# Patient Record
Sex: Male | Born: 1965 | Race: Black or African American | Hispanic: No | Marital: Single | Smoking: Former smoker
Health system: Southern US, Community
[De-identification: ages and names within clinical notes are randomized; demographics above are authoritative.]

## PROBLEM LIST (undated history)

## (undated) DIAGNOSIS — R358 Other polyuria: Principal | ICD-10-CM

## (undated) DIAGNOSIS — E1165 Type 2 diabetes mellitus with hyperglycemia: Secondary | ICD-10-CM

## (undated) DIAGNOSIS — B20 Human immunodeficiency virus [HIV] disease: Secondary | ICD-10-CM

## (undated) DIAGNOSIS — I10 Essential (primary) hypertension: Secondary | ICD-10-CM

## (undated) DIAGNOSIS — R42 Dizziness and giddiness: Secondary | ICD-10-CM

## (undated) DIAGNOSIS — H6690 Otitis media, unspecified, unspecified ear: Secondary | ICD-10-CM

## (undated) DIAGNOSIS — E119 Type 2 diabetes mellitus without complications: Secondary | ICD-10-CM

## (undated) DIAGNOSIS — T7840XA Allergy, unspecified, initial encounter: Secondary | ICD-10-CM

## (undated) DIAGNOSIS — R6889 Other general symptoms and signs: Secondary | ICD-10-CM

## (undated) DIAGNOSIS — R634 Abnormal weight loss: Secondary | ICD-10-CM

## (undated) DIAGNOSIS — G969 Disorder of central nervous system, unspecified: Secondary | ICD-10-CM

## (undated) HISTORY — DX: Human immunodeficiency virus (HIV) disease: B20

## (undated) HISTORY — PX: OTHER SURGICAL HISTORY: SHX169

## (undated) HISTORY — DX: Dizziness and giddiness: R42

## (undated) HISTORY — DX: Otitis media, unspecified, unspecified ear: H66.90

## (undated) HISTORY — DX: Essential (primary) hypertension: I10

## (undated) HISTORY — DX: Other general symptoms and signs: R68.89

## (undated) HISTORY — DX: Type 2 diabetes mellitus with hyperglycemia: E11.65

## (undated) HISTORY — DX: Type 2 diabetes mellitus without complications: E11.9

## (undated) HISTORY — DX: Disorder of central nervous system, unspecified: G96.9

## (undated) HISTORY — DX: Other polyuria: R35.8

## (undated) HISTORY — DX: Allergy, unspecified, initial encounter: T78.40XA

## (undated) HISTORY — PX: CARPAL TUNNEL RELEASE: SHX101

## (undated) HISTORY — DX: Abnormal weight loss: R63.4

---

## 1998-11-29 ENCOUNTER — Emergency Department (HOSPITAL_COMMUNITY): Admission: EM | Admit: 1998-11-29 | Discharge: 1998-11-29 | Payer: Self-pay | Admitting: Emergency Medicine

## 1999-05-14 ENCOUNTER — Emergency Department (HOSPITAL_COMMUNITY): Admission: EM | Admit: 1999-05-14 | Discharge: 1999-05-14 | Payer: Self-pay | Admitting: *Deleted

## 2004-12-18 ENCOUNTER — Emergency Department (HOSPITAL_COMMUNITY): Admission: EM | Admit: 2004-12-18 | Discharge: 2004-12-18 | Payer: Self-pay | Admitting: Emergency Medicine

## 2005-11-24 ENCOUNTER — Emergency Department (HOSPITAL_COMMUNITY): Admission: EM | Admit: 2005-11-24 | Discharge: 2005-11-24 | Payer: Self-pay | Admitting: Emergency Medicine

## 2007-07-30 ENCOUNTER — Emergency Department (HOSPITAL_COMMUNITY): Admission: EM | Admit: 2007-07-30 | Discharge: 2007-07-30 | Payer: Self-pay | Admitting: Emergency Medicine

## 2007-10-02 DIAGNOSIS — Z21 Asymptomatic human immunodeficiency virus [HIV] infection status: Secondary | ICD-10-CM

## 2007-10-02 DIAGNOSIS — I10 Essential (primary) hypertension: Secondary | ICD-10-CM

## 2007-10-02 DIAGNOSIS — B20 Human immunodeficiency virus [HIV] disease: Secondary | ICD-10-CM

## 2007-10-02 HISTORY — DX: Human immunodeficiency virus (HIV) disease: B20

## 2007-10-02 HISTORY — DX: Essential (primary) hypertension: I10

## 2007-10-02 HISTORY — DX: Asymptomatic human immunodeficiency virus (hiv) infection status: Z21

## 2007-11-26 ENCOUNTER — Emergency Department (HOSPITAL_COMMUNITY): Admission: EM | Admit: 2007-11-26 | Discharge: 2007-11-26 | Payer: Self-pay | Admitting: Family Medicine

## 2008-03-06 ENCOUNTER — Emergency Department (HOSPITAL_COMMUNITY): Admission: EM | Admit: 2008-03-06 | Discharge: 2008-03-06 | Payer: Self-pay | Admitting: Emergency Medicine

## 2008-03-09 ENCOUNTER — Emergency Department (HOSPITAL_COMMUNITY): Admission: EM | Admit: 2008-03-09 | Discharge: 2008-03-10 | Payer: Self-pay | Admitting: Emergency Medicine

## 2008-03-12 DIAGNOSIS — N179 Acute kidney failure, unspecified: Secondary | ICD-10-CM | POA: Insufficient documentation

## 2008-03-13 ENCOUNTER — Inpatient Hospital Stay (HOSPITAL_COMMUNITY): Admission: EM | Admit: 2008-03-13 | Discharge: 2008-03-26 | Payer: Self-pay | Admitting: Emergency Medicine

## 2008-03-13 ENCOUNTER — Ambulatory Visit: Payer: Self-pay | Admitting: Infectious Diseases

## 2008-03-13 ENCOUNTER — Encounter: Payer: Self-pay | Admitting: Infectious Diseases

## 2008-03-13 DIAGNOSIS — E8770 Fluid overload, unspecified: Secondary | ICD-10-CM | POA: Insufficient documentation

## 2008-03-13 DIAGNOSIS — J189 Pneumonia, unspecified organism: Secondary | ICD-10-CM | POA: Insufficient documentation

## 2008-03-13 DIAGNOSIS — E871 Hypo-osmolality and hyponatremia: Secondary | ICD-10-CM | POA: Insufficient documentation

## 2008-03-13 LAB — CONVERTED CEMR LAB: HIV: REACTIVE

## 2008-03-14 LAB — CONVERTED CEMR LAB: CD4 Count: 20 microliters

## 2008-03-24 ENCOUNTER — Ambulatory Visit: Payer: Self-pay | Admitting: Oncology

## 2008-03-25 LAB — CONVERTED CEMR LAB: HIV 1 RNA Quant: 969000 copies/mL

## 2008-03-26 LAB — CONVERTED CEMR LAB
GC Probe Amp, Urine: NEGATIVE
HCV Ab: NEGATIVE

## 2008-04-04 ENCOUNTER — Emergency Department (HOSPITAL_COMMUNITY): Admission: EM | Admit: 2008-04-04 | Discharge: 2008-04-05 | Payer: Self-pay | Admitting: Emergency Medicine

## 2008-04-07 ENCOUNTER — Encounter (INDEPENDENT_AMBULATORY_CARE_PROVIDER_SITE_OTHER): Payer: Self-pay | Admitting: *Deleted

## 2008-04-08 ENCOUNTER — Ambulatory Visit: Payer: Self-pay | Admitting: Infectious Disease

## 2008-04-08 LAB — CONVERTED CEMR LAB: CD4 T Cell Abs: 20

## 2008-04-15 DIAGNOSIS — B20 Human immunodeficiency virus [HIV] disease: Secondary | ICD-10-CM | POA: Insufficient documentation

## 2008-04-19 ENCOUNTER — Encounter: Payer: Self-pay | Admitting: Infectious Diseases

## 2008-04-20 ENCOUNTER — Ambulatory Visit: Payer: Self-pay | Admitting: Infectious Diseases

## 2008-04-20 DIAGNOSIS — I10 Essential (primary) hypertension: Secondary | ICD-10-CM

## 2008-04-20 DIAGNOSIS — I152 Hypertension secondary to endocrine disorders: Secondary | ICD-10-CM | POA: Insufficient documentation

## 2008-04-20 DIAGNOSIS — D709 Neutropenia, unspecified: Secondary | ICD-10-CM | POA: Insufficient documentation

## 2008-04-20 DIAGNOSIS — E1159 Type 2 diabetes mellitus with other circulatory complications: Secondary | ICD-10-CM

## 2008-04-20 LAB — CONVERTED CEMR LAB
ALT: 13 units/L (ref 0–53)
AST: 19 units/L (ref 0–37)
Albumin: 3.9 g/dL (ref 3.5–5.2)
BUN: 7 mg/dL (ref 6–23)
CO2: 24 meq/L (ref 19–32)
Calcium: 9.1 mg/dL (ref 8.4–10.5)
Chloride: 103 meq/L (ref 96–112)
Creatinine, Ser: 1.04 mg/dL (ref 0.40–1.50)
Hemoglobin: 11.6 g/dL — ABNORMAL LOW (ref 13.0–17.0)
Hep A Total Ab: NEGATIVE
Lymphocytes Relative: 29 % (ref 12–46)
Lymphs Abs: 0.6 10*3/uL — ABNORMAL LOW (ref 0.7–4.0)
Monocytes Absolute: 0.4 10*3/uL (ref 0.1–1.0)
Monocytes Relative: 19 % — ABNORMAL HIGH (ref 3–12)
Neutro Abs: 0.6 10*3/uL — ABNORMAL LOW (ref 1.7–7.7)
Neutrophils Relative %: 34 % — ABNORMAL LOW (ref 43–77)
Potassium: 4.3 meq/L (ref 3.5–5.3)
RBC: 4.28 M/uL (ref 4.22–5.81)
WBC: 1.9 10*3/uL — ABNORMAL LOW (ref 4.0–10.5)

## 2008-04-23 ENCOUNTER — Telehealth (INDEPENDENT_AMBULATORY_CARE_PROVIDER_SITE_OTHER): Payer: Self-pay | Admitting: *Deleted

## 2008-04-27 ENCOUNTER — Ambulatory Visit: Payer: Self-pay | Admitting: Infectious Diseases

## 2008-04-27 LAB — CONVERTED CEMR LAB
ALT: 10 units/L (ref 0–53)
BUN: 5 mg/dL — ABNORMAL LOW (ref 6–23)
Bilirubin, Direct: 0.1 mg/dL (ref 0.0–0.3)
CD4 Count: 33 microliters
Chloride: 105 meq/L (ref 96–112)
Creatinine, Ser: 0.85 mg/dL (ref 0.40–1.50)
HIV-1 RNA Quant, Log: 5.8 — ABNORMAL HIGH (ref <1.70)
Hep B S Ab: NEGATIVE
Indirect Bilirubin: 0.5 mg/dL (ref 0.0–0.9)
Phosphorus: 4.1 mg/dL (ref 2.3–4.6)
Potassium: 4.1 meq/L (ref 3.5–5.3)
Total Bilirubin: 0.6 mg/dL (ref 0.3–1.2)

## 2008-05-05 ENCOUNTER — Observation Stay (HOSPITAL_COMMUNITY): Admission: EM | Admit: 2008-05-05 | Discharge: 2008-05-07 | Payer: Self-pay | Admitting: Emergency Medicine

## 2008-05-05 ENCOUNTER — Telehealth: Payer: Self-pay | Admitting: Infectious Diseases

## 2008-05-05 ENCOUNTER — Ambulatory Visit: Payer: Self-pay | Admitting: Internal Medicine

## 2008-05-06 ENCOUNTER — Ambulatory Visit: Payer: Self-pay | Admitting: Internal Medicine

## 2008-05-12 ENCOUNTER — Ambulatory Visit: Payer: Self-pay | Admitting: Infectious Diseases

## 2008-05-12 LAB — CONVERTED CEMR LAB
CD4 Count: 30 microliters
Calcium: 9.2 mg/dL (ref 8.4–10.5)
Cholesterol: 158 mg/dL (ref 0–200)
Glucose, Bld: 89 mg/dL (ref 70–99)
HDL: 29 mg/dL — ABNORMAL LOW (ref >39)
Potassium: 4.1 meq/L (ref 3.5–5.3)
Sodium: 141 meq/L (ref 135–145)
Total CHOL/HDL Ratio: 5.4
Triglycerides: 220 mg/dL — ABNORMAL HIGH (ref <150)
VLDL: 44 mg/dL — ABNORMAL HIGH (ref 0–40)

## 2008-05-13 ENCOUNTER — Encounter: Payer: Self-pay | Admitting: Infectious Diseases

## 2008-05-18 ENCOUNTER — Ambulatory Visit: Payer: Self-pay | Admitting: Infectious Diseases

## 2008-05-18 LAB — CONVERTED CEMR LAB
ALT: 11 units/L (ref 0–53)
AST: 13 units/L (ref 0–37)
Albumin: 4.1 g/dL (ref 3.5–5.2)
Alkaline Phosphatase: 61 units/L (ref 39–117)
BUN: 8 mg/dL (ref 6–23)
Bilirubin Urine: NEGATIVE
Bilirubin, Direct: 0.1 mg/dL (ref 0.0–0.3)
CD4 Count: 17 microliters
CO2: 26 meq/L (ref 19–32)
Calcium: 8.9 mg/dL (ref 8.4–10.5)
Chloride: 104 meq/L (ref 96–112)
Creatinine, Ser: 0.83 mg/dL (ref 0.40–1.50)
Glucose, Bld: 90 mg/dL (ref 70–99)
HIV 1 RNA Quant: 579162 copies/mL
Hemoglobin, Urine: NEGATIVE
Indirect Bilirubin: 0.3 mg/dL (ref 0.0–0.9)
Ketones, ur: NEGATIVE mg/dL
Leukocytes, UA: NEGATIVE
Nitrite: NEGATIVE
Phosphorus: 3.8 mg/dL (ref 2.3–4.6)
Potassium: 4.3 meq/L (ref 3.5–5.3)
Protein, ur: NEGATIVE mg/dL
Sodium: 141 meq/L (ref 135–145)
Specific Gravity, Urine: 1.021 (ref 1.005–1.03)
Total Bilirubin: 0.4 mg/dL (ref 0.3–1.2)
Total Protein: 7.6 g/dL (ref 6.0–8.3)
Urine Glucose: NEGATIVE mg/dL
Urobilinogen, UA: 1 (ref 0.0–1.0)
pH: 6 (ref 5.0–8.0)

## 2008-05-20 ENCOUNTER — Telehealth (INDEPENDENT_AMBULATORY_CARE_PROVIDER_SITE_OTHER): Payer: Self-pay | Admitting: *Deleted

## 2008-05-27 ENCOUNTER — Ambulatory Visit: Payer: Self-pay | Admitting: Infectious Diseases

## 2008-06-14 ENCOUNTER — Telehealth: Payer: Self-pay | Admitting: Infectious Diseases

## 2008-06-16 ENCOUNTER — Ambulatory Visit: Payer: Self-pay | Admitting: Infectious Diseases

## 2008-06-16 LAB — CONVERTED CEMR LAB
AST: 14 units/L (ref 0–37)
BUN: 10 mg/dL (ref 6–23)
Bilirubin, Direct: 0.1 mg/dL (ref 0.0–0.3)
Indirect Bilirubin: 0.3 mg/dL (ref 0.0–0.9)
Phosphorus: 3.7 mg/dL (ref 2.3–4.6)
Potassium: 4.4 meq/L (ref 3.5–5.3)
Total Bilirubin: 0.4 mg/dL (ref 0.3–1.2)

## 2008-06-17 ENCOUNTER — Telehealth (INDEPENDENT_AMBULATORY_CARE_PROVIDER_SITE_OTHER): Payer: Self-pay | Admitting: *Deleted

## 2008-07-14 ENCOUNTER — Telehealth (INDEPENDENT_AMBULATORY_CARE_PROVIDER_SITE_OTHER): Payer: Self-pay | Admitting: *Deleted

## 2008-07-14 ENCOUNTER — Ambulatory Visit: Payer: Self-pay | Admitting: Infectious Diseases

## 2008-07-14 LAB — CONVERTED CEMR LAB
Albumin: 4.3 g/dL (ref 3.5–5.2)
Alkaline Phosphatase: 71 units/L (ref 39–117)
Basophils Absolute: 0.1 10*3/uL (ref 0.0–0.1)
Basophils Relative: 1 % (ref 0–1)
Bilirubin, Direct: 0.1 mg/dL (ref 0.0–0.3)
Calcium: 9.4 mg/dL (ref 8.4–10.5)
Eosinophils Absolute: 1.1 10*3/uL — ABNORMAL HIGH (ref 0.0–0.7)
Eosinophils Relative: 17 % — ABNORMAL HIGH (ref 0–5)
HCT: 43.1 % (ref 39.0–52.0)
Indirect Bilirubin: 0.3 mg/dL (ref 0.0–0.9)
MCV: 91.7 fL (ref 78.0–100.0)
Platelets: 298 10*3/uL (ref 150–400)
RDW: 15.4 % (ref 11.5–15.5)
Sodium: 138 meq/L (ref 135–145)
Total Bilirubin: 0.4 mg/dL (ref 0.3–1.2)

## 2008-08-12 ENCOUNTER — Telehealth (INDEPENDENT_AMBULATORY_CARE_PROVIDER_SITE_OTHER): Payer: Self-pay | Admitting: *Deleted

## 2008-08-24 ENCOUNTER — Ambulatory Visit: Payer: Self-pay | Admitting: Infectious Diseases

## 2008-08-24 ENCOUNTER — Encounter: Payer: Self-pay | Admitting: Infectious Diseases

## 2008-08-24 LAB — CONVERTED CEMR LAB
AST: 14 units/L (ref 0–37)
Albumin: 4.6 g/dL (ref 3.5–5.2)
Alkaline Phosphatase: 63 units/L (ref 39–117)
CD4 Count: 167 microliters
CO2: 24 meq/L (ref 19–32)
Creatinine, Urine: 184.8 mg/dL
Glucose, Bld: 89 mg/dL (ref 70–99)
HIV 1 RNA Quant: 117 copies/mL
Hep B S Ab: NEGATIVE
Potassium: 4.7 meq/L (ref 3.5–5.3)
Sodium: 139 meq/L (ref 135–145)
Total Bilirubin: 0.5 mg/dL (ref 0.3–1.2)
Total Protein, Urine: 7

## 2008-08-25 ENCOUNTER — Ambulatory Visit: Payer: Self-pay | Admitting: Infectious Diseases

## 2008-08-25 LAB — CONVERTED CEMR LAB
Basophils Absolute: 0 10*3/uL (ref 0.0–0.1)
Cholesterol: 193 mg/dL (ref 0–200)
HDL: 28 mg/dL — ABNORMAL LOW (ref >39)
Hemoglobin: 15.3 g/dL (ref 13.0–17.0)
Lymphocytes Relative: 66 % — ABNORMAL HIGH (ref 12–46)
Monocytes Absolute: 0.3 10*3/uL (ref 0.1–1.0)
Neutro Abs: 0.5 10*3/uL — ABNORMAL LOW (ref 1.7–7.7)
Neutrophils Relative %: 15 % — ABNORMAL LOW (ref 43–77)
Platelets: 237 10*3/uL (ref 150–400)
RDW: 14.7 % (ref 11.5–15.5)
Total CHOL/HDL Ratio: 6.9
Triglycerides: 229 mg/dL — ABNORMAL HIGH (ref <150)

## 2008-09-01 ENCOUNTER — Ambulatory Visit: Payer: Self-pay | Admitting: Infectious Diseases

## 2008-09-01 LAB — CONVERTED CEMR LAB
Basophils Relative: 1 % (ref 0–1)
Eosinophils Absolute: 0.5 10*3/uL (ref 0.0–0.7)
Eosinophils Relative: 13 % — ABNORMAL HIGH (ref 0–5)
MCHC: 34.6 g/dL (ref 30.0–36.0)
MCV: 91.9 fL (ref 78.0–100.0)
Neutrophils Relative %: 25 % — ABNORMAL LOW (ref 43–77)
Platelets: 267 10*3/uL (ref 150–400)

## 2008-09-09 ENCOUNTER — Encounter: Payer: Self-pay | Admitting: Infectious Diseases

## 2008-09-10 ENCOUNTER — Encounter: Payer: Self-pay | Admitting: *Deleted

## 2008-09-10 ENCOUNTER — Telehealth: Payer: Self-pay | Admitting: Infectious Diseases

## 2008-09-14 ENCOUNTER — Telehealth (INDEPENDENT_AMBULATORY_CARE_PROVIDER_SITE_OTHER): Payer: Self-pay | Admitting: *Deleted

## 2008-09-15 ENCOUNTER — Ambulatory Visit: Payer: Self-pay | Admitting: Infectious Diseases

## 2008-09-15 LAB — CONVERTED CEMR LAB
Basophils Absolute: 0.1 10*3/uL (ref 0.0–0.1)
Eosinophils Absolute: 0.3 10*3/uL (ref 0.0–0.7)
Lymphocytes Relative: 56 % — ABNORMAL HIGH (ref 12–46)
Lymphs Abs: 1.9 10*3/uL (ref 0.7–4.0)
MCV: 94.5 fL (ref 78.0–100.0)
Neutrophils Relative %: 23 % — ABNORMAL LOW (ref 43–77)
Platelets: 295 10*3/uL (ref 150–400)
RDW: 15 % (ref 11.5–15.5)
WBC: 3.3 10*3/uL — ABNORMAL LOW (ref 4.0–10.5)

## 2008-09-28 ENCOUNTER — Ambulatory Visit: Payer: Self-pay | Admitting: Infectious Diseases

## 2008-09-28 LAB — CONVERTED CEMR LAB
Basophils Absolute: 0 10*3/uL (ref 0.0–0.1)
Basophils Relative: 1 % (ref 0–1)
Hemoglobin: 15.4 g/dL (ref 13.0–17.0)
Lymphocytes Relative: 59 % — ABNORMAL HIGH (ref 12–46)
Monocytes Absolute: 0.5 10*3/uL (ref 0.1–1.0)
Neutro Abs: 0.5 10*3/uL — ABNORMAL LOW (ref 1.7–7.7)
Neutrophils Relative %: 15 % — ABNORMAL LOW (ref 43–77)
RDW: 14.8 % (ref 11.5–15.5)

## 2008-10-04 ENCOUNTER — Ambulatory Visit: Payer: Self-pay | Admitting: Internal Medicine

## 2008-10-04 ENCOUNTER — Ambulatory Visit: Payer: Self-pay | Admitting: Infectious Diseases

## 2008-10-04 LAB — CONVERTED CEMR LAB
Basophils Absolute: 0.1 10*3/uL
Basophils Relative: 2 % — ABNORMAL HIGH
Eosinophils Absolute: 0.3 10*3/uL
Eosinophils Relative: 10 % — ABNORMAL HIGH
HCT: 47.1 %
Hemoglobin: 15.2 g/dL
Lymphocytes Relative: 53 % — ABNORMAL HIGH
Lymphs Abs: 1.6 10*3/uL
MCHC: 32.3 g/dL
MCV: 94.4 fL
Monocytes Absolute: 0.4 10*3/uL
Monocytes Relative: 14 % — ABNORMAL HIGH
Neutro Abs: 0.7 10*3/uL — ABNORMAL LOW
Neutrophils Relative %: 21 % — ABNORMAL LOW
Platelets: 281 10*3/uL
RBC: 4.99 M/uL
RDW: 14.9 %
WBC: 3.1 10*3/uL — ABNORMAL LOW

## 2008-10-11 ENCOUNTER — Ambulatory Visit: Payer: Self-pay | Admitting: Infectious Diseases

## 2008-10-11 LAB — CONVERTED CEMR LAB
Basophils Absolute: 0 10*3/uL (ref 0.0–0.1)
Eosinophils Absolute: 0 10*3/uL (ref 0.0–0.7)
Eosinophils Relative: 2 % (ref 0–5)
HCT: 46.9 % (ref 39.0–52.0)
Hemoglobin: 15.3 g/dL (ref 13.0–17.0)
Lymphocytes Relative: 62 % — ABNORMAL HIGH (ref 12–46)
MCV: 93.1 fL (ref 78.0–100.0)
Monocytes Absolute: 0.3 10*3/uL (ref 0.1–1.0)
Platelets: 190 10*3/uL (ref 150–400)
RDW: 14.9 % (ref 11.5–15.5)

## 2008-10-14 ENCOUNTER — Encounter: Payer: Self-pay | Admitting: Infectious Diseases

## 2008-10-15 ENCOUNTER — Telehealth (INDEPENDENT_AMBULATORY_CARE_PROVIDER_SITE_OTHER): Payer: Self-pay | Admitting: *Deleted

## 2008-10-15 ENCOUNTER — Ambulatory Visit: Payer: Self-pay | Admitting: Infectious Diseases

## 2008-10-15 LAB — CONVERTED CEMR LAB
ALT: 22 units/L (ref 0–53)
AST: 54 units/L — ABNORMAL HIGH (ref 0–37)
Alkaline Phosphatase: 65 units/L (ref 39–117)
Bilirubin, Direct: 0.1 mg/dL (ref 0.0–0.3)
CD4 Count: 93 microliters
CO2: 26 meq/L (ref 19–32)
Calcium: 9.3 mg/dL (ref 8.4–10.5)
Cholesterol: 146 mg/dL (ref 0–200)
Creatinine, Ser: 1.03 mg/dL (ref 0.40–1.50)
Glucose, Bld: 85 mg/dL (ref 70–99)
HDL: 26 mg/dL — ABNORMAL LOW (ref >39)
Sodium: 144 meq/L (ref 135–145)
Total Bilirubin: 0.7 mg/dL (ref 0.3–1.2)
Total CHOL/HDL Ratio: 5.6

## 2008-10-18 ENCOUNTER — Telehealth (INDEPENDENT_AMBULATORY_CARE_PROVIDER_SITE_OTHER): Payer: Self-pay | Admitting: *Deleted

## 2008-11-03 ENCOUNTER — Ambulatory Visit: Payer: Self-pay | Admitting: Infectious Diseases

## 2008-11-03 ENCOUNTER — Encounter (INDEPENDENT_AMBULATORY_CARE_PROVIDER_SITE_OTHER): Payer: Self-pay | Admitting: *Deleted

## 2008-11-03 ENCOUNTER — Encounter: Payer: Self-pay | Admitting: Infectious Diseases

## 2008-11-03 LAB — CONVERTED CEMR LAB
AST: 11 units/L (ref 0–37)
Alkaline Phosphatase: 70 units/L (ref 39–117)
BUN: 9 mg/dL (ref 6–23)
Bilirubin, Direct: 0.2 mg/dL (ref 0.0–0.3)
CD4 Count: 157 microliters
CO2: 23 meq/L (ref 19–32)
Calcium: 9.4 mg/dL (ref 8.4–10.5)
Chloride: 103 meq/L (ref 96–112)
Creatinine, Ser: 0.99 mg/dL (ref 0.40–1.50)
Indirect Bilirubin: 0.6 mg/dL (ref 0.0–0.9)
LDL Cholesterol: 100 mg/dL — ABNORMAL HIGH (ref 0–99)
Total Bilirubin: 0.8 mg/dL (ref 0.3–1.2)

## 2008-11-12 ENCOUNTER — Encounter: Payer: Self-pay | Admitting: *Deleted

## 2008-11-15 ENCOUNTER — Telehealth (INDEPENDENT_AMBULATORY_CARE_PROVIDER_SITE_OTHER): Payer: Self-pay | Admitting: *Deleted

## 2008-11-30 ENCOUNTER — Ambulatory Visit: Payer: Self-pay | Admitting: Infectious Diseases

## 2008-11-30 DIAGNOSIS — M25519 Pain in unspecified shoulder: Secondary | ICD-10-CM | POA: Insufficient documentation

## 2008-12-08 ENCOUNTER — Encounter: Admission: RE | Admit: 2008-12-08 | Discharge: 2009-01-06 | Payer: Self-pay | Admitting: Infectious Diseases

## 2008-12-09 ENCOUNTER — Telehealth (INDEPENDENT_AMBULATORY_CARE_PROVIDER_SITE_OTHER): Payer: Self-pay | Admitting: *Deleted

## 2008-12-14 ENCOUNTER — Encounter: Payer: Self-pay | Admitting: Infectious Diseases

## 2008-12-17 ENCOUNTER — Ambulatory Visit: Payer: Self-pay | Admitting: Infectious Diseases

## 2008-12-17 ENCOUNTER — Encounter: Payer: Self-pay | Admitting: Infectious Diseases

## 2008-12-17 LAB — CONVERTED CEMR LAB: HIV 1 RNA Quant: 118 copies/mL

## 2008-12-21 ENCOUNTER — Ambulatory Visit: Payer: Self-pay | Admitting: Internal Medicine

## 2009-01-17 ENCOUNTER — Encounter (INDEPENDENT_AMBULATORY_CARE_PROVIDER_SITE_OTHER): Payer: Self-pay | Admitting: *Deleted

## 2009-01-17 ENCOUNTER — Telehealth: Payer: Self-pay | Admitting: Infectious Diseases

## 2009-01-25 ENCOUNTER — Encounter: Payer: Self-pay | Admitting: Infectious Diseases

## 2009-01-26 ENCOUNTER — Ambulatory Visit: Payer: Self-pay | Admitting: Infectious Diseases

## 2009-01-26 LAB — CONVERTED CEMR LAB
ALT: 13 units/L (ref 0–53)
Bilirubin, Direct: 0.1 mg/dL (ref 0.0–0.3)
CO2: 26 meq/L (ref 19–32)
Chloride: 105 meq/L (ref 96–112)
Cholesterol: 158 mg/dL (ref 0–200)
Creatinine, Ser: 1.14 mg/dL (ref 0.40–1.50)
Phosphorus: 4.4 mg/dL (ref 2.3–4.6)
Total CHOL/HDL Ratio: 6.3
Total Protein: 7.4 g/dL (ref 6.0–8.3)
VLDL: 41 mg/dL — ABNORMAL HIGH (ref 0–40)

## 2009-02-07 ENCOUNTER — Encounter (INDEPENDENT_AMBULATORY_CARE_PROVIDER_SITE_OTHER): Payer: Self-pay | Admitting: *Deleted

## 2009-02-16 ENCOUNTER — Telehealth (INDEPENDENT_AMBULATORY_CARE_PROVIDER_SITE_OTHER): Payer: Self-pay | Admitting: *Deleted

## 2009-03-04 ENCOUNTER — Encounter: Payer: Self-pay | Admitting: *Deleted

## 2009-03-18 ENCOUNTER — Telehealth (INDEPENDENT_AMBULATORY_CARE_PROVIDER_SITE_OTHER): Payer: Self-pay | Admitting: *Deleted

## 2009-03-29 ENCOUNTER — Ambulatory Visit: Payer: Self-pay | Admitting: Infectious Diseases

## 2009-03-29 DIAGNOSIS — R635 Abnormal weight gain: Secondary | ICD-10-CM | POA: Insufficient documentation

## 2009-04-12 ENCOUNTER — Ambulatory Visit: Payer: Self-pay | Admitting: Internal Medicine

## 2009-04-18 ENCOUNTER — Encounter (INDEPENDENT_AMBULATORY_CARE_PROVIDER_SITE_OTHER): Payer: Self-pay | Admitting: *Deleted

## 2009-04-19 ENCOUNTER — Telehealth (INDEPENDENT_AMBULATORY_CARE_PROVIDER_SITE_OTHER): Payer: Self-pay | Admitting: *Deleted

## 2009-04-20 ENCOUNTER — Ambulatory Visit: Payer: Self-pay | Admitting: Infectious Diseases

## 2009-04-20 LAB — CONVERTED CEMR LAB
AST: 16 units/L (ref 0–37)
Alkaline Phosphatase: 76 units/L (ref 39–117)
BUN: 18 mg/dL (ref 6–23)
Bilirubin, Direct: 0.2 mg/dL (ref 0.0–0.3)
CD4 Count: 223 microliters
CO2: 27 meq/L (ref 19–32)
Calcium: 9.6 mg/dL (ref 8.4–10.5)
Creatinine, Ser: 1.22 mg/dL (ref 0.40–1.50)
Glucose, Bld: 88 mg/dL (ref 70–99)
Glucose, Urine, Semiquant: NEGATIVE
HIV 1 RNA Quant: 138 copies/mL
Nitrite: NEGATIVE
Protein, U semiquant: NEGATIVE
Total Bilirubin: 0.6 mg/dL (ref 0.3–1.2)
Total Protein, Urine: 8
Urobilinogen, UA: 0.2
WBC Urine, dipstick: NEGATIVE

## 2009-05-17 ENCOUNTER — Telehealth (INDEPENDENT_AMBULATORY_CARE_PROVIDER_SITE_OTHER): Payer: Self-pay | Admitting: *Deleted

## 2009-06-17 ENCOUNTER — Telehealth (INDEPENDENT_AMBULATORY_CARE_PROVIDER_SITE_OTHER): Payer: Self-pay | Admitting: *Deleted

## 2009-07-14 ENCOUNTER — Telehealth (INDEPENDENT_AMBULATORY_CARE_PROVIDER_SITE_OTHER): Payer: Self-pay | Admitting: *Deleted

## 2009-08-02 ENCOUNTER — Ambulatory Visit: Payer: Self-pay | Admitting: Infectious Diseases

## 2009-08-02 ENCOUNTER — Encounter: Payer: Self-pay | Admitting: Infectious Diseases

## 2009-08-02 LAB — CONVERTED CEMR LAB
ALT: 16 units/L (ref 0–53)
Albumin: 4.4 g/dL (ref 3.5–5.2)
Alkaline Phosphatase: 70 units/L (ref 39–117)
Chloride: 98 meq/L (ref 96–112)
Cholesterol: 200 mg/dL (ref 0–200)
Creatinine, Ser: 1.3 mg/dL (ref 0.40–1.50)
HDL: 24 mg/dL — ABNORMAL LOW (ref >39)
HIV 1 RNA Quant: 39 copies/mL
LDL Cholesterol: 136 mg/dL — ABNORMAL HIGH (ref 0–99)
Potassium: 3.6 meq/L (ref 3.5–5.3)
Total Protein: 8.1 g/dL (ref 6.0–8.3)
Triglycerides: 201 mg/dL — ABNORMAL HIGH (ref <150)
VLDL: 40 mg/dL (ref 0–40)

## 2009-08-09 ENCOUNTER — Telehealth (INDEPENDENT_AMBULATORY_CARE_PROVIDER_SITE_OTHER): Payer: Self-pay | Admitting: *Deleted

## 2009-09-06 ENCOUNTER — Telehealth (INDEPENDENT_AMBULATORY_CARE_PROVIDER_SITE_OTHER): Payer: Self-pay | Admitting: *Deleted

## 2009-09-30 IMAGING — CT CT HEAD W/O CM
1 series · 16 of 30 positions shown, 20 images · non-contrast
Comparison: None

CLINICAL DATA: Migraine

CT HEAD WITHOUT CONTRAST
TECHNIQUE: Contiguous axial images were obtained from the base of
the skull through the vertex without contrast.

[Series 2: brain · axial · 0.47mm/px · z∈[+146,+272]mm · 16 of 38 slices shown, 20 images]
[im 2/38  brain]
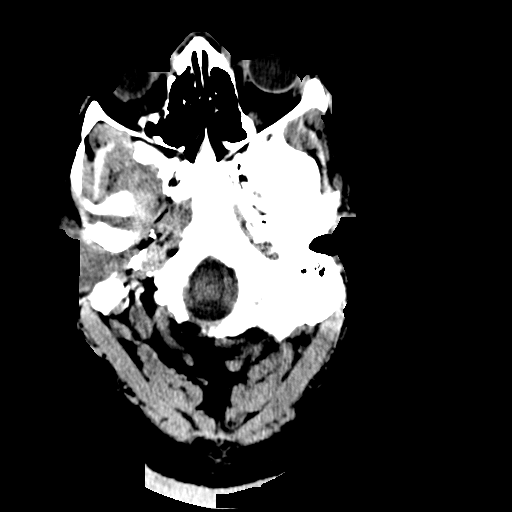
[im 2/38  bone]
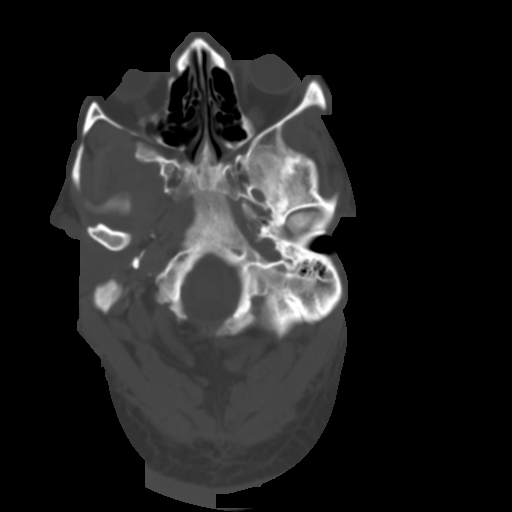
[im 4/38  brain]
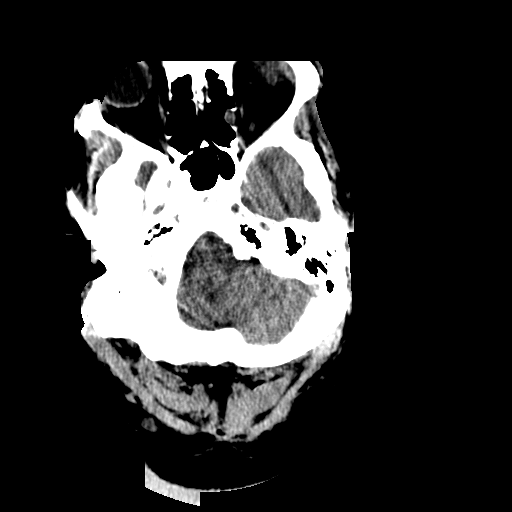
[im 7/38  brain]
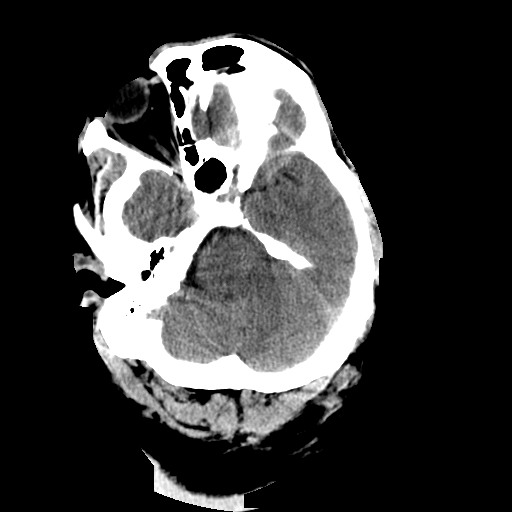
[im 9/38  brain]
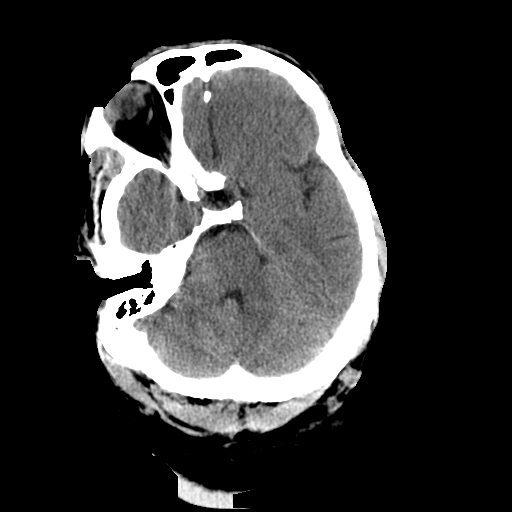
[im 11/38  brain]
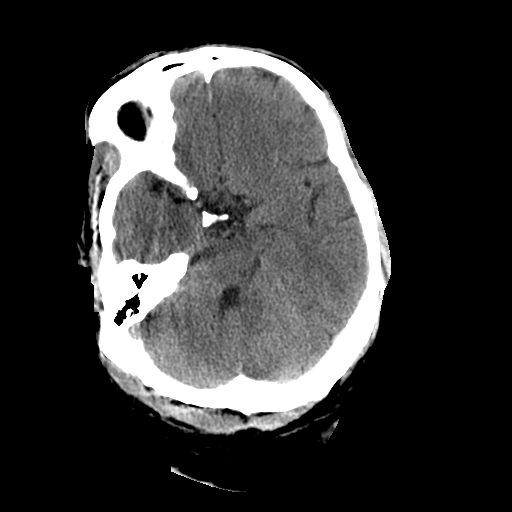
[im 11/38  bone]
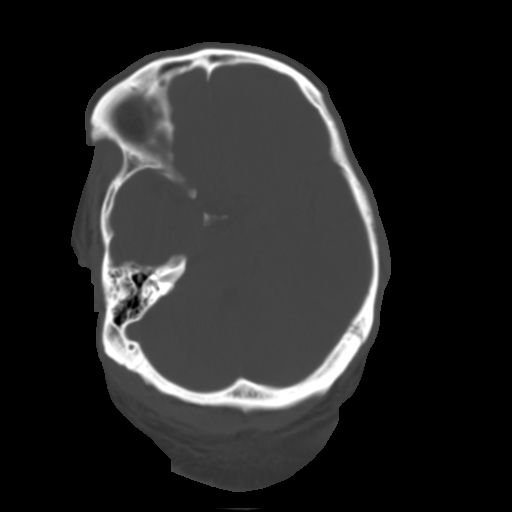
[im 13/38  brain]
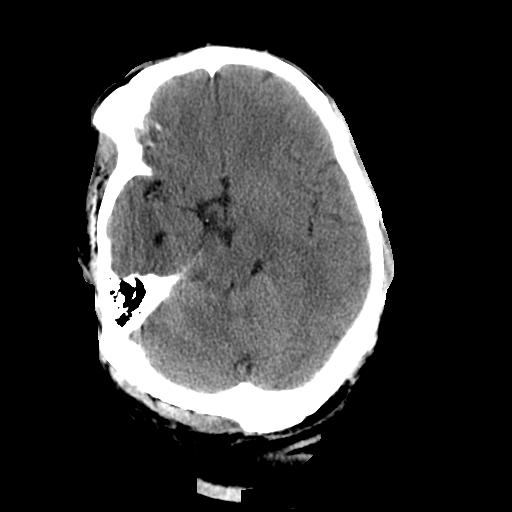
[im 16/38  brain]
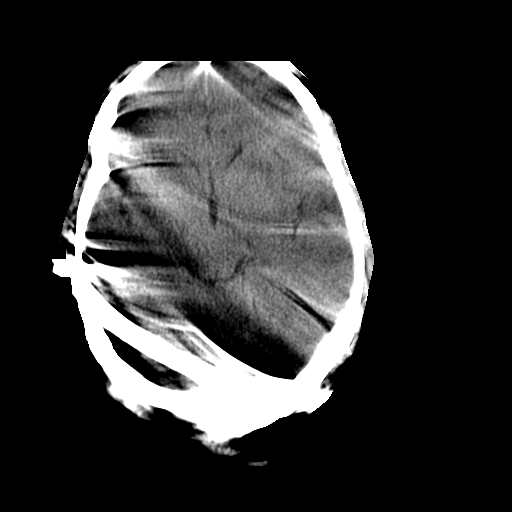
[im 18/38  brain]
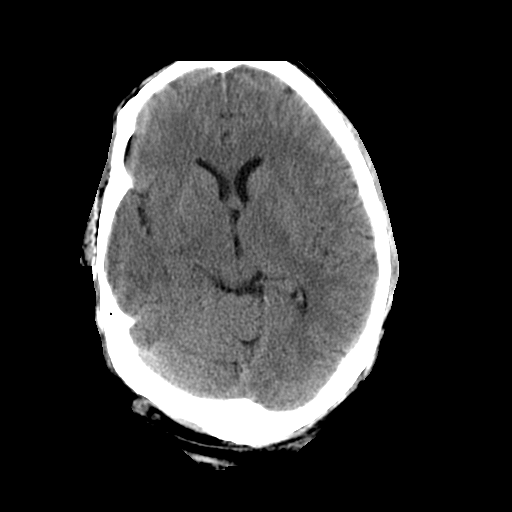
[im 20/38  brain]
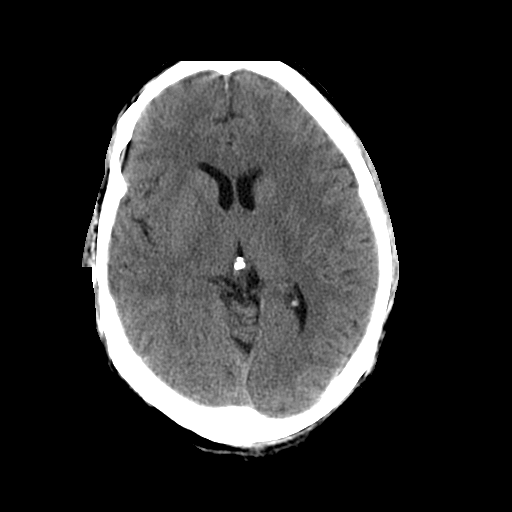
[im 20/38  bone]
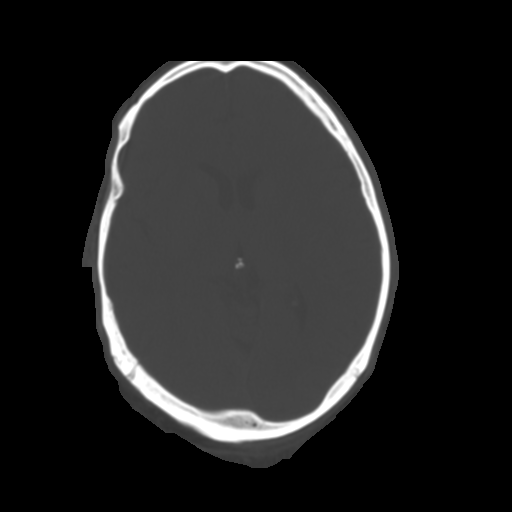
[im 22/38  brain]
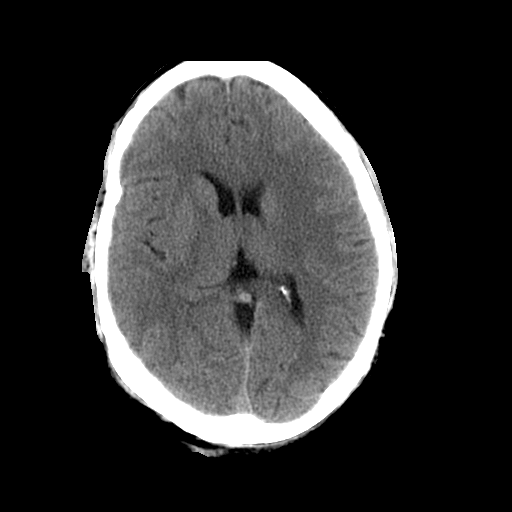
[im 25/38  brain]
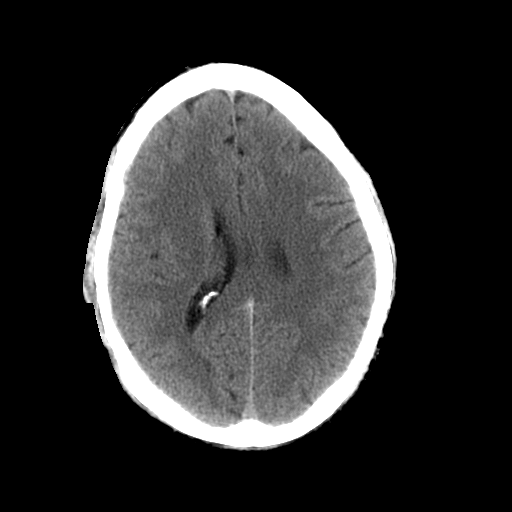
[im 27/38  brain]
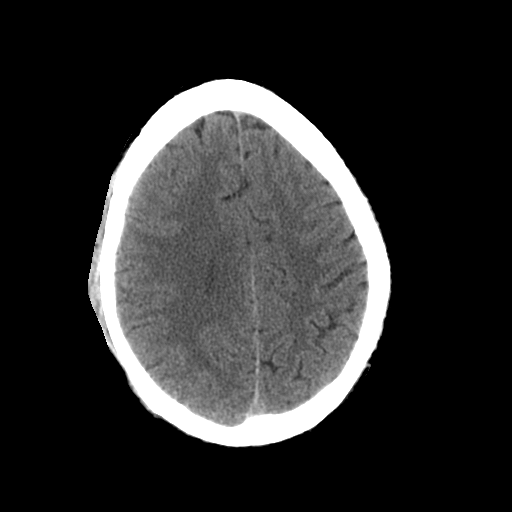
[im 29/38  brain]
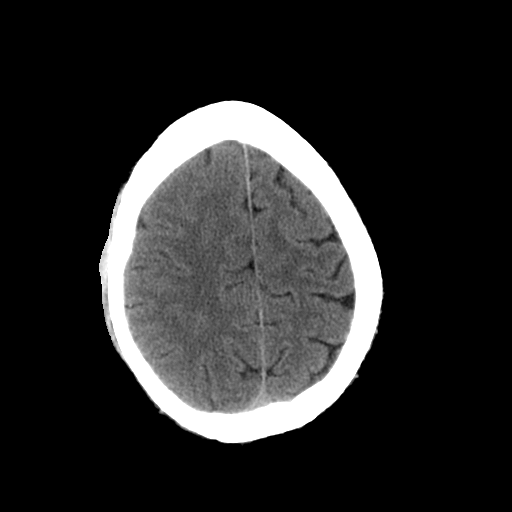
[im 29/38  bone]
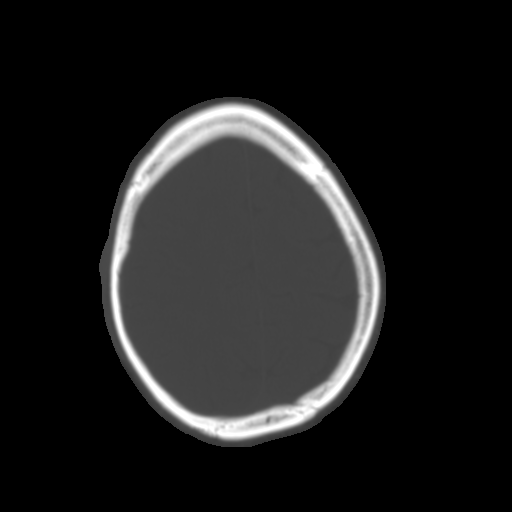
[im 31/38  brain]
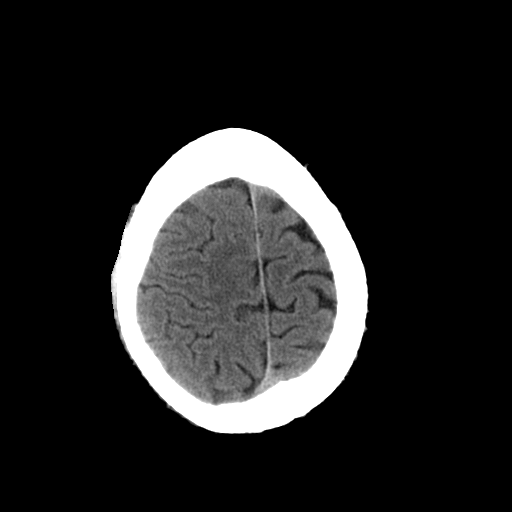
[im 34/38  brain]
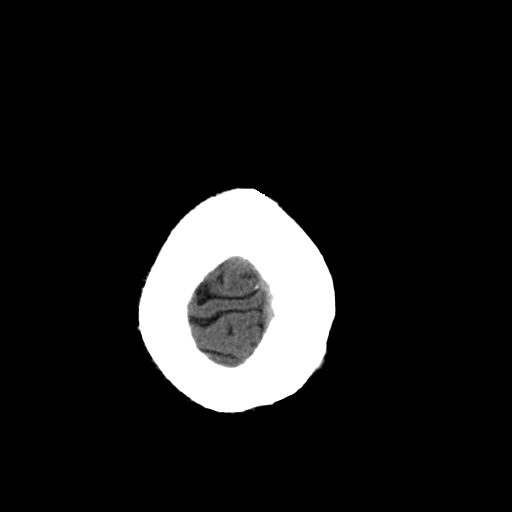
[im 36/38  brain]
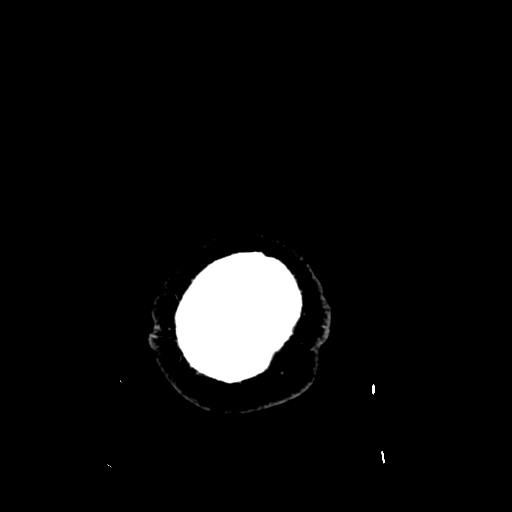

[16 of 30 positions shown; findings below may reference images not displayed]

FINDINGS: No acute intracranial abnormality.  Specifically, no
hemorrhage, hydrocephalus, mass lesion, acute infarct, extra-axial
fluid collection, or midline shift.  Visualized calvarium
unremarkable.
IMPRESSION: No acute intracranial abnormality.

## 2009-10-04 ENCOUNTER — Telehealth: Payer: Self-pay | Admitting: Infectious Diseases

## 2009-10-04 IMAGING — RF DG FLUORO GUIDE NDL PLC/BX
1 series · 1 of 1 positions shown · non-contrast
Comparison: None

CLINICAL DATA: Headache for 8 days, negative head CT 03/09/2008

FLUORO GUIDED LUMBAR PUNCTURE:

[Series 1: run · 1 of 1 slices shown]
[im 1/1]
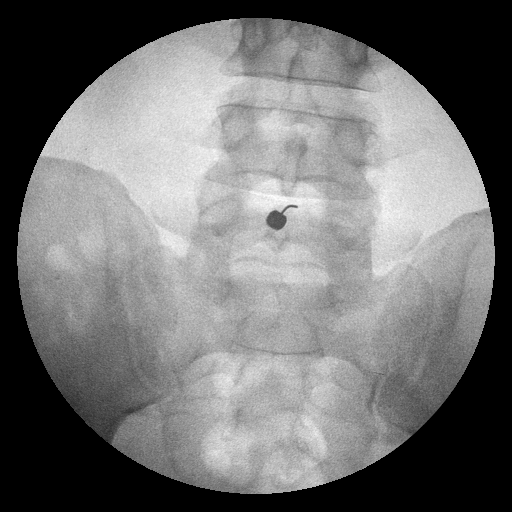

[1 of 1 positions shown; findings below may reference images not displayed]

Procedure:
Written informed consent for fluoroscopic-guided lumbar puncture
obtained.
Time out protocol followed.
Patient placed prone.
L4-L5 disc space localized under fluoroscopy.
Skin prepped and draped in usual sterile fashion.
Skin and soft tissues anesthetized with 2.5 ml of 1% lidocaine.
20 gauge spinal needle advanced into spinal canal under
fluoroscopic visualization.
Clear colorless CSF encountered with opening pressure 42 cm H2O..
10 ml of CSF obtained in four tubes for requested laboratory
analysis.
Procedure tolerated well by patient without immediate complication.
0.9 minutes fluoroscopy utilized.
IMPRESSION: Fluoroscopic-guided lumbar puncture as above.

## 2009-10-04 IMAGING — CT CT HEAD W/O CM
1 series · 16 of 30 positions shown, 20 images · non-contrast
Comparison: 03/09/2008

CLINICAL DATA: Viral meningitis, acute mental status change

CT HEAD WITHOUT CONTRAST
TECHNIQUE: Contiguous axial images were obtained from the base of
the skull through the vertex without contrast.

[Series 2: headseq 4.8 h45s · axial · 0.42mm/px · z∈[-131,-1]mm · 16 of 30 slices shown, 20 images]
[im 2/30  brain]
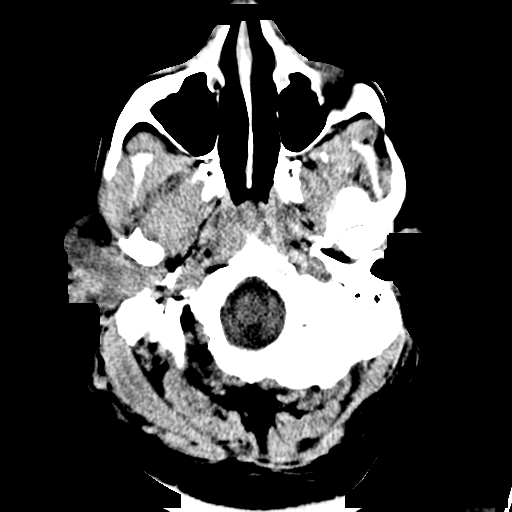
[im 2/30  bone]
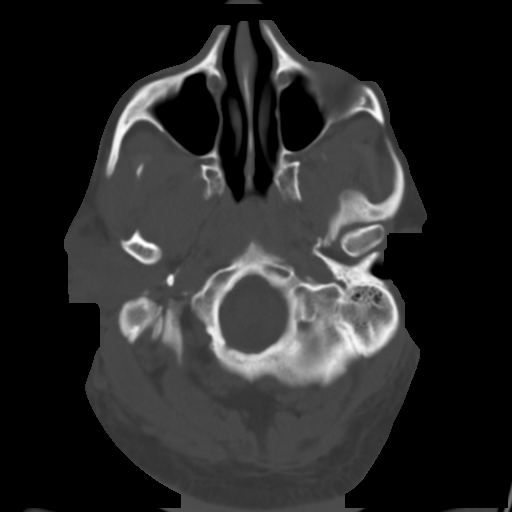
[im 4/30  brain]
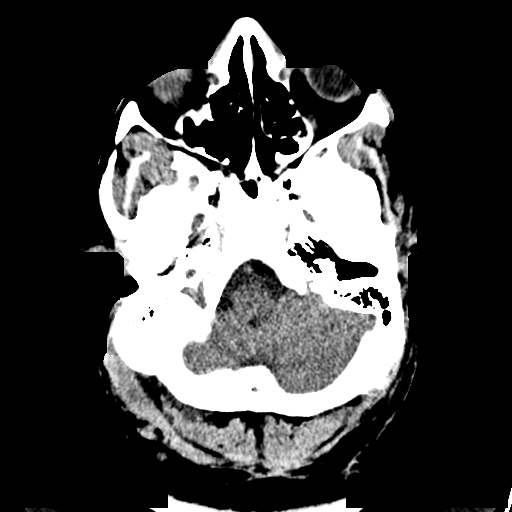
[im 6/30  brain]
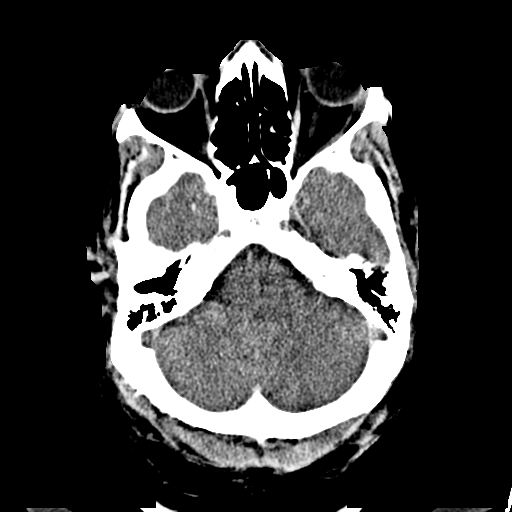
[im 8/30  brain]
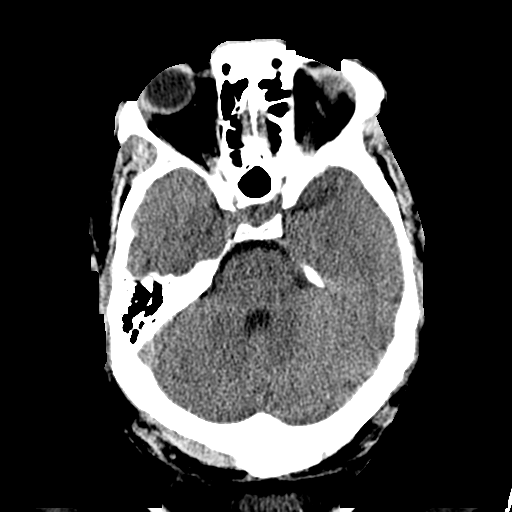
[im 9/30  brain]
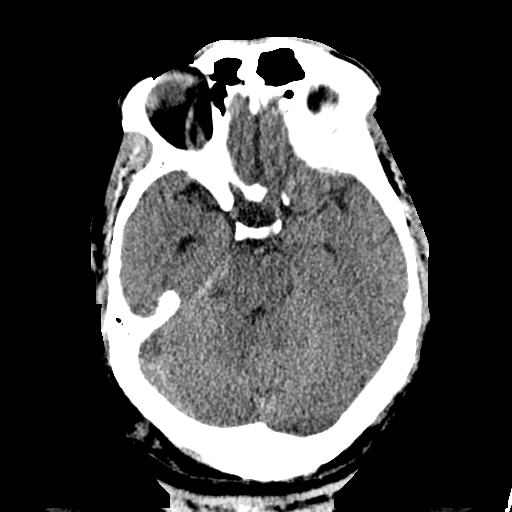
[im 9/30  bone]
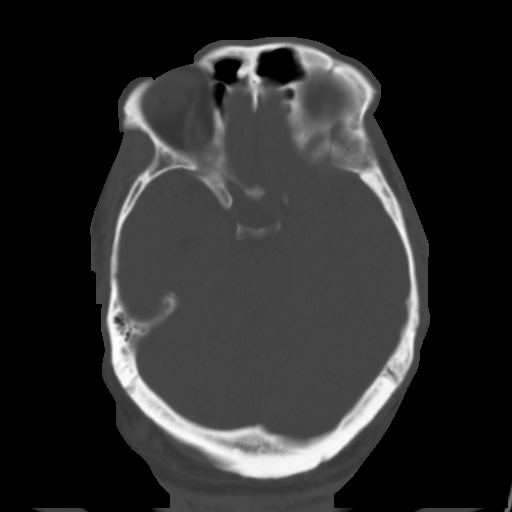
[im 11/30  brain]
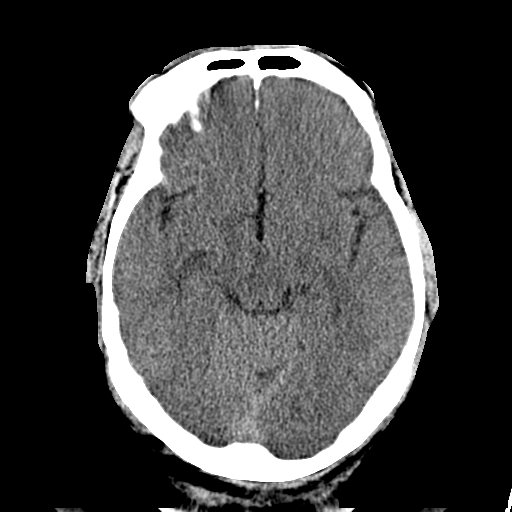
[im 13/30  brain]
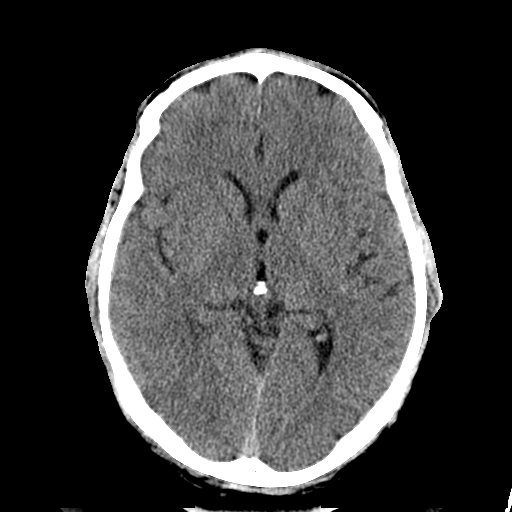
[im 15/30  brain]
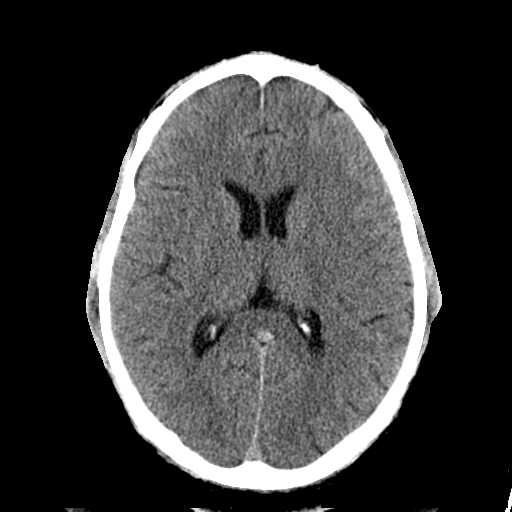
[im 16/30  brain]
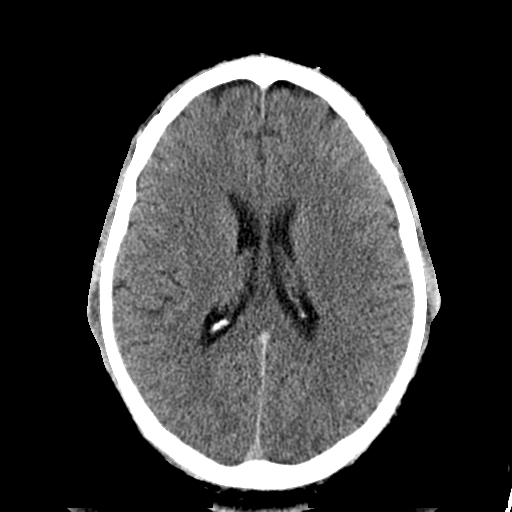
[im 16/30  bone]
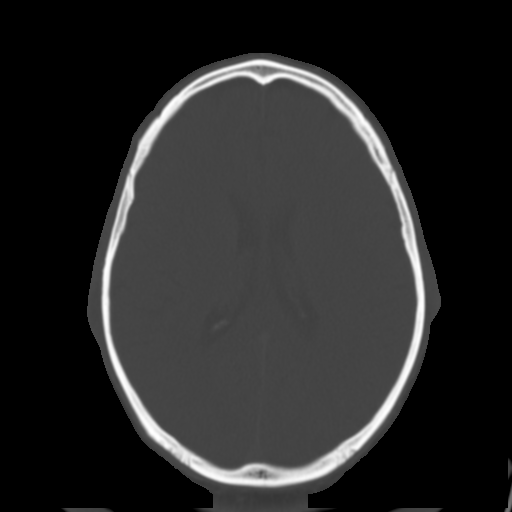
[im 18/30  brain]
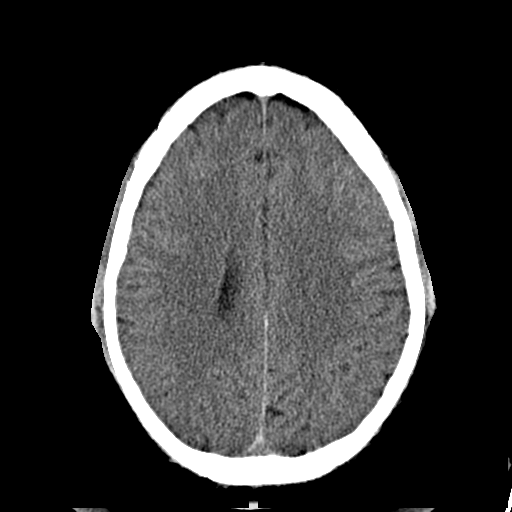
[im 20/30  brain]
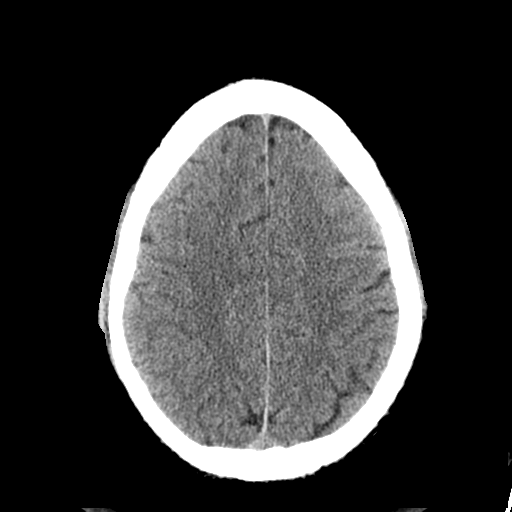
[im 22/30  brain]
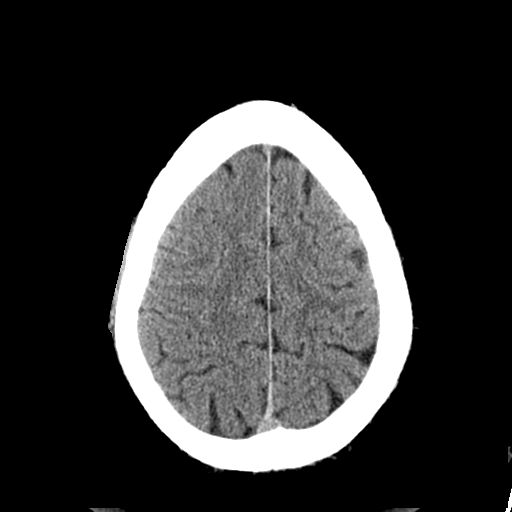
[im 23/30  brain]
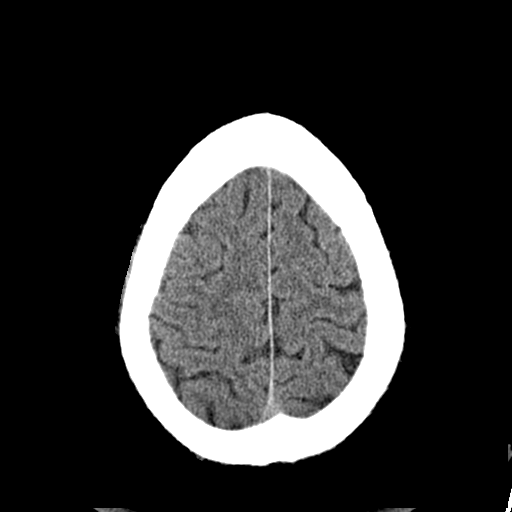
[im 23/30  bone]
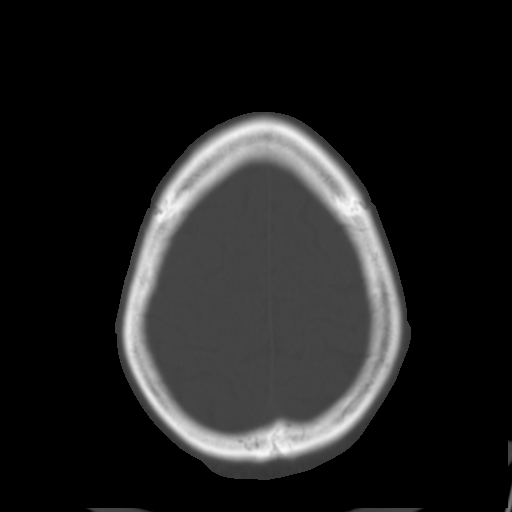
[im 25/30  brain]
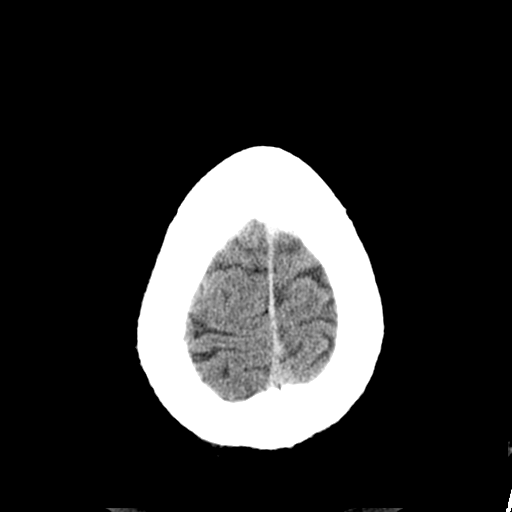
[im 27/30  brain]
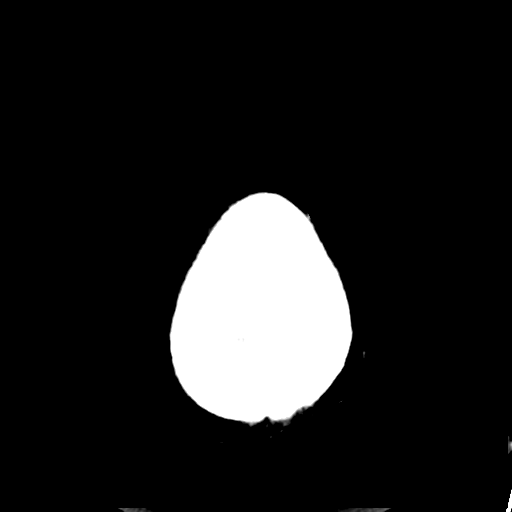
[im 29/30  brain]
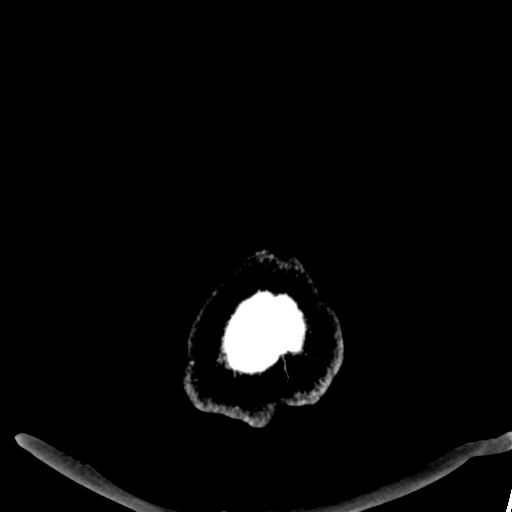

[16 of 30 positions shown; findings below may reference images not displayed]

FINDINGS: Normal ventricular morphology.
No midline shift or mass effect.
Normal appearance of brain parenchyma.
No intracranial hemorrhage, mass lesion, or acute infarct.
No extra-axial fluid collection.
Visualized paranasal sinuses and mastoid air cells clear.
Bones unremarkable.
IMPRESSION: No acute intracranial abnormalities.
No significant change since preceding study of 03/09/2008.
If symptoms persist, consider follow-up MRI brain with without
contrast to evaluate.

## 2009-10-10 ENCOUNTER — Telehealth (INDEPENDENT_AMBULATORY_CARE_PROVIDER_SITE_OTHER): Payer: Self-pay | Admitting: *Deleted

## 2009-11-10 ENCOUNTER — Telehealth (INDEPENDENT_AMBULATORY_CARE_PROVIDER_SITE_OTHER): Payer: Self-pay | Admitting: *Deleted

## 2009-11-10 ENCOUNTER — Encounter (INDEPENDENT_AMBULATORY_CARE_PROVIDER_SITE_OTHER): Payer: Self-pay | Admitting: *Deleted

## 2009-11-22 ENCOUNTER — Ambulatory Visit: Payer: Self-pay | Admitting: Internal Medicine

## 2009-11-22 DIAGNOSIS — J209 Acute bronchitis, unspecified: Secondary | ICD-10-CM | POA: Insufficient documentation

## 2009-11-23 ENCOUNTER — Telehealth (INDEPENDENT_AMBULATORY_CARE_PROVIDER_SITE_OTHER): Payer: Self-pay | Admitting: *Deleted

## 2009-11-25 ENCOUNTER — Ambulatory Visit: Payer: Self-pay | Admitting: Infectious Diseases

## 2009-11-25 LAB — CONVERTED CEMR LAB
ALT: 18 units/L (ref 0–53)
AST: 21 units/L (ref 0–37)
Albumin: 4.4 g/dL (ref 3.5–5.2)
BUN: 13 mg/dL (ref 6–23)
Bilirubin, Direct: 0.2 mg/dL (ref 0.0–0.3)
CD4 Count: 268 microliters
CO2: 31 meq/L (ref 19–32)
Chloride: 99 meq/L (ref 96–112)
Cholesterol: 172 mg/dL (ref 0–200)
Creatinine, Ser: 1.24 mg/dL (ref 0.40–1.50)
Glucose, Bld: 98 mg/dL (ref 70–99)
HIV 1 RNA Quant: 39 copies/mL
Phosphorus: 2.8 mg/dL (ref 2.3–4.6)
Potassium: 3.7 meq/L (ref 3.5–5.3)
Total Protein: 8.2 g/dL (ref 6.0–8.3)

## 2009-12-05 ENCOUNTER — Telehealth (INDEPENDENT_AMBULATORY_CARE_PROVIDER_SITE_OTHER): Payer: Self-pay | Admitting: *Deleted

## 2009-12-06 ENCOUNTER — Ambulatory Visit: Payer: Self-pay | Admitting: Infectious Diseases

## 2009-12-06 DIAGNOSIS — R109 Unspecified abdominal pain: Secondary | ICD-10-CM | POA: Insufficient documentation

## 2009-12-06 LAB — CONVERTED CEMR LAB
Leukocytes, UA: NEGATIVE
Protein, ur: NEGATIVE mg/dL
Specific Gravity, Urine: 1.025 (ref 1.005–1.0)
Urine Glucose: NEGATIVE mg/dL
pH: 6.5 (ref 5.0–8.0)

## 2010-01-16 ENCOUNTER — Telehealth: Payer: Self-pay | Admitting: Infectious Diseases

## 2010-02-08 ENCOUNTER — Telehealth (INDEPENDENT_AMBULATORY_CARE_PROVIDER_SITE_OTHER): Payer: Self-pay | Admitting: *Deleted

## 2010-03-17 ENCOUNTER — Ambulatory Visit: Payer: Self-pay | Admitting: Infectious Diseases

## 2010-03-17 ENCOUNTER — Encounter: Payer: Self-pay | Admitting: Infectious Disease

## 2010-03-17 LAB — CONVERTED CEMR LAB
AST: 12 units/L (ref 0–37)
Alkaline Phosphatase: 83 units/L (ref 39–117)
BUN: 14 mg/dL (ref 6–23)
CD4 Count: 241 microliters
CO2: 29 meq/L (ref 19–32)
Calcium: 9.9 mg/dL (ref 8.4–10.5)
Chloride: 97 meq/L (ref 96–112)
Creatinine, Ser: 1.27 mg/dL (ref 0.40–1.50)
HIV 1 RNA Quant: 39 copies/mL
Hemoglobin, Urine: NEGATIVE
Hep A Total Ab: POSITIVE — AB
Hep B Core Total Ab: POSITIVE — AB
Indirect Bilirubin: 0.4 mg/dL (ref 0.0–0.9)
Ketones, ur: NEGATIVE mg/dL
LDL Cholesterol: 127 mg/dL — ABNORMAL HIGH (ref 0–99)
Leukocytes, UA: NEGATIVE
Nitrite: NEGATIVE
Protein, ur: NEGATIVE mg/dL
Total Bilirubin: 0.6 mg/dL (ref 0.3–1.2)
Total Protein, Urine: 12
Triglycerides: 198 mg/dL — ABNORMAL HIGH (ref <150)
pH: 6 (ref 5.0–8.0)

## 2010-05-09 ENCOUNTER — Ambulatory Visit: Payer: Self-pay | Admitting: Infectious Disease

## 2010-05-09 DIAGNOSIS — R5381 Other malaise: Secondary | ICD-10-CM | POA: Insufficient documentation

## 2010-05-09 DIAGNOSIS — R5383 Other fatigue: Secondary | ICD-10-CM

## 2010-05-09 LAB — CONVERTED CEMR LAB: TSH: 0.551 microintl units/mL (ref 0.350–4.500)

## 2010-07-10 ENCOUNTER — Ambulatory Visit: Payer: Self-pay | Admitting: Infectious Disease

## 2010-07-10 ENCOUNTER — Encounter: Payer: Self-pay | Admitting: Infectious Disease

## 2010-07-10 LAB — CONVERTED CEMR LAB
AST: 16 units/L (ref 0–37)
Alkaline Phosphatase: 81 units/L (ref 39–117)
BUN: 10 mg/dL (ref 6–23)
Bilirubin, Direct: 0.1 mg/dL (ref 0.0–0.3)
CD4 Count: 241 microliters
CO2: 30 meq/L (ref 19–32)
Calcium: 9.4 mg/dL (ref 8.4–10.5)
Cholesterol: 176 mg/dL (ref 0–200)
Creatinine, Ser: 1.16 mg/dL (ref 0.40–1.50)
Glucose, Bld: 106 mg/dL — ABNORMAL HIGH (ref 70–99)
Indirect Bilirubin: 0.5 mg/dL (ref 0.0–0.9)
Phosphorus: 3.4 mg/dL (ref 2.3–4.6)
Total Bilirubin: 0.6 mg/dL (ref 0.3–1.2)
Total CHOL/HDL Ratio: 6.3

## 2010-08-23 ENCOUNTER — Encounter (INDEPENDENT_AMBULATORY_CARE_PROVIDER_SITE_OTHER): Payer: Self-pay | Admitting: *Deleted

## 2010-08-28 ENCOUNTER — Encounter (INDEPENDENT_AMBULATORY_CARE_PROVIDER_SITE_OTHER): Payer: Self-pay | Admitting: *Deleted

## 2010-10-23 ENCOUNTER — Ambulatory Visit
Admission: RE | Admit: 2010-10-23 | Discharge: 2010-10-23 | Payer: Self-pay | Source: Home / Self Care | Attending: Infectious Disease | Admitting: Infectious Disease

## 2010-10-23 ENCOUNTER — Encounter: Payer: Self-pay | Admitting: Infectious Disease

## 2010-10-23 LAB — CONVERTED CEMR LAB
AST: 13 units/L (ref 0–37)
Bilirubin, Direct: 0.1 mg/dL (ref 0.0–0.3)
CO2: 29 meq/L (ref 19–32)
Calcium: 9.4 mg/dL (ref 8.4–10.5)
Glucose, Bld: 123 mg/dL — ABNORMAL HIGH (ref 70–99)
HIV 1 RNA Quant: 39 copies/mL
Phosphorus: 3.6 mg/dL (ref 2.3–4.6)
Sodium: 141 meq/L (ref 135–145)
Total Bilirubin: 0.7 mg/dL (ref 0.3–1.2)
Total CHOL/HDL Ratio: 6.6

## 2010-10-31 NOTE — Progress Notes (Signed)
Summary: [facility]ADAP one time med arrived for Feb  Phone Note Refill Request      Prescriptions: ZITHROMAX Z-PAK 250 MG TABS (AZITHROMYCIN) take as directed  #1 pack x 0   Entered by:   Paulo Fruit  BS,CPht II,MPH   Authorized by:   Yisroel Ramming MD   Signed by:   Paulo Fruit  BS,CPht II,MPH on 11/23/2009   Method used:   Samples Given   RxID:   [id number]   Patient Assist Medication Verification: Medication: Azithromycin 250mg  Zpak JYN#[id number] Exp Date:Nov 14 Tech approval:MLD **Partient ahas already called and is on his way to pick up Paulo Fruit  BS,CPht II,MPH  November 23, 2009 2:04 PM

## 2010-10-31 NOTE — Miscellaneous (Signed)
  Clinical Lists Changes  Orders: Added new Test order of T-Urinalysis ([id number]) - Signed

## 2010-10-31 NOTE — Assessment & Plan Note (Signed)
Summary: Nurse Visit (Infectious Disease)   Prior Medications: TRUVADA 200-300 MG  TABS (EMTRICITABINE-TENOFOVIR) 1 by mouth daily PREZISTA 400 MG  TABS (DARUNAVIR ETHANOLATE) 2 by mouth daily with ritonavir and food HYDROCHLOROTHIAZIDE 25 MG TABS (HYDROCHLOROTHIAZIDE) one by mouth once daily NORVIR 100 MG TABS (RITONAVIR) one by mouth once daily Current Allergies: No known allergies  Immunizations Administered:  Influenza Vaccine # 1:    Vaccine Type: Fluvax Non-MCR    Site: right deltoid    Mfr: novartic    Dose: 0.5 ml    Route: IM    Given by: Deirdre Evener RN    Exp. Date: 01/14/2011    Lot #: [id number]    VIS given: 04/25/10 version given July 10, 2010.  Flu Vaccine Consent Questions:    Do you have a history of severe allergic reactions to this vaccine? no    Any prior history of allergic reactions to egg and/or gelatin? no    Do you have a sensitivity to the preservative Thimersol? no    Do you have a past history of Guillan-Barre Syndrome? no    Do you currently have an acute febrile illness? no    Have you ever had a severe reaction to latex? no    Vaccine information given and explained to patient? yes  Orders Added: 1)  T-RPR (Syphilis) [04540-98119] 2)  Influenza Vaccine NON MCR [00028]

## 2010-10-31 NOTE — Progress Notes (Signed)
  Phone Note Call from Patient Call back at Home Phone [phone]   Caller: Patient Reason for Call: Acute Illness Summary of Call: Patient states he has been having chest, nasal congestion, productive cough and chills past few days and wanted to know if he could take OTC med for it. I told him that was fine, but to call back in a few days if he didn't start feeling better. Initial call taken by: Deirdre Evener RN,  October 04, 2009 2:24 PM

## 2010-10-31 NOTE — Assessment & Plan Note (Signed)
Summary: Wayne Wolfe 1/19JYN   Primary Provider:  Clydie Braun MD  CC:  follow-up visit.  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 Started on treatment through study.  No intercurrent illnesses or hospitalizations.  100% compliance. Main complaint is - weight gain since being started on meds - 230-250.  HIs prior ulcer on his gum has resolved.  Doing well with his meds but every once in a while he misses a dose.  Only c/o is some occas mild R flank pain that comes and goes.  No rash over the area, no change with urination or BM.  No constipated.   saw kim 2/25 and cd4 268.  vl pending    CD4 COUNT: 211 microliters (01/26/2009 16:14)  HIV1RNA QA: 70 copies/mL (01/26/2009 16:14)   Preventive Screening-Counseling & Management  Alcohol-Tobacco     Alcohol drinks/day: 0     Smoking Status: never     Packs/Day: 1/8     Year Started: 1980     Year Quit: 2004  Caffeine-Diet-Exercise     Caffeine use/day: coffee,tea, sodas occassionally     Does Patient Exercise: no     Type of exercise: home exercise     Exercise (avg: min/session): <30     Times/week: 3     Exercise Counseling: to improve exercise regimen  Safety-Violence-Falls     Seat Belt Use: yes   Updated Prior Medication List: FLUCONAZOLE 200 MG  TABS (FLUCONAZOLE) one tablet daily TRUVADA 200-300 MG  TABS (EMTRICITABINE-TENOFOVIR) 1 by mouth daily PREZISTA 400 MG  TABS (DARUNAVIR ETHANOLATE) 2 by mouth daily with ritonavir and food DAPSONE 100 MG TABS (DAPSONE) 1 by mouth daily HYDROCHLOROTHIAZIDE 25 MG TABS (HYDROCHLOROTHIAZIDE) one by mouth once daily NORVIR 100 MG TABS (RITONAVIR) one by mouth once daily  Current Allergies (reviewed today): No known allergies  Past History:  Past Medical History: Last updated: 04/20/2008 HIV dxed June 09 - CD4 20, VL [id number].  Genotype without resistance.  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  Got approved for SSI.   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 Single Former Smoker Alcohol use-no Drug use-no  Risk Factors: Alcohol Use: 0 (12/06/2009) Caffeine Use: coffee,tea, sodas occassionally (12/06/2009) Exercise: no (12/06/2009)  Risk Factors: Smoking Status: never (12/06/2009) Packs/Day: 1/8 (12/06/2009)  Review of Systems       11 systems reviewed and negative except per HPI   Vital Signs:  Patient profile:   45 year old male Height:      67 inches (170.18 cm) Weight:      249.2 pounds (113.27 kg) BMI:     39.17 Temp:     98.0 degrees F (36.67 degrees C) oral Pulse rate:   80 / minute BP sitting:   111 / 81  (left arm)  Vitals Entered By: Baxter Hire) (December 06, 2009 9:32 AM) CC: follow-up visit Pain Assessment Patient in pain? yes     Location: right side Intensity: 0 Type: dull pain Onset of pain  Gradual Nutritional  Status BMI of > 30 = obese Nutritional Status Detail appetite is good per patient  Have you ever been in a relationship where you felt threatened, hurt or afraid?No   Does patient need assistance? Functional Status Self care Ambulation Normal   Physical Exam  General:  alert.  oveweight Head:  normocephalic and atraumatic.   Eyes:  vision grossly intact, pupils equal, and pupils round.   Mouth:  fair dentition.   Neck:  supple.   Lungs:  normal respiratory effort, no intercostal retractions, and normal breath sounds.   Heart:  normal rate and regular rhythm.     Abdomen:  soft and normal bowel sounds.   Msk:  normal ROM.   Extremities:  no cce Neurologic:  alert & oriented X3, cranial nerves II-XII intact, strength normal in all extremities, and sensation intact to pinprick.   Skin:  no rashes.   Cervical Nodes:  no anterior cervical adenopathy and no posterior cervical adenopathy.   Psych:  Oriented X3 and memory intact for recent and remote.          Medication Adherence: 12/06/2009   Adherence to medications reviewed with patient. Counseling to provide adequate adherence provided   Prevention For Positives: 12/06/2009   Safe sex practices discussed with patient. Condoms offered.                             Impression & Recommendations:  Problem # 1:  AIDS (ICD-042) Doing well with current meds.  CD4 268 in Feb 2011 Continues on study with Selena Batten Will stop Dapsone at this time. His Mother and siblings and his pastor in his church knows his diagnosis and are very supportive. Getting disability currently  Added new observation of CD4 COUNT: 223 microliters (04/20/2009 12:22) Added new observation of HIV1RNA QA: 138 copies/mL (04/20/2009 12:22) Diagnostics Reviewed:  HIV: CDC-defined AIDS (04/08/2008)   CD4: 223 (04/20/2009)   CD4 %: 12 (03/14/2008) WBC: 2.1 (10/11/2008)   Hgb: 15.3 (10/11/2008)   HCT: 46.9 (10/11/2008)   Platelets: 190 (10/11/2008) HIV-1 RNA: 138 (04/20/2009)   HBSAg: NEG (08/24/2008)  Problem # 2:  NEUTROPENIA UNSPECIFIED (ICD-288.00) resolving with treatment of HIV  Problem # 3:  ESSENTIAL HYPERTENSION, BENIGN (ICD-401.1)  BP great on HCTZ His updated medication list for this problem includes:    Hydrochlorothiazide 25 Mg Tabs (Hydrochlorothiazide) ..... 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  Reviewed preventive care protocols, scheduled due services, and updated immunizations.  Problem # 6:  FLANK PAIN, RIGHT (ICD-789.09) Will check UA as one possibility is nephrolithisas.  otherwise will monitor.  Other Orders: Est. Patient Level IV (04540)  Patient Instructions: 1)  Please schedule a follow-up appointment in 6 months with Dr Ninetta Lights of [facility]    Hepatitis A Vaccine # 2 (to be given today)  Appended Document: RESFROM 3/14KAM   Hepatitis A Vaccine # 2    Vaccine Type: HepA    Site: right deltoid    Mfr: Havrix    Dose: 1.0 ml  Route: IM    Given by: Kathi Simpers CMA(AAMA)    Exp. Date: 12/03/2011    Lot #: [id number]    VIS given: 12/19/04 version given December 06, 2009.

## 2010-10-31 NOTE — Assessment & Plan Note (Signed)
Summary: STUDY APPT/ LH    Current Allergies: No known allergies  Vital Signs:  Patient profile:   45 year old male Weight:      270.25 pounds (122.84 kg) BMI:     41.85 Temp:     97.8 degrees F (36.56 degrees C) oral Pulse rate:   60 / minute Resp:     15 per minute BP sitting:   116 / 82  (left arm) Is Patient Diabetic? No Pain Assessment Patient in pain? no      Nutritional Status BMI of > 30 = obese  Does patient need assistance? Functional Status Self care Ambulation Normal   Patient here for week 112 study visit. He denies any new problems. Still has some fatigue and pain/numbness in his feet. He recently was approved for medicare.Deirdre Evener RN  July 10, 2010 12:08 PM   Other Orders: Est. Patient Research Study [phone]) T-Basic Metabolic Panel [id number]) T-Hepatic Function [phone]) T-Lipid Profile [phone]) T-Phosphorus [phone])

## 2010-10-31 NOTE — Assessment & Plan Note (Signed)
Summary: STUDY APPT/ LH    Current Allergies: No known allergies  Vital Signs:  Patient profile:   45 year old male Weight:      245.6 pounds (111.64 kg) BMI:     38.61 Temp:     97.7 degrees F oral Pulse rate:   64 / minute Resp:     18 per minute BP sitting:   111 / 90  (left arm) Is Patient Diabetic? No Research Study Name: ARDENT/ALLRT Pain Assessment Patient in pain? no      Nutritional Status BMI of > 30 = obese  Does patient need assistance? Functional Status Self care Ambulation Normal  Patient here for week 80 study visit. He was recently seen in the clinic for bronchitis and is still on his z-pak. He says he feels better now. Still has sweats, some numbness/pain in his feet and fatigue. He is to see Dr. Sampson Goon in a few weeks.Deirdre Evener RN  November 25, 2009 11:07 AM    Other Orders: Est. Patient Research Study [phone]) T-Basic Metabolic Panel [id number]) T-Hepatic Function [phone]) T-Phosphorus [phone]) T-Lipid Profile [phone]) Process Orders Check Orders Results:     [facility]: ABN not required for this insurance Tests Sent for requisitioning (November 25, 2009 10:56 AM):     11/25/2009: [facility] -- T-Basic Metabolic Panel [phone] (signed)     11/25/2009: [facility] -- T-Hepatic Function [phone] (signed)     11/25/2009: [facility] -- T-Phosphorus [[id number]] (signed)     11/25/2009: [facility] -- T-Lipid Profile [phone] (signed)

## 2010-10-31 NOTE — Miscellaneous (Signed)
Summary: RW FINANCIAL UPDATE  Clinical Lists Changes  Observations: Added new observation of HOUSEINCOME: 09811  (08/28/2010 9:43) Added new observation of PCTFPL: 132.96  (08/28/2010 9:43) Added new observation of FINASSESSDT: 07/25/2010  (08/28/2010 9:43)

## 2010-10-31 NOTE — Progress Notes (Signed)
Summary: [place]ADAP/pt assist meds arrived for May  Phone Note Refill Request      Prescriptions: NORVIR 100 MG TABS (RITONAVIR) one by mouth once daily  #30 x 0   Entered by:   Paulo Fruit  BS,CPht II,MPH   Authorized by:   Clydie Braun MD   Signed by:   Paulo Fruit  BS,CPht II,MPH on 02/08/2010   Method used:   Samples Given   RxID:   [id number] PREZISTA 400 MG  TABS (DARUNAVIR ETHANOLATE) 2 by mouth daily with ritonavir and food  #60 x 0   Entered by:   Paulo Fruit  BS,CPht II,MPH   Authorized by:   Clydie Braun MD   Signed by:   Paulo Fruit  BS,CPht II,MPH on 02/08/2010   Method used:   Samples Given   RxID:   [id number] TRUVADA 200-300 MG  TABS (EMTRICITABINE-TENOFOVIR) 1 by mouth daily  #30 x 0   Entered by:   Paulo Fruit  BS,CPht II,MPH   Authorized by:   Clydie Braun MD   Signed by:   Paulo Fruit  BS,CPht II,MPH on 02/08/2010   Method used:   Samples Given   RxID:   [id number]   Patient Assist Medication Verification: Medication:Norvir 100mg  Lot# [id number] Exp Date:02 Jun 2011 Tech approval:MLD                Patient Assist Medication Verification: Medication:Truvada Lot# [id number] Exp Date:10 2014 Tech approval:MLD                Patient Assist Medication Verification: Medication:Prezista 400mg  Lot# [id number] Exp Date:12 2012 Tech approval:MLD Call placed to patient with message that assistance medications are ready for pick-up. patient states he will pick up tomorrow (Thursday, Feb 09, 2010) morning Paulo Fruit  BS,CPht II,MPH  Feb 08, 2010 3:19 PM

## 2010-10-31 NOTE — Miscellaneous (Signed)
Summary: HIV-1 RNA, CD4 (RESEARCH)  Clinical Lists Changes  Observations: Added new observation of CD4 COUNT: 268 microliters (11/25/2009 10:30) Added new observation of HIV1RNA QA: 39 copies/mL (11/25/2009 10:30)

## 2010-10-31 NOTE — Assessment & Plan Note (Signed)
Summary: STUDY APPT/ LH    Current Allergies: No known allergies  Vital Signs:  Patient profile:   45 year old male Height:      67.5 inches (171.45 cm) Weight:      256.25 pounds (116.48 kg) BMI:     39.68 Temp:     98.1 degrees F oral Pulse rate:   76 / minute Resp:     16 per minute BP sitting:   110 / 86  (left arm) Is Patient Diabetic? No Pain Assessment Patient in pain? no      Nutritional Status BMI of > 30 = obese  Does patient need assistance? Functional Status Self care Ambulation Normal   Patient here for week 96 study visit. He denies any new problems. Still has occassional fatigue, nightsweats and tingling burning pain in his toes. He has been very adherent. Will return in 4 months.Deirdre Evener RN  March 17, 2010 10:27 AM   Other Orders: Est. Patient Research Study [phone]) T-Basic Metabolic Panel [phone]) T-Hepatic Function [phone]) T-Phosphorus [phone]) T-Lipid Profile [phone]) T-Urine Protein [phone]) T-Urine Creatinine [id number]) T-Urinalysis ([id number]) T-Hepatitis B Core Antibody ([id number]) T-Hepatitis C Antibody ([id number]) T-Hepatitis A Antibody ([id number]) T-Hepatitis B Surface Antigen ([id number])  Process Orders Check Orders Results:     [facility]: ABN not required for this insurance Tests Sent for requisitioning (March 17, 2010 10:25 AM):     03/17/2010: [facility] -- T-Basic Metabolic Panel [phone] (signed)     03/17/2010: [facility] -- T-Hepatic Function [phone] (signed)     03/17/2010: [facility] -- T-Phosphorus [[id number]] (signed)     03/17/2010: [facility] -- T-Lipid Profile [phone] (signed)     03/17/2010: [facility] -- T-Urine Protein [phone] (signed)     03/17/2010: [facility] -- T-Urine Creatinine [[id number]] (signed)     03/17/2010: [facility] -- T-Urinalysis [[id number]] (signed)     03/17/2010: [facility] -- T-Hepatitis B Core Antibody [[id number]] (signed)     03/17/2010: [facility] -- T-Hepatitis C Antibody [[id number]] (signed)     03/17/2010: [facility] -- T-Hepatitis A Antibody [[id number]] (signed)     03/17/2010: [facility] -- T-Hepatitis B Surface Antigen [[id number]] (signed)

## 2010-10-31 NOTE — Miscellaneous (Signed)
  Clinical Lists Changes 

## 2010-10-31 NOTE — Assessment & Plan Note (Signed)
Summary: transfer from Dr. Sampson Goon /dde   Visit Type:  New Patient Primary Provider:  Paulette Blanch Dam MD  CC:  follow-up visit.  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 He was apparently anxious about his crypto relapsing off the antifungals but he appears to have received more than enough therapy. He is otherwise doing relatively well. He has noticed some problems with heat intolerance and fatigue. His mood is good. He is not sexually active. He continues to live by himself. I spent greater than 45 minutes in total working on this pt incluidng greater than 50% face to face counselling.  Problems Prior to Update: 1)  Flank Pain, Right  (ICD-789.09) 2)  Acute Bronchitis  (ICD-466.0) 3)  Oral Aphthae  (ICD-528.2) 4)  Abnormal Weight Gain  (ICD-783.1) 5)  Allergic Rhinitis Cause Unspecified  (ICD-477.9) 6)  Shoulder Pain  (ICD-719.41) 7)  Preventive Health Care  (ICD-V70.0) 8)  Neutropenia Unspecified  (ICD-288.00) 9)  Essential Hypertension, Benign  (ICD-401.1) 10)  Family History of Cad Male 1st Degree Relative <50  (ICD-V17.3) 11)  Family History of Cad Male 1st Degree Relative <60  (ICD-V16.49) 12)  Family History Diabetes 1st Degree Relative  (ICD-V18.0) 13)  Carpal Tunnel Syndrome, Left  (ICD-354.0) 14)  Pneumonia, Right Lower Lobe  (ICD-486) 15)  Fluid Overload  (ICD-276.6) 16)  Renal Failure, Acute  (ICD-584.9) 17)  Hyponatremia  (ICD-276.1) 18)  Aids  (ICD-042) 19)  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 One Tablet Daily 2)  Truvada 200-300 Mg  Tabs (Emtricitabine-Tenofovir) .Marland Kitchen.. 1 By Mouth Daily 3)  Prezista 400 Mg  Tabs (Darunavir Ethanolate) .... 2 By Mouth Daily With Ritonavir and Food 4)  Hydrochlorothiazide 25 Mg Tabs (Hydrochlorothiazide) ....  One By Mouth Once Daily 5)  Norvir 100 Mg Tabs (Ritonavir) .... One By Mouth Once Daily  Current Medications (verified): 1)  Truvada 200-300 Mg  Tabs (Emtricitabine-Tenofovir) .Marland Kitchen.. 1 By Mouth Daily 2)  Prezista 400 Mg  Tabs (Darunavir Ethanolate) .... 2 By Mouth Daily With Ritonavir and Food 3)  Hydrochlorothiazide 25 Mg Tabs (Hydrochlorothiazide) .... One By Mouth Once Daily 4)  Norvir 100 Mg Tabs (Ritonavir) .... One By Mouth Once Daily  Allergies (verified): No Known Drug Allergies   Preventive Screening-Counseling & Management  Alcohol-Tobacco     Alcohol drinks/day: 0     Smoking Status: never     Packs/Day: 1/8     Year Started: 1980     Year Quit: 2004  Caffeine-Diet-Exercise     Caffeine use/day: coffee,tea, sodas occassionally     Does Patient Exercise: no     Type of exercise: home exercise     Exercise (avg: min/session): <30     Times/week: 3     Exercise Counseling: to improve exercise regimen  Safety-Violence-Falls     Seat Belt Use: yes   Current Allergies (reviewed today): No known allergies  Family History: Reviewed history from 04/20/2008 and no changes required. Family History Diabetes 1st degree relative  Family History of CAD Male 1st degree relative  mother age 65s Family History of CAD Male 1st degree relative but unclear when dxed  Social History: Reviewed history from 11/30/2008 and no changes required. Occupation: Unemployed since just before the hospitalization.  Got approved for SSI.   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 Single Former Smoker Alcohol use-no Drug use-no  Review of Systems  The patient denies anorexia, fever, weight loss, weight gain, vision loss, decreased hearing, hoarseness, chest pain, syncope, dyspnea on exertion, peripheral edema, prolonged cough, headaches, hemoptysis, abdominal pain, melena, hematochezia, severe indigestion/heartburn, hematuria, incontinence,  genital sores, muscle weakness, suspicious skin lesions, transient blindness, difficulty walking, depression, unusual weight change, abnormal bleeding, and enlarged lymph nodes.    Vital Signs:  Patient profile:   45 year old male Height:      67.5 inches (171.45 cm) Weight:      269.0 pounds (122.27 kg) BMI:     41.66 Temp:     98.2 degrees F (36.78 degrees C) oral Pulse rate:   87 / minute BP sitting:   119 / 81  (left arm)  Vitals Entered By: Baxter Hire) (May 09, 2010 10:41 AM) CC: follow-up visit Pain Assessment Patient in pain? no      Nutritional Status BMI of > 30 = obese Nutritional Status Detail appetite is good per patient  Have you ever been in a relationship where you felt threatened, hurt or afraid?No   Does patient need assistance? Functional Status Self care Ambulation Normal   Physical Exam  General:  alert.  oveweight Head:  normocephalic and atraumatic.   Eyes:  vision grossly intact, pupils equal, and pupils round.   Ears:  no external deformities.   Nose:  no external deformity and no external erythema.   Mouth:  fair dentition.  pharynx pink and moist, no erythema, and no exudates.   Neck:  supple and full ROM.   Lungs:  normal respiratory effort, no intercostal retractions, and normal breath sounds.   Heart:  normal rate and regular rhythm.   Abdomen:  soft and normal bowel sounds.   Msk:  normal ROM and no joint deformities.   Extremities:  no cce Neurologic:  alert & oriented X3, strength normal in all extremities and gait normal.   Skin:  no rashes.   Psych:  Oriented X3 and memory intact for recent and remote.      Impression & Recommendations:  Problem # 1:  AIDS (ICD-042)  Excellent control. COntinue prz, rtn and truvad via studya The following medications were removed from the medication list:    Fluconazole 200 Mg Tabs (Fluconazole) ..... One tablet daily  Diagnostics Reviewed:  HIV: CDC-defined AIDS (04/08/2008)    CD4: 241 (03/17/2010)   CD4 %: 12 (03/14/2008) WBC: 2.1 (10/11/2008)   Hgb: 15.3 (10/11/2008)   HCT: 46.9 (10/11/2008)   Platelets: 190 (10/11/2008) HIV-1 RNA: 39 (03/17/2010)   HBSAg: NEG (03/17/2010)  Orders: Est. 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 Patient Level V (96295)  Problem # 3:  ESSENTIAL HYPERTENSION, BENIGN (ICD-401.1) Assessment: Improved  Excelent contorl His updated medication list for this problem includes:    Hydrochlorothiazide 25 Mg Tabs (Hydrochlorothiazide) ..... One by mouth once daily  BP today: 119/81 Prior BP: 110/86 (03/17/2010)  Labs Reviewed: K+: 4.2 (03/17/2010) Creat: : 1.27 (03/17/2010)   Chol: 193 (03/17/2010)   HDL: 26 (03/17/2010)   LDL: 127 (03/17/2010)   TG: 198 (03/17/2010)  Orders: Est. Patient Level V (28413)  Problem # 4:  FATIGUE (ICD-780.79) Assessment: New Check tfts Orders: T-TSH ([id number]) Est. Patient Level V (24401)  Other Orders: T-GC Probe, urine (02725-36644) T-Chlamydia  Probe,  urine [id number])  Patient Instructions: 1)  Stop the fluconazole 2)  rtc in 6 months    Prevention & Chronic Care Immunizations   Influenza vaccine: Fluvax Non-MCR  (08/02/2009)    Tetanus booster: Not documented    Pneumococcal vaccine: Pneumovax  (04/08/2008)  Other Screening   PSA: Not documented   Smoking status: never  (05/09/2010)  Lipids   Total Cholesterol: 193  (03/17/2010)   LDL: 127  (03/17/2010)   LDL Direct: Not documented   HDL: 26  (03/17/2010)   Triglycerides: 198  (03/17/2010)  Hypertension   Last Blood Pressure: 119 / 81  (05/09/2010)   Serum creatinine: 1.27  (03/17/2010)   Serum potassium 4.2  (03/17/2010)  Self-Management Support :    Hypertension self-management support: Not documented

## 2010-10-31 NOTE — Miscellaneous (Signed)
Summary: HIV-1 RNA, CD4 (RESEARCH)  Clinical Lists Changes  Observations: Added new observation of CD4 COUNT: 241 microliters (07/10/2010 8:31) Added new observation of HIV1RNA QA: 39 copies/mL (07/10/2010 8:31)

## 2010-10-31 NOTE — Miscellaneous (Signed)
Summary: HIV-1 RNA, CD4 (RESEARCH)  Clinical Lists Changes  Observations: Added new observation of CD4 COUNT: 241 microliters (03/17/2010 9:41) Added new observation of HIV1RNA QA: 39 copies/mL (03/17/2010 9:41)

## 2010-10-31 NOTE — Progress Notes (Signed)
Summary: [employer]/pt assist meds arrived for Jan  Phone Note Refill Request      Prescriptions: NORVIR 100 MG TABS (RITONAVIR) one by mouth once daily  #30 x 0   Entered by:   Paulo Fruit  BS,CPht II,MPH   Authorized by:   Clydie Braun MD   Signed by:   Paulo Fruit  BS,CPht II,MPH on 10/10/2009   Method used:   Samples Given   RxID:   [id number] DAPSONE 100 MG TABS (DAPSONE) 1 by mouth daily  #30 x 0   Entered by:   Paulo Fruit  BS,CPht II,MPH   Authorized by:   Clydie Braun MD   Signed by:   Paulo Fruit  BS,CPht II,MPH on 10/10/2009   Method used:   Samples Given   RxID:   [id number] PREZISTA 400 MG  TABS (DARUNAVIR ETHANOLATE) 2 by mouth daily with ritonavir and food  #60 x 0   Entered by:   Paulo Fruit  BS,CPht II,MPH   Authorized by:   Clydie Braun MD   Signed by:   Paulo Fruit  BS,CPht II,MPH on 10/10/2009   Method used:   Samples Given   RxID:   [id number] FLUCONAZOLE 200 MG  TABS (FLUCONAZOLE) one tablet daily  #30 x 0   Entered by:   Paulo Fruit  BS,CPht II,MPH   Authorized by:   Clydie Braun MD   Signed by:   Paulo Fruit  BS,CPht II,MPH on 10/10/2009   Method used:   Samples Given   RxID:   [id number] TRUVADA 200-300 MG  TABS (EMTRICITABINE-TENOFOVIR) 1 by mouth daily  #30 x 0   Entered by:   Paulo Fruit  BS,CPht II,MPH   Authorized by:   Clydie Braun MD   Signed by:   Paulo Fruit  BS,CPht II,MPH on 10/10/2009   Method used:   Samples Given   RxID:   [id number]  Patient Assist Medication Verification: Medication name: Fluconazole 200mg  RX # [id number] Tech approval:MLD   Patient Assist Medication Verification: Medication: Truvada Lot# [id number] Exp Date:02 2014 Tech approval:MLD                Patient Assist Medication Verification: Medication:Dapsone 100mg  Lot#[id number] Exp Date:10 2015 Tech approval:MLD                Patient Assist Medication Verification: Medication:Prezista 400mg  Lot#[id number] Exp  Date:10 2012 Tech approval:MLD                Patient Assist Medication Verification: Medication:Norvir 100mg  [id number]#[id number] Exp Date:01 Dec 2010 Tech approval:MLD Call placed to patient with message that assistance medications are ready for pick-up. Paulo Fruit  BS,CPht II,MPH  October 10, 2009 12:13 PM

## 2010-10-31 NOTE — Progress Notes (Signed)
Summary: [employer]/pt assist meds arrived for Feb  Phone Note Refill Request      Prescriptions: NORVIR 100 MG TABS (RITONAVIR) one by mouth once daily  #30 x 0   Entered by:   Paulo Fruit  BS,CPht II,MPH   Authorized by:   Clydie Braun MD   Signed by:   Paulo Fruit  BS,CPht II,MPH on 11/10/2009   Method used:   Samples Given   RxID:   [id number] DAPSONE 100 MG TABS (DAPSONE) 1 by mouth daily  #30 x 0   Entered by:   Paulo Fruit  BS,CPht II,MPH   Authorized by:   Clydie Braun MD   Signed by:   Paulo Fruit  BS,CPht II,MPH on 11/10/2009   Method used:   Samples Given   RxID:   [id number] PREZISTA 400 MG  TABS (DARUNAVIR ETHANOLATE) 2 by mouth daily with ritonavir and food  #60 x 0   Entered by:   Paulo Fruit  BS,CPht II,MPH   Authorized by:   Clydie Braun MD   Signed by:   Paulo Fruit  BS,CPht II,MPH on 11/10/2009   Method used:   Samples Given   RxID:   [id number] TRUVADA 200-300 MG  TABS (EMTRICITABINE-TENOFOVIR) 1 by mouth daily  #30 x 0   Entered by:   Paulo Fruit  BS,CPht II,MPH   Authorized by:   Clydie Braun MD   Signed by:   Paulo Fruit  BS,CPht II,MPH on 11/10/2009   Method used:   Samples Given   RxID:   [id number] FLUCONAZOLE 200 MG  TABS (FLUCONAZOLE) one tablet daily  #30 x 0   Entered by:   Paulo Fruit  BS,CPht II,MPH   Authorized by:   Clydie Braun MD   Signed by:   Paulo Fruit  BS,CPht II,MPH on 11/10/2009   Method used:   Samples Given   RxID:   [id number]  Patient Assist Medication Verification: Medication name: Fluconazole 200mg  RX # [id number] Tech approval:MLD   Patient Assist Medication Verification: Medication: Truvada Lot# [id number] Exp Date:07 2014 Tech approval:MLD                Patient Assist Medication Verification: Medication:Prezista 400mg  Lot#[id number] Exp Date:12 2012 Tech approval:MLD                Patient Assist Medication Verification: Medication: Norvir  100mg  JKK#[id number] E21 Exp Date:01 Dec 2010 Tech approval:MLD                Patient Assist Medication Verification: Medication:Dapsone 100mg  Lot#[id number] Exp Date:10 2015 Tech approval:MLD Call placed to patient with message that assistance medications are ready for pick-up. Left message Paulo Fruit  BS,CPht II,MPH  November 10, 2009 11:33 AM

## 2010-10-31 NOTE — Assessment & Plan Note (Signed)
Summary: sinus cong/chest cong/tkk   Primary Provider:  Clydie Braun MD  CC:  cold sxs, chills, sweats, and fever .  History of Present Illness: Pt c/o chest congestion and cough for 5 days.  He has felt chilled but did not take his temperature.  He thought he was getting better but symptoms worsened over the past 2 days. No SOB.  Preventive Screening-Counseling & Management  Alcohol-Tobacco     Alcohol drinks/day: 0     Smoking Status: never     Packs/Day: 1/8     Year Started: 1980     Year Quit: 2004  Caffeine-Diet-Exercise     Caffeine use/day: yes     Does Patient Exercise: no     Exercise Counseling: to improve exercise regimen  Hep-HIV-STD-Contraception     HIV Risk: no risk noted  Safety-Violence-Falls     Seat Belt Use: yes      Sexual History:  not in a relationship now.        Drug Use:  never.     Updated Prior Medication List: FLUCONAZOLE 200 MG  TABS (FLUCONAZOLE) one tablet daily TRUVADA 200-300 MG  TABS (EMTRICITABINE-TENOFOVIR) 1 by mouth daily PREZISTA 400 MG  TABS (DARUNAVIR ETHANOLATE) 2 by mouth daily with ritonavir and food DAPSONE 100 MG TABS (DAPSONE) 1 by mouth daily HYDROCHLOROTHIAZIDE 25 MG TABS (HYDROCHLOROTHIAZIDE) one by mouth once daily NORVIR 100 MG TABS (RITONAVIR) one by mouth once daily ZITHROMAX Z-PAK 250 MG TABS (AZITHROMYCIN) take as directed  Current Allergies (reviewed today): No known allergies  Past History:  Past Medical History: Last updated: 04/20/2008 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    Vital Signs:  Patient profile:   46 year old male Height:      67 inches (170.18 cm) Weight:      248 pounds (112.73 kg) BMI:     38.98 Temp:     98.1 degrees F (36.72 degrees C) oral Pulse rate:    103 / minute BP sitting:   139 / 101  (left arm)  Vitals Entered By: Starleen Arms CMA (November 22, 2009 2:00 PM) CC: cold sxs, chills, sweats, fever  Is Patient Diabetic? No Pain Assessment Patient in pain? no      Nutritional Status BMI of > 30 = obese Nutritional Status Detail nl  Does patient need assistance? Functional Status Self care Ambulation Normal   Physical Exam  General:  alert, well-developed, well-nourished, and well-hydrated.   Head:  normocephalic and atraumatic.   Mouth:  pharynx pink and moist.   Lungs:  normal breath sounds.     Impression & Recommendations:  Problem # 1:  ACUTE BRONCHITIS (ICD-466.0) will treat with a Z-pack His updated medication list for this problem includes:    Zithromax Z-pak 250 Mg Tabs (Azithromycin) .Marland Kitchen... Take as directed  Orders: Est. Patient Level III (04540)  Medications Added to Medication List This Visit: 1)  Zithromax Z-pak 250 Mg Tabs (Azithromycin) .... Take as directed Prescriptions: ZITHROMAX Z-PAK 250 MG TABS (AZITHROMYCIN) take as directed  #1 pack x 0   Entered and Authorized by:   Yisroel Ramming MD   Signed by:   Yisroel Ramming MD on 11/22/2009   Method used:   Print then Give to Patient  RxID:   [id number]

## 2010-10-31 NOTE — Miscellaneous (Signed)
Summary: clinical update/ryan white NCADAP approved til 12/30/10  Clinical Lists Changes  Observations: Added new observation of AIDSDAP: Yes 2011 (11/10/2009 16:31)

## 2010-10-31 NOTE — Progress Notes (Signed)
  Phone Note Call from Patient Call back at [facility] Phone [phone]   Caller: Patient Reason for Call: Refill Medication Summary of Call: Durwin is asking for a refill on his BP meds.Deirdre Evener RN  January 16, 2010 12:01 PM Initial call taken by: Deirdre Evener RN,  January 16, 2010 12:01 PM

## 2010-10-31 NOTE — Progress Notes (Signed)
Summary: NCADAP/pt assist meds arrived for Mar  Phone Note Refill Request      Prescriptions: NORVIR 100 MG TABS (RITONAVIR) one by mouth once daily  #30 x 0   Entered by:   Paulo Fruit  BS,CPht II,MPH   Authorized by:   Clydie Braun MD   Signed by:   Paulo Fruit  BS,CPht II,MPH on 12/05/2009   Method used:   Samples Given   RxID:   [id number] DAPSONE 100 MG TABS (DAPSONE) 1 by mouth daily  #30 x 0   Entered by:   Paulo Fruit  BS,CPht II,MPH   Authorized by:   Clydie Braun MD   Signed by:   Paulo Fruit  BS,CPht II,MPH on 12/05/2009   Method used:   Samples Given   RxID:   [id number] PREZISTA 400 MG  TABS (DARUNAVIR ETHANOLATE) 2 by mouth daily with ritonavir and food  #60 x 0   Entered by:   Paulo Fruit  BS,CPht II,MPH   Authorized by:   Clydie Braun MD   Signed by:   Paulo Fruit  BS,CPht II,MPH on 12/05/2009   Method used:   Samples Given   RxID:   [id number] TRUVADA 200-300 MG  TABS (EMTRICITABINE-TENOFOVIR) 1 by mouth daily  #30 x 0   Entered by:   Paulo Fruit  BS,CPht II,MPH   Authorized by:   Clydie Braun MD   Signed by:   Paulo Fruit  BS,CPht II,MPH on 12/05/2009   Method used:   Samples Given   RxID:   [id number] FLUCONAZOLE 200 MG  TABS (FLUCONAZOLE) one tablet daily  #30 x 0   Entered by:   Paulo Fruit  BS,CPht II,MPH   Authorized by:   Clydie Braun MD   Signed by:   Paulo Fruit  BS,CPht II,MPH on 12/05/2009   Method used:   Samples Given   RxID:   [id number]  Patient Assist Medication Verification: Medication name: Fluconazole 200mg  RX # [id number] Tech approval:MLD   Patient Assist Medication Verification: Medication: Prezista 400mg  Lot#[id number] Exp Date:12 2012 Tech approval:MLD                Patient Assist Medication Verification: Medication:Truvada Lot# [id number] Exp Date:08 2014 Tech approval:MLD                Patient Assist Medication Verification: Medication:Dapsone 100mg  Lot#  [id number] Exp Date:10 2015 Tech approval:MLD                Patient Assist Medication Verification: Medication:Norvir 100mg  FUX#[id number] E21 Exp Date:01 Feb 2011 Tech approval:MLD Call placed to patient with message that assistance medications are ready for pick-up. Paulo Fruit  BS,CPht II,MPH  December 05, 2009 12:52 PM

## 2010-11-02 NOTE — Assessment & Plan Note (Signed)
Summary: STUDY APPT/ [facility]    Current Allergies: No known allergies  Vital Signs:  Patient profile:   45 year old male Weight:      273.25 pounds (124.20 kg) Temp:     98.1 degrees F (36.72 degrees C) oral Pulse rate:   76 / minute Resp:     14 per minute BP sitting:   118 / 90 Cuff size:   regular Is Patient Diabetic? No Pain Assessment Patient in pain? no      Nutritional Status BMI of > 30 = obese  Does patient need assistance? Functional Status Self care Ambulation Normal   Pt seen for week 128 study visit with ACTG A5001 and A5257. Pt denies any pain or new findings. Vital signs stable. Drew labs, measured waist circumference, and self [employer]. Pt to make routine appointment with Dr. Daiva Eves at front desk. Next anticipated Research visit for 02/20/2011 at 9am. Deirdre Evener RN  October 23, 2010 12:03 PM   Other Orders: Est. Patient Research Study [phone]) T-Basic Metabolic Panel [phone]) T-Lipid Profile [phone]) T-Phosphorus [phone]) T-Hepatic Function [id number])

## 2010-11-08 ENCOUNTER — Encounter (INDEPENDENT_AMBULATORY_CARE_PROVIDER_SITE_OTHER): Payer: Self-pay | Admitting: *Deleted

## 2010-11-08 NOTE — Miscellaneous (Signed)
Summary: HIV-1 RNA, CD4 (RESEARCH)  Clinical Lists Changes  Observations: Added new observation of CD4 COUNT: 247 microliters (10/23/2010 14:14) Added new observation of HIV1RNA QA: 39 copies/mL (10/23/2010 14:14)

## 2010-11-15 ENCOUNTER — Encounter (INDEPENDENT_AMBULATORY_CARE_PROVIDER_SITE_OTHER): Payer: Self-pay | Admitting: *Deleted

## 2010-11-16 NOTE — Miscellaneous (Signed)
Summary: ADAP Update   Clinical Lists Changes  Observations: Added new observation of AIDSDAP: Pending-approval for 2012  (11/08/2010 16:41)

## 2010-11-21 ENCOUNTER — Ambulatory Visit (INDEPENDENT_AMBULATORY_CARE_PROVIDER_SITE_OTHER): Payer: Self-pay | Admitting: Infectious Disease

## 2010-11-21 ENCOUNTER — Encounter: Payer: Self-pay | Admitting: Infectious Disease

## 2010-11-21 DIAGNOSIS — R7309 Other abnormal glucose: Secondary | ICD-10-CM

## 2010-11-21 DIAGNOSIS — B20 Human immunodeficiency virus [HIV] disease: Secondary | ICD-10-CM

## 2010-11-21 DIAGNOSIS — E162 Hypoglycemia, unspecified: Secondary | ICD-10-CM | POA: Insufficient documentation

## 2010-11-21 DIAGNOSIS — H5310 Unspecified subjective visual disturbances: Secondary | ICD-10-CM | POA: Insufficient documentation

## 2010-11-21 DIAGNOSIS — J209 Acute bronchitis, unspecified: Secondary | ICD-10-CM

## 2010-11-21 LAB — CONVERTED CEMR LAB
ALT: 13 units/L (ref 0–53)
AST: 19 units/L (ref 0–37)
Albumin: 4.4 g/dL (ref 3.5–5.2)
Alkaline Phosphatase: 85 units/L (ref 39–117)
Basophils Absolute: 0.1 10*3/uL (ref 0.0–0.1)
Basophils Relative: 2 % — ABNORMAL HIGH (ref 0–1)
Glucose, Bld: 178 mg/dL — ABNORMAL HIGH (ref 70–99)
MCHC: 33.5 g/dL (ref 30.0–36.0)
Neutro Abs: 1.8 10*3/uL (ref 1.7–7.7)
Neutrophils Relative %: 42 % — ABNORMAL LOW (ref 43–77)
Platelets: 294 10*3/uL (ref 150–400)
Potassium: 3.7 meq/L (ref 3.5–5.3)
RDW: 14.3 % (ref 11.5–15.5)
Sodium: 139 meq/L (ref 135–145)
Total Protein: 7.4 g/dL (ref 6.0–8.3)

## 2010-11-22 NOTE — Miscellaneous (Signed)
  Clinical Lists Changes  Observations: Added new observation of PCTFPL: 142.12  (11/15/2010 16:16) Added new observation of HOUSEINCOME: 11914  (11/15/2010 16:16)

## 2010-11-24 ENCOUNTER — Encounter (INDEPENDENT_AMBULATORY_CARE_PROVIDER_SITE_OTHER): Payer: Self-pay | Admitting: *Deleted

## 2010-11-28 ENCOUNTER — Telehealth (INDEPENDENT_AMBULATORY_CARE_PROVIDER_SITE_OTHER): Payer: Self-pay | Admitting: Licensed Clinical Social Worker

## 2010-11-28 NOTE — Miscellaneous (Signed)
Summary: NCAPAp approved til 07/01/11  Clinical Lists Changes  Observations: Added new observation of PCTFPL: 142.12  (11/24/2010 17:26) Added new observation of FINASSESSDT: 11/08/2010  (11/24/2010 17:26) Added new observation of AIDSDAP: Yes 2012  (11/24/2010 17:26)

## 2010-11-28 NOTE — Assessment & Plan Note (Signed)
Summary: f/u ov (labs with Selena Batten)   Vital Signs:  Patient profile:   45 year old male Height:      67.5 inches Weight:      269.75 pounds BMI:     41.78 Temp:     98.2 degrees F oral Pulse rate:   83 / minute BP sitting:   137 / 91  Vitals Entered By: Starleen Arms CMA (November 21, 2010 4:07 PM) CC: follow-up visit & SOB Is Patient Diabetic? No Pain Assessment Patient in pain? no     Location: chest Nutritional Status BMI of > 30 = obese Nutritional Status Detail appetite "too good"  Have you ever been in a relationship where you felt threatened, hurt or afraid?No   Does patient need assistance? Functional Status Self care Ambulation Normal   Visit Type:  Follow-up Primary Provider:  Paulette Blanch Dam MD  CC:  follow-up visit & SOB.  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 He continues to live by himself. Pt said 2-3 weeks ago had sinus congestion and was coughing up phlegm. That got better. Now for 3-4 days feeling out of breath with walking or even while simply sitting at rest. He is without a fever. He is coughing up phlegm green material. He has noticed his vision at night being less sharp over the past several months. His BG was above 120 but he was not purely fasting. he denies polyphagia or polydipsia or polyuria (unless he is drinking coffee).  Preventive Screening-Counseling & Management  Alcohol-Tobacco     Alcohol drinks/day: 0     Smoking Status: never     Packs/Day: 1/8     Year Started: 1980     Year Quit: 2004  Caffeine-Diet-Exercise     Caffeine use/day: coffee,tea, sodas occassionally     Does Patient Exercise: no     Type of exercise: home exercise     Exercise (avg: min/session): <30     Times/week: 3     Exercise Counseling: to improve exercise regimen  Hep-HIV-STD-Contraception     HIV Risk: no  risk noted  Safety-Violence-Falls     Seat Belt Use: yes      Sexual History:  not in a relationship now.        Drug Use:  former and mj many years ago.    Comments: declined condoms.  has not missed any doses of hiv meds  Problems Prior to Update: 1)  Fatigue  (ICD-780.79) 2)  Flank Pain, Right  (ICD-789.09) 3)  Acute Bronchitis  (ICD-466.0) 4)  Oral Aphthae  (ICD-528.2) 5)  Abnormal Weight Gain  (ICD-783.1) 6)  Allergic Rhinitis Cause Unspecified  (ICD-477.9) 7)  Shoulder Pain  (ICD-719.41) 8)  Preventive Health Care  (ICD-V70.0) 9)  Neutropenia Unspecified  (ICD-288.00) 10)  Essential Hypertension, Benign  (ICD-401.1) 11)  Family History of Cad Male 1st Degree Relative <50  (ICD-V17.3) 12)  Family History of Cad Male 1st Degree Relative <60  (ICD-V16.49) 13)  Family History Diabetes 1st Degree Relative  (ICD-V18.0) 14)  Carpal Tunnel Syndrome, Left  (ICD-354.0) 15)  Pneumonia, Right Lower Lobe  (ICD-486) 16)  Fluid Overload  (ICD-276.6) 17)  Renal Failure, Acute  (ICD-584.9) 18)  Hyponatremia  (ICD-276.1) 19)  Aids  (ICD-042) 20)  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 1 By Mouth Daily 2)  Prezista 400 Mg  Tabs (Darunavir Ethanolate) .... 2 By Mouth Daily With Ritonavir and Food 3)  Hydrochlorothiazide 25 Mg Tabs (Hydrochlorothiazide) .... One By Mouth Once Daily 4)  Norvir 100 Mg Tabs (Ritonavir) .... One By Mouth Once Daily  Current Medications (verified): 1)  Truvada 200-300 Mg  Tabs (Emtricitabine-Tenofovir) .Marland Kitchen.. 1 By Mouth Daily 2)  Prezista 400 Mg  Tabs (Darunavir Ethanolate) .... 2 By Mouth Daily With Ritonavir and Food 3)  Hydrochlorothiazide 25 Mg Tabs (Hydrochlorothiazide) .... One By Mouth Once Daily 4)  Norvir 100 Mg Tabs (Ritonavir) .... One By Mouth Once Daily 5)  Doxycycline Hyclate 100 Mg Tabs (Doxycycline Hyclate) .... Take 1 Tablet By Mouth Two Times A Day For  10 Days  Allergies: No Known Drug Allergies  Past History:  Past Medical History: Last updated: 04/20/2008 HIV dxed June 09 - CD4 20, VL [id number].  Genotype without resistance.  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  Got approved for SSI.   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 Single Former Smoker Alcohol use-no Drug use-no  Risk Factors: Alcohol Use: 0 (11/21/2010) Caffeine Use: coffee,tea, sodas occassionally (11/21/2010) Exercise: no (11/21/2010)  Risk Factors: Smoking Status: never (11/21/2010) Packs/Day: 1/8 (11/21/2010)  Social History: Reviewed history from 11/30/2008 and no changes required. Occupation: Unemployed since just before the hospitalization.  Got approved for SSI.   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 Single Former Smoker Alcohol use-no Drug use-no Drug Use:  former, mj many years ago  Review of Systems       see HPI otherwise negative on 12 pt  Physical Exam  General:  alert.  oveweight Head:  normocephalic and atraumatic.   Eyes:  vision grossly intact, pupils equal, and pupils round.   Ears:  no external deformities.   Nose:  no external deformity and no external erythema.   Mouth:  fair dentition.  pharynx pink and moist, no erythema, and no exudates.   Neck:  No deformities, masses, or tenderness noted.        Medication Adherence: 11/21/2010   Adherence to medications reviewed with patient.  Counseling to provide adequate adherence provided   Prevention For Positives: 11/21/2010   Safe sex practices discussed with patient. Condoms offered.   Education Materials Provided: 11/21/2010 Safe sex practices discussed with patient. Condoms offered.                          Impression & Recommendations:  Problem # 1:  AIDS (ICD-042)  doing superbly well His updated medication list for this problem includes:    Doxycycline Hyclate 100 Mg Tabs (Doxycycline hyclate) .Marland Kitchen... Take 1 tablet by mouth two times a day for 10 days  Diagnostics Reviewed:  HIV: CDC-defined AIDS (04/08/2008)   CD4: 247 (10/23/2010)   CD4 %: 12 (03/14/2008) WBC: 2.1 (10/11/2008)   Hgb: 15.3 (10/11/2008)   HCT: 46.9 (10/11/2008)   Platelets: 190 (10/11/2008) HIV-1 RNA:  39 (10/23/2010)   HBSAg: NEG (03/17/2010)  Orders: Est. Patient Level IV (16109)  Problem # 2:  VISUAL IMPAIRMENT (ICD-368.10) Has noticed this at nightime. Will refer to opthomology Orders: Ophthalmology Referral (Ophthalmology)  Problem # 3:  HYPERGLYCEMIA, FASTING (ICD-790.29) check A1c Orders: T-Hgb A1C (in-house) (60454UJ) Ophthalmology Referral (Ophthalmology) Est. Patient Level IV (81191)  Problem # 4:  ACUTE BRONCHITIS (ICD-466.0) will give him doxcycylien for 10 days His updated medication list for this problem includes:    Doxycycline Hyclate 100 Mg Tabs (Doxycycline hyclate) .Marland Kitchen... Take 1 tablet by mouth two times a day for 10 days  Orders: T-Comprehensive Metabolic Panel ([id number]) T-CBC w/Diff ([id number]) Est. Patient Level IV (29528)  Medications Added to Medication List This Visit: 1)  Doxycycline Hyclate 100 Mg Tabs (Doxycycline hyclate) .... Take 1 tablet by mouth two times a day for 10 days  Patient Instructions: 1)  rtc to see Dr Daiva Eves in 3 months  Prescriptions: DOXYCYCLINE HYCLATE 100 MG TABS (DOXYCYCLINE HYCLATE) Take 1 tablet by mouth two times a day for 10 days  #20 x 1   Entered and  Authorized by:   Acey Lav MD   Signed by:   Paulette Blanch Dam MD on 11/21/2010   Method used:   Electronically to        [facility] [address] [phone]* (retail)       [address]       [place], [place]  [address]       Ph: [phone] or [phone]       Fax: [phone]   RxID:   [id number]    Orders Added: 1)  T-Comprehensive Metabolic Panel [[id number]] 2)  T-CBC w/Diff [[id number]] 3)  T-Hgb A1C (in-house) [02542HC] 4)  Ophthalmology Referral [Ophthalmology] 5)  Est. Patient Level IV [62376]

## 2010-12-12 NOTE — Progress Notes (Signed)
Summary: pt. wanting call back from CMA re:  Eye MD referral  Phone Note Call from Patient Call back at Home Phone [phone]   Caller: Patient Call For: Starleen Arms, CMA Reason for Call: Talk to Nurse, Referral Summary of Call: Pt. calling to find out about his referral to an eye MD.  Pt. stated that he did have an "orange" card.  Wanting Starleen Arms to return his call. Jennet Maduro, RN, 11/28/10, 1102   Follow-up for Phone Call        I called the patient to let him know we were still waiting for a response from [facility]. I will f/u on this and give him a call Follow-up by: Starleen Arms CMA,  December 04, 2010 11:56 AM

## 2011-02-13 NOTE — Consult Note (Signed)
NAME:  Wayne Wolfe, Wayne Wolfe                ACCOUNT NO.:  [id number]   MEDICAL RECORD NO.:  [id number]          PATIENT TYPE:  INP   LOCATION:  1504                         FACILITY:  [facility]   PHYSICIAN:  Lennis P. 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  Infectious  disease is also involved.   The patient had no significant known past medical history before  presenting to the emergency department 3 times this   Dictation Ends at this Point      Lennis P. Darrold Span, M.D.  Electronically Signed     LPL/MEDQ  D:  03/24/2008  T:  03/24/2008  Job:  [id number]

## 2011-02-13 NOTE — Discharge Summary (Signed)
NAMECASY, Wayne Wolfe NO.:  [id number]   MEDICAL RECORD NO.:  [id number]          PATIENT TYPE:  INP   LOCATION:  1504                         [facility]ACILITY:  [facility]   PHYSICIAN:  Isidor Holts, M.D.  DATE OF BIRTH:  1966/03/07   DATE OF ADMISSION:  03/12/2008  DATE OF DISCHARGE:  03/26/2008                               DISCHARGE SUMMARY   ADDENDUM:   PRIMARY MEDICAL DOCTOR:  Unassigned.   For discharge diagnoses, consultations, procedures. admission history,  and detailed clinical course:  Refer to interim discharge summary  dictated March 17, 2008.   ADDITIONAL CONSULTATION:  1. Lennis P. Darrold Span, M.D., hematologist/oncologist.   PROCEDURES:  1. Chest x-ray dated March 19, 2008.  This showed asymmetric airspace      disease likely due to asymmetric pulmonary edema.  2. A 2-view chest x-ray dated March 24, 2008.  This showed improved      perihilar airspace disease compared to prior.   FINAL DIAGNOSES:  1. Cryptococcal meningoencephalitis.  2. Immune deficiency virus/ acquired deficiency syndrome, CD-4 count      20 viral load 340,000 copies/mL.  3. Hyponatremia.  4. Acute renal failure.  5. Fluid overload/pulmonary edema.  6. Right lower lobe pneumonia.  7. Neutropenia, multifactorial.  8. Hypertension.  9. Left carpal tunnel syndrome.   DISCHARGE MEDICATIONS:  1. Clonidine 0.1 mg p.o. b.i.d.  2. Diflucan 600 mg p.o. daily.  3. Bactrim DS 1 tablet p.o. Monday-Wednesday-Friday.  4. Azithromycin 1200 mg q. weekly on Fridays.  5. Tylenol Extra Strength 500 mg.  1-2 pills p.o. p.r.n. q.6 h.   CLINICAL COURSE:  Refer to above-mentioned interim discharge summary.  However, for the period from March 18, 2008 to the date of this dictation  March 26, 2008, the following are pertinent:  The patient was found to be  mildly short of breath on March 19, 2008.  Chest x-ray revealed  asymmetric airspace disease consistent with either asymmetric pulmonary  edema  versus pneumonia, and ntravenous fluids were discontinued.  The  patient was administered intravenous Lasix, with satisfactory clinical  response.  He was started on Avelox, and by March 26, 2008, had completed  a 7-day course of this treatment.  He had no pyrexia during this period  of time.  Likely, he was fluid overloaded from aggressive intravenous  fluid resuscitation during this hospitalization. Pneumonia however, is  less likely.  Be that as it may, BNP which was 618 on March 19, 2008, had  by March 25, 2008, dropped to 34.1.  The patient did experience mild  hypokalemia.  This was readily addressed with appropriate  supplementation.  He was managed with left wrist support and analgesics,  for left carpal tunnel syndrome.  Blood pressure was adequately controlled with Clonidine.  The patient;  however, became neutropenic with white cell count dropping from 3.6 on  March 16, 2008, to 1.9 on March 21, 2008, and an all time low of 1.1 on  March 24, 2008.  Likely this was multifactorial, against a background of  known AIDS and Amphotericin B treatment.  Amphotericin B was  discontinued.  The patient was switched to oral Diflucan and absolute  neutrophil count was low at 0.4.  Hematology consultation was called,  which was kindly provided by Dr. Jama Flavors.  For details of  consultation, refer to the consultation notes of March 24, 2008.  She  opined that this neutropenia is likely multifactorial, against a  background of AIDS, cryptococcal meningitis, improving pneumonia, and  multiple culprit medications.  She recommended continuing current  treatment, institution of HAART at the time to be determined by  infectious diseases specialist, i.e., on an outpatient basis and has  opined that there is no proven medical benefit from G-CSF in this  situation.  The patient has been commenced on prophylactic medications  by infectious diseases specialist, i.e., Bactrim DS and Azithromycin and   arrangements have been made for him to be seen in followup, at [facility] outpatient clinic with Dr. Paulette Blanch Dam.   DISPOSITION:  As of March 26, 2008 the patient was considered  sufficiently clinically recovered and stable to be discharged.  He was,  therefore, discharged accordingly.   DIET:  Heart-healthy.   ACTIVITY:  As tolerated.   FOLLOWUP INSTRUCTIONS:  The patient is recommended to follow up with Dr.  Paulette Blanch Dam, infectious diseases specialist at [facility] on April 26, 2008 at 11:00 a.m.  Appointment has already been  scheduled.  Prior to that, however, he is to undergo a nurse visit at  [facility] on April 08, 2008 at 3:00 p.m.  He has been  supplied appropriate information.      Isidor Holts, M.D.  Electronically Signed     CO/MEDQ  D:  03/26/2008  T:  03/26/2008  Job:  [id number]   cc:   Fransisco Hertz, M.D.  Fax: [phone]   Lacretia Leigh. Ninetta Lights, M.D.  Fax: [phone]   Acey Lav, MD  Fax: [phone]

## 2011-02-13 NOTE — Consult Note (Signed)
NAME:  Wayne Wolfe, Wayne Wolfe                ACCOUNT NO.:  [id number]   MEDICAL RECORD NO.:  [id number]          PATIENT TYPE:  INP   LOCATION:  1504                         FACILITY:  [facility]   PHYSICIAN:  Lennis P. Darrold Span, M.D.DATE OF BIRTH:  02-18-1966   DATE OF CONSULTATION:  03/24/2008  DATE OF DISCHARGE:                                 CONSULTATION   The patient is a 45 year old gentleman with cryptococcal meningitis  associated with newly diagnosed AIDS, seen in consultation at the  request of the hospitalist service due to neutropenia.  Infectious  disease is also involved.   The patient had no significant known past medical history before  presenting to the emergency department 3 times in June 2009 with  complaints of headache.  He was admitted on March 13, 2008 and  Cryptococcus was confirmed in the CSF on March 15, 2008.  He was begun on  amphotericin B (Abelcet) on June 14 and a high dose of Diflucan on March 19, 2008.  Earlier in the hospitalization, he was also on acyclovir,  Rocephin, Doxycycline, vancomycin, and Protonix.  He is also now on  Avelox with improving symptoms of pneumonia.  CD4 is 20 and HIV viral  load 348,000.  He has not yet begun HAART.  He has had CT of the head  and MRI of the brain on March 15, 2008, this showing mild sinus disease,  multifocal sub-cm acute cortical infarcts, and meningeal findings  consistent with meningitis.  The only other imaging has been chest x-  rays with no adenopathy and improving perihilar airspace disease today.   LABS:  March 12, 2008, white count 4.7, ANC 3.5, hemoglobin 15.7,  platelets 311,000.  March 15, 2008, white count 4.9, hemoglobin 12.8,  platelets 239,000.  June 17, white count 3,000, hemoglobin 11, platelets  164,000.  March 20, 2008 white count 1.9, ANC 0.8, hemoglobin 10.7,  platelets 205,000.  March 23, 2008, white count 1.4, hemoglobin 10.6,  platelets 239,000.  March 24, 2008, white count 1.1, ANC 0.4, hemoglobin  10.8,  and platelets 220,000.  He has no prior CBCs in the hospital EMR.   REVIEW OF SYSTEMS:  No headaches presently.  No shortness of breath or  cough.  Stools have been slightly loose x1 today.  He is up in the room  easily and has been eating.   EXAM:  He is a pleasant gentleman, looks his stated age, comfortable,  supine in bed on room air.  Temperature 98.6, heart rate 48 and regular, respirations 20, blood  pressure 167/93, 98% saturated on room air.  Pupils are equal and round, not icteric.  Mouth is moist and clear.  LUNGS:  Clear breath sounds bilaterally.  PICC line is infusing without difficulty in the right upper extremity  with the site okay.  HEART:  Regular rate and rhythm.  ABDOMEN:  Soft and nontender.  Positive bowel sounds.  LOWER EXTREMITIES:  No edema or cords.   Dr. Bonnetta Barry notes seen.  He has previously discontinued Protonix, as that  would be a possibility for the drop in white count,  and has discontinued  amphotericin B as of today for the same reason.  I have discussed with  Dr. Maurice March here now as well.   IMPRESSION/RECOMMENDATIONS:  Neutropenia, which is likely multifactorial  in a patient with new diagnoses of acquired immune deficiency syndrome  not yet on HAART, cryptococcal meningitis, and improving pneumonia  radiographically and on multiple medications.  The most important  intervention now is to stop likely medications, as Dr. Maurice March has done.  I  believe there is no proven mortality benefit from G-CSF.  Therefore,  this is not the standard of care.  If lymphoma were causing neutropenia,  I would expect other findings and he would have been more likely to have  had a pancytopenia.  I agree with neutropenic precautions now.   PLAN:  HAART as an outpatient.   Please call if I can be of assistance from here.      Lennis P. Darrold Span, M.D.  Electronically Signed     LPL/MEDQ  D:  03/24/2008  T:  03/24/2008  Job:  [id number]

## 2011-02-13 NOTE — H&P (Signed)
Wayne Wolfe, Wayne Wolfe NO.:  [id number]   MEDICAL RECORD NO.:  [id number]          PATIENT TYPE:  INP   LOCATION:  0101                         FACILITY:  [facility]   PHYSICIAN:  Della Goo, M.D. DATE OF BIRTH:  11/14/1965   DATE OF ADMISSION:  03/13/2008  DATE OF DISCHARGE:                              HISTORY & PHYSICAL   PRIMARY CARE PHYSICIAN:  Unassigned.   CHIEF COMPLAINT:  1. Headaches.  2. Fevers.   HISTORY OF PRESENT ILLNESS:  This is a 45 year old male who returns to  the emergency department for reevaluation secondary to continued severe  headaches, fevers, chills and neck stiffness.  The patient was seen in  the [facility] Emergency Department 1 week ago and again 3 days later, being  evaluated on Tuesday and had a CT scan performed, results of which had  negative findings.  The patient was given pain medication of Hydrocodone  for his headache symptoms.  The patient returned to the [facility]  emergency department on March 12, 2008, late in the evening.  He was  further evaluated and underwent a lumbar puncture at this time.  The  lumbar puncture revealed protein glucose and cell count levels  consistent with viral meningitis.  The patient reports having fevers,  chills and also reports having neck stiffness as part of his symptoms.  The patient was referred for further admission and treatment, and  treatments were initiated following the lumbar puncture.  The patient  was put on empiric antibiotic therapy of Rocephin and doxycycline.  The  doxycycline was started to cover for a possible tick bite, and the  patient was also placed on antiviral therapy of IV acyclovir.   PAST MEDICAL HISTORY:  None.   PAST SURGICAL HISTORY:  None.   MEDICATIONS:  None other than mentioned above.   ALLERGIES:  NO KNOWN DRUG ALLERGIES.   SOCIAL HISTORY:  The patient is a nonsmoker and nondrinker.   FAMILY HISTORY:  Positive for hypertension, diabetes and coronary  artery  disease in his mother.  He has three sisters that also have diabetic  disease.  His father had lung cancer and coronary artery disease.   REVIEW OF SYSTEMS:  Pertinents are mentioned above.  The patient also  reports having nausea and vomiting.  The patient does report having  photophobia.   PHYSICAL EXAMINATION:  GENERAL:  This is an obese 45 year old well-  developed male in discomfort, but no acute distress.  VITAL SIGNS:  Temperature 102.5, blood pressure 116/82, heart rate 66, respirations  20, O2 saturation 98-100%.  HEENT:  Normocephalic.  There is no scleral icterus.  Pupils are equally  round and reactive to light.  Extraocular movements are intact.  Funduscopic benign.  Oropharynx is clear.  Mucosa is dry.  NECK:  Supple, full range of motion.  No thyromegaly, adenopathy or  jugulovenous distention.  CARDIOVASCULAR:  Regular rate and rhythm.  No  murmurs, gallops or rubs.  LUNGS:  Clear to auscultation bilaterally.  ABDOMEN:  Positive bowel sounds.  Soft, nontender, nondistended.  EXTREMITIES:  Without cyanosis, clubbing or edema.  LABORATORY STUDIES:  White blood cell count 4.7, hemoglobin 15.7,  hematocrit 45.5, platelets 311, neutrophils 75% lymphocytes 16%.  Sodium  129, potassium 3.9, chloride 93, carbon dioxide 27, BUN 12, creatinine  0.94, glucose 105.  Urinalysis revealed trace ketones, but was otherwise  negative.  Mono screen negative.  Group A strep screen negative.  Albumin 3.7, AST 18, ALT 20, alkaline phosphatase 67, total bilirubin  1.5.  CSF spinal fluid from the lumbar puncture reveals a glucose level  of 51, total protein level of 101.  The cell count revealed 70 RBCs, 242  white blood cell counts of which 20% are neutrophils and 76% are  lymphocytes, and in another tube a similar pattern, and another tube  with 45 red blood cells, white blood cell count 208, neutrophils 15%  lymphocytes 81%.  The Gram-stain revealed no organisms, few white  blood  cells present, both PMNs and mononuclear.  The patient had a CT scan  performed 3 days earlier.   ASSESSMENT:  A 45 year old male being admitted with:  1. Viral meningitis.  2. Cephalgia secondary to #1.  3. Hyponatremia.  4. Dehydration.   PLAN:  The patient will be admitted and placed on contact precautions.  Empiric antibiotic therapy has been ordered with Rocephin and  doxycycline as mentioned above, and antiviral therapy of acyclovir has  been ordered.  Antiemetic therapy and pain control therapy have also  been ordered, and antipyretic therapy has also been ordered.  The  patient will be placed on DVT and GI prophylaxis.      Della Goo, M.D.  Electronically Signed     HJ/MEDQ  D:  03/13/2008  T:  03/13/2008  Job:  [id number]

## 2011-02-13 NOTE — Discharge Summary (Signed)
NAMECAILEN, Wayne Wolfe NO.:  [id number]   MEDICAL RECORD NO.:  [id number]          PATIENT TYPE:  INP   LOCATION:  3003                         FACILITY:  [facility]   PHYSICIAN:  Ileana Roup, M.D.  DATE OF BIRTH:  04/09/1966   DATE OF ADMISSION:  05/05/2008  DATE OF DISCHARGE:  05/07/2008                               DISCHARGE SUMMARY   DISCHARGE DIAGNOSES:  1. Headache.  2. 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 Acute sphenoid sinusitis.  4. Hypokalemia, resolved.  5. Neutropenia.  6. Human immunodeficiency virus, acquired immunodeficiency syndrome,      last CD4 count equals 20.   DISCHARGE MEDICATIONS:  1. Diflucan 600 mg daily.  2. Bactrim DS 1 tablet every Monday, Wednesday, and Friday.  3. Zithromax 1200 mg every Friday.  4. Reglan 10 mg p.r.n. for nausea.  5. Afrin 2-3 sprays per nostril twice daily.  6. Tylenol 650 mg p.o. q.8 h. p.r.n. for pain.   DISPOSITION AND FOLLOWUP:  The patient will follow up at the [facility] on May 12, 2008, at 10 a.m. with Deirdre Evener,  research coordinator.  He will also follow up on May 27, 2008, at  3:30 p.m. with Dr. Sampson Goon.  A BMET and CBC will be checked on May 27, 2008, as well as blood pressure and adjustment of blood pressure  medicines.   PROCEDURES PERFORMED:  1. Head CT without contrast, dated May 05, 2008, showed: No acute      intracranial abnormalities; acute sphenoid sinus inflammation.  2. Lumbar puncture, dated May 05, 2008, showed glucose of 47,      protein 43, opening pressure 13, color clear, white blood cells 5,      red blood cells 21, lymphocytes few, monocytes occasional, other      cells too few to count, CSF positive for cryptococcal antigen with      titer >256, and [place] ink stain showed encapsulated yeast, Gram      stain negative, and AFB negative.   CONSULTATIONS:  Cliffton Asters, MD, Infectious Disease  Specialist.   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  The pain was described as sharp and does radiate into the back  and front of the head.  He had a similar episode of headache in June  2009, when he was hospitalized and treated for cryptococcal meningitis  and was found to have AIDS.  Tylenol did not relieve his headache.  He  denied fever, but reported occasional chills.  He denied any changes in  vision.  No photosensitivity.  No nausea or vomiting.  No abdominal  pain.  No diarrhea.  No chest pain.  No nasal congestion.  No sinus  problems.  No hemoptysis.  He reported an occasional productive cough of  yellow sputum that has been chronic.  ADMISSION PHYSICAL EXAMINATION:  VITAL SIGNS:  Temperature 97.9, blood  pressure 115/73, pulse 101, respirations 19, and O2 saturation 99% on  room air.  GENERAL:  The patient is lying in bed, pleasant, in no apparent  distress.  EYES:  Pupils equally round and reactive to light.  ENT:  Dry mucous membranes.  No pharyngeal erythema.  NECK:  Supple.  No neck stiffness.  No lymphadenopathy.  RESPIRATORY:  Clear to auscultation bilaterally.  No wheezes, rhonchi,  or rales.  CARDIOVASCULAR:  Regular rate and rhythm.  Normal S1 and S2, 1/6  systolic murmur in mitral area.  GI:  Abdomen is soft, nontender, and nondistended with positive bowel  sounds.  EXTREMITIES:  No swelling, cyanosis, clubbing, or edema.  SKIN:  No rashes noted.  Normal turgor.  LYMPH:  No lymphadenopathy.  MUSCULOSKELETAL:  Full range of motion in all extremities.  NEUROLOGIC:  Alert and oriented x3.  Strength 5/5 throughout.  Cranial  nerves intact.  Sensation intact to soft touch.  PSYCH:  Pleasant, does not appear  depressed or anxious.   LABORATORY DATA:  On admission, sodium 136, potassium 3.4, chloride 104,  CO2 is 26, BUN 6, creatinine 0.88, and glucose 96.  White blood cell  count 2.0, hemoglobin 11.5, platelets 215, ANC 1.1, ALC 0.5, MCV 85.2,  bilirubin 0.7, and calcium 8.8.  PT 14.2, INR 1.1, PTT 38.   HOSPITAL COURSE:  1. Headache.  The original concern that his headache was related to      his cryptococcal meningitis, as the patient has a history of such      during his last admission in June.  However, lumbar puncture showed      decreased opening pressure and was relatively benign.  The      patient's headache resolved without intervention and did not recur      during the rest of his hospital stay, and he had been taking his      fluconazole as prescribed since his last admission.  Cryptococcal      antigen and [place] ink stain were positive, so Infectious Disease      was consulted and evaluated the patient.  They felt that his      sinusitis and headache were unrelated to his cryptococcal      meningitis.  They recommended that the patient be discharged home      on current antibiotic/antifungal therapy.  2. Acute sphenoid sinusitis, shown on CT on admission.  This could      have been the cause of the patient's headache.  Infectious Disease      was consulted and recommended no further antibiotics for treatment      of sinusitis, and the patient was discharged on Tylenol p.r.n. and      Afrin nose spray.  3. Hypokalemia.  The patient's potassium was 3.4 on admission.      Potassium was replaced and on the day before discharge it was 4.1.  4. Neutropenia, thought to be caused by the patient's HIV, AIDS.  The      patient will have CBC checked at follow up appointment with      Infectious Disease on May 27, 2008.  5. HIV, AIDS.  Last CD4 count in June was 20.  The patient is not      currently on HAART therapy.  Antiretroviral therapy is to be      started as outpatient after  follow up with Deirdre Evener, research  coordinator, and Dr. Sampson Goon in ID clinic.  6. Hypertension.  The patient's blood pressure was 115/73 on      admission.  Home blood pressure meds Norvasc and Catapres were      discontinued, and the blood pressure ranged from 102-130 systolic      and 64-77 diastolic during admission.  Blood pressure will be      rechecked at follow up appointment with Dr. Sampson Goon and      medications adjusted accordingly.   Day of discharge vitals:  Temperature 97.9, pulse 65, respirations 20,  blood pressure 118/74, and O2 saturation 98% on room air.  No labs were  drawn on the day of discharge.      Rufina Falco, M.D.  Electronically Signed      Ileana Roup, M.D.  Electronically Signed    JY/MEDQ  D:  05/07/2008  T:  05/08/2008  Job:  [id number]   cc:   Mick Sell, MD  Deirdre Evener, RN  Acey Lav, MD

## 2011-02-13 NOTE — Discharge Summary (Signed)
NAMEALDOUS, HOUSEL NO.:  [id number]   MEDICAL RECORD NO.:  [id number]          PATIENT TYPE:  INP   LOCATION:  1515                         FACILITY:  [facility]   PHYSICIAN:  Isidor Holts, M.D.  DATE OF BIRTH:  1966/04/25   DATE OF ADMISSION:  03/12/2008  DATE OF DISCHARGE:                               DISCHARGE SUMMARY   DATE OF DISCHARGE:  To be determined.   PRIMARY MEDICAL DOCTOR:  Unassigned.   DISCHARGE DIAGNOSES:  1. Cryptococcal meningoencephalitis.  2. Human immunodeficiency virus/acquired immunodeficiency syndrome.      CD4 count 20.  HIV viral load 348,000 copies per mL.  3. Hyponatremia, resolved.  4. Acute renal failure.   DISCHARGE MEDICATIONS:  To be listed in addendum at the time of actual  discharge.   PROCEDURES:  1. Head CT scan dated March 13, 2008.  This showed no acute      intracranial abnormalities.  No significant change since preceding      study of March 09, 2008.  2. Chest x-ray dated March 14, 2008.  This showed low lung volumes,  no      acute thoracic findings.  3. Chest x-ray dated March 14, 2008.  This showed PICC line tip in the      SVC.  4. Fluoroscopic-guided lumbar puncture dated March 13, 2008.  This was      an uncomplicated procedure.  Opening pressure of 42 cm water,  10      mL CSF obtained in 4 bottles for requested laboratory analysis.  5. Repeat fluoroscopic-guided lumbar puncture March 15, 2008.  Opening      pressure 23 cm water, approximately 30 mL of clear colorless CSF      fluid was removed.  Closing pressure was 18 cm water.  6. Brain MRI dated March 15, 2008.  This showed multifocal      subcentimeter acute cortical infarcts, borderline increased      meningeal enhancement consistent with a diagnosis of viral      meningitis.   CONSULTATIONS:  1. Dr. Johny Sax, infectious diseases specialist.  2. Dr. Lina Sayre, infectious diseases specialist.   ADMISSION HISTORY:  As in H&P notes of March 13, 2008, dictated by Dr.  Della Goo. However, in brief this is a 45 year old male, with no  significant past medical history, presenting to the emergency department  with approximately a 1-week history of headaches and fevers, associated  with neck stiffness.  The patient was seen at the emergency department 1  week prior to this presentation and subsequently, 3 days later,  following which, he had a head CT scan, which was negative for acute  findings.  He was administered Hydrocodone for headache symptoms, but  because of unremitting symptomatology, the patient represented on March 12, 2008.  He underwent a lumbar puncture, which revealed a CSF  lymphocytic pleocytosis as well as elevated protein levels,  consistent  with a presumptive diagnosis of viral meningitis.  He was therefore  commenced on empiric antibiotic therapy with Rocephin, Doxycycline as  well as Acyclovir, and admitted  for further evaluation, investigation  and management.   CLINICAL COURSE:  1. Acute cryptococcal meningoencephalitis.  For details of      presentation, refer to admission history above.  The patient's CSF      studies demonstrated the following findings:  WBC 242 with      neutrophils 20%, lymphocytes 76%.  RBC was 70.  Gram stain was      negative.  Glucose 61, total protein 101.  The patient was managed      with intravenous Vancomycin, Rocephin, Doxycycline and Acyclovir.      CSF studies were then sent out; however, the patient showed      considerable alteration in mental status with agitation and      combativeness.  Clearly he had a meningoencephalitis.  An ID      consultation was called, and was kindly provided initially by Dr.      Johny Sax, who requested viral studies, HIV test, CD4 cell      count, as well as CSF and serologic cryptococcal antigen studies.      Hepatitis A, B and C serologies were negative.  Cryptococcal      antigen was positive, with titer of 256 in the serum  and 512 in the      CSF.  [place] ink stain was positive for encapsulated yeast,      consistent with Cryptococcus neoformans.  Group A streptococcal      test was negative.  Lyme disease, i.e., Borrelia burgdorferi      antibody IgG and IgM were negative, as was [facility] spotted      fever IgM and [facility] spotted fever IgG, which was less than      1:6.4.  HIV test was positive, and subsequent CD4 cell count showed      a CD4 of 20, and HIV viral RNA was 348,000 copies per mL.  The      patient was commenced on intravenous Amphotericin, subsequently      switched per ID recommendations to Diflucan.  He will require      continued suppressive treatment, which will be lifelong.   1. HIV/AIDS.  For details, refer to #1 above.  The patient clearly has      an AIDS-defining illness as well as a low CD4 cell count and high      HIV-1 viral RNA.  He will need to be followed up at the infectious      diseases clinic at the time of discharge and commenced on      appropriate HAART, as well as prophylactic medications.   1. Acute renal failure.  The patient at the time of presentation, had      a BUN of 12 with a creatinine of 0.091; however, he became volume      depleted during the course of his hospitalization, had to be      managed with intravenous infusion of normal saline. There was a      gradual creeping up of his creatinine level to a high of 2.28 on      March 16, 2008, necessitating more aggressive intravenous fluid      hydration.  We are pleased to note that as of March 17, 2008,      creatinine had improved at 1.81.   1. Hyponatremia.  The patient presented with a hyponatremia of 129.      This was addressed with intravenous infusion of normal saline, and  as of March 17, 2008, hyponatremia has resolved with a sodium of      140.   DISPOSITION:  This will be elucidated in detail at the appropriate time,  by discharging M.D.; however, it is clear that the patient has  an AIDS-  defining illness and has established AIDS.  As mentioned above, he will  continue to follow up with infectious diseases specialist following  discharge, but overall prognosis does not appear to be encouraging.      Isidor Holts, M.D.  Electronically Signed     CO/MEDQ  D:  03/17/2008  T:  03/17/2008  Job:  [id number]

## 2011-02-20 ENCOUNTER — Ambulatory Visit (INDEPENDENT_AMBULATORY_CARE_PROVIDER_SITE_OTHER): Payer: Self-pay | Admitting: *Deleted

## 2011-02-20 ENCOUNTER — Encounter: Payer: Self-pay | Admitting: *Deleted

## 2011-02-20 VITALS — BP 119/90 | HR 74 | Temp 98.1°F | Resp 16 | Ht 68.0 in | Wt 268.8 lb

## 2011-02-20 DIAGNOSIS — B20 Human immunodeficiency virus [HIV] disease: Secondary | ICD-10-CM

## 2011-02-20 LAB — HEPATIC FUNCTION PANEL
ALT: 16 U/L (ref 0–53)
AST: 13 U/L (ref 0–37)
Albumin: 4 g/dL (ref 3.5–5.2)
Alkaline Phosphatase: 92 U/L (ref 39–117)
Indirect Bilirubin: 0.7 mg/dL (ref 0.0–0.9)
Total Protein: 7.4 g/dL (ref 6.0–8.3)

## 2011-02-20 LAB — LIPID PANEL
Cholesterol: 190 mg/dL (ref 0–200)
HDL: 26 mg/dL — ABNORMAL LOW (ref >39)
Triglycerides: 177 mg/dL — ABNORMAL HIGH (ref <150)

## 2011-02-20 LAB — BASIC METABOLIC PANEL
Calcium: 8.9 mg/dL (ref 8.4–10.5)
Creat: 1.18 mg/dL (ref 0.40–1.50)
Sodium: 139 mEq/L (ref 135–145)

## 2011-02-20 LAB — POCT URINALYSIS DIPSTICK
Glucose, UA: NEGATIVE
Nitrite, UA: NEGATIVE
Spec Grav, UA: 1.03
Urobilinogen, UA: 1

## 2011-02-20 NOTE — Progress Notes (Signed)
02/20/2011 @ 0900: Pt here for research study A5257/A5001, week 144. No new findings. Assessment unchanged since last visit. Pt had prior c/o blurred vision and stated he went to see Dr. Christophe Louis, Optometrist, and had no glaucoma or cataracts. He prescribed reading glasses and has helped his blurred vision. Pt having no problems adhering to his ARV regimen. Drew labs; Received $20.00 Gift Card. Next Study visit set for 06/12/2011 at 11am. -- Tacey Heap RN

## 2011-02-21 LAB — PROTEIN, URINE, RANDOM: Total Protein, Urine: <3 mg/dL

## 2011-02-21 LAB — CREATININE, URINE, RANDOM: Creatinine, Urine: 52.2 mg/dL

## 2011-03-12 ENCOUNTER — Ambulatory Visit (INDEPENDENT_AMBULATORY_CARE_PROVIDER_SITE_OTHER): Payer: Self-pay | Admitting: Infectious Disease

## 2011-03-12 ENCOUNTER — Encounter: Payer: Self-pay | Admitting: Infectious Disease

## 2011-03-12 DIAGNOSIS — B20 Human immunodeficiency virus [HIV] disease: Secondary | ICD-10-CM

## 2011-03-12 DIAGNOSIS — IMO0001 Reserved for inherently not codable concepts without codable children: Secondary | ICD-10-CM

## 2011-03-12 DIAGNOSIS — I10 Essential (primary) hypertension: Secondary | ICD-10-CM

## 2011-03-12 DIAGNOSIS — E119 Type 2 diabetes mellitus without complications: Secondary | ICD-10-CM

## 2011-03-12 DIAGNOSIS — E785 Hyperlipidemia, unspecified: Secondary | ICD-10-CM

## 2011-03-12 DIAGNOSIS — E1169 Type 2 diabetes mellitus with other specified complication: Secondary | ICD-10-CM | POA: Insufficient documentation

## 2011-03-12 DIAGNOSIS — IMO0002 Reserved for concepts with insufficient information to code with codable children: Secondary | ICD-10-CM

## 2011-03-12 DIAGNOSIS — E1165 Type 2 diabetes mellitus with hyperglycemia: Secondary | ICD-10-CM | POA: Insufficient documentation

## 2011-03-12 MED ORDER — PRAVASTATIN SODIUM 20 MG PO TABS: 20.0000 mg | ORAL_TABLET | Freq: Every day | ORAL | Status: DC

## 2011-03-12 MED ORDER — METFORMIN HCL 500 MG PO TABS: 500.0000 mg | ORAL_TABLET | Freq: Two times a day (BID) | ORAL | Status: DC

## 2011-03-12 NOTE — Assessment & Plan Note (Signed)
Start Pravachol. As above I may get him off of a Norvir based regimen in the future when he comes off of 5257.

## 2011-03-12 NOTE — Assessment & Plan Note (Signed)
" >>  ASSESSMENT AND PLAN FOR HUMAN IMMUNODEFICIENCY VIRUS (HIV) DISEASE (HCC) WRITTEN ON 03/12/2011  2:44 PM BY VAN DAM, Kouper Spinella N, MD  Continue current antiretrovirals regimen. In the future I would likely get him off a nor of your based regimen to reduce his cholesterol and triglycerides. "

## 2011-03-12 NOTE — Patient Instructions (Signed)
I am wrting for metformin and pravachol today these are important for controlling your diabetes and your cholesterol It would be a good idea to see Wayne Wolfe with regards to your diabetes and diet

## 2011-03-12 NOTE — Assessment & Plan Note (Signed)
Instituting metformin. He has seen ophthalmologist he'll continue to need to have his feet checked as well. I but haven't seen by Jamison Neighbor for diabetic education and also for assistance with his diet he could benefit greatly from weight loss. I suggested a low carbohydrate or

## 2011-03-12 NOTE — Progress Notes (Signed)
  Subjective:    Patient ID: Wayne Wolfe, male    DOB: Jan 31, 1966, 45 y.o.   MRN: [id number]  HPI 45 year old Afro-American male who has been on Norvir,prezista and truvada. He was found to have diabetes based on hemoglobin A1c. However the metformin I prescribed as well as the pravastatin were never started by the patient. We spent quite a bit of time today discussing the need to start metformin and the need to lower his cholesterol. The time being he'll continue on his current antiretroviral regimen well he is enrolled in the 5257 study. However the future may want to change him to a nonresponder based regimen. If not spent greater than 50% on the patient including counseling the patient and according his care. 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 There is no mention by the patient did have evidence of diabetic retinopathy at this point time.  Review of Systems    as in history of present illness otherwise 12 point review of systems is negative. Objective:   Physical Exam     Patient is alert and oriented x4. The patient is normocephalic and traumatic stools around Sclerae no edema Neurological exam  nonfocal normal gait normal strength normal sensation. Psychiatric normal nontender with linear thought processes  Assessment & Plan:  Diabetes type 2, uncontrolled Instituting metformin. He has seen ophthalmologist he'll continue to need to have his feet checked as well. I but haven't seen by Jamison Neighbor for diabetic education and also for assistance with his diet he could benefit greatly from weight loss. I suggested a low carbohydrate or  ESSENTIAL HYPERTENSION, BENIGN Reasonable control today.  AIDS Continue current antiretrovirals regimen. In the future I would likely get him off a nor of your based regimen to reduce his cholesterol and triglycerides.  Hyperlipidemia Start Pravachol. As above I may get him off of a Norvir based regimen in the future  when he comes off of 5257.

## 2011-03-12 NOTE — Assessment & Plan Note (Signed)
Reasonable control today

## 2011-03-12 NOTE — Assessment & Plan Note (Signed)
Continue current antiretrovirals regimen. In the future I would likely get him off a nor of your based regimen to reduce his cholesterol and triglycerides.

## 2011-03-14 ENCOUNTER — Encounter: Payer: Self-pay | Admitting: Infectious Disease

## 2011-03-14 LAB — CD4/CD8 (T-HELPER/T-SUPPRESSOR CELL)
CD4: 252
CD8: 974

## 2011-03-15 ENCOUNTER — Ambulatory Visit: Payer: Self-pay

## 2011-03-16 ENCOUNTER — Other Ambulatory Visit: Payer: Self-pay | Admitting: *Deleted

## 2011-03-16 DIAGNOSIS — I10 Essential (primary) hypertension: Secondary | ICD-10-CM

## 2011-03-16 DIAGNOSIS — E119 Type 2 diabetes mellitus without complications: Secondary | ICD-10-CM

## 2011-03-16 DIAGNOSIS — E785 Hyperlipidemia, unspecified: Secondary | ICD-10-CM

## 2011-03-16 MED ORDER — METFORMIN HCL 500 MG PO TABS: 500.0000 mg | ORAL_TABLET | Freq: Two times a day (BID) | ORAL | Status: DC

## 2011-03-16 MED ORDER — HYDROCHLOROTHIAZIDE 25 MG PO TABS: 25.0000 mg | ORAL_TABLET | Freq: Every day | ORAL | Status: DC

## 2011-03-16 MED ORDER — PRAVASTATIN SODIUM 20 MG PO TABS: 20.0000 mg | ORAL_TABLET | Freq: Every day | ORAL | Status: DC

## 2011-03-21 ENCOUNTER — Ambulatory Visit: Payer: Self-pay

## 2011-04-02 ENCOUNTER — Other Ambulatory Visit: Payer: Self-pay | Admitting: *Deleted

## 2011-04-02 DIAGNOSIS — B20 Human immunodeficiency virus [HIV] disease: Secondary | ICD-10-CM

## 2011-04-02 MED ORDER — RITONAVIR 100 MG PO CAPS: 100.0000 mg | ORAL_CAPSULE | Freq: Every day | ORAL | Status: DC

## 2011-05-01 ENCOUNTER — Ambulatory Visit: Payer: Self-pay

## 2011-05-07 ENCOUNTER — Other Ambulatory Visit: Payer: Self-pay | Admitting: *Deleted

## 2011-05-07 DIAGNOSIS — B20 Human immunodeficiency virus [HIV] disease: Secondary | ICD-10-CM

## 2011-05-07 MED ORDER — EMTRICITABINE-TENOFOVIR DF 200-300 MG PO TABS: 1.0000 | ORAL_TABLET | Freq: Every day | ORAL | Status: DC

## 2011-05-07 MED ORDER — DARUNAVIR ETHANOLATE 400 MG PO TABS: 800.0000 mg | ORAL_TABLET | Freq: Every day | ORAL | Status: DC

## 2011-06-12 ENCOUNTER — Encounter: Payer: Self-pay | Admitting: *Deleted

## 2011-06-12 ENCOUNTER — Ambulatory Visit (INDEPENDENT_AMBULATORY_CARE_PROVIDER_SITE_OTHER): Payer: Self-pay | Admitting: *Deleted

## 2011-06-12 VITALS — BP 111/74 | HR 76 | Temp 97.6°F | Resp 16 | Wt 256.0 lb

## 2011-06-12 DIAGNOSIS — B20 Human immunodeficiency virus [HIV] disease: Secondary | ICD-10-CM

## 2011-06-12 LAB — BASIC METABOLIC PANEL
BUN: 12 mg/dL (ref 6–23)
Chloride: 95 mEq/L — ABNORMAL LOW (ref 96–112)
Creat: 1.11 mg/dL (ref 0.50–1.35)
Glucose, Bld: 83 mg/dL (ref 70–99)
Potassium: 3.9 mEq/L (ref 3.5–5.3)

## 2011-06-12 LAB — LIPID PANEL
LDL Cholesterol: 87 mg/dL (ref 0–99)
Triglycerides: 127 mg/dL (ref <150)
VLDL: 25 mg/dL (ref 0–40)

## 2011-06-12 LAB — HEPATIC FUNCTION PANEL
ALT: 17 U/L (ref 0–53)
Bilirubin, Direct: 0.2 mg/dL (ref 0.0–0.3)
Indirect Bilirubin: 0.5 mg/dL (ref 0.0–0.9)
Total Bilirubin: 0.7 mg/dL (ref 0.3–1.2)

## 2011-06-12 LAB — PHOSPHORUS: Phosphorus: 3.3 mg/dL (ref 2.3–4.6)

## 2011-06-12 NOTE — Progress Notes (Addendum)
06/12/2011 @ 1100 Nsg: Pt here for Study A5257/A5001, week 160. Pt stated his eye site is great now that he has reading glasses. Pt has an intentional weight loss of 13 lbs since last study visit. He has been walking 30 minutes QD, since June 2012, and goals are to lose weight to better control his HTN, and new Dx of Diabetes and Hyperlipidemia. Pt started on Pravastatin and Metformin in June and this was his motivation to get healthy. Talked to Dr. Daiva Eves about getting him a blood glucose meter to help patient monitor them at home. Called pt to discuss this and he did not want to use a glucose meter at this time. Vital Signs Stable; Non fasting labs were drawn. Pt received $20.00 gift card for visit. Next appointment scheduled for Thursday, October 11, 2011 @ 11am. Tacey Heap RN II

## 2011-06-28 LAB — DIFFERENTIAL
Band Neutrophils: 0
Basophils Absolute: 0
Basophils Absolute: 0
Basophils Absolute: 0
Basophils Absolute: 0
Basophils Relative: 1
Basophils Relative: 2 — ABNORMAL HIGH
Basophils Relative: 0
Eosinophils Absolute: 0.2
Eosinophils Absolute: 0.2
Eosinophils Absolute: 0.3
Eosinophils Absolute: 0.2
Eosinophils Relative: 13 — ABNORMAL HIGH
Eosinophils Relative: 19 — ABNORMAL HIGH
Eosinophils Relative: 19 — ABNORMAL HIGH
Eosinophils Relative: 6 — ABNORMAL HIGH
Lymphocytes Relative: 16
Lymphocytes Relative: 40
Lymphocytes Relative: 44
Lymphocytes Relative: 53 — ABNORMAL HIGH
Lymphocytes Relative: 30
Lymphs Abs: 0.6 — ABNORMAL LOW
Metamyelocytes Relative: 0
Monocytes Absolute: 0.4
Monocytes Absolute: 0.1
Monocytes Absolute: 0.1
Monocytes Absolute: 0.1
Monocytes Absolute: 0.2
Monocytes Relative: 8
Monocytes Relative: 10
Monocytes Relative: 5
Neutro Abs: 3.5
Neutro Abs: 1 — ABNORMAL LOW
Neutrophils Relative %: 28 — ABNORMAL LOW
Neutrophils Relative %: 33 — ABNORMAL LOW
Neutrophils Relative %: 37 — ABNORMAL LOW
Neutrophils Relative %: 43
nRBC: 0

## 2011-06-28 LAB — BASIC METABOLIC PANEL
BUN: 14
BUN: 11
BUN: 13
BUN: 14
BUN: 17
BUN: 19
BUN: 23
BUN: 9
CO2: 27
CO2: 23
CO2: 24
CO2: 24
CO2: 26
CO2: 30
CO2: 28
Calcium: 8 — ABNORMAL LOW
Calcium: 8.1 — ABNORMAL LOW
Calcium: 8.2 — ABNORMAL LOW
Calcium: 8.5
Calcium: 8.6
Calcium: 8.6
Calcium: 9.2
Chloride: 93 — ABNORMAL LOW
Chloride: 112
Chloride: 112
Chloride: 113 — ABNORMAL HIGH
Chloride: 113 — ABNORMAL HIGH
Chloride: 103
Creatinine, Ser: 1.27
Creatinine, Ser: 1.37
Creatinine, Ser: 1.46
Creatinine, Ser: 1.46
Creatinine, Ser: 1.56 — ABNORMAL HIGH
Creatinine, Ser: 1.59 — ABNORMAL HIGH
Creatinine, Ser: 2.28 — ABNORMAL HIGH
GFR calc Af Amer: >60
GFR calc Af Amer: >60
GFR calc Af Amer: 38 — ABNORMAL LOW
GFR calc Af Amer: 58 — ABNORMAL LOW
GFR calc Af Amer: >60
GFR calc Af Amer: >60
GFR calc Af Amer: >60
GFR calc Af Amer: >60
GFR calc Af Amer: >60
GFR calc non Af Amer: 59 — ABNORMAL LOW
GFR calc non Af Amer: 48 — ABNORMAL LOW
GFR calc non Af Amer: 53 — ABNORMAL LOW
GFR calc non Af Amer: 53 — ABNORMAL LOW
GFR calc non Af Amer: 55 — ABNORMAL LOW
GFR calc non Af Amer: 57 — ABNORMAL LOW
GFR calc non Af Amer: >60
Glucose, Bld: 105 — ABNORMAL HIGH
Glucose, Bld: 103 — ABNORMAL HIGH
Glucose, Bld: 111 — ABNORMAL HIGH
Glucose, Bld: 113 — ABNORMAL HIGH
Glucose, Bld: 94
Glucose, Bld: 105 — ABNORMAL HIGH
Potassium: 3.9
Potassium: 4
Potassium: 3 — ABNORMAL LOW
Potassium: 3.2 — ABNORMAL LOW
Potassium: 3.2 — ABNORMAL LOW
Potassium: 3.4 — ABNORMAL LOW
Potassium: 3.5
Potassium: 3.6
Potassium: 3.7
Potassium: 3.1 — ABNORMAL LOW
Sodium: 129 — ABNORMAL LOW
Sodium: 131 — ABNORMAL LOW
Sodium: 140
Sodium: 141
Sodium: 142
Sodium: 145
Sodium: 140

## 2011-06-28 LAB — T-HELPER CELLS (CD4) COUNT (NOT AT ARMC)
CD4 % Helper T Cell: 12 — ABNORMAL LOW
CD4 T Cell Abs: 20 — ABNORMAL LOW

## 2011-06-28 LAB — HEPATIC FUNCTION PANEL
ALT: 20
AST: 18
Alkaline Phosphatase: 67
Bilirubin, Direct: 0.2
Indirect Bilirubin: 1.3 — ABNORMAL HIGH
Total Bilirubin: 1.5 — ABNORMAL HIGH

## 2011-06-28 LAB — URINALYSIS, ROUTINE W REFLEX MICROSCOPIC
Protein, ur: NEGATIVE
Specific Gravity, Urine: 1.023
Urobilinogen, UA: 4 — ABNORMAL HIGH
pH: 6

## 2011-06-28 LAB — CULTURE, BLOOD (ROUTINE X 2)

## 2011-06-28 LAB — CSF CELL COUNT WITH DIFFERENTIAL
Lymphs, CSF: 76
Lymphs, CSF: 81 — ABNORMAL HIGH
Monocyte-Macrophage-Spinal Fluid: 4 — ABNORMAL LOW
RBC Count, CSF: 70 — ABNORMAL HIGH
Segmented Neutrophils-CSF: 15 — ABNORMAL HIGH
Tube #: 1
WBC, CSF: 208 — ABNORMAL HIGH

## 2011-06-28 LAB — CBC
HCT: 43.1
HCT: 45.5
HCT: 31.5 — ABNORMAL LOW
HCT: 31.8 — ABNORMAL LOW
HCT: 31.8 — ABNORMAL LOW
HCT: 38 — ABNORMAL LOW
HCT: 37.4 — ABNORMAL LOW
Hemoglobin: 15.7
Hemoglobin: 11 — ABNORMAL LOW
Hemoglobin: 11.9 — ABNORMAL LOW
Hemoglobin: 12.8 — ABNORMAL LOW
Hemoglobin: 12.5 — ABNORMAL LOW
MCHC: 34.4
MCHC: 33.3
MCHC: 33.7
MCHC: 33.8
MCHC: 34.3
MCHC: 33.5
MCV: 83.5
MCV: 82
MCV: 82.6
MCV: 83.7
MCV: 84.1
MCV: 83.1
Platelets: 263
Platelets: 205
Platelets: 205
Platelets: 220
Platelets: 244
Platelets: 256
RBC: 5.13
RBC: 5.45
RBC: 3.8 — ABNORMAL LOW
RBC: 3.82 — ABNORMAL LOW
RBC: 3.85 — ABNORMAL LOW
RBC: 4.04 — ABNORMAL LOW
RBC: 4.23
RBC: 4.29
RBC: 4.5
RDW: 12.7
RDW: 12.7
RDW: 12.8
RDW: 12.8
RDW: 13.4
RDW: 13.7
RDW: 14.2
WBC: 6.2
WBC: 1.4 — CL
WBC: 1.6 — ABNORMAL LOW
WBC: 1.9 — ABNORMAL LOW
WBC: 2.2 — ABNORMAL LOW
WBC: 3 — ABNORMAL LOW

## 2011-06-28 LAB — HSV PCR: HSV, PCR: NOT DETECTED

## 2011-06-28 LAB — GRAM STAIN: Gram Stain: NONE SEEN

## 2011-06-28 LAB — HIV-1 GENOTYPR PLUS

## 2011-06-28 LAB — HEPATITIS PANEL, ACUTE
Hep A IgM: NEGATIVE
Hep B C IgM: NEGATIVE
Hep B C IgM: NEGATIVE
Hepatitis B Surface Ag: NEGATIVE

## 2011-06-28 LAB — CSF CULTURE W GRAM STAIN

## 2011-06-28 LAB — URINE DRUGS OF ABUSE SCREEN W ALC, ROUTINE (REF LAB)
Amphetamine Screen, Ur: NEGATIVE
Cocaine Metabolites: NEGATIVE
Creatinine,U: 102
Ethyl Alcohol: <10
Phencyclidine (PCP): NEGATIVE

## 2011-06-28 LAB — B. BURGDORFI ANTIBODIES, CSF: Lyme Ab: 0.51 LIV

## 2011-06-28 LAB — COMPREHENSIVE METABOLIC PANEL
ALT: 17
BUN: 22
Calcium: 8.1 — ABNORMAL LOW
Chloride: 102
GFR calc non Af Amer: 34 — ABNORMAL LOW
Glucose, Bld: 104 — ABNORMAL HIGH
Total Bilirubin: 0.9
Total Protein: 6.5

## 2011-06-28 LAB — INDIA INK, CSF (CRYPTOCOCCUS) (CONVERTED LAB)

## 2011-06-28 LAB — CRYPTOCOCCAL ANTIGEN, CSF: Cryptococcal Ag Titer: >256 — AB

## 2011-06-28 LAB — ENTEROVIRUS PCR: Enterovirus PCR: NEGATIVE

## 2011-06-28 LAB — FUNGUS CULTURE W SMEAR

## 2011-06-28 LAB — BLOOD GAS, ARTERIAL
Acid-Base Excess: 0
Drawn by: 14519
FIO2: 0.21
O2 Saturation: 94.5
Patient temperature: 102
TCO2: 20.9
pCO2 arterial: 39.6
pH, Arterial: 7.429
pO2, Arterial: 88.5

## 2011-06-28 LAB — PATHOLOGIST SMEAR REVIEW

## 2011-06-28 LAB — HIV-1 RNA QUANT-NO REFLEX-BLD: HIV-1 RNA Quant, Log: 5.99 — ABNORMAL HIGH (ref <1.70)

## 2011-06-28 LAB — PROTEIN, CSF: Total  Protein, CSF: 101 — ABNORMAL HIGH

## 2011-06-28 LAB — MAGNESIUM: Magnesium: 1.9

## 2011-06-28 LAB — HIV ANTIBODY (ROUTINE TESTING W REFLEX): HIV: REACTIVE — AB

## 2011-06-28 LAB — B-NATRIURETIC PEPTIDE (CONVERTED LAB)
Pro B Natriuretic peptide (BNP): 427 — ABNORMAL HIGH
Pro B Natriuretic peptide (BNP): 618 — ABNORMAL HIGH
Pro B Natriuretic peptide (BNP): 88.1
Pro B Natriuretic peptide (BNP): <30

## 2011-06-28 LAB — HIV 1/2 CONFIRMATION
HIV-1 antibody: POSITIVE
HIV-2 Ab: NEGATIVE

## 2011-06-28 LAB — RPR: RPR Ser Ql: NONREACTIVE

## 2011-06-28 LAB — CRYPTOCOCCAL ANTIGEN: Cryptococcal Ag Titer: >512 — AB

## 2011-06-28 LAB — ROCKY MTN SPOTTED FVR AB, IGM-BLOOD: RMSF IgM: 0.25 IV

## 2011-06-29 LAB — CSF CELL COUNT WITH DIFFERENTIAL
RBC Count, CSF: 21 — ABNORMAL HIGH
WBC, CSF: 5

## 2011-06-29 LAB — CSF CULTURE W GRAM STAIN: Culture: NO GROWTH

## 2011-06-29 LAB — COMPREHENSIVE METABOLIC PANEL
Alkaline Phosphatase: 61
BUN: 6
Chloride: 105
Creatinine, Ser: 0.99
GFR calc non Af Amer: >60
Glucose, Bld: 137 — ABNORMAL HIGH
Potassium: 4.1
Total Bilirubin: 0.6

## 2011-06-29 LAB — BASIC METABOLIC PANEL
CO2: 26
Calcium: 8.9
Chloride: 104
Creatinine, Ser: 0.88
GFR calc Af Amer: >60
GFR calc non Af Amer: >60
Glucose, Bld: 145 — ABNORMAL HIGH
Glucose, Bld: 94
Sodium: 137

## 2011-06-29 LAB — CBC
Hemoglobin: 11.3 — ABNORMAL LOW
Hemoglobin: 11.5 — ABNORMAL LOW
MCHC: 33.8
MCV: 85.2
RBC: 4 — ABNORMAL LOW
RDW: 15
RDW: 15.1

## 2011-06-29 LAB — AFB CULTURE WITH SMEAR (NOT AT ARMC)

## 2011-06-29 LAB — INDIA INK, CSF (CRYPTOCOCCUS) (CONVERTED LAB)

## 2011-06-29 LAB — DIFFERENTIAL
Basophils Absolute: 0
Basophils Relative: 1
Eosinophils Absolute: 0.2
Monocytes Absolute: 0.2
Monocytes Relative: 9
Neutrophils Relative %: 54

## 2011-06-29 LAB — CRYPTOCOCCAL ANTIGEN, CSF: Cryptococcal Ag Titer: >256 — AB

## 2011-06-29 LAB — CULTURE, BLOOD (ROUTINE X 2): Culture: NO GROWTH

## 2011-06-29 LAB — PROTEIN, CSF: Total  Protein, CSF: 43

## 2011-06-29 LAB — BILIRUBIN, FRACTIONATED(TOT/DIR/INDIR): Indirect Bilirubin: 0.6

## 2011-06-29 LAB — MAGNESIUM: Magnesium: 2

## 2011-06-30 ENCOUNTER — Other Ambulatory Visit: Payer: Self-pay | Admitting: Infectious Disease

## 2011-07-02 ENCOUNTER — Other Ambulatory Visit: Payer: Self-pay | Admitting: *Deleted

## 2011-07-02 DIAGNOSIS — I10 Essential (primary) hypertension: Secondary | ICD-10-CM

## 2011-07-02 DIAGNOSIS — E785 Hyperlipidemia, unspecified: Secondary | ICD-10-CM

## 2011-07-02 DIAGNOSIS — E119 Type 2 diabetes mellitus without complications: Secondary | ICD-10-CM

## 2011-07-02 MED ORDER — METFORMIN HCL 500 MG PO TABS: 500.0000 mg | ORAL_TABLET | Freq: Two times a day (BID) | ORAL | Status: DC

## 2011-07-02 MED ORDER — PRAVASTATIN SODIUM 20 MG PO TABS: 20.0000 mg | ORAL_TABLET | Freq: Every day | ORAL | Status: DC

## 2011-07-02 MED ORDER — HYDROCHLOROTHIAZIDE 25 MG PO TABS: 25.0000 mg | ORAL_TABLET | Freq: Every day | ORAL | Status: DC

## 2011-07-05 ENCOUNTER — Encounter: Payer: Self-pay | Admitting: Infectious Disease

## 2011-07-05 LAB — CD4/CD8 (T-HELPER/T-SUPPRESSOR CELL)
CD4: 282
CD8 % Suppressor T Cell: 57.1

## 2011-07-05 LAB — HIV-1 RNA QUANT-NO REFLEX-BLD: HIV-1 RNA Viral Load: <40

## 2011-08-08 ENCOUNTER — Telehealth: Payer: Self-pay | Admitting: *Deleted

## 2011-08-08 NOTE — Telephone Encounter (Signed)
Wayne Wolfe spoke to me after the CAB meeting about his weight loss. Pt started walking 30 minutes per day and has stopped a couple weeks ago. He states he is still losing weight and wonders why. Documented weight on 02/20/11 (268.75 lbs) and 06/12/11 (256.0 lbs): an intentional 12.75 lb weight loss over 3.5 months. I do not know what his current weight is. Pt does c/o of diarrhea "off and on" about twice per day and heart palpitations since he started Metformin. Denies any nausea, vomiting, fever, chills, SOB, weakness, or cramping. I told Brenson I would inform you of his symptoms and see if he needs to come in before his next appointment on Monday 08/13/11 at 9:45am. Tacey Heap RN

## 2011-08-09 NOTE — Telephone Encounter (Signed)
Wayne Wolfe, I cant send Wayne Wolfe messages thru this computer right now but can you tell her that I dont think Wayne Wolfe needs to come in until his appt unless something changes

## 2011-08-13 ENCOUNTER — Encounter: Payer: Self-pay | Admitting: Infectious Disease

## 2011-08-13 ENCOUNTER — Ambulatory Visit (INDEPENDENT_AMBULATORY_CARE_PROVIDER_SITE_OTHER): Payer: Self-pay | Admitting: Infectious Disease

## 2011-08-13 VITALS — BP 121/82 | HR 99 | Temp 98.1°F | Wt 246.0 lb

## 2011-08-13 DIAGNOSIS — B20 Human immunodeficiency virus [HIV] disease: Secondary | ICD-10-CM

## 2011-08-13 DIAGNOSIS — R634 Abnormal weight loss: Secondary | ICD-10-CM

## 2011-08-13 DIAGNOSIS — IMO0001 Reserved for inherently not codable concepts without codable children: Secondary | ICD-10-CM

## 2011-08-13 DIAGNOSIS — Z23 Encounter for immunization: Secondary | ICD-10-CM

## 2011-08-13 DIAGNOSIS — R197 Diarrhea, unspecified: Secondary | ICD-10-CM

## 2011-08-13 DIAGNOSIS — E1165 Type 2 diabetes mellitus with hyperglycemia: Secondary | ICD-10-CM

## 2011-08-13 DIAGNOSIS — E785 Hyperlipidemia, unspecified: Secondary | ICD-10-CM

## 2011-08-13 LAB — TSH: TSH: 0.47 u[IU]/mL (ref 0.350–4.500)

## 2011-08-13 NOTE — Assessment & Plan Note (Signed)
Continue ARVs, excellent control. Consider non PI regimen when coming off 5257

## 2011-08-13 NOTE — Assessment & Plan Note (Signed)
May have helped his DMII, I think this is due to diet changes. Encouraged him to exercise as well. WIll check tsh for thoroughness

## 2011-08-13 NOTE — Assessment & Plan Note (Signed)
" >>  ASSESSMENT AND PLAN FOR HUMAN IMMUNODEFICIENCY VIRUS (HIV) DISEASE (HCC) WRITTEN ON 08/13/2011 10:56 AM BY VAN DAM, Yamileth Hayse N, MD  Continue ARVs, excellent control. Consider non PI regimen when coming off 5257 "

## 2011-08-13 NOTE — Progress Notes (Signed)
  Subjective:    Patient ID: Wayne Wolfe, male    DOB: March 11, 1966, 45 y.o.   MRN: [id number]  HPI  Wayne Wolfe is a 45 y.o. male who is doing superbly well on their antiviral regimen, prezista, norvir and truvada with undetectable viral load and health cd4 count. He also has diabetes and has been taking metfomin 500mg  twice daily. He has lost 30# over the past several months since he changed from drinking carbohydrate laden soft drinks and tea to water. He has not had fevers, chills or clear cut night sweats but occasionally has had sweatiness. He is otherwise doing well.   Review of Systems  Constitutional: Positive for unexpected weight change. Negative for fever, chills, diaphoresis, activity change, appetite change and fatigue.  HENT: Negative for congestion, sore throat, rhinorrhea, sneezing, trouble swallowing and sinus pressure.   Eyes: Negative for photophobia and visual disturbance.  Respiratory: Negative for cough, chest tightness, shortness of breath, wheezing and stridor.   Cardiovascular: Negative for chest pain, palpitations and leg swelling.  Gastrointestinal: Negative for nausea, vomiting, abdominal pain, diarrhea, constipation, blood in stool, abdominal distention and anal bleeding.  Genitourinary: Negative for dysuria, hematuria, flank pain and difficulty urinating.  Musculoskeletal: Negative for myalgias, back pain, joint swelling, arthralgias and gait problem.  Skin: Negative for color change, pallor, rash and wound.  Neurological: Negative for dizziness, tremors, weakness and light-headedness.  Hematological: Negative for adenopathy. Does not bruise/bleed easily.  Psychiatric/Behavioral: Negative for behavioral problems, confusion, sleep disturbance, dysphoric mood, decreased concentration and agitation.       Objective:   Physical Exam  Constitutional: He is oriented to person, place, and time. He appears well-developed and well-nourished. No distress.  HENT:    Head: Normocephalic and atraumatic.  Mouth/Throat: Oropharynx is clear and moist. No oropharyngeal exudate.  Eyes: Conjunctivae and EOM are normal. Pupils are equal, round, and reactive to light. No scleral icterus.  Neck: Normal range of motion. Neck supple. No JVD present.  Cardiovascular: Normal rate, regular rhythm and normal heart sounds.  Exam reveals no gallop and no friction rub.   No murmur heard. Pulmonary/Chest: Effort normal and breath sounds normal. No respiratory distress. He has no wheezes. He has no rales. He exhibits no tenderness.  Abdominal: He exhibits no distension and no mass. There is no tenderness. There is no rebound and no guarding.  Musculoskeletal: He exhibits no edema and no tenderness.  Lymphadenopathy:    He has no cervical adenopathy.  Neurological: He is alert and oriented to person, place, and time. He has normal reflexes. He exhibits normal muscle tone. Coordination normal.  Skin: Skin is warm and dry. He is not diaphoretic. No erythema. No pallor.  Psychiatric: He has a normal mood and affect. His behavior is normal. Judgment and thought content normal.          Assessment & Plan:  AIDS Continue ARVs, excellent control. Consider non PI regimen when coming off 5257  Weight loss May have helped his DMII, I think this is due to diet changes. Encouraged him to exercise as well. WIll check tsh for thoroughness  Hyperlipidemia At goal, again consider non PI regimen in future  Diabetes type 2, uncontrolled Check a1c. If in normal range may consider trial off of his metformin since it is continuing to cause him diarrhea  Diarrhea Due to metformin. Will see what A1c shows. If he still needs it can try some imodium to amelerioate this

## 2011-08-13 NOTE — Assessment & Plan Note (Signed)
Due to metformin. Will see what A1c shows. If he still needs it can try some imodium to amelerioate this

## 2011-08-13 NOTE — Assessment & Plan Note (Signed)
Check a1c. If in normal range may consider trial off of his metformin since it is continuing to cause him diarrhea

## 2011-08-13 NOTE — Assessment & Plan Note (Signed)
At goal, again consider non PI regimen in future

## 2011-10-11 ENCOUNTER — Ambulatory Visit: Payer: Self-pay

## 2011-10-11 ENCOUNTER — Ambulatory Visit (INDEPENDENT_AMBULATORY_CARE_PROVIDER_SITE_OTHER): Payer: Self-pay | Admitting: *Deleted

## 2011-10-11 VITALS — BP 110/92 | HR 76 | Temp 98.5°F | Resp 16 | Wt 254.5 lb

## 2011-10-11 DIAGNOSIS — B20 Human immunodeficiency virus [HIV] disease: Secondary | ICD-10-CM

## 2011-10-11 LAB — COMPREHENSIVE METABOLIC PANEL
AST: 12 U/L (ref 0–37)
Albumin: 4.4 g/dL (ref 3.5–5.2)
Alkaline Phosphatase: 68 U/L (ref 39–117)
Chloride: 99 mEq/L (ref 96–112)
Glucose, Bld: 84 mg/dL (ref 70–99)
Potassium: 4.4 mEq/L (ref 3.5–5.3)
Sodium: 138 mEq/L (ref 135–145)
Total Protein: 7.3 g/dL (ref 6.0–8.3)

## 2011-10-11 LAB — LIPID PANEL
Cholesterol: 159 mg/dL (ref 0–200)
VLDL: 30 mg/dL (ref 0–40)

## 2011-10-11 NOTE — Progress Notes (Signed)
Patient here for his week 176 study appt. He denies any new problems, but he continues to have bad night sweats and numbness/pain in his feet.Marland Kitchen He has been very adherent with his meds. He will return in May for the next study appt.

## 2011-10-26 ENCOUNTER — Encounter: Payer: Self-pay | Admitting: Infectious Disease

## 2011-10-26 LAB — CD4/CD8 (T-HELPER/T-SUPPRESSOR CELL): CD4%: 16

## 2011-11-09 ENCOUNTER — Ambulatory Visit: Payer: Self-pay

## 2011-11-26 ENCOUNTER — Other Ambulatory Visit: Payer: Self-pay | Admitting: *Deleted

## 2011-11-26 DIAGNOSIS — E785 Hyperlipidemia, unspecified: Secondary | ICD-10-CM

## 2011-11-26 DIAGNOSIS — B20 Human immunodeficiency virus [HIV] disease: Secondary | ICD-10-CM

## 2011-11-26 DIAGNOSIS — I10 Essential (primary) hypertension: Secondary | ICD-10-CM

## 2011-11-26 MED ORDER — EMTRICITABINE-TENOFOVIR DF 200-300 MG PO TABS: 1.0000 | ORAL_TABLET | Freq: Every day | ORAL | Status: DC

## 2011-11-26 MED ORDER — HYDROCHLOROTHIAZIDE 25 MG PO TABS: 25.0000 mg | ORAL_TABLET | Freq: Every day | ORAL | Status: DC

## 2011-11-26 MED ORDER — DARUNAVIR ETHANOLATE 800 MG PO TABS: 640000.0000 mg | ORAL_TABLET | Freq: Every day | ORAL | Status: DC

## 2011-11-26 MED ORDER — RITONAVIR 100 MG PO CAPS: 100.0000 mg | ORAL_CAPSULE | Freq: Every day | ORAL | Status: DC

## 2011-11-26 MED ORDER — PRAVASTATIN SODIUM 20 MG PO TABS: 20.0000 mg | ORAL_TABLET | Freq: Every day | ORAL | Status: DC

## 2011-11-29 ENCOUNTER — Other Ambulatory Visit: Payer: Self-pay | Admitting: *Deleted

## 2011-11-29 DIAGNOSIS — B20 Human immunodeficiency virus [HIV] disease: Secondary | ICD-10-CM

## 2011-11-29 DIAGNOSIS — I10 Essential (primary) hypertension: Secondary | ICD-10-CM

## 2011-11-29 DIAGNOSIS — E785 Hyperlipidemia, unspecified: Secondary | ICD-10-CM

## 2011-11-29 MED ORDER — RITONAVIR 100 MG PO CAPS: 100.0000 mg | ORAL_CAPSULE | Freq: Every day | ORAL | Status: DC

## 2011-11-29 MED ORDER — DARUNAVIR ETHANOLATE 800 MG PO TABS: 800.0000 mg | ORAL_TABLET | Freq: Every day | ORAL | Status: DC

## 2011-11-29 MED ORDER — PRAVASTATIN SODIUM 20 MG PO TABS: 20.0000 mg | ORAL_TABLET | Freq: Every day | ORAL | Status: DC

## 2011-11-29 MED ORDER — HYDROCHLOROTHIAZIDE 25 MG PO TABS: 25.0000 mg | ORAL_TABLET | Freq: Every day | ORAL | Status: DC

## 2011-11-29 MED ORDER — EMTRICITABINE-TENOFOVIR DF 200-300 MG PO TABS: 1.0000 | ORAL_TABLET | Freq: Every day | ORAL | Status: DC

## 2012-01-30 ENCOUNTER — Ambulatory Visit (INDEPENDENT_AMBULATORY_CARE_PROVIDER_SITE_OTHER): Payer: Self-pay | Admitting: *Deleted

## 2012-01-30 VITALS — BP 121/91 | HR 60 | Temp 97.8°F | Resp 16 | Ht 68.0 in | Wt 265.0 lb

## 2012-01-30 DIAGNOSIS — B20 Human immunodeficiency virus [HIV] disease: Secondary | ICD-10-CM

## 2012-01-30 DIAGNOSIS — Z21 Asymptomatic human immunodeficiency virus [HIV] infection status: Secondary | ICD-10-CM

## 2012-01-30 LAB — POCT URINALYSIS DIPSTICK
Bilirubin, UA: NEGATIVE
Ketones, UA: NEGATIVE
Leukocytes, UA: NEGATIVE
pH, UA: 6.5

## 2012-01-30 LAB — LIPID PANEL: HDL: 26 mg/dL — ABNORMAL LOW (ref >39)

## 2012-01-30 LAB — HEPATIC FUNCTION PANEL
ALT: 14 U/L (ref 0–53)
AST: 15 U/L (ref 0–37)
Albumin: 4 g/dL (ref 3.5–5.2)
Bilirubin, Direct: 0.2 mg/dL (ref 0.0–0.3)
Total Protein: 6.9 g/dL (ref 6.0–8.3)

## 2012-01-30 LAB — BASIC METABOLIC PANEL
Calcium: 8.9 mg/dL (ref 8.4–10.5)
Glucose, Bld: 101 mg/dL — ABNORMAL HIGH (ref 70–99)
Sodium: 139 mEq/L (ref 135–145)

## 2012-01-30 LAB — PHOSPHORUS: Phosphorus: 3.6 mg/dL (ref 2.3–4.6)

## 2012-01-30 LAB — HEPATITIS B SURFACE ANTIGEN: Hepatitis B Surface Ag: NEGATIVE

## 2012-01-30 LAB — HIV-1 RNA QUANT-NO REFLEX-BLD: HIV-1 RNA Viral Load: <40

## 2012-01-30 LAB — HEPATITIS C ANTIBODY: HCV Ab: NEGATIVE

## 2012-01-30 NOTE — Progress Notes (Signed)
Pt here for A5257 (final visit) and A5001. Discussed thank you letter to study participants for both studies, answered questions, and pt verbalized understanding, signed letters, and received copies. This will be his final study visit for A5257 and he will have one more visit for A5001. Pt is eligible to enroll into HAILO study and participant does want to enroll once it opens in Sept. Assessment is unchanged from last study visit. He has gained 10.5 lbs since last study visit; pt stated did stopped exercising. Pt continues to have night sweats and numbness in feet bilat. Fasting labs were drawn; Vital signs stable: diastolic a little elevated but pt stated he had not taken his HCTZ this am. Pt received $20.00 gift card for study visit. I scheduled him an appt with Dr. Daiva Eves on 5/24 for a follow up. Next study visit scheduled for Tuesday, Aug 20th at 10am. Tacey Heap RN

## 2012-01-31 LAB — CREATININE, URINE, RANDOM: Creatinine, Urine: 242 mg/dL

## 2012-02-22 ENCOUNTER — Encounter: Payer: Self-pay | Admitting: Infectious Disease

## 2012-02-22 ENCOUNTER — Ambulatory Visit (INDEPENDENT_AMBULATORY_CARE_PROVIDER_SITE_OTHER): Payer: Self-pay | Admitting: Infectious Disease

## 2012-02-22 VITALS — BP 131/85 | HR 91 | Temp 97.9°F | Ht 67.5 in | Wt 265.0 lb

## 2012-02-22 DIAGNOSIS — I10 Essential (primary) hypertension: Secondary | ICD-10-CM

## 2012-02-22 DIAGNOSIS — B20 Human immunodeficiency virus [HIV] disease: Secondary | ICD-10-CM

## 2012-02-22 DIAGNOSIS — IMO0001 Reserved for inherently not codable concepts without codable children: Secondary | ICD-10-CM

## 2012-02-22 DIAGNOSIS — J329 Chronic sinusitis, unspecified: Secondary | ICD-10-CM

## 2012-02-22 MED ORDER — EFAVIRENZ-EMTRICITAB-TENOFOVIR 600-200-300 MG PO TABS: 1.0000 | ORAL_TABLET | Freq: Every day | ORAL | Status: DC

## 2012-02-22 NOTE — Assessment & Plan Note (Signed)
Continue HCTZ

## 2012-02-22 NOTE — Progress Notes (Signed)
  Subjective:    Patient ID: Wayne Wolfe, male    DOB: [birth year], 46 y.o.   MRN: [id number]  HPI  46 year old man with superbly controlled HIV on prezista, norvir and truvada. He has had difficulty getting his CD4 count much higher than 200 after nearly 3 years of therapy. He is coming off of ACTG 5257 and therefore wishes to consider ARV;s. We discussed all first line therapies and we decided to switch him to [place]. He is also recovering from viral URI that is improving with OTC meds. I spent greater than 45 minutes with the patient including greater than 50% of time in face to face counsel of the patient and in coordination of their care.   Review of Systems  Constitutional: Negative for fever, chills, diaphoresis, activity change, appetite change, fatigue and unexpected weight change.  HENT: Positive for congestion. Negative for sore throat, rhinorrhea, sneezing, trouble swallowing and sinus pressure.   Eyes: Negative for photophobia and visual disturbance.  Respiratory: Negative for cough, chest tightness, shortness of breath, wheezing and stridor.   Cardiovascular: Negative for chest pain, palpitations and leg swelling.  Gastrointestinal: Negative for nausea, vomiting, abdominal pain, diarrhea, constipation, blood in stool, abdominal distention and anal bleeding.  Genitourinary: Negative for dysuria, hematuria, flank pain and difficulty urinating.  Musculoskeletal: Negative for myalgias, back pain, joint swelling, arthralgias and gait problem.  Skin: Negative for color change, pallor, rash and wound.  Neurological: Negative for dizziness, tremors, weakness and light-headedness.  Hematological: Negative for adenopathy. Does not bruise/bleed easily.  Psychiatric/Behavioral: Negative for behavioral problems, confusion, sleep disturbance, dysphoric mood, decreased concentration and agitation.       Objective:   Physical Exam  Constitutional: He is oriented to person, place, and time.  He appears well-developed and well-nourished. No distress.  HENT:  Head: Normocephalic and atraumatic.  Mouth/Throat: Oropharynx is clear and moist. No oropharyngeal exudate.  Eyes: Conjunctivae and EOM are normal. Pupils are equal, round, and reactive to light. No scleral icterus.  Neck: Normal range of motion. Neck supple. No JVD present.  Cardiovascular: Normal rate, regular rhythm and normal heart sounds.  Exam reveals no gallop and no friction rub.   No murmur heard. Pulmonary/Chest: Effort normal and breath sounds normal. No respiratory distress. He has no wheezes. He has no rales. He exhibits no tenderness.  Abdominal: He exhibits no distension and no mass. There is no tenderness. There is no rebound and no guarding.  Musculoskeletal: He exhibits no edema and no tenderness.  Lymphadenopathy:    He has no cervical adenopathy.  Neurological: He is alert and oriented to person, place, and time. He has normal reflexes. He exhibits normal muscle tone. Coordination normal.  Skin: Skin is warm and dry. He is not diaphoretic. No erythema. No pallor.  Psychiatric: He has a normal mood and affect. His behavior is normal. Judgment and thought content normal.          Assessment & Plan:  AIDS Change to atripla  Sinusitis Viral sinusitis improving with OTC meds  ESSENTIAL HYPERTENSION, BENIGN Continue HCTZ

## 2012-02-22 NOTE — Assessment & Plan Note (Signed)
" >>  ASSESSMENT AND PLAN FOR HUMAN IMMUNODEFICIENCY VIRUS (HIV) DISEASE (HCC) WRITTEN ON 02/22/2012  2:04 PM BY VAN DAM, Keidan Aumiller N, MD  Change to atripla "

## 2012-02-22 NOTE — Assessment & Plan Note (Signed)
Viral sinusitis improving with OTC meds

## 2012-02-22 NOTE — Assessment & Plan Note (Signed)
Change to atripla

## 2012-02-26 ENCOUNTER — Telehealth: Payer: Self-pay | Admitting: Infectious Disease

## 2012-02-26 MED ORDER — METFORMIN HCL 500 MG PO TABS: 500.0000 mg | ORAL_TABLET | Freq: Two times a day (BID) | ORAL | Status: DC

## 2012-02-26 NOTE — Telephone Encounter (Signed)
Patient with elevated a1c. Will restart metformin

## 2012-02-27 ENCOUNTER — Telehealth: Payer: Self-pay | Admitting: *Deleted

## 2012-02-27 NOTE — Telephone Encounter (Signed)
Dr. Daiva Eves Has resumed patient's metformin. I called him and left him a message about the prescription being called in to [facility].

## 2012-03-07 ENCOUNTER — Encounter: Payer: Self-pay | Admitting: Infectious Disease

## 2012-04-22 ENCOUNTER — Ambulatory Visit: Payer: Self-pay

## 2012-04-22 ENCOUNTER — Other Ambulatory Visit: Payer: Self-pay

## 2012-04-22 DIAGNOSIS — B20 Human immunodeficiency virus [HIV] disease: Secondary | ICD-10-CM

## 2012-04-22 LAB — CBC WITH DIFFERENTIAL/PLATELET
Basophils Relative: 3 % — ABNORMAL HIGH (ref 0–1)
Eosinophils Absolute: 0.2 10*3/uL (ref 0.0–0.7)
Hemoglobin: 16.8 g/dL (ref 13.0–17.0)
MCH: 29.7 pg (ref 26.0–34.0)
MCHC: 34.2 g/dL (ref 30.0–36.0)
Monocytes Relative: 11 % (ref 3–12)
Neutrophils Relative %: 27 % — ABNORMAL LOW (ref 43–77)
RDW: 15 % (ref 11.5–15.5)

## 2012-04-22 LAB — COMPLETE METABOLIC PANEL WITH GFR
ALT: 22 U/L (ref 0–53)
Albumin: 4.1 g/dL (ref 3.5–5.2)
CO2: 30 mEq/L (ref 19–32)
Calcium: 9.4 mg/dL (ref 8.4–10.5)
Chloride: 101 mEq/L (ref 96–112)
GFR, Est African American: 85 mL/min
Potassium: 4.2 mEq/L (ref 3.5–5.3)
Sodium: 140 mEq/L (ref 135–145)
Total Bilirubin: 0.4 mg/dL (ref 0.3–1.2)
Total Protein: 7 g/dL (ref 6.0–8.3)

## 2012-04-23 LAB — RPR

## 2012-04-24 LAB — HIV-1 RNA QUANT-NO REFLEX-BLD: HIV 1 RNA Quant: 182 copies/mL — ABNORMAL HIGH (ref <20)

## 2012-05-07 ENCOUNTER — Ambulatory Visit (INDEPENDENT_AMBULATORY_CARE_PROVIDER_SITE_OTHER): Payer: Self-pay | Admitting: *Deleted

## 2012-05-07 VITALS — BP 122/85 | HR 76 | Temp 97.7°F | Wt 258.5 lb

## 2012-05-07 DIAGNOSIS — B20 Human immunodeficiency virus [HIV] disease: Secondary | ICD-10-CM

## 2012-05-07 LAB — COMPREHENSIVE METABOLIC PANEL
Albumin: 4.1 g/dL (ref 3.5–5.2)
Alkaline Phosphatase: 63 U/L (ref 39–117)
BUN: 15 mg/dL (ref 6–23)
Calcium: 9.4 mg/dL (ref 8.4–10.5)
Chloride: 104 mEq/L (ref 96–112)
Glucose, Bld: 92 mg/dL (ref 70–99)
Potassium: 3.8 mEq/L (ref 3.5–5.3)

## 2012-05-07 LAB — LIPID PANEL
HDL: 31 mg/dL — ABNORMAL LOW (ref >39)
Triglycerides: 179 mg/dL — ABNORMAL HIGH (ref <150)

## 2012-05-07 NOTE — Progress Notes (Signed)
Pt here for A5001 Final Study Visit, week 208. Pt had appt with Dr. Daiva Eves and ARV's were switched to one a day Atripla. Pt tolerating and has not missed a dose since starting. Has vivid dreams but are tolerable. No rashes. Pt is taking ARV with No food. Fasting labs obtained; vital signs are stable. Pt is eligible and likely to enroll in A5322/HAILO study. Discussed the study will not be covering HIV VL and CD4 but will cover maintenance labs. Pt ok with this. Will contact him when study opens. Last VL 182, taken 2 months after starting Atripla where he has been <40 in the past. Have rechecked his HIV VL as part of final study visit and hoepefully will be undetectable; Will follow-up. Pt received $20.00 gift card for study visit. Tacey Heap RN

## 2012-05-13 ENCOUNTER — Other Ambulatory Visit: Payer: Self-pay | Admitting: *Deleted

## 2012-05-13 DIAGNOSIS — E785 Hyperlipidemia, unspecified: Secondary | ICD-10-CM

## 2012-05-13 MED ORDER — PRAVASTATIN SODIUM 20 MG PO TABS: 20.0000 mg | ORAL_TABLET | Freq: Every day | ORAL | Status: DC

## 2012-05-14 ENCOUNTER — Ambulatory Visit (INDEPENDENT_AMBULATORY_CARE_PROVIDER_SITE_OTHER): Payer: Self-pay | Admitting: Infectious Disease

## 2012-05-14 ENCOUNTER — Encounter: Payer: Self-pay | Admitting: Infectious Disease

## 2012-05-14 VITALS — BP 125/81 | HR 80 | Temp 97.9°F | Wt 260.0 lb

## 2012-05-14 DIAGNOSIS — B20 Human immunodeficiency virus [HIV] disease: Secondary | ICD-10-CM

## 2012-05-14 DIAGNOSIS — IMO0001 Reserved for inherently not codable concepts without codable children: Secondary | ICD-10-CM

## 2012-05-14 DIAGNOSIS — E1165 Type 2 diabetes mellitus with hyperglycemia: Secondary | ICD-10-CM

## 2012-05-14 NOTE — Assessment & Plan Note (Signed)
Need to get him a primary care physician and to get him plugged in with a diabetic nurse or educator

## 2012-05-14 NOTE — Assessment & Plan Note (Signed)
" >>  ASSESSMENT AND PLAN FOR HUMAN IMMUNODEFICIENCY VIRUS (HIV) DISEASE (HCC) WRITTEN ON 05/14/2012 11:18 AM BY VAN DAM, [place]iah Trawick N, MD  VIral load was up a bit overly this is just a bleb rather than reflecting the emergency l resistance not diagnosed and genotype testing. Will see with a viral load and the ACT G. shows. "

## 2012-05-14 NOTE — Progress Notes (Signed)
  Subjective:    Patient ID: Wayne Wolfe, male    DOB: [birth year], 46 y.o.   MRN: [id number]  HPI  46 year old man previously perfectly suppressed on Prezista Norvir and Truvada whom I changed over to Atripla. Most recent viral load in late July showed his viral load to have come up above all 100. He states he may have missed one dose of Atripla but otherwise not missed any medicines. He did have a viral load drawn via the AIDS clinical trials group in approximately a week ago and this is still pending. He does have some vivid dreams with the Atripla but is not yet bothering to the point where he wished to stop it. I did suggest to older alternative single tablet regimen including stribild and complera. Otherwise he is doing well.  Review of Systems  Constitutional: Negative for fever, chills, diaphoresis, activity change, appetite change, fatigue and unexpected weight change.  HENT: Negative for congestion, sore throat, rhinorrhea, sneezing, trouble swallowing and sinus pressure.   Eyes: Negative for photophobia and visual disturbance.  Respiratory: Negative for cough, chest tightness, shortness of breath, wheezing and stridor.   Cardiovascular: Negative for chest pain, palpitations and leg swelling.  Gastrointestinal: Negative for nausea, vomiting, abdominal pain, diarrhea, constipation, blood in stool, abdominal distention and anal bleeding.  Genitourinary: Negative for dysuria, hematuria, flank pain and difficulty urinating.  Musculoskeletal: Negative for myalgias, back pain, joint swelling, arthralgias and gait problem.  Skin: Negative for color change, pallor, rash and wound.  Neurological: Negative for dizziness, tremors, weakness and light-headedness.  Hematological: Negative for adenopathy. Does not bruise/bleed easily.  Psychiatric/Behavioral: Negative for behavioral problems, confusion, disturbed wake/sleep cycle, dysphoric mood, decreased concentration and agitation.         Objective:   Physical Exam  Constitutional: He is oriented to person, place, and time. He appears well-developed and well-nourished. No distress.  HENT:  Head: Normocephalic and atraumatic.  Mouth/Throat: Oropharynx is clear and moist. No oropharyngeal exudate.  Eyes: Conjunctivae and EOM are normal. Pupils are equal, round, and reactive to light. No scleral icterus.  Neck: Normal range of motion. Neck supple. No JVD present.  Cardiovascular: Normal rate, regular rhythm and normal heart sounds.  Exam reveals no gallop and no friction rub.   No murmur heard. Pulmonary/Chest: Effort normal and breath sounds normal. No respiratory distress. He has no wheezes. He has no rales. He exhibits no tenderness.  Abdominal: He exhibits no distension and no mass. There is no tenderness. There is no rebound and no guarding.  Musculoskeletal: He exhibits no edema and no tenderness.  Lymphadenopathy:    He has no cervical adenopathy.  Neurological: He is alert and oriented to person, place, and time. He has normal reflexes. He exhibits normal muscle tone. Coordination normal.  Skin: Skin is warm and dry. He is not diaphoretic. No erythema. No pallor.  Psychiatric: He has a normal mood and affect. His behavior is normal. Judgment and thought content normal.          Assessment & Plan:  AIDS VIral load was up a bit overly this is just a bleb rather than reflecting the emergency l resistance not diagnosed and genotype testing. Will see with a viral load and the ACT G. shows.  Diabetes type 2, uncontrolled Need to get him a primary care physician and to get him plugged in with a diabetic nurse or educator

## 2012-05-14 NOTE — Assessment & Plan Note (Signed)
VIral load was up a bit overly this is just a bleb rather than reflecting the emergency l resistance not diagnosed and genotype testing. Will see with a viral load and the ACT G. shows.

## 2012-05-22 NOTE — Addendum Note (Signed)
Addended by: Phill Myron on: 05/22/2012 11:31 AM   Modules accepted: Orders

## 2012-06-09 ENCOUNTER — Other Ambulatory Visit: Payer: Self-pay | Admitting: Infectious Disease

## 2012-06-09 DIAGNOSIS — B20 Human immunodeficiency virus [HIV] disease: Secondary | ICD-10-CM

## 2012-06-11 ENCOUNTER — Other Ambulatory Visit: Payer: Self-pay

## 2012-06-11 DIAGNOSIS — B20 Human immunodeficiency virus [HIV] disease: Secondary | ICD-10-CM

## 2012-06-12 LAB — HIV-1 RNA ULTRAQUANT REFLEX TO GENTYP+: HIV-1 RNA Quant, Log: 1.59 {Log} — ABNORMAL HIGH (ref <1.30)

## 2012-06-25 ENCOUNTER — Encounter: Payer: Self-pay | Admitting: Infectious Disease

## 2012-06-25 ENCOUNTER — Ambulatory Visit (INDEPENDENT_AMBULATORY_CARE_PROVIDER_SITE_OTHER): Payer: Self-pay | Admitting: Infectious Disease

## 2012-06-25 VITALS — BP 118/82 | Temp 98.2°F | Wt 255.0 lb

## 2012-06-25 DIAGNOSIS — Z23 Encounter for immunization: Secondary | ICD-10-CM

## 2012-06-25 DIAGNOSIS — E1165 Type 2 diabetes mellitus with hyperglycemia: Secondary | ICD-10-CM

## 2012-06-25 DIAGNOSIS — B20 Human immunodeficiency virus [HIV] disease: Secondary | ICD-10-CM

## 2012-06-25 DIAGNOSIS — E785 Hyperlipidemia, unspecified: Secondary | ICD-10-CM

## 2012-06-25 DIAGNOSIS — IMO0002 Reserved for concepts with insufficient information to code with codable children: Secondary | ICD-10-CM

## 2012-06-25 DIAGNOSIS — IMO0001 Reserved for inherently not codable concepts without codable children: Secondary | ICD-10-CM

## 2012-06-25 DIAGNOSIS — I10 Essential (primary) hypertension: Secondary | ICD-10-CM

## 2012-06-25 DIAGNOSIS — L910 Hypertrophic scar: Secondary | ICD-10-CM | POA: Insufficient documentation

## 2012-06-25 LAB — HEMOGLOBIN A1C: Hgb A1c MFr Bld: 6.1 % — ABNORMAL HIGH (ref <5.7)

## 2012-06-25 NOTE — Assessment & Plan Note (Signed)
Fairly well controlled on thiazide.

## 2012-06-25 NOTE — Assessment & Plan Note (Signed)
Patient has area of the mid sternum looks consistent with a keloid versus benign lipoma. We'll observe.

## 2012-06-25 NOTE — Progress Notes (Signed)
  Subjective:    Patient ID: Wayne Wolfe, male    DOB: March 18, 1966, 46 y.o.   MRN: [id number]  HPI  46 year old man previously perfectly suppressed on Prezista Norvir and Truvada whom I changed over to Atripla with viral load at 39 copies. He is doing well with minimal vivid dreams. He does have an area on his chest which looks like a benign lipoma. He continues to the form for his diabetes he is does not have a primary care physician I have encouraged him to be seen by one of the internal medicine clinic since he lacks insurance. Also on seen by ophthalmologist.   Review of Systems  Constitutional: Negative for fever, chills, diaphoresis, activity change, appetite change, fatigue and unexpected weight change.  HENT: Negative for congestion, sore throat, rhinorrhea, sneezing, trouble swallowing and sinus pressure.   Eyes: Negative for photophobia and visual disturbance.  Respiratory: Negative for cough, chest tightness, shortness of breath, wheezing and stridor.   Cardiovascular: Negative for chest pain, palpitations and leg swelling.  Gastrointestinal: Negative for nausea, vomiting, abdominal pain, diarrhea, constipation, blood in stool, abdominal distention and anal bleeding.  Genitourinary: Negative for dysuria, hematuria, flank pain and difficulty urinating.  Musculoskeletal: Negative for myalgias, back pain, joint swelling, arthralgias and gait problem.  Skin: Negative for color change, pallor, rash and wound.  Neurological: Negative for dizziness, tremors, weakness and light-headedness.  Hematological: Negative for adenopathy. Does not bruise/bleed easily.  Psychiatric/Behavioral: Negative for behavioral problems, confusion, disturbed wake/sleep cycle, dysphoric mood, decreased concentration and agitation.       Objective:   Physical Exam  Constitutional: He is oriented to person, place, and time. He appears well-developed and well-nourished. No distress.  HENT:  Head:  Normocephalic and atraumatic.  Mouth/Throat: Oropharynx is clear and moist. No oropharyngeal exudate.  Eyes: Conjunctivae normal and EOM are normal. Pupils are equal, round, and reactive to light. No scleral icterus.  Neck: Normal range of motion. Neck supple. No JVD present.  Cardiovascular: Normal rate, regular rhythm and normal heart sounds.  Exam reveals no gallop and no friction rub.   No murmur heard. Pulmonary/Chest: Effort normal and breath sounds normal. No respiratory distress. He has no wheezes. He has no rales. He exhibits no tenderness.  Abdominal: He exhibits no distension and no mass. There is no tenderness. There is no rebound and no guarding.  Musculoskeletal: He exhibits no edema and no tenderness.  Lymphadenopathy:    He has no cervical adenopathy.  Neurological: He is alert and oriented to person, place, and time. He has normal reflexes. He exhibits normal muscle tone. Coordination normal.  Skin: Skin is warm and dry. He is not diaphoretic. No erythema. No pallor.  Psychiatric: He has a normal mood and affect. His behavior is normal. Judgment and thought content normal.          Assessment & Plan:  AIDS Essentially perfect control continue Atripla  Diabetes type 2, uncontrolled Check hemoglobin A1c continue metformin and Pravachol referred to internal medicine clinic  ESSENTIAL HYPERTENSION, BENIGN Fairly well controlled on thiazide.  Hyperlipidemia At goal on Pravachol.  Keloid of skin Patient has area of the mid sternum looks consistent with a keloid versus benign lipoma. We'll observe.

## 2012-06-25 NOTE — Addendum Note (Signed)
Addended by: Mariea Clonts D on: 06/25/2012 04:45 PM   Modules accepted: Orders

## 2012-06-25 NOTE — Assessment & Plan Note (Signed)
Essentially perfect control continue Atripla

## 2012-06-25 NOTE — Assessment & Plan Note (Signed)
" >>  ASSESSMENT AND PLAN FOR HUMAN IMMUNODEFICIENCY VIRUS (HIV) DISEASE (HCC) WRITTEN ON 06/25/2012 11:49 AM BY VAN DAM, Dodge Ator N, MD  Essentially perfect control continue Atripla "

## 2012-06-25 NOTE — Assessment & Plan Note (Signed)
At goal on Pravachol.

## 2012-06-25 NOTE — Assessment & Plan Note (Signed)
Check hemoglobin A1c continue metformin and Pravachol referred to internal medicine clinic

## 2012-08-13 ENCOUNTER — Telehealth: Payer: Self-pay | Admitting: Licensed Clinical Social Worker

## 2012-08-13 NOTE — Telephone Encounter (Signed)
Patient has an expired orange, and he does not qualify anymore because he has Medicare part A which only covers hospital services. He has been counseled to get part B so he can pay for other services of healthcare.

## 2012-10-22 ENCOUNTER — Ambulatory Visit: Payer: Medicare Other

## 2012-10-23 ENCOUNTER — Ambulatory Visit: Payer: Medicare Other

## 2012-11-20 ENCOUNTER — Other Ambulatory Visit: Payer: Medicare Other

## 2012-11-20 ENCOUNTER — Ambulatory Visit (INDEPENDENT_AMBULATORY_CARE_PROVIDER_SITE_OTHER): Payer: Self-pay | Admitting: *Deleted

## 2012-11-20 ENCOUNTER — Other Ambulatory Visit: Payer: Self-pay | Admitting: Infectious Disease

## 2012-11-20 VITALS — BP 121/84 | HR 71 | Temp 97.4°F | Resp 18 | Ht 67.0 in | Wt 241.8 lb

## 2012-11-20 DIAGNOSIS — B20 Human immunodeficiency virus [HIV] disease: Secondary | ICD-10-CM

## 2012-11-20 DIAGNOSIS — E785 Hyperlipidemia, unspecified: Secondary | ICD-10-CM

## 2012-11-20 DIAGNOSIS — E119 Type 2 diabetes mellitus without complications: Secondary | ICD-10-CM

## 2012-11-20 LAB — CBC WITH DIFFERENTIAL/PLATELET
Basophils Absolute: 0.1 10*3/uL (ref 0.0–0.1)
Basophils Relative: 3 % — ABNORMAL HIGH (ref 0–1)
Eosinophils Absolute: 0.2 10*3/uL (ref 0.0–0.7)
Hemoglobin: 16.7 g/dL (ref 13.0–17.0)
MCH: 29 pg (ref 26.0–34.0)
MCHC: 33.3 g/dL (ref 30.0–36.0)
Neutro Abs: 1.3 10*3/uL — ABNORMAL LOW (ref 1.7–7.7)
Neutrophils Relative %: 38 % — ABNORMAL LOW (ref 43–77)
Platelets: 288 10*3/uL (ref 150–400)
RDW: 15.1 % (ref 11.5–15.5)

## 2012-11-20 LAB — HEMOGLOBIN A1C
Hgb A1c MFr Bld: 6 % — ABNORMAL HIGH (ref <5.7)
Mean Plasma Glucose: 126 mg/dL — ABNORMAL HIGH (ref <117)

## 2012-11-20 LAB — COMPREHENSIVE METABOLIC PANEL
BUN: 10 mg/dL (ref 6–23)
CO2: 29 mEq/L (ref 19–32)
Calcium: 9.5 mg/dL (ref 8.4–10.5)
Chloride: 100 mEq/L (ref 96–112)
Creat: 1.05 mg/dL (ref 0.50–1.35)

## 2012-11-20 LAB — RPR

## 2012-11-20 LAB — LIPID PANEL
Cholesterol: 149 mg/dL (ref 0–200)
HDL: 36 mg/dL — ABNORMAL LOW (ref >39)
Total CHOL/HDL Ratio: 4.1 Ratio
Triglycerides: 116 mg/dL (ref <150)

## 2012-11-21 ENCOUNTER — Telehealth: Payer: Self-pay | Admitting: *Deleted

## 2012-11-21 LAB — CREATININE, URINE, RANDOM: Creatinine, Urine: 371.7 mg/dL

## 2012-11-21 LAB — PROTEIN, URINE, RANDOM: Total Protein, Urine: 12 mg/dL

## 2012-11-21 NOTE — Progress Notes (Signed)
Pt here for entry into A5322. Consent discussed with patient; answered any questions; he verbalized understanding and signed consent. Pt c/o intermittent loose stools (1-3 per week). He denies blood or pain with these BMs. He believes this is from his regimen change to [facility]. Have advised Dr. Daiva Eves and awaiting further instructions for this issue. Pt states it does not interfere with his daily living and is not an urgent matter. He has lost 13 lbs since his visit in SEP/2013. He has been walking to lose weight but was not intending to lose it as quickly. Pt has a good appetite, does not feel an increase in fatigue, and is eating regular meals through out the day. Will continue to monitor his weight.  Fasting labs were drawn having to stick him twice. Pt received $50.00 for study visit. Next appt scheduled for Thursday, 04/06/13 @ 10am. Tacey Heap RN

## 2012-11-21 NOTE — Telephone Encounter (Signed)
I would have him come in earlier. I am surprised that the atripla is causing diarrhea. There are certainly other options besides atripla or Prezista, norvir and truvada. He has been on atripla for a half year and having these symptoms? It is probably NOT related to the meds.

## 2012-11-21 NOTE — Telephone Encounter (Signed)
Pt stated he has been having intermittent loose stools ever since he started on ATRIPLA (JUN/2013).  Loose stools 1-3 days/week where he was not having them as frequently before. He denies pain or blood with BMs. He thought this would go away once his body got use to the medicine.  He was requesting to go back to his prior regimen of Truvada, Darunavir, and Ritonavir or try a new regimen. I suggested he add more fiber to diet to see if that would help.  His next appt with you is 12/23/12. Have him come in earlier, wait, or medicine change? Please advise. Tacey Heap RN

## 2012-11-24 NOTE — Telephone Encounter (Signed)
Spoke with patient and made him an earlier appointment to see you.  Tacey Heap RN

## 2012-11-24 NOTE — Telephone Encounter (Signed)
Excellent

## 2012-12-10 ENCOUNTER — Ambulatory Visit (INDEPENDENT_AMBULATORY_CARE_PROVIDER_SITE_OTHER): Payer: Self-pay | Admitting: Infectious Disease

## 2012-12-10 ENCOUNTER — Encounter: Payer: Self-pay | Admitting: Infectious Disease

## 2012-12-10 VITALS — BP 119/80 | HR 78 | Temp 98.4°F | Ht 66.0 in | Wt 246.0 lb

## 2012-12-10 DIAGNOSIS — E119 Type 2 diabetes mellitus without complications: Secondary | ICD-10-CM

## 2012-12-10 DIAGNOSIS — B20 Human immunodeficiency virus [HIV] disease: Secondary | ICD-10-CM

## 2012-12-10 DIAGNOSIS — E785 Hyperlipidemia, unspecified: Secondary | ICD-10-CM

## 2012-12-10 NOTE — Progress Notes (Signed)
  Subjective:    Patient ID: Wayne Wolfe, male    DOB: 1966-03-07, 47 y.o.   MRN: [id number]  HPI   47 year old man previously perfectly suppressed on Prezista Norvir and Truvada whom I changed over to Atripla with VL<20. He has no specific complaints. He continues to have trouble getting CD4 much above 200 and has chronic leukopenia. We discussed idea of switching to INtegrase based regimen but I think we should hold off for now. I do want him to see Diabetic educator and have eye exam.  Review of Systems  Constitutional: Negative for fever, chills, diaphoresis, activity change, appetite change, fatigue and unexpected weight change.  HENT: Negative for congestion, sore throat, rhinorrhea, sneezing, trouble swallowing and sinus pressure.   Eyes: Negative for photophobia and visual disturbance.  Respiratory: Negative for cough, chest tightness, shortness of breath, wheezing and stridor.   Cardiovascular: Negative for chest pain, palpitations and leg swelling.  Gastrointestinal: Negative for nausea, vomiting, abdominal pain, diarrhea, constipation, blood in stool, abdominal distention and anal bleeding.  Genitourinary: Negative for dysuria, hematuria, flank pain and difficulty urinating.  Musculoskeletal: Negative for myalgias, back pain, joint swelling, arthralgias and gait problem.  Skin: Negative for color change, pallor, rash and wound.  Neurological: Negative for dizziness, tremors, weakness and light-headedness.  Hematological: Negative for adenopathy. Does not bruise/bleed easily.  Psychiatric/Behavioral: Negative for behavioral problems, confusion, sleep disturbance, dysphoric mood, decreased concentration and agitation.       Objective:   Physical Exam  Constitutional: He is oriented to person, place, and time. He appears well-developed and well-nourished. No distress.  HENT:  Head: Normocephalic and atraumatic.  Mouth/Throat: Oropharynx is clear and moist. No oropharyngeal  exudate.  Eyes: Conjunctivae and EOM are normal. Pupils are equal, round, and reactive to light. No scleral icterus.  Neck: Normal range of motion. Neck supple. No JVD present.  Cardiovascular: Normal rate, regular rhythm and normal heart sounds.  Exam reveals no gallop and no friction rub.   No murmur heard. Pulmonary/Chest: Effort normal and breath sounds normal. No respiratory distress. He has no wheezes. He has no rales. He exhibits no tenderness.  Abdominal: He exhibits no distension and no mass. There is no tenderness. There is no rebound and no guarding.  Musculoskeletal: He exhibits no edema and no tenderness.  Lymphadenopathy:    He has no cervical adenopathy.  Neurological: He is alert and oriented to person, place, and time. He has normal reflexes. He exhibits normal muscle tone. Coordination normal.  Skin: Skin is warm and dry. He is not diaphoretic. No erythema. No pallor.  Psychiatric: He has a normal mood and affect. His behavior is normal. Judgment and thought content normal.          Assessment & Plan:   HIV: continue atripla for now. Will need to double check inclusion criteria but he might be candidate for STRIIVING switch study (which would offer free INtegrase inhibitor for 48 weeks vs 2nd 24 weeks plus labs  DM: continue metformin. Should be seen by L[facility] and optho  Hyperlipidemia: at goal

## 2012-12-19 ENCOUNTER — Encounter: Payer: Self-pay | Admitting: Internal Medicine

## 2012-12-19 ENCOUNTER — Ambulatory Visit (INDEPENDENT_AMBULATORY_CARE_PROVIDER_SITE_OTHER): Payer: Self-pay | Admitting: Internal Medicine

## 2012-12-19 ENCOUNTER — Encounter: Payer: Self-pay | Admitting: Dietician

## 2012-12-19 ENCOUNTER — Ambulatory Visit (INDEPENDENT_AMBULATORY_CARE_PROVIDER_SITE_OTHER): Payer: Self-pay | Admitting: Dietician

## 2012-12-19 VITALS — Ht 67.0 in

## 2012-12-19 VITALS — BP 123/82 | HR 87 | Temp 97.4°F | Ht 66.0 in | Wt 244.3 lb

## 2012-12-19 DIAGNOSIS — E119 Type 2 diabetes mellitus without complications: Secondary | ICD-10-CM

## 2012-12-19 DIAGNOSIS — IMO0001 Reserved for inherently not codable concepts without codable children: Secondary | ICD-10-CM

## 2012-12-19 DIAGNOSIS — E785 Hyperlipidemia, unspecified: Secondary | ICD-10-CM

## 2012-12-19 DIAGNOSIS — E1165 Type 2 diabetes mellitus with hyperglycemia: Secondary | ICD-10-CM

## 2012-12-19 DIAGNOSIS — I10 Essential (primary) hypertension: Secondary | ICD-10-CM

## 2012-12-19 MED ORDER — HYDROCHLOROTHIAZIDE 25 MG PO TABS: 25.0000 mg | ORAL_TABLET | Freq: Every day | ORAL | Status: DC

## 2012-12-19 MED ORDER — METFORMIN HCL 500 MG PO TABS: 500.0000 mg | ORAL_TABLET | Freq: Two times a day (BID) | ORAL | Status: DC

## 2012-12-19 NOTE — Progress Notes (Signed)
Diabetes Self-Management Education  Visit Number: First/Initial  12/19/2012 [name], identified by name and date of birth, is a 47 y.o. male with Diabetes Type: Type 2.  Other people present during visit:    no  ASSESSMENT  Patient Concerns:   eye exam, annual diabetes visit  Lab Results: LDL Cholesterol  Date Value Range Status  11/20/2012 90  0 - 99 mg/dL Final   Hemoglobin N5A  Date Value Range Status  11/20/2012 6.0* <5.7 % Final        08/13/2011 5.7   Final     Family History  Problem Relation Age of Onset  . Diabetes Mellitus II Sister   . Diabetes Mellitus II Mother   . Coronary artery disease Mother 33    CABGx6  . Coronary artery disease Sister 83    CABG   History  Substance Use Topics  . Smoking status: Never Smoker   . Smokeless tobacco: Never Used  . Alcohol Use: No     Comment: Last alcohol 10 years ago, former moderate use, never binge.     Support Systems:  Parents Special Needs:  None  Prior DM Education:   Daily Foot Exams: Yes Patient Belief / Attitude about Diabetes:  Diabetes can be controlled  Assessment comments: patient somewhat knowledgable about his diabetes.      Individualized Plan for Diabetes Self-Management Training:  Patient individualized diabetes plan discussed today with patient and includes: what is Diabetes, Nutrition, medications, monitoring, physical activity, how to handle highs, Special care for my body when I have diabetes,  Education Topics Reviewed with Patient Today:  Topic Points Discussed  Disease State Definition of diabetes, type 1 and 2, and the diagnosis of diabetes  Nutrition Management    Physical Activity and Exercise Role of exercise on diabetes management, blood pressure control and cardiac health.  Medications Reviewed patients medication for diabetes, action, purpose, timing of dose and side effects.  Monitoring Purpose and frequency of SMBG.  Acute Complications    Chronic Complications     Psychosocial Adjustment    Goal Setting    Preconception Care (if applicable)      PATIENTS GOALS   Learning Objective(s):   (assess diabetes educational needs)  Goal The patient agrees to:  Nutrition    Physical Activity    Medications    Monitoring    Problem Solving    Reducing Risk Other (comment)  Health Coping     Patient Self-Evaluation of Goals (Subsequent Visits)  Goal The patient rates self as meeting goals (% of time)  Nutrition    Physical Activity    Medications    Monitoring    Problem Solving    Reducing Risk    Health Coping       PERSONALIZED PLAN / SUPPORT  Self-Management Support:  Doctor's office;Family;CDE visits ______________________________________________________________________  Outcomes  Expected Outcomes:  Demonstrated interest in learning. Expect positive outcomes Self-Care Barriers:  Lack of material resources Education material provided: yes If problems or questions, patient to contact team via:  Phone Time in: 1100     Time out: 1130 Future DSME appointment: -  6-12 months   Wayne Wolfe, Wayne Wolfe 12/19/2012 1:54 PM

## 2012-12-19 NOTE — Progress Notes (Deleted)
Patient ID: Wayne Wolfe, male   DOB: 12-Sep-1966, 47 y.o.   MRN: [id number]  Subjective:   Patient ID: Wayne Wolfe male   DOB: 06-14-1966 47 y.o.   MRN: [id number]  HPI: Wayne Wolfe is a 47 y.o. male with history of *** presenting to the clinic ***.  Wayne Wolfe is a 47 y.o. male with history of T2DM, HIV/AIDS, HLD, and HTN presenting to the clinic to establish primary care.  In terms of his T2DM, his disease is controlled on metformin 500mg  BID. Last HbA1c was 6.0 one month ago. He reports that he check his blood sugars at home. He denies any symptoms of neuropathy or visual changes. Has not had a retinal exam.  In terms of his HTN, his blood pressure is well controlled on HCTZ 25mg  daily.  For his HIV/AIDS, he follows w Dr. Daiva Eves [facility]. He reports compliance with Atripla therapy. Last CD4 count one month ago was 210 with viral load of <20.  For his hyperlipidemia, he takes pravastatin 20mg       Past Medical History  Diagnosis Date  . Type 2 diabetes mellitus     Well controlled on metformin  . Hypertension 2009    Well controlled on HCTZ  . HIV (human immunodeficiency virus infection) 2009    11/20/2012 Last CD4 count 210 and VL <20   Current Outpatient Prescriptions  Medication Sig Dispense Refill  . efavirenz-emtrictabine-tenofovir (ATRIPLA) 600-200-300 MG per tablet Take 1 tablet by mouth at bedtime.  30 tablet  11  . hydrochlorothiazide (HYDRODIURIL) 25 MG tablet Take 1 tablet (25 mg total) by mouth daily.  30 tablet  11  . metFORMIN (GLUCOPHAGE) 500 MG tablet Take 1 tablet (500 mg total) by mouth 2 (two) times daily with a meal.  120 tablet  4  . pravastatin (PRAVACHOL) 20 MG tablet Take 1 tablet (20 mg total) by mouth daily.  30 tablet  11   No current facility-administered medications for this visit.   Family History  Problem Relation Age of Onset  . Diabetes Mellitus II Sister   . Diabetes Mellitus II Mother   . Coronary artery disease Mother 47   CABGx6  . Coronary artery disease Sister 39    CABG   History   Social History  . Marital Status: Single    Spouse Name: N/A    Number of Children: N/A  . Years of Education: N/A   Social History Main Topics  . Smoking status: Never Smoker   . Smokeless tobacco: Never Used  . Alcohol Use: No     Comment: Last alcohol 10 years ago, former moderate use, never binge.   . Drug Use: No  . Sexually Active: Yes     Comment: pt. declined condoms   Other Topics Concern  . None   Social History Narrative   On disability. Lives in [place].    Review of Systems: 10 pt ROS performed, pertinent positives and negatives noted in HPI Objective:  Physical Exam: Filed Vitals:   12/19/12 0929  BP: 123/82  Pulse: 87  Temp: 97.4 F (36.3 C)  TempSrc: Oral  Height: 5\' 6"  (1.676 m)  Weight: 244 lb 4.8 oz (110.814 kg)  SpO2: 97%   Constitutional: Vital signs reviewed.  Patient is a well-developed and well-nourished *** in no acute distress and cooperative with exam. Alert and oriented x3.  Head: Normocephalic and atraumatic Ear: TM normal bilaterally Mouth: no erythema or exudates, MMM Eyes: PERRL,  EOMI, conjunctivae normal, No scleral icterus.  Neck: Supple, Trachea midline normal ROM; no JVD, mass, thyromegaly.  Cardiovascular: RRR, S1 normal, S2 normal, no MRG, pulses symmetric and intact bilaterally Pulmonary/Chest: CTAB, no wheezes, rales, or rhonchi Abdominal: Soft. Non-tender, non-distended, bowel sounds are normal, no masses, organomegaly, or guarding present.  GU: no CVA tenderness Musculoskeletal: No joint deformities, erythema, or stiffness, ROM full and no nontender Hematology: no cervical adenopathy or visible bruising Neurological: A&O x3, Strength is normal and symmetric bilaterally, cranial nerve II-XII are grossly intact, no focal motor deficit, sensory intact to light touch bilaterally.  Skin: Warm, dry and intact. No rash, cyanosis, or clubbing.  Psychiatric:  Normal mood and affect. Assessment & Plan:   Please see problem-based charting for assessment and plan.

## 2012-12-19 NOTE — Assessment & Plan Note (Signed)
BP Readings from Last 3 Encounters:  12/19/12 123/82  12/10/12 119/80  11/21/12 121/84    Lab Results  Component Value Date   NA 139 11/20/2012   K 3.8 11/20/2012   CREATININE 1.05 11/20/2012    Assessment:  Blood pressure control: controlled  Progress toward BP goal:  at goal    Plan:  Medications:  continue current medications  Educational resources provided: brochure  Self management tools provided: home blood pressure logbook

## 2012-12-19 NOTE — Patient Instructions (Addendum)
Please follow up in 6 months to a year.  We have 3 more diabetes issues that can be addressed:   1- things to do to take care of myself when I have diabetes  2- how to handle sick days and high blood sugars.  3- meal planning in particular- at some point in the future we may want to discuss "what are carbohydrates"  I will call you about the eye exam.   Please feel free to call anytime with diabetes questions.   [name] [phone]

## 2012-12-19 NOTE — Progress Notes (Signed)
Patient goal is to complete eye exam, consider weekly profile to understand blood sugar patterns

## 2012-12-19 NOTE — Patient Instructions (Signed)
1. Keep taking all of your medicines. You are doing great! 2. I will call and let you know if anything is abnormal with the eye scan.  3. We will see you back in 6 months.

## 2012-12-19 NOTE — Progress Notes (Signed)
Patient ID: Wayne Wolfe, male   DOB: 1966/07/18, 47 y.o.   MRN: [id number]  Subjective:   Patient ID: Wayne Wolfe male   DOB: [birth year] 47 y.o.   MRN: [id number]  HPI: WayneKeanen A Wolfe is a 47 y.o. male with history of T2DM, HIV/AIDS, HLD, and HTN presenting to the clinic to establish primary care. In terms of his T2DM, his disease is controlled on metformin 500mg  BID. Last HbA1c was 6.0 one month ago. He reports that he does check his blood sugars at home and they range from 100 to 120s. Occasionally has blurry vision. Has not had a retinal exam. Denies s/s of hypoglycemia. In terms of his HTN, his blood pressure is well controlled on HCTZ 25mg  daily.  For his HIV/AIDS, he follows w Dr. [facility] E[facility]. He reports compliance with Atripla therapy. Last CD4 count one month ago was 210 with viral load of <20.   For his hyperlipidemia, he takes pravastatin 20mg . Is not actively exercising or eating healthfully.  Denies chest pain, SOB, cough, dizziness, N/V/D, abdominal pain, joint pain, numbness/tingling.   Past Medical History  Diagnosis Date  . Type 2 diabetes mellitus     Well controlled on metformin  . Hypertension 2009    Well controlled on HCTZ  . HIV (human immunodeficiency virus infection) 2009    11/20/2012 Last CD4 count 210 and VL <20   Current Outpatient Prescriptions  Medication Sig Dispense Refill  . efavirenz-emtrictabine-tenofovir (ATRIPLA) 600-200-300 MG per tablet Take 1 tablet by mouth at bedtime.  30 tablet  11  . hydrochlorothiazide (HYDRODIURIL) 25 MG tablet Take 1 tablet (25 mg total) by mouth daily.  30 tablet  11  . metFORMIN (GLUCOPHAGE) 500 MG tablet Take 1 tablet (500 mg total) by mouth 2 (two) times daily with a meal.  120 tablet  4  . pravastatin (PRAVACHOL) 20 MG tablet Take 1 tablet (20 mg total) by mouth daily.  30 tablet  11   No current facility-administered medications for this visit.   Family History  Problem Relation Age of Onset  . Diabetes  Mellitus II Sister   . Diabetes Mellitus II Mother   . Coronary artery disease Mother 79    CABGx6  . Coronary artery disease Sister 87    CABG   History   Social History  . Marital Status: Single    Spouse Name: N/A    Number of Children: N/A  . Years of Education: N/A   Social History Main Topics  . Smoking status: Never Smoker   . Smokeless tobacco: Never Used  . Alcohol Use: No     Comment: Last alcohol 10 years ago, former moderate use, never binge.   . Drug Use: No  . Sexually Active: Yes     Comment: pt. declined condoms   Other Topics Concern  . None   Social History Narrative   On disability. Lives in [place].    Review of Systems: 10 pt ROS performed, pertinent positives and negatives noted in HPI Objective:  Physical Exam: Filed Vitals:   12/19/12 0929  BP: 123/82  Pulse: 87  Temp: 97.4 F (36.3 C)  TempSrc: Oral  Height: 5\' 6"  (1.676 m)  Weight: 244 lb 4.8 oz (110.814 kg)  SpO2: 97%   Constitutional: Vital signs reviewed.  Patient is an overweight male in no acute distress and cooperative with exam. Alert and oriented x3.  Head: Normocephalic and atraumatic Ear: TM normal bilaterally Mouth: no erythema  or exudates, MMM Eyes: PERRL, EOMI, conjunctivae normal, No scleral icterus.  Neck: Supple, Trachea midline normal ROM; no JVD, mass, thyromegaly.  Cardiovascular: RRR, S1 normal, S2 normal, no MRG, pulses symmetric and intact bilaterally Pulmonary/Chest: CTAB, no wheezes, rales, or rhonchi Abdominal: Soft. Non-tender, non-distended, bowel sounds are normal, no masses, organomegaly, or guarding present.  Musculoskeletal: No joint deformities, erythema, or stiffness, ROM full and no nontender Hematology: no cervical adenopathy or visible bruising Neurological: A&O x3, Strength is normal and symmetric bilaterally, cranial nerve II-XII are grossly intact, no focal motor deficit, sensory intact to light touch bilaterally.  Skin: Has 1cmx1cm smooth  flesh colored papule over central chest. No discoloration, fluctuance/erythema. Warm, dry and intact. No rash, cyanosis, or clubbing.  Psychiatric: Normal mood and affect. Assessment & Plan:   Please see problem-based charting for assessment and plan.

## 2012-12-19 NOTE — Assessment & Plan Note (Addendum)
Lab Results  Component Value Date   HGBA1C 6.0* 11/20/2012   HGBA1C 6.1* 06/25/2012   HGBA1C 6.8* 02/22/2012     Assessment:  Diabetes control: good control (HgbA1C at goal)  Progress toward A1C goal:  at goal  Foot exam today: normal  Retinal scanner: to be performed today  Plan:  Medications:  continue current medications Metformin 500mg  BID  Home glucose monitoring:   Frequency: once a day   Timing:    Educational resources provided: brochure  Self management tools provided: home glucose logbook

## 2012-12-23 ENCOUNTER — Ambulatory Visit: Payer: Medicare Other | Admitting: Infectious Disease

## 2012-12-23 LAB — HM DIABETES EYE EXAM

## 2013-01-05 NOTE — Addendum Note (Signed)
Addended by: Bufford Spikes on: 01/05/2013 12:04 PM   Modules accepted: Orders

## 2013-02-03 ENCOUNTER — Encounter: Payer: Self-pay | Admitting: Infectious Disease

## 2013-02-03 ENCOUNTER — Other Ambulatory Visit (HOSPITAL_COMMUNITY)
Admission: RE | Admit: 2013-02-03 | Discharge: 2013-02-03 | Disposition: A | Discharge status: Home / Self Care | Payer: Self-pay | Source: Ambulatory Visit | Attending: Infectious Disease | Admitting: Infectious Disease

## 2013-02-03 ENCOUNTER — Ambulatory Visit (INDEPENDENT_AMBULATORY_CARE_PROVIDER_SITE_OTHER): Payer: Self-pay | Admitting: Infectious Disease

## 2013-02-03 VITALS — BP 130/83 | HR 91 | Temp 98.4°F | Ht 67.5 in | Wt 246.0 lb

## 2013-02-03 DIAGNOSIS — L910 Hypertrophic scar: Secondary | ICD-10-CM

## 2013-02-03 DIAGNOSIS — B20 Human immunodeficiency virus [HIV] disease: Secondary | ICD-10-CM

## 2013-02-03 DIAGNOSIS — L0292 Furuncle, unspecified: Secondary | ICD-10-CM | POA: Insufficient documentation

## 2013-02-03 DIAGNOSIS — E1169 Type 2 diabetes mellitus with other specified complication: Secondary | ICD-10-CM

## 2013-02-03 DIAGNOSIS — L02828 Furuncle of other sites: Secondary | ICD-10-CM | POA: Insufficient documentation

## 2013-02-03 DIAGNOSIS — E119 Type 2 diabetes mellitus without complications: Secondary | ICD-10-CM

## 2013-02-03 DIAGNOSIS — Z23 Encounter for immunization: Secondary | ICD-10-CM

## 2013-02-03 DIAGNOSIS — E669 Obesity, unspecified: Secondary | ICD-10-CM

## 2013-02-03 MED ORDER — DOXYCYCLINE HYCLATE 100 MG PO TABS: 100.0000 mg | ORAL_TABLET | Freq: Two times a day (BID) | ORAL | Status: DC

## 2013-02-03 NOTE — Progress Notes (Signed)
Applied pressure to surgical site for 5 minutes.  Bleeding stopped.  Applied pressure dressing to site.  Pt tolerated procedure.

## 2013-02-03 NOTE — Progress Notes (Signed)
Subjective:    Patient ID: Wayne Wolfe, male    DOB: 1965-11-07, 47 y.o.   MRN: [id number]  HPI   47 year old man previously perfectly suppressed on Prezista Norvir and Truvada whom I changed over to Atripla with VL<20. He comes to clinic today with complaint about area over his sternum in soft tissue that had appearance c/w keloid in the past that had grown in size the past 2 weeks and now was per his story draining purulent material.  He has no fevers, nausea or malaise  We decided to proceed with I and D and punch biopsy of the lesion (we had considered Dermatology referral but the pt did not have the funds to go to Derm at this time)  I spent greater than 45 minutes with the patient including greater than 50% of time in face to face counsel of the patient and in coordination of their care.        Review of Systems  Constitutional: Negative for fever, chills, diaphoresis, activity change, appetite change, fatigue and unexpected weight change.  HENT: Negative for congestion, sore throat, rhinorrhea, sneezing, trouble swallowing and sinus pressure.   Eyes: Negative for photophobia and visual disturbance.  Respiratory: Negative for cough, chest tightness, shortness of breath, wheezing and stridor.   Cardiovascular: Negative for chest pain, palpitations and leg swelling.  Gastrointestinal: Negative for nausea, vomiting, abdominal pain, diarrhea, constipation, blood in stool, abdominal distention and anal bleeding.  Genitourinary: Negative for dysuria, hematuria, flank pain and difficulty urinating.  Musculoskeletal: Negative for myalgias, back pain, joint swelling, arthralgias and gait problem.  Skin: Negative for color change, pallor, rash and wound.  Neurological: Negative for dizziness, tremors, weakness and light-headedness.  Hematological: Negative for adenopathy. Does not bruise/bleed easily.  Psychiatric/Behavioral: Negative for behavioral problems, confusion, sleep  disturbance, dysphoric mood, decreased concentration and agitation.       Objective:   Physical Exam  Constitutional: He is oriented to person, place, and time. He appears well-developed and well-nourished. No distress.  HENT:  Head: Normocephalic and atraumatic.  Mouth/Throat: Oropharynx is clear and moist. No oropharyngeal exudate.  Eyes: Conjunctivae and EOM are normal. Pupils are equal, round, and reactive to light. No scleral icterus.  Neck: Normal range of motion. Neck supple. No JVD present.  Cardiovascular: Normal rate, regular rhythm and normal heart sounds.  Exam reveals no gallop and no friction rub.   No murmur heard. Pulmonary/Chest: Effort normal and breath sounds normal. No respiratory distress. He has no wheezes. He has no rales. He exhibits no tenderness.    Abdominal: He exhibits no distension and no mass. There is no tenderness. There is no rebound and no guarding.  Musculoskeletal: He exhibits no edema and no tenderness.  Lymphadenopathy:    He has no cervical adenopathy.  Neurological: He is alert and oriented to person, place, and time. He has normal reflexes. He exhibits normal muscle tone. Coordination normal.  Skin: Skin is warm and dry. He is not diaphoretic. No erythema. No pallor.  Psychiatric: He has a normal mood and affect. His behavior is normal. Judgment and thought content normal.          Assessment & Plan:   HIV: continue atripla for now. Will need to double check inclusion criteria but he might be candidate for [facility]  DM: continue metformin. Seen by Lorelee New and optho  Hyperlipidemia: at goal  CHest wall lesion: I perfromed I and D (see procedure) with biopsy and placed the pt on  oral doxycycline.   Procedure: I and D and punch biopsy  Permit: Risks and benefits explained thoroughly and informed consent signed and int the chart  Description: I prepped the area in the usual sterile manner with betadine.   I sprayed the skin  with topical anesthetic to achieve anesthesis. IN INFIltrated the skin with copious amounts of 1% lidocaine. After anesthesia achieved i performed punchbiopsy of the left side of the lesion--> path  I proceeded to make a vertical incision and was able to express tissue out of the lesion along with blood. Culture taken and tissue sent for path

## 2013-02-05 ENCOUNTER — Telehealth: Payer: Self-pay | Admitting: Infectious Disease

## 2013-02-05 MED ORDER — LEVOFLOXACIN 750 MG PO TABS: 750.0000 mg | ORAL_TABLET | Freq: Every day | ORAL | Status: DC

## 2013-02-05 NOTE — Telephone Encounter (Signed)
Tania Ade can you make sure [name] picks up script for Levaquin in place of his doxycline he is growing a GNR in his tissue culture

## 2013-02-06 LAB — TISSUE CULTURE: Gram Stain: NONE SEEN

## 2013-02-10 ENCOUNTER — Encounter: Payer: Self-pay | Admitting: Dietician

## 2013-02-10 NOTE — Telephone Encounter (Signed)
He started taking Levaquin on Monday 02/09/13. He stated he feels the nodule has gone down. He will see you on Thursday. Tacey Heap RN

## 2013-02-12 ENCOUNTER — Encounter: Payer: Self-pay | Admitting: Infectious Disease

## 2013-02-12 ENCOUNTER — Ambulatory Visit (INDEPENDENT_AMBULATORY_CARE_PROVIDER_SITE_OTHER): Payer: Self-pay | Admitting: Infectious Disease

## 2013-02-12 VITALS — BP 129/83 | HR 90 | Temp 98.4°F | Ht 67.0 in | Wt 249.0 lb

## 2013-02-12 DIAGNOSIS — L910 Hypertrophic scar: Secondary | ICD-10-CM

## 2013-02-12 DIAGNOSIS — B20 Human immunodeficiency virus [HIV] disease: Secondary | ICD-10-CM

## 2013-02-12 NOTE — Progress Notes (Signed)
  Subjective:    Patient ID: Wayne Wolfe, male    DOB: 06-12-1966, 47 y.o.   MRN: [id number]  HPI  47 year old man previously perfectly suppressed on Prezista Norvir and Truvada whom I changed over to Atripla with VL<20. He came to clinic last week with complaint about area over his sternum in soft tissue that had appearance c/w keloid in the past that had grown in size the past 2 weeks and now was per his story draining purulent material.  I did I and D and punch biopsy. Punch bx ONLY showed keratin material with inflammation. CX grew E coli an dwe changed him from Doxy to levaquin      Review of Systems  Constitutional: Negative for fever, chills, diaphoresis, activity change, appetite change, fatigue and unexpected weight change.  HENT: Negative for congestion, sore throat, rhinorrhea, sneezing, trouble swallowing and sinus pressure.   Eyes: Negative for photophobia and visual disturbance.  Respiratory: Negative for cough, chest tightness, shortness of breath, wheezing and stridor.   Cardiovascular: Negative for chest pain, palpitations and leg swelling.  Gastrointestinal: Negative for nausea, vomiting, abdominal pain, diarrhea, constipation, blood in stool, abdominal distention and anal bleeding.  Genitourinary: Negative for dysuria, hematuria, flank pain and difficulty urinating.  Musculoskeletal: Negative for myalgias, back pain, joint swelling, arthralgias and gait problem.  Skin: Negative for color change, pallor, rash and wound.  Neurological: Negative for dizziness, tremors, weakness and light-headedness.  Hematological: Negative for adenopathy. Does not bruise/bleed easily.  Psychiatric/Behavioral: Negative for behavioral problems, confusion, sleep disturbance, dysphoric mood, decreased concentration and agitation.       Objective:   Physical Exam  Constitutional: He is oriented to person, place, and time. He appears well-developed and well-nourished. No distress.  HENT:   Head: Normocephalic and atraumatic.  Mouth/Throat: Oropharynx is clear and moist. No oropharyngeal exudate.  Eyes: Conjunctivae and EOM are normal. Pupils are equal, round, and reactive to light. No scleral icterus.  Neck: Normal range of motion. Neck supple. No JVD present.  Cardiovascular: Normal rate, regular rhythm and normal heart sounds.  Exam reveals no gallop and no friction rub.   No murmur heard. Pulmonary/Chest: Effort normal and breath sounds normal. No respiratory distress. He has no wheezes. He has no rales. He exhibits no tenderness.    Abdominal: He exhibits no distension and no mass. There is no tenderness. There is no rebound and no guarding.  Musculoskeletal: He exhibits no edema and no tenderness.  Lymphadenopathy:    He has no cervical adenopathy.  Neurological: He is alert and oriented to person, place, and time. He has normal reflexes. He exhibits normal muscle tone. Coordination normal.  Skin: Skin is warm and dry. He is not diaphoretic. No erythema. No pallor.  Psychiatric: He has a normal mood and affect. His behavior is normal. Judgment and thought content normal.          Assessment & Plan:   HIV: continue atripla for now. I think he might be candidate for [facility]ING  DM: continue metformin. Seen by Lorelee New and optho  Hyperlipidemia: at goal  CHest wall lesion: I perfromed I and D (and is better   I do think he should see Derm when able. It has been here for years though.

## 2013-02-27 ENCOUNTER — Other Ambulatory Visit: Payer: Self-pay | Admitting: *Deleted

## 2013-02-27 DIAGNOSIS — B20 Human immunodeficiency virus [HIV] disease: Secondary | ICD-10-CM

## 2013-02-27 MED ORDER — EFAVIRENZ-EMTRICITAB-TENOFOVIR 600-200-300 MG PO TABS: 1.0000 | ORAL_TABLET | Freq: Every day | ORAL | Status: DC

## 2013-04-09 ENCOUNTER — Other Ambulatory Visit: Payer: Self-pay

## 2013-04-16 ENCOUNTER — Ambulatory Visit (INDEPENDENT_AMBULATORY_CARE_PROVIDER_SITE_OTHER): Payer: Self-pay | Admitting: *Deleted

## 2013-04-16 ENCOUNTER — Other Ambulatory Visit (INDEPENDENT_AMBULATORY_CARE_PROVIDER_SITE_OTHER): Payer: Self-pay

## 2013-04-16 VITALS — BP 122/87 | HR 81 | Temp 97.8°F | Resp 17 | Ht 67.0 in | Wt 253.5 lb

## 2013-04-16 DIAGNOSIS — Z21 Asymptomatic human immunodeficiency virus [HIV] infection status: Secondary | ICD-10-CM

## 2013-04-16 DIAGNOSIS — B20 Human immunodeficiency virus [HIV] disease: Secondary | ICD-10-CM

## 2013-04-16 LAB — COMPLETE METABOLIC PANEL WITH GFR
ALT: 27 U/L (ref 0–53)
BUN: 9 mg/dL (ref 6–23)
CO2: 26 mEq/L (ref 19–32)
Calcium: 9.5 mg/dL (ref 8.4–10.5)
Chloride: 97 mEq/L (ref 96–112)
Creat: 1.06 mg/dL (ref 0.50–1.35)
GFR, Est African American: >89 mL/min
GFR, Est Non African American: 83 mL/min
Glucose, Bld: 107 mg/dL — ABNORMAL HIGH (ref 70–99)
Total Bilirubin: 0.4 mg/dL (ref 0.3–1.2)

## 2013-04-16 LAB — CBC WITH DIFFERENTIAL/PLATELET
Basophils Relative: 2 % — ABNORMAL HIGH (ref 0–1)
Eosinophils Absolute: 0.1 10*3/uL (ref 0.0–0.7)
Hemoglobin: 17.3 g/dL — ABNORMAL HIGH (ref 13.0–17.0)
Lymphs Abs: 1.9 10*3/uL (ref 0.7–4.0)
MCH: 30.1 pg (ref 26.0–34.0)
Neutro Abs: 1.8 10*3/uL (ref 1.7–7.7)
Neutrophils Relative %: 42 % — ABNORMAL LOW (ref 43–77)
Platelets: 300 10*3/uL (ref 150–400)
RBC: 5.75 MIL/uL (ref 4.22–5.81)

## 2013-04-17 LAB — HIV-1 RNA QUANT-NO REFLEX-BLD: HIV-1 RNA Quant, Log: <1.3 {Log} (ref <1.30)

## 2013-04-17 LAB — T-HELPER CELL (CD4) - (RCID CLINIC ONLY)
CD4 % Helper T Cell: 9 % — ABNORMAL LOW (ref 33–55)
CD4 T Cell Abs: 160 uL — ABNORMAL LOW (ref 400–2700)

## 2013-04-17 NOTE — Progress Notes (Signed)
Pt here for A5322, week 24. His nodule located on center of chest resolved in June 2014. He completed his course of Levaquin for this issue. Otherwise assessment is unchanged. His vitals are stable and is adherent to ARV regimen. Questionnaires completed and non-fasting labs drawn with no problems. He received $50 gift card for study. Next appt scheduled for Thursday, September 17, 2013 at 9:30am. Tacey Heap RN

## 2013-05-01 ENCOUNTER — Other Ambulatory Visit: Payer: Self-pay | Admitting: *Deleted

## 2013-05-01 DIAGNOSIS — E785 Hyperlipidemia, unspecified: Secondary | ICD-10-CM

## 2013-05-01 MED ORDER — PRAVASTATIN SODIUM 20 MG PO TABS: 20.0000 mg | ORAL_TABLET | Freq: Every day | ORAL | Status: DC

## 2013-06-02 ENCOUNTER — Other Ambulatory Visit: Payer: Self-pay

## 2013-06-02 ENCOUNTER — Ambulatory Visit: Payer: Self-pay

## 2013-06-02 DIAGNOSIS — Z21 Asymptomatic human immunodeficiency virus [HIV] infection status: Secondary | ICD-10-CM

## 2013-06-02 DIAGNOSIS — B20 Human immunodeficiency virus [HIV] disease: Secondary | ICD-10-CM

## 2013-06-03 LAB — T-HELPER CELL (CD4) - (RCID CLINIC ONLY): CD4 % Helper T Cell: 11 % — ABNORMAL LOW (ref 33–55)

## 2013-06-15 ENCOUNTER — Ambulatory Visit: Payer: Self-pay | Admitting: Infectious Disease

## 2013-06-15 ENCOUNTER — Ambulatory Visit (INDEPENDENT_AMBULATORY_CARE_PROVIDER_SITE_OTHER): Payer: Self-pay | Admitting: Infectious Disease

## 2013-06-15 ENCOUNTER — Encounter: Payer: Self-pay | Admitting: Infectious Disease

## 2013-06-15 VITALS — BP 117/83 | HR 77 | Temp 98.3°F | Wt 250.5 lb

## 2013-06-15 DIAGNOSIS — L910 Hypertrophic scar: Secondary | ICD-10-CM

## 2013-06-15 DIAGNOSIS — E119 Type 2 diabetes mellitus without complications: Secondary | ICD-10-CM

## 2013-06-15 DIAGNOSIS — E785 Hyperlipidemia, unspecified: Secondary | ICD-10-CM

## 2013-06-15 DIAGNOSIS — B20 Human immunodeficiency virus [HIV] disease: Secondary | ICD-10-CM

## 2013-06-15 DIAGNOSIS — Z23 Encounter for immunization: Secondary | ICD-10-CM

## 2013-06-15 MED ORDER — EMTRICITAB-RILPIVIR-TENOFOV DF 200-25-300 MG PO TABS: 1.0000 | ORAL_TABLET | Freq: Every day | ORAL | Status: DC

## 2013-06-15 NOTE — Progress Notes (Signed)
  Subjective:    Patient ID: Wayne Wolfe, male    DOB: Jan 29, 1966, 47 y.o.   MRN: [id number]  HPI   47 year old man previously perfectly suppressed on Prezista Norvir and Truvada whom I changed over to Atripla with VL<20.   He is interested in changing the regimen given persistence of intense dreams, diarrhea and his generally not liking the Atripla.      Review of Systems  Constitutional: Negative for fever, chills, diaphoresis, activity change, appetite change, fatigue and unexpected weight change.  HENT: Negative for congestion, sore throat, rhinorrhea, sneezing, trouble swallowing and sinus pressure.   Eyes: Negative for photophobia and visual disturbance.  Respiratory: Negative for cough, chest tightness, shortness of breath, wheezing and stridor.   Cardiovascular: Negative for chest pain, palpitations and leg swelling.  Gastrointestinal: Negative for nausea, vomiting, abdominal pain, diarrhea, constipation, blood in stool, abdominal distention and anal bleeding.  Genitourinary: Negative for dysuria, hematuria, flank pain and difficulty urinating.  Musculoskeletal: Negative for myalgias, back pain, joint swelling, arthralgias and gait problem.  Skin: Negative for color change, pallor, rash and wound.  Neurological: Negative for dizziness, tremors, weakness and light-headedness.  Hematological: Negative for adenopathy. Does not bruise/bleed easily.  Psychiatric/Behavioral: Negative for behavioral problems, confusion, sleep disturbance, dysphoric mood, decreased concentration and agitation.       Objective:   Physical Exam  Constitutional: He is oriented to person, place, and time. He appears well-developed and well-nourished. No distress.  HENT:  Head: Normocephalic and atraumatic.  Mouth/Throat: Oropharynx is clear and moist. No oropharyngeal exudate.  Eyes: Conjunctivae and EOM are normal. Pupils are equal, round, and reactive to light. No scleral icterus.  Neck: Normal  range of motion. Neck supple. No JVD present.  Cardiovascular: Normal rate, regular rhythm and normal heart sounds.  Exam reveals no gallop and no friction rub.   No murmur heard. Pulmonary/Chest: Effort normal and breath sounds normal. No respiratory distress. He has no wheezes. He has no rales. He exhibits no tenderness.    Abdominal: He exhibits no distension and no mass. There is no tenderness. There is no rebound and no guarding.  Musculoskeletal: He exhibits no edema and no tenderness.  Lymphadenopathy:    He has no cervical adenopathy.  Neurological: He is alert and oriented to person, place, and time. He has normal reflexes. He exhibits normal muscle tone. Coordination normal.  Skin: Skin is warm and dry. He is not diaphoretic. No erythema. No pallor.  Psychiatric: He has a normal mood and affect. His behavior is normal. Judgment and thought content normal.          Assessment & Plan:   HIV: change to Complera rtc in 1 month for labs  DM: continue metformin. Seen by Lorelee New and optho  Hyperlipidemia: at goal  CHest wall lesion:  Is better continue to observe without insurance, and with biopsy performed, no strong reason for derm referral

## 2013-07-06 ENCOUNTER — Other Ambulatory Visit (INDEPENDENT_AMBULATORY_CARE_PROVIDER_SITE_OTHER): Payer: Self-pay

## 2013-07-06 DIAGNOSIS — B20 Human immunodeficiency virus [HIV] disease: Secondary | ICD-10-CM

## 2013-07-06 DIAGNOSIS — Z21 Asymptomatic human immunodeficiency virus [HIV] infection status: Secondary | ICD-10-CM

## 2013-07-06 LAB — COMPREHENSIVE METABOLIC PANEL
ALT: 15 U/L (ref 0–53)
AST: 9 U/L (ref 0–37)
Calcium: 9.5 mg/dL (ref 8.4–10.5)
Chloride: 99 mEq/L (ref 96–112)
Creat: 1.17 mg/dL (ref 0.50–1.35)

## 2013-07-06 LAB — CBC WITH DIFFERENTIAL/PLATELET
Basophils Absolute: 0.1 10*3/uL (ref 0.0–0.1)
Eosinophils Relative: 5 % (ref 0–5)
Lymphocytes Relative: 54 % — ABNORMAL HIGH (ref 12–46)
Neutro Abs: 1.3 10*3/uL — ABNORMAL LOW (ref 1.7–7.7)
Neutrophils Relative %: 31 % — ABNORMAL LOW (ref 43–77)
Platelets: 282 10*3/uL (ref 150–400)
RDW: 14.2 % (ref 11.5–15.5)
WBC: 4.1 10*3/uL (ref 4.0–10.5)

## 2013-07-07 LAB — HIV-1 RNA QUANT-NO REFLEX-BLD
HIV 1 RNA Quant: <20 copies/mL (ref <20)
HIV-1 RNA Quant, Log: <1.3 {Log} (ref <1.30)

## 2013-07-20 ENCOUNTER — Ambulatory Visit (INDEPENDENT_AMBULATORY_CARE_PROVIDER_SITE_OTHER): Payer: Self-pay | Admitting: Infectious Disease

## 2013-07-20 ENCOUNTER — Encounter: Payer: Self-pay | Admitting: Infectious Disease

## 2013-07-20 VITALS — BP 110/79 | HR 67 | Temp 98.2°F | Wt 253.0 lb

## 2013-07-20 DIAGNOSIS — L989 Disorder of the skin and subcutaneous tissue, unspecified: Secondary | ICD-10-CM

## 2013-07-20 DIAGNOSIS — R05 Cough: Secondary | ICD-10-CM

## 2013-07-20 DIAGNOSIS — E669 Obesity, unspecified: Secondary | ICD-10-CM

## 2013-07-20 DIAGNOSIS — E119 Type 2 diabetes mellitus without complications: Secondary | ICD-10-CM

## 2013-07-20 DIAGNOSIS — R059 Cough, unspecified: Secondary | ICD-10-CM

## 2013-07-20 DIAGNOSIS — E1169 Type 2 diabetes mellitus with other specified complication: Secondary | ICD-10-CM

## 2013-07-20 DIAGNOSIS — B20 Human immunodeficiency virus [HIV] disease: Secondary | ICD-10-CM

## 2013-07-20 NOTE — Progress Notes (Signed)
  Subjective:    Patient ID: Wayne Wolfe, male    DOB: [birth year], 47 y.o.   MRN: [id number]  HPI   47 year old man previously perfectly suppressed on Prezista Norvir and Truvada whom I changed over to Atripla with VL<20. He then was  interested in changing the regimen given persistence of intense dreams, diarrhea and his generally not liking the Atripla, then changed to complera with perfect suppresion on this regimen. He is taking it correctly with meals and avoiding antacids.   He does have c/o of nonproductive to occ productive cough x 2 weeks without fever, chills, LE edema or orthopnea.      Review of Systems  Constitutional: Negative for fever, chills, diaphoresis, activity change, appetite change, fatigue and unexpected weight change.  HENT: Negative for congestion, rhinorrhea, sinus pressure, sneezing, sore throat and trouble swallowing.   Eyes: Negative for photophobia and visual disturbance.  Respiratory: Negative for cough, chest tightness, shortness of breath, wheezing and stridor.   Cardiovascular: Negative for chest pain, palpitations and leg swelling.  Gastrointestinal: Negative for nausea, vomiting, abdominal pain, diarrhea, constipation, blood in stool, abdominal distention and anal bleeding.  Genitourinary: Negative for dysuria, hematuria, flank pain and difficulty urinating.  Musculoskeletal: Negative for arthralgias, back pain, gait problem, joint swelling and myalgias.  Skin: Negative for color change, pallor, rash and wound.  Neurological: Negative for dizziness, tremors, weakness and light-headedness.  Hematological: Negative for adenopathy. Does not bruise/bleed easily.  Psychiatric/Behavioral: Negative for behavioral problems, confusion, sleep disturbance, dysphoric mood, decreased concentration and agitation.       Objective:   Physical Exam  Constitutional: He is oriented to person, place, and time. He appears well-developed and well-nourished. No  distress.  HENT:  Head: Normocephalic and atraumatic.  Mouth/Throat: Oropharynx is clear and moist. No oropharyngeal exudate.  Eyes: Conjunctivae and EOM are normal. Pupils are equal, round, and reactive to light. No scleral icterus.  Neck: Normal range of motion. Neck supple. No JVD present.  Cardiovascular: Normal rate, regular rhythm and normal heart sounds.  Exam reveals no gallop and no friction rub.   No murmur heard. Pulmonary/Chest: Effort normal and breath sounds normal. No respiratory distress. He has no wheezes. He has no rales. He exhibits no tenderness.    Abdominal: He exhibits no distension and no mass. There is no tenderness. There is no rebound and no guarding.  Musculoskeletal: He exhibits no edema and no tenderness.  Lymphadenopathy:    He has no cervical adenopathy.  Neurological: He is alert and oriented to person, place, and time. He has normal reflexes. He exhibits normal muscle tone. Coordination normal.  Skin: Skin is warm and dry. He is not diaphoretic. No erythema. No pallor.  Psychiatric: He has a normal mood and affect. His behavior is normal. Judgment and thought content normal.          Assessment & Plan:   HIV: continue  Complera rtc in  3 months  DM: continue metformin. Seen by Lorelee New and optho  Hyperlipidemia: at goal  CHest wall lesion: stable no need for further intervention at this time  Cough: sounds like possible viral URI. Will hold off on CXR 2d echo

## 2013-08-13 ENCOUNTER — Encounter: Payer: Self-pay | Admitting: Internal Medicine

## 2013-08-26 ENCOUNTER — Ambulatory Visit (INDEPENDENT_AMBULATORY_CARE_PROVIDER_SITE_OTHER): Payer: Self-pay | Admitting: Internal Medicine

## 2013-08-26 ENCOUNTER — Encounter: Payer: Self-pay | Admitting: Internal Medicine

## 2013-08-26 VITALS — BP 109/78 | HR 73 | Temp 97.3°F | Ht 66.0 in | Wt 258.7 lb

## 2013-08-26 DIAGNOSIS — R200 Anesthesia of skin: Secondary | ICD-10-CM

## 2013-08-26 DIAGNOSIS — M25529 Pain in unspecified elbow: Secondary | ICD-10-CM

## 2013-08-26 DIAGNOSIS — E119 Type 2 diabetes mellitus without complications: Secondary | ICD-10-CM

## 2013-08-26 DIAGNOSIS — M25521 Pain in right elbow: Secondary | ICD-10-CM

## 2013-08-26 DIAGNOSIS — R209 Unspecified disturbances of skin sensation: Secondary | ICD-10-CM

## 2013-08-26 DIAGNOSIS — R202 Paresthesia of skin: Secondary | ICD-10-CM | POA: Insufficient documentation

## 2013-08-26 DIAGNOSIS — B20 Human immunodeficiency virus [HIV] disease: Secondary | ICD-10-CM

## 2013-08-26 DIAGNOSIS — I10 Essential (primary) hypertension: Secondary | ICD-10-CM

## 2013-08-26 LAB — POCT GLYCOSYLATED HEMOGLOBIN (HGB A1C): Hemoglobin A1C: 5.8

## 2013-08-26 LAB — GLUCOSE, CAPILLARY: Glucose-Capillary: 97 mg/dL (ref 70–99)

## 2013-08-26 NOTE — Assessment & Plan Note (Addendum)
Lab Results  Component Value Date   HGBA1C 5.8 08/26/2013   HGBA1C 6.0* 11/20/2012   HGBA1C 6.1* 06/25/2012     Assessment: Diabetes control: good control (HgbA1C at goal) Progress toward A1C goal:  at goal Comments:   Plan: Medications:  continue current medications Home glucose monitoring: Frequency: no home glucose monitoring Timing:   Instruction/counseling given: reminded to bring medications to each visit, discussed the need for weight loss and discussed diet Educational resources provided: brochure Self management tools provided:   Other plans: continue metformin 500mg  BID as this is well tolerated and patient's A1c is well controlled ; checked urine microalbumin/cr ratio today as well as A1c  Of note, patient did receive the pneumovax in the past despite the fact that our records indicate that it is overdue for patient.  ADDENDUM:  Urine Microalb/Cr ratio is 2.7. No intervention or change to therapy.

## 2013-08-26 NOTE — Assessment & Plan Note (Signed)
Patient reports having bilateral hand numbness for a couple minutes after he wakes up. This seems to resolve after a couple minutes of moving his hands. I suspect patient has been sleeping on his hands or compressing his hands in some way during sleep. It is possible that this represents a peripheral neuropathy secondary to one of his HIV medications, however I think this is unlikely given it is only happening in the morning and does not occur otherwise. This does not sound like a recurrence of his carpal tunnel has all fingers are affected, he has no pain or weakness in his hands in the symptoms only occur in the morning when he wakes up. I asked the patient being more aware of his sleeping position at night to note if he does notice he is sleeping on his hands are making fists and  In his sleep. Otherwise, no other intervention at this time. I asked that patient follow up in our clinic if his symptoms worsen or progress in any way including symptoms in his feet or development of pain in his hands.

## 2013-08-26 NOTE — Assessment & Plan Note (Signed)
BP Readings from Last 3 Encounters:  08/26/13 109/78  07/20/13 110/79  06/15/13 117/83    Lab Results  Component Value Date   NA 136 07/06/2013   K 4.4 07/06/2013   CREATININE 1.17 07/06/2013    Assessment: Blood pressure control: controlled Progress toward BP goal:  at goal Comments:    Plan: Medications:  continue current medications Educational resources provided:   Self management tools provided:   Other plans: no changes to regimen today; continue HCTZ 25mg  daily

## 2013-08-26 NOTE — Assessment & Plan Note (Signed)
" >>  ASSESSMENT AND PLAN FOR HUMAN IMMUNODEFICIENCY VIRUS (HIV) DISEASE ([facility]CC) WRITTEN ON 08/26/2013 11:33 AM BY Shaye Lagace E, MD  Patient is followed closely by Dr. Lindia. He continues to be compliant with his complera. Last VL 14 and CD4 count 290.  "

## 2013-08-26 NOTE — Progress Notes (Signed)
I saw and evaluated the patient.  I personally confirmed the key portions of the history and exam documented by Dr. Chikowski and I reviewed pertinent patient test results.  The assessment, diagnosis, and plan were formulated together and I agree with the documentation in the resident's note. 

## 2013-08-26 NOTE — Assessment & Plan Note (Signed)
Patient is followed closely by Dr. Algis Liming. He continues to be compliant with his complera. Last VL 14 and CD4 count 290.

## 2013-08-26 NOTE — Patient Instructions (Signed)
Thank you for your visit today.  Please continue doing your arm stretches and taking Tylenol as needed for your elbow pain.  I am unsure why your hands are numb when you wake up. It does not sound like a recurrence of your carpal tunnel. This may be 2/2 the way you position your hands during sleep. Please return to clinic if your symptoms worsen or do not go away.   Return to clinic in 6 months.

## 2013-08-26 NOTE — Progress Notes (Signed)
Patient ID: Wayne Wolfe, male   DOB: 1966-02-02, 47 y.o.   MRN: [id number] HPI The patient is a 47 y.o. male with a history of T2DM, HIV/AIDS, HLD, HTN presenting for routine clinic visit.  HIV-Patient is followed closely by Dr. Algis Liming and is tolerating his Complera well. He takes this medication as prescribed. Last CD4 count 290 w/ VL of 14 (07/06/13).   DM type 2, well controlled: A1c today 5.8%. Patient is compliant w/ metformin 500mg  BID. Only occasional diarrhea, which patient states is manageable.   HLD: Last lipid panel 11/2012 shows lipids are under good control. Patient is compliant with pravastatin 20mg  daily.   R elbow pain: Patient reports having intermittent, dull right elbow pain for the past couple of weeks. He denies previous injury, but states he may have been sleeping on this arm "wrong". Patient has been performing exercises to stretch his arm and taking Tylenol, both of which have been helping the pain. The pain is worse and sometimes becomes sharp with movement especially when he extends his elbow completely. Denies weakness of right arm, numbness or tingling of his right arm.  Bilateral hand numbness: Patient complains of waking up with bilateral full hand numbness each morning for the past couple weeks. Patient denies having a pins and needles feeling in his hands and reports having "stiffness" for a couple of minutes, denies any pain in the hands or wrist. He is able to move his hands and "work out" this feeling in a few minutes after waking up and after this point his hands feel normal for the rest of the day. This happens every morning. Patient does have history of carpal tunnel and bilateral wrists. He had a carpal tunnel release on his right wrist and his corticosteroid injection on the left. Patient denies that the current symptoms feel similar to his carpal tunnel. Vision is unsure if he sleeps on his hands or not. He does endorse the pain on his right arm at times. Some  residual weakness to his right hand from his prior carpal tunnel, however the weakness has not worsened or progressed in either hand.  ROS: General: no fevers, chills, changes in weight, changes in appetite Skin: no rash HEENT: no blurry vision, sore throat Pulm: no dyspnea, coughing, wheezing CV: no chest pain, palpitations, shortness of breath Abd: no abdominal pain, nausea/vomiting, diarrhea/constipation GU: no dysuria, hematuria Ext: see HPI Neuro: see HPI  Filed Vitals:   08/26/13 0957  BP: 109/78  Pulse: 73  Temp: 97.3 F (36.3 C)    PEX General: alert, cooperative, and in no apparent distress HEENT: pupils equal round and reactive to light, vision grossly intact, oropharynx clear and non-erythematous  Neck: supple Lungs: clear to ascultation bilaterally, normal work of respiration, no wheezes, rales, ronchi Heart: regular rate and rhythm, no murmurs, gallops, or rubs Abdomen: soft, non-tender, non-distended, normal bowel sounds Extremities: warm extremities bilaterally, no BLE edema; there is mild TTP over R distal triceps as well as lateral elbow joint line; normal ROM to R elbow, shoulder, and wrist; there is no skin discoloration or gross deformity at the elbow or upper arm; negative Phalen's sign and Tinel's signs; normal grip strength; no tenderness to hand joints; normal ROM to bilateral hands Neurologic: alert & oriented X3, cranial nerves II-XII grossly intact, strength grossly intact, sensation intact to light touch  Current Outpatient Prescriptions on File Prior to Visit  Medication Sig Dispense Refill  . Emtricitab-Rilpivir-Tenofovir 200-25-300 MG TABS Take 1 tablet by mouth  daily.  30 tablet  11  . hydrochlorothiazide (HYDRODIURIL) 25 MG tablet Take 1 tablet (25 mg total) by mouth daily.  30 tablet  11  . metFORMIN (GLUCOPHAGE) 500 MG tablet Take 1 tablet (500 mg total) by mouth 2 (two) times daily with a meal.  120 tablet  4  . pravastatin (PRAVACHOL) 20 MG  tablet Take 1 tablet (20 mg total) by mouth daily.  30 tablet  11   No current facility-administered medications on file prior to visit.    Assessment/Plan

## 2013-08-26 NOTE — Assessment & Plan Note (Signed)
Patient complains of having right elbow and right triceps pain for the past 2 weeks. He has been doing exercises and taking Tylenol that seemed to be improving this pain. He doesn't remember a specific injury, however he does think he slept on it wrong therefore I believe this may represent musculoskeletal strain secondary to compression during sleep. He has no neurological deficit in his R arm nor does he have signs of infection or inflammation of the R elbow joint such as erythema or effusion, therefore I do not favor infectious or inflammatory etiology such as bursitis, cellulitis, or joint infection. I asked patient to continue doing his stretching at home as well as taking Tylenol for the pain. I would usually prescribe an antiinflammatory medication for presumed musculoskeletal pain, however NSAIDs interact to increase risk of side effects of tenofovir so I will not prescribe any NSAIDs today. If he does not have improvement within a couple of weeks I asked that he return to clinic for followup.

## 2013-08-27 LAB — MICROALBUMIN / CREATININE URINE RATIO
Creatinine, Urine: 216.5 mg/dL
Microalb Creat Ratio: 2.7 mg/g (ref 0.0–30.0)
Microalb, Ur: 0.59 mg/dL (ref 0.00–1.89)

## 2013-09-17 ENCOUNTER — Ambulatory Visit (INDEPENDENT_AMBULATORY_CARE_PROVIDER_SITE_OTHER): Payer: Self-pay | Admitting: *Deleted

## 2013-09-17 VITALS — BP 132/82 | HR 75 | Temp 98.1°F | Resp 16 | Wt 256.5 lb

## 2013-09-17 DIAGNOSIS — Z21 Asymptomatic human immunodeficiency virus [HIV] infection status: Secondary | ICD-10-CM

## 2013-09-17 DIAGNOSIS — B20 Human immunodeficiency virus [HIV] disease: Secondary | ICD-10-CM

## 2013-09-17 LAB — CBC WITH DIFFERENTIAL/PLATELET
Basophils Absolute: 0.1 10*3/uL (ref 0.0–0.1)
HCT: 51.6 % (ref 39.0–52.0)
Hemoglobin: 17.4 g/dL — ABNORMAL HIGH (ref 13.0–17.0)
Lymphs Abs: 1.7 10*3/uL (ref 0.7–4.0)
MCHC: 33.7 g/dL (ref 30.0–36.0)
MCV: 86.3 fL (ref 78.0–100.0)
Monocytes Absolute: 0.4 10*3/uL (ref 0.1–1.0)
Monocytes Relative: 10 % (ref 3–12)
Neutro Abs: 1.2 10*3/uL — ABNORMAL LOW (ref 1.7–7.7)
Neutrophils Relative %: 36 % — ABNORMAL LOW (ref 43–77)
Platelets: 318 10*3/uL (ref 150–400)
RBC: 5.98 MIL/uL — ABNORMAL HIGH (ref 4.22–5.81)
RDW: 14.6 % (ref 11.5–15.5)

## 2013-09-17 LAB — COMPREHENSIVE METABOLIC PANEL
Albumin: 4.2 g/dL (ref 3.5–5.2)
BUN: 10 mg/dL (ref 6–23)
CO2: 28 mEq/L (ref 19–32)
Calcium: 9.2 mg/dL (ref 8.4–10.5)
Chloride: 102 mEq/L (ref 96–112)
Glucose, Bld: 94 mg/dL (ref 70–99)
Potassium: 4.2 mEq/L (ref 3.5–5.3)
Sodium: 140 mEq/L (ref 135–145)
Total Protein: 7.1 g/dL (ref 6.0–8.3)

## 2013-09-17 LAB — LIPID PANEL
Cholesterol: 127 mg/dL (ref 0–200)
LDL Cholesterol: 79 mg/dL (ref 0–99)
Triglycerides: 88 mg/dL (ref <150)

## 2013-09-17 NOTE — Progress Notes (Signed)
Patient here for week 48 A5322 Study visit. He reports continuing rt elbow and arm pain and numbness since ist of November, tylenol helps a little, but says it is continuous. He has also noticed bilat hand numbness, mainly in the mornings. While doing the neuro tests for the study, he stated that his memory is bad. He did perform worse on the exam than in the past and says he has noticed that he seems to forget things more often. He says he still feels fatigued, gets winded easily. He will return for the study in May.

## 2013-09-18 LAB — PROTEIN, URINE, RANDOM: Total Protein, Urine: 13 mg/dL

## 2013-10-06 ENCOUNTER — Ambulatory Visit: Payer: Self-pay

## 2013-10-07 ENCOUNTER — Other Ambulatory Visit: Payer: Self-pay | Admitting: *Deleted

## 2013-10-07 DIAGNOSIS — B20 Human immunodeficiency virus [HIV] disease: Secondary | ICD-10-CM

## 2013-10-07 MED ORDER — EMTRICITAB-RILPIVIR-TENOFOV DF 200-25-300 MG PO TABS: 1.0000 | ORAL_TABLET | Freq: Every day | ORAL | Status: DC

## 2013-11-23 ENCOUNTER — Other Ambulatory Visit: Payer: Self-pay

## 2013-11-24 ENCOUNTER — Other Ambulatory Visit: Payer: Self-pay

## 2013-12-07 ENCOUNTER — Encounter: Payer: Self-pay | Admitting: Infectious Disease

## 2013-12-07 ENCOUNTER — Ambulatory Visit (INDEPENDENT_AMBULATORY_CARE_PROVIDER_SITE_OTHER): Payer: Self-pay | Admitting: Infectious Disease

## 2013-12-07 VITALS — BP 126/87 | HR 79 | Temp 98.1°F | Wt 266.0 lb

## 2013-12-07 DIAGNOSIS — E785 Hyperlipidemia, unspecified: Secondary | ICD-10-CM

## 2013-12-07 DIAGNOSIS — Z113 Encounter for screening for infections with a predominantly sexual mode of transmission: Secondary | ICD-10-CM

## 2013-12-07 DIAGNOSIS — B20 Human immunodeficiency virus [HIV] disease: Secondary | ICD-10-CM

## 2013-12-07 DIAGNOSIS — E119 Type 2 diabetes mellitus without complications: Secondary | ICD-10-CM

## 2013-12-07 NOTE — Progress Notes (Signed)
  Subjective:    Patient ID: Wayne Wolfe, male    DOB: [birth year], 48 y.o.   MRN: [id number]  HPI   48 year old man previously perfectly suppressed on Prezista Norvir and Truvada whom I changed over to Atripla and then to Complera with VL<20.  He is otherwise doing quite well without much in the way of complaints. He did have a productive cough several months ago.    Review of Systems  Constitutional: Negative for fever, chills, diaphoresis, activity change, appetite change, fatigue and unexpected weight change.  HENT: Negative for congestion, rhinorrhea, sinus pressure, sneezing, sore throat and trouble swallowing.   Eyes: Negative for photophobia and visual disturbance.  Respiratory: Negative for cough, chest tightness, shortness of breath, wheezing and stridor.   Cardiovascular: Negative for chest pain, palpitations and leg swelling.  Gastrointestinal: Negative for nausea, vomiting, abdominal pain, diarrhea, constipation, blood in stool, abdominal distention and anal bleeding.  Genitourinary: Negative for dysuria, hematuria, flank pain and difficulty urinating.  Musculoskeletal: Negative for arthralgias, back pain, gait problem, joint swelling and myalgias.  Skin: Negative for color change, pallor, rash and wound.  Neurological: Negative for dizziness, tremors, weakness and light-headedness.  Hematological: Negative for adenopathy. Does not bruise/bleed easily.  Psychiatric/Behavioral: Negative for behavioral problems, confusion, sleep disturbance, dysphoric mood, decreased concentration and agitation.       Objective:   Physical Exam  Constitutional: He is oriented to person, place, and time. He appears well-developed and well-nourished. No distress.  HENT:  Head: Normocephalic and atraumatic.  Mouth/Throat: Oropharynx is clear and moist. No oropharyngeal exudate.  Eyes: Conjunctivae and EOM are normal. Pupils are equal, round, and reactive to light. No scleral icterus.    Neck: Normal range of motion. Neck supple. No JVD present.  Cardiovascular: Normal rate, regular rhythm and normal heart sounds.  Exam reveals no gallop and no friction rub.   No murmur heard. Pulmonary/Chest: Effort normal and breath sounds normal. No respiratory distress. He has no wheezes. He has no rales. He exhibits no tenderness.    Abdominal: He exhibits no distension and no mass. There is no tenderness. There is no rebound and no guarding.  Musculoskeletal: He exhibits no edema and no tenderness.  Lymphadenopathy:    He has no cervical adenopathy.  Neurological: He is alert and oriented to person, place, and time. He has normal reflexes. He exhibits normal muscle tone. Coordination normal.  Skin: Skin is warm and dry. He is not diaphoretic. No erythema. No pallor.  Psychiatric: He has a normal mood and affect. His behavior is normal. Judgment and thought content normal.          Assessment & Plan:   HIV: continue  Complera rtc in  6 months. I spent greater than 25 minutes with the patient including greater than 50% of time in face to face counsel of the patient and in coordination of their care.   DM: continue metformin. Seen by Yaakov Guthrie and optho (will refer)  Hyperlipidemia: at goal

## 2013-12-08 LAB — T-HELPER CELL (CD4) - (RCID CLINIC ONLY)
CD4 T CELL ABS: 310 /uL — AB (ref 400–2700)
CD4 T CELL HELPER: 16 % — AB (ref 33–55)

## 2013-12-08 LAB — URINE CYTOLOGY ANCILLARY ONLY
Chlamydia: NEGATIVE
Neisseria Gonorrhea: NEGATIVE

## 2013-12-08 LAB — RPR

## 2013-12-08 LAB — HIV-1 RNA QUANT-NO REFLEX-BLD

## 2013-12-08 LAB — HEPATITIS C ANTIBODY: HCV Ab: NEGATIVE

## 2014-01-28 ENCOUNTER — Other Ambulatory Visit: Payer: Self-pay | Admitting: *Deleted

## 2014-01-28 DIAGNOSIS — I10 Essential (primary) hypertension: Secondary | ICD-10-CM

## 2014-01-28 MED ORDER — HYDROCHLOROTHIAZIDE 25 MG PO TABS: 25.0000 mg | ORAL_TABLET | Freq: Every day | ORAL | Status: DC

## 2014-01-28 NOTE — Telephone Encounter (Signed)
Pt has an appt  May 18.

## 2014-02-15 ENCOUNTER — Encounter: Payer: Self-pay | Admitting: Dietician

## 2014-02-15 ENCOUNTER — Encounter: Payer: Self-pay | Admitting: Internal Medicine

## 2014-02-16 ENCOUNTER — Ambulatory Visit (INDEPENDENT_AMBULATORY_CARE_PROVIDER_SITE_OTHER): Payer: Self-pay | Admitting: *Deleted

## 2014-02-16 VITALS — BP 115/79 | HR 91 | Temp 99.0°F | Resp 16 | Ht 67.25 in | Wt 264.5 lb

## 2014-02-16 DIAGNOSIS — B20 Human immunodeficiency virus [HIV] disease: Secondary | ICD-10-CM

## 2014-02-16 DIAGNOSIS — Z21 Asymptomatic human immunodeficiency virus [HIV] infection status: Secondary | ICD-10-CM

## 2014-02-16 NOTE — Progress Notes (Signed)
Patient here for week 72 A5322 visit. He denies any new problems or concerns. He continues to have night sweats and fatigue and numbness in his hands and feet. He will return in November for the next study visit.

## 2014-04-07 ENCOUNTER — Ambulatory Visit: Payer: Self-pay | Admitting: Infectious Disease

## 2014-04-27 ENCOUNTER — Telehealth: Payer: Self-pay | Admitting: Internal Medicine

## 2014-04-27 NOTE — Telephone Encounter (Signed)
I called the patient to introduce myself as his PCP and asked him to schedule a follow-up appointment with me, and he agreed.

## 2014-04-28 ENCOUNTER — Encounter: Payer: Self-pay | Admitting: Infectious Disease

## 2014-04-28 ENCOUNTER — Ambulatory Visit: Payer: Self-pay

## 2014-04-28 ENCOUNTER — Ambulatory Visit (INDEPENDENT_AMBULATORY_CARE_PROVIDER_SITE_OTHER): Payer: Self-pay | Admitting: Infectious Disease

## 2014-04-28 VITALS — BP 104/70 | HR 86 | Temp 98.9°F | Wt 271.0 lb

## 2014-04-28 DIAGNOSIS — B20 Human immunodeficiency virus [HIV] disease: Secondary | ICD-10-CM

## 2014-04-28 DIAGNOSIS — E785 Hyperlipidemia, unspecified: Secondary | ICD-10-CM

## 2014-04-28 DIAGNOSIS — E1159 Type 2 diabetes mellitus with other circulatory complications: Secondary | ICD-10-CM

## 2014-04-28 NOTE — Progress Notes (Signed)
  Subjective:    Patient ID: Wayne Wolfe, male    DOB: 12-13-1965, 48 y.o.   MRN: [id number]  HPI   48 year old man previously perfectly suppressed on Prezista Norvir and Truvada whom I changed over to Atripla and then to Complera with VL<20.  Lab Results  Component Value Date   HIV1RNAQUANT <20 12/07/2013   Lab Results  Component Value Date   CD4TABS 310* 12/07/2013   CD4TABS 290* 07/06/2013   CD4TABS 280* 06/02/2013   He otherwise been well without complaints today.    Review of Systems  Constitutional: Negative for fever, chills, diaphoresis, activity change, appetite change, fatigue and unexpected weight change.  HENT: Negative for congestion, rhinorrhea, sinus pressure, sneezing, sore throat and trouble swallowing.   Eyes: Negative for photophobia and visual disturbance.  Respiratory: Negative for cough, chest tightness, shortness of breath, wheezing and stridor.   Cardiovascular: Negative for chest pain, palpitations and leg swelling.  Gastrointestinal: Negative for nausea, vomiting, abdominal pain, diarrhea, constipation, blood in stool, abdominal distention and anal bleeding.  Genitourinary: Negative for dysuria, hematuria, flank pain and difficulty urinating.  Musculoskeletal: Negative for arthralgias, back pain, gait problem, joint swelling and myalgias.  Skin: Negative for color change, pallor, rash and wound.  Neurological: Negative for dizziness, tremors, weakness and light-headedness.  Hematological: Negative for adenopathy. Does not bruise/bleed easily.  Psychiatric/Behavioral: Negative for behavioral problems, confusion, sleep disturbance, dysphoric mood, decreased concentration and agitation.       Objective:   Physical Exam  Constitutional: He is oriented to person, place, and time. He appears well-developed and well-nourished. No distress.  HENT:  Head: Normocephalic and atraumatic.  Mouth/Throat: Oropharynx is clear and moist. No oropharyngeal exudate.    Eyes: Conjunctivae and EOM are normal. Pupils are equal, round, and reactive to light. No scleral icterus.  Neck: Normal range of motion. Neck supple. No JVD present.  Cardiovascular: Normal rate, regular rhythm and normal heart sounds.  Exam reveals no gallop and no friction rub.   No murmur heard. Pulmonary/Chest: Effort normal and breath sounds normal. No respiratory distress. He has no wheezes. He has no rales. He exhibits no tenderness.    Abdominal: He exhibits no distension and no mass. There is no tenderness. There is no rebound and no guarding.  Musculoskeletal: He exhibits no edema and no tenderness.  Lymphadenopathy:    He has no cervical adenopathy.  Neurological: He is alert and oriented to person, place, and time. He has normal reflexes. He exhibits normal muscle tone. Coordination normal.  Skin: Skin is warm and dry. He is not diaphoretic. No erythema. No pallor.  Psychiatric: He has a normal mood and affect. His behavior is normal. Judgment and thought content normal.          Assessment & Plan:   HIV: continue  Complera rtc in  6 months.    DM: continue metformin. Seen by Yaakov Guthrie and optho (had eye exam and he is doing well other than visual acuity for which he now needs glasses  Hyperlipidemia: at goal

## 2014-05-07 ENCOUNTER — Other Ambulatory Visit: Payer: Self-pay | Admitting: Licensed Clinical Social Worker

## 2014-05-07 DIAGNOSIS — E785 Hyperlipidemia, unspecified: Secondary | ICD-10-CM

## 2014-05-07 MED ORDER — PRAVASTATIN SODIUM 20 MG PO TABS: 20.0000 mg | ORAL_TABLET | Freq: Every day | ORAL | Status: DC

## 2014-05-11 ENCOUNTER — Other Ambulatory Visit: Payer: Self-pay | Admitting: *Deleted

## 2014-05-11 DIAGNOSIS — B20 Human immunodeficiency virus [HIV] disease: Secondary | ICD-10-CM

## 2014-05-11 MED ORDER — EMTRICITAB-RILPIVIR-TENOFOV DF 200-25-300 MG PO TABS: 1.0000 | ORAL_TABLET | Freq: Every day | ORAL | Status: DC

## 2014-05-11 NOTE — Telephone Encounter (Signed)
ADAP Application 

## 2014-06-08 ENCOUNTER — Other Ambulatory Visit: Payer: Self-pay | Admitting: *Deleted

## 2014-06-08 DIAGNOSIS — B20 Human immunodeficiency virus [HIV] disease: Secondary | ICD-10-CM

## 2014-06-08 MED ORDER — EMTRICITAB-RILPIVIR-TENOFOV DF 200-25-300 MG PO TABS: 1.0000 | ORAL_TABLET | Freq: Every day | ORAL | Status: DC

## 2014-06-09 ENCOUNTER — Other Ambulatory Visit: Payer: Self-pay | Admitting: *Deleted

## 2014-06-10 MED ORDER — HYDROCHLOROTHIAZIDE 25 MG PO TABS
25.0000 mg | ORAL_TABLET | Freq: Every day | ORAL | Status: DC
Start: 2014-06-10 — End: 2014-10-17

## 2014-06-10 NOTE — Telephone Encounter (Signed)
He is scheduled to see me next week, so I will refill his BP medication.

## 2014-06-18 ENCOUNTER — Encounter: Payer: Self-pay | Admitting: Internal Medicine

## 2014-07-09 ENCOUNTER — Encounter: Payer: Self-pay | Admitting: *Deleted

## 2014-07-19 ENCOUNTER — Ambulatory Visit: Payer: Self-pay

## 2014-07-20 ENCOUNTER — Ambulatory Visit: Payer: Self-pay

## 2014-07-21 ENCOUNTER — Ambulatory Visit: Payer: Self-pay

## 2014-07-29 ENCOUNTER — Ambulatory Visit (INDEPENDENT_AMBULATORY_CARE_PROVIDER_SITE_OTHER): Payer: Self-pay | Admitting: *Deleted

## 2014-07-29 VITALS — BP 119/85 | HR 82 | Temp 98.1°F | Resp 16 | Ht 67.25 in | Wt 268.5 lb

## 2014-07-29 DIAGNOSIS — Z23 Encounter for immunization: Secondary | ICD-10-CM

## 2014-07-29 DIAGNOSIS — B20 Human immunodeficiency virus [HIV] disease: Secondary | ICD-10-CM

## 2014-07-29 DIAGNOSIS — Z006 Encounter for examination for normal comparison and control in clinical research program: Secondary | ICD-10-CM

## 2014-07-29 LAB — COMPREHENSIVE METABOLIC PANEL
ALT: 23 U/L (ref 0–53)
AST: 16 U/L (ref 0–37)
Albumin: 4.6 g/dL (ref 3.5–5.2)
Alkaline Phosphatase: 71 U/L (ref 39–117)
BUN: 10 mg/dL (ref 6–23)
CALCIUM: 9.8 mg/dL (ref 8.4–10.5)
CO2: 29 mEq/L (ref 19–32)
CREATININE: 1.22 mg/dL (ref 0.50–1.35)
Chloride: 99 mEq/L (ref 96–112)
GLUCOSE: 121 mg/dL — AB (ref 70–99)
Potassium: 4 mEq/L (ref 3.5–5.3)
Sodium: 137 mEq/L (ref 135–145)
Total Bilirubin: 0.8 mg/dL (ref 0.2–1.2)
Total Protein: 7.6 g/dL (ref 6.0–8.3)

## 2014-07-29 LAB — LIPID PANEL
CHOL/HDL RATIO: 5.1 ratio
Cholesterol: 144 mg/dL (ref 0–200)
HDL: 28 mg/dL — AB (ref >39)
LDL Cholesterol: 91 mg/dL (ref 0–99)
TRIGLYCERIDES: 126 mg/dL (ref <150)
VLDL: 25 mg/dL (ref 0–40)

## 2014-07-29 LAB — HEPATITIS C ANTIBODY: HCV Ab: NEGATIVE

## 2014-07-29 LAB — HIV-1 RNA QUANT-NO REFLEX-BLD

## 2014-07-29 LAB — HEMOGLOBIN A1C
HEMOGLOBIN A1C: 8.2 % — AB (ref <5.7)
Mean Plasma Glucose: 189 mg/dL — ABNORMAL HIGH (ref <117)

## 2014-07-29 NOTE — Progress Notes (Signed)
Wayne Wolfe is here for A5322, week 52. His only new symptom is one sore that comes and goes located on his inside bottom lip. It has been there for about one month. He denies any pain, tenderness, or pruritis associated with it. Possible wart..? Will see Dr. Tommy Medal on November 18th for an OV and will ask him about this. Fasting labs were drawn, questionnaires completed, and neuro study performed. He received flu vaccine after blood was drawn. He received $50 gift card for visit. Next appointment scheduled for Feb 24, 2015 @ [facility]

## 2014-07-30 LAB — CREATININE, URINE, RANDOM: CREATININE, URINE: 435.6 mg/dL

## 2014-07-30 LAB — PROTEIN, URINE, RANDOM: Total Protein, Urine: 35 mg/dL — ABNORMAL HIGH (ref 5–25)

## 2014-08-11 ENCOUNTER — Telehealth: Payer: Self-pay | Admitting: *Deleted

## 2014-08-11 MED ORDER — METFORMIN HCL 500 MG PO TABS: 1000.0000 mg | ORAL_TABLET | Freq: Two times a day (BID) | ORAL | Status: DC

## 2014-08-11 NOTE — Telephone Encounter (Signed)
Wayne Wolfe's A1C increased to 8.2 with the last research labs. Dr. Tommy Medal said to have him increase his metformin to 1000mg  BID.  I called Simran and told him to up the dose as directed . He has put on 12 lbs since last year when his A1C was 5.8.

## 2014-08-11 NOTE — Telephone Encounter (Signed)
Perfect

## 2014-08-16 ENCOUNTER — Encounter: Payer: Self-pay | Admitting: Infectious Disease

## 2014-08-16 LAB — CD4/CD8 (T-HELPER/T-SUPPRESSOR CELL)
CD4%: 15.3
CD4: 275
CD8 % Suppressor T Cell: 57.4
CD8: 1033

## 2014-08-18 ENCOUNTER — Encounter: Payer: Self-pay | Admitting: Infectious Disease

## 2014-08-18 ENCOUNTER — Ambulatory Visit (INDEPENDENT_AMBULATORY_CARE_PROVIDER_SITE_OTHER): Payer: Self-pay | Admitting: Infectious Disease

## 2014-08-18 VITALS — BP 121/84 | HR 81 | Temp 98.5°F | Wt 270.0 lb

## 2014-08-18 DIAGNOSIS — E785 Hyperlipidemia, unspecified: Secondary | ICD-10-CM

## 2014-08-18 DIAGNOSIS — B20 Human immunodeficiency virus [HIV] disease: Secondary | ICD-10-CM

## 2014-08-18 DIAGNOSIS — E1165 Type 2 diabetes mellitus with hyperglycemia: Secondary | ICD-10-CM

## 2014-08-18 DIAGNOSIS — IMO0002 Reserved for concepts with insufficient information to code with codable children: Secondary | ICD-10-CM

## 2014-08-18 NOTE — Progress Notes (Signed)
  Subjective:    Patient ID: Wayne Wolfe, male    DOB: 1966-07-29, 48 y.o.   MRN: [id number]  HPI   48 year old man previously perfectly suppressed on Prezista Norvir and Truvada whom I changed over to Atripla and then to Complera with VL<20.  He has been doing perfectly with suppression of virus and healthy CD4.  Lab Results  Component Value Date   HIV1RNAQUANT <20 12/07/2013   Lab Results  Component Value Date   CD4TABS 275 07/29/2014   CD4TABS 310* 12/07/2013   CD4TABS 290* 07/06/2013   His A1c is up to 8 on lower metformin, and now we have asked him to go back to 1g bid.   Review of Systems  Constitutional: Negative for fever, chills, diaphoresis, activity change, appetite change, fatigue and unexpected weight change.  HENT: Negative for congestion, rhinorrhea, sinus pressure, sneezing, sore throat and trouble swallowing.   Eyes: Negative for photophobia and visual disturbance.  Respiratory: Negative for cough, chest tightness, shortness of breath, wheezing and stridor.   Cardiovascular: Negative for chest pain, palpitations and leg swelling.  Gastrointestinal: Negative for nausea, vomiting, abdominal pain, diarrhea, constipation, blood in stool, abdominal distention and anal bleeding.  Genitourinary: Negative for dysuria, hematuria, flank pain and difficulty urinating.  Musculoskeletal: Negative for myalgias, back pain, joint swelling, arthralgias and gait problem.  Skin: Negative for color change, pallor, rash and wound.  Neurological: Negative for dizziness, tremors, weakness and light-headedness.  Hematological: Negative for adenopathy. Does not bruise/bleed easily.  Psychiatric/Behavioral: Negative for behavioral problems, confusion, sleep disturbance, dysphoric mood, decreased concentration and agitation.       Objective:   Physical Exam  Constitutional: He is oriented to person, place, and time. He appears well-developed and well-nourished. No distress.  HENT:   Head: Normocephalic and atraumatic.  Mouth/Throat: Oropharynx is clear and moist. No oropharyngeal exudate.  Eyes: Conjunctivae and EOM are normal. Pupils are equal, round, and reactive to light. No scleral icterus.  Neck: Normal range of motion. Neck supple. No JVD present.  Cardiovascular: Normal heart sounds.   Pulmonary/Chest: Effort normal and breath sounds normal. No respiratory distress. He has no wheezes. He has no rales. He exhibits no tenderness.  Abdominal: He exhibits no distension.  Musculoskeletal: He exhibits no edema or tenderness.  Lymphadenopathy:    He has no cervical adenopathy.  Neurological: He is alert and oriented to person, place, and time. He exhibits normal muscle tone. Coordination normal.  Skin: Skin is warm and dry. He is not diaphoretic. No erythema. No pallor.  Psychiatric: He has a normal mood and affect. His behavior is normal. Judgment and thought content normal.          Assessment & Plan:   HIV: continue  Complera rtc in  6 months.    DM: increase metformin. Needs to be followed in IM again  Hyperlipidemia: at goal

## 2014-10-17 ENCOUNTER — Other Ambulatory Visit: Payer: Self-pay | Admitting: Infectious Disease

## 2014-11-15 ENCOUNTER — Ambulatory Visit: Payer: Self-pay | Admitting: Infectious Diseases

## 2014-12-01 ENCOUNTER — Ambulatory Visit: Payer: Self-pay

## 2014-12-02 ENCOUNTER — Other Ambulatory Visit: Payer: Self-pay | Admitting: *Deleted

## 2014-12-02 DIAGNOSIS — B20 Human immunodeficiency virus [HIV] disease: Secondary | ICD-10-CM

## 2014-12-02 MED ORDER — EMTRICITAB-RILPIVIR-TENOFOV DF 200-25-300 MG PO TABS: 1.0000 | ORAL_TABLET | Freq: Every day | ORAL | Status: DC

## 2014-12-02 NOTE — Telephone Encounter (Signed)
ADAP Application 

## 2014-12-14 ENCOUNTER — Other Ambulatory Visit: Payer: Self-pay | Admitting: Licensed Clinical Social Worker

## 2014-12-14 DIAGNOSIS — B20 Human immunodeficiency virus [HIV] disease: Secondary | ICD-10-CM

## 2014-12-14 MED ORDER — EMTRICITAB-RILPIVIR-TENOFOV DF 200-25-300 MG PO TABS: 1.0000 | ORAL_TABLET | Freq: Every day | ORAL | Status: DC

## 2015-02-17 ENCOUNTER — Encounter (INDEPENDENT_AMBULATORY_CARE_PROVIDER_SITE_OTHER): Payer: Self-pay | Admitting: *Deleted

## 2015-02-17 VITALS — BP 116/80 | HR 72 | Temp 98.2°F | Resp 16 | Wt 266.0 lb

## 2015-02-17 DIAGNOSIS — Z006 Encounter for examination for normal comparison and control in clinical research program: Secondary | ICD-10-CM

## 2015-02-17 NOTE — Progress Notes (Signed)
Wayne Wolfe is here for A5322, week 120. He denies any new medications and side effects, symptoms. Fasting labs were obtained. Questionnaires completed. He received $50 gift card for study visit. Next appointment scheduled for Tuesday, August 23, 2015 @ 9am. Eliezer Champagne RN

## 2015-03-25 ENCOUNTER — Encounter: Payer: Self-pay | Admitting: Infectious Disease

## 2015-03-25 LAB — HIV-1 RNA QUANT-NO REFLEX-BLD

## 2015-05-01 ENCOUNTER — Encounter (HOSPITAL_COMMUNITY): Payer: Self-pay | Admitting: Emergency Medicine

## 2015-05-01 ENCOUNTER — Emergency Department (INDEPENDENT_AMBULATORY_CARE_PROVIDER_SITE_OTHER)
Admission: EM | Admit: 2015-05-01 | Discharge: 2015-05-01 | Disposition: A | Discharge status: Home / Self Care | Payer: Self-pay | Source: Home / Self Care | Attending: Family Medicine | Admitting: Family Medicine

## 2015-05-01 DIAGNOSIS — R002 Palpitations: Secondary | ICD-10-CM

## 2015-05-01 DIAGNOSIS — R519 Headache, unspecified: Secondary | ICD-10-CM

## 2015-05-01 DIAGNOSIS — H5713 Ocular pain, bilateral: Secondary | ICD-10-CM

## 2015-05-01 DIAGNOSIS — R51 Headache: Secondary | ICD-10-CM

## 2015-05-01 DIAGNOSIS — F419 Anxiety disorder, unspecified: Secondary | ICD-10-CM

## 2015-05-01 MED ORDER — ALPRAZOLAM 0.5 MG PO TABS: 0.5000 mg | ORAL_TABLET | Freq: Every evening | ORAL | Status: DC | PRN

## 2015-05-01 NOTE — ED Provider Notes (Addendum)
CSN: [id number]     Arrival date & time 05/01/15  1503 History   None    Chief Complaint  Patient presents with  . Headache    Patient is a 49 y.o. male presenting with headaches, eye pain, and palpitations. The history is provided by the patient. No language interpreter was used.  Headache Pain location:  Frontal Quality:  Dull Severity currently:  0/10 (initially he had a headache of 6/10 in severity) Onset quality:  Sudden Duration:  0 hours Progression:  Resolved Chronicity:  New Associated symptoms: eye pain   Associated symptoms: no blurred vision, no cough, no diarrhea, no dizziness, no nausea and no near-syncope   Risk factors comment:  He is worried his mom will be taken off ventilator today at 2:30 pm and his headache started at 2:35 pm, he had not gone to the hospital to see his mom, so he currently does not know her status. His headache has since resolved. Eye Pain This is a new problem. The current episode started 1 to 2 hours ago. The problem has been gradually improving. Associated symptoms include headaches. Pertinent negatives include no chest pain and no shortness of breath. Nothing aggravates the symptoms. Nothing relieves the symptoms. He has tried nothing for the symptoms.  Palpitations Palpitations quality:  Unable to specify Onset quality:  Sudden Duration:  30 minutes Timing:  Intermittent Progression:  Resolved Context: anxiety   Context: not dehydration and not exercise  Caffeine: 30.   Context comment:  Worried about his mom being taken off the ventilator today Relieved by:  Deep relaxation Worsened by:  Nothing Associated symptoms: no chest pain, no cough, no dizziness, no nausea, no near-syncope and no shortness of breath    Vision: B/L  Past Medical History  Diagnosis Date  . Type 2 diabetes mellitus     Well controlled on metformin  . Hypertension 2009    Well controlled on HCTZ  . HIV (human immunodeficiency virus infection) 2009    11/20/2012  Last CD4 count 210 and VL <20   Past Surgical History  Procedure Laterality Date  . Carpal tunnel release Right 1990s  . None     Family History  Problem Relation Age of Onset  . Diabetes Mellitus II Sister   . Diabetes Mellitus II Mother   . Coronary artery disease Mother 38    CABGx6  . Coronary artery disease Sister 68    CABG  . Diabetes Mellitus II Sister   . Coronary artery disease Sister   . Diabetes Mellitus II Sister    History  Substance Use Topics  . Smoking status: Former Research scientist (life sciences)  . Smokeless tobacco: Never Used  . Alcohol Use: No     Comment: Last alcohol 10 years ago, former moderate use, never binge.     Review of Systems  Unable to perform ROS Eyes: Positive for pain. Negative for blurred vision.  Respiratory: Negative for cough and shortness of breath.   Cardiovascular: Positive for palpitations. Negative for chest pain and near-syncope.  Gastrointestinal: Negative for nausea and diarrhea.  Neurological: Positive for headaches. Negative for dizziness.  Psychiatric/Behavioral: Positive for sleep disturbance. Negative for self-injury.  All other systems reviewed and are negative.   Allergies  Review of patient's allergies indicates no known allergies.  Home Medications   Prior to Admission medications   Medication Sig Start Date End Date Taking? Authorizing Provider  Emtricitab-Rilpivir-Tenofovir 200-25-300 MG TABS Take 1 tablet by mouth daily. 12/14/14   Lavell Islam  Tommy Medal, MD  hydrochlorothiazide (HYDRODIURIL) 25 MG tablet TAKE 1 TABLET BY MOUTH DAILY 10/18/14   Truman Hayward, MD  metFORMIN (GLUCOPHAGE) 500 MG tablet Take 2 tablets (1,000 mg total) by mouth 2 (two) times daily with a meal. 08/11/14   Truman Hayward, MD  pravastatin (PRAVACHOL) 20 MG tablet Take 1 tablet (20 mg total) by mouth daily. 05/07/14   Truman Hayward, MD   BP 130/89 mmHg  Pulse 94  Temp(Src) 98.2 F (36.8 C) (Oral)  Resp 16  SpO2 97% Physical Exam   Constitutional: He is oriented to person, place, and time. He appears well-developed. No distress.  Eyes: Conjunctivae, EOM and lids are normal. Pupils are equal, round, and reactive to light. Pupils are equal.  Visual acuity by finger counting was normal B/L  Cardiovascular: Normal rate, regular rhythm and normal heart sounds.   No murmur heard. Pulmonary/Chest: Effort normal and breath sounds normal. No respiratory distress. He has no wheezes.  Abdominal: Soft. Bowel sounds are normal. He exhibits no distension and no mass. There is no tenderness.  Musculoskeletal: Normal range of motion. He exhibits no edema.  Neurological: He is alert and oriented to person, place, and time. He has normal strength and normal reflexes. No cranial nerve deficit or sensory deficit. He displays a negative Romberg sign.  Psychiatric: He has a normal mood and affect. His behavior is normal. Judgment and thought content normal.  Nursing note and vitals reviewed.   ED Course  Procedures (including critical care time) EKG done: NSR @ 95 bpm,non-specific ST and T wave changes. Reassuring since no chest pain. Labs Review Labs Reviewed - No data to display  Imaging Review No results found.   MDM  No diagnosis found. Headache Eye pain Palpitation  Patient likely demonstrating anxiety and stress related symptoms. He has not been sleeping well in the last few days with his mom in the hospital, now he is anxious about taking her off the vent. Tylenol recommended prn headache and eye ache. No vision loss but if eye pain persist to see PCP soon. EKG normal, currently symptom not cardiac related. Anxiolytic prescribed. Return precaution discussed.  Kinnie Feil, MD 05/01/15 1551  Kinnie Feil, MD 05/01/15 1600

## 2015-05-01 NOTE — Discharge Instructions (Signed)
Generalized Anxiety  Generalized anxiety disorder (GAD) is a mental disorder. It interferes with life functions, including relationships, work, and school. GAD is different from normal anxiety, which everyone experiences at some point in their lives in response to specific life events and activities. Normal anxiety actually helps [place] prepare for and get through these life events and activities. Normal anxiety goes away after the event or activity is over.  GAD causes anxiety that is not necessarily related to specific events or activities. It also causes excess anxiety in proportion to specific events or activities. The anxiety associated with GAD is also difficult to control. GAD can vary from mild to severe. People with severe GAD can have intense waves of anxiety with physical symptoms (panic attacks).  SYMPTOMS The anxiety and worry associated with GAD are difficult to control. This anxiety and worry are related to many life events and activities and also occur more days than not for 6 months or longer. People with GAD also have three or more of the following symptoms (one or more in children):  Restlessness.   Fatigue.  Difficulty concentrating.   Irritability.  Muscle tension.  Difficulty sleeping or unsatisfying sleep. DIAGNOSIS GAD is diagnosed through an assessment by your health care provider. Your health care provider will ask you questions aboutyour mood,physical symptoms, and events in your life. Your health care provider may ask you about your medical history and use of alcohol or drugs, including prescription medicines. Your health care provider may also do a physical exam and blood tests. Certain medical conditions and the use of certain substances can cause symptoms similar to those associated with GAD. Your health care provider may refer you to a mental health specialist for further evaluation. TREATMENT The following therapies are usually used to treat GAD:   Medication.  Antidepressant medication usually is prescribed for long-term daily control. Antianxiety medicines may be added in severe cases, especially when panic attacks occur.   Talk therapy (psychotherapy). Certain types of talk therapy can be helpful in treating GAD by providing support, education, and guidance. A form of talk therapy called cognitive behavioral therapy can teach you healthy ways to think about and react to daily life events and activities.  Stress managementtechniques. These include yoga, meditation, and exercise and can be very helpful when they are practiced regularly. A mental health specialist can help determine which treatment is best for you. Some people see improvement with one therapy. However, other people require a combination of therapies. Document Released: 01/12/2013 Document Revised: 02/01/2014 Document Reviewed: 01/12/2013 [facility] Patient Information 2015 [place], [place]. This information is not intended to replace advice given to you by your health care provider. Make sure you discuss any questions you have with your health care provider.

## 2015-05-01 NOTE — ED Notes (Signed)
Patient reports 30 minutes ago, his family made the decision to turn off ventilator of his mother.  Patient reports head hurting, eyes hurting.  Denies dizziness, denies lightheadedness.  Reports heart rate went up, then back to normal.  Left wrist and hand pain at the time

## 2015-05-14 ENCOUNTER — Other Ambulatory Visit: Payer: Self-pay | Admitting: Infectious Disease

## 2015-05-17 ENCOUNTER — Ambulatory Visit: Payer: Self-pay

## 2015-05-19 ENCOUNTER — Ambulatory Visit: Payer: Self-pay

## 2015-05-19 DIAGNOSIS — F4321 Adjustment disorder with depressed mood: Secondary | ICD-10-CM

## 2015-05-19 NOTE — BH Specialist Note (Signed)
I met with Wayne Wolfe for the first time today and he talked about the death of his mother 3 weeks ago and how he has struggled with this.  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  He said he has 7 sisters and one brother, but it hasn't affected them quite like it did him, since he was with her several days a week.  He described a panic attack he had recently and said he went to an Urgent Care and was prescribed Xanax.  I provided psycho-education on breathing from the diaphragm and also how to calm himself down when he feels anxiety coming on.  He responded well to this.  I also explained that grief has no timetable.  He said he had driven his car to her house a couple of times and sat and cried, but a friend told him it wasn't a good idea.  I told him not to listen to anyone about how to grieve, but to do it on his own time.  Plan to meet again in one week. Curley Spice, LCSW

## 2015-05-25 ENCOUNTER — Ambulatory Visit: Payer: Self-pay

## 2015-05-25 DIAGNOSIS — F4321 Adjustment disorder with depressed mood: Secondary | ICD-10-CM

## 2015-05-25 NOTE — BH Specialist Note (Signed)
Wayne Wolfe continues to grieve over the recent loss of his mother, for whom he was the caregiver.  We completed a treatment plan with a goal of improving his coping skills re: grief.  He was tearful at times.  He talked some about his family and how some of them are very supportive.  He said one of his sisters is a "bully" but he doesn't let her push him around.  He said he has lots of support from friends and family.  Plan to meet again in one week. Curley Spice, LCSW

## 2015-06-01 ENCOUNTER — Ambulatory Visit: Payer: Self-pay

## 2015-06-01 DIAGNOSIS — F4321 Adjustment disorder with depressed mood: Secondary | ICD-10-CM

## 2015-06-01 NOTE — BH Specialist Note (Signed)
Wayne Wolfe reported that he feels like he is doing some better and stated that today marks one month since his mother died.  He was tearful at times as he talked about her and stated that he went to her grave this week with one of his sisters.  He also talked about a sister who he refers to as a "bully" and how she had tried to stir up some trouble a few months back and is still upset about it.  He said he took a thank you card to the staff at the hospital who helped his mother and that it felt good to do this.  I praised him for doing this.  Overall, he seems to be grieving in appropriate ways and is making some progress. Curley Spice, LCSW

## 2015-06-14 ENCOUNTER — Ambulatory Visit: Payer: Self-pay

## 2015-06-14 DIAGNOSIS — F4321 Adjustment disorder with depressed mood: Secondary | ICD-10-CM

## 2015-06-14 NOTE — BH Specialist Note (Signed)
Wayne Wolfe reported today that he feels that he is doing much better regarding his grief over his mother's death.  He still cries some days, he said, but overall he feels better.  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 He reports that he is doing some social things, such as going to a play with a friend this weekend and being involved with his church.  He said he is going to a church related retreat in [place] next month.  He shared some stories about his mother and laughed and cried some.  We both agreed that he is much better and doesn't need to continue at this point (his idea), but left it for him to call and schedule if he feels the need in the future.   Curley Spice, LCSW

## 2015-06-18 ENCOUNTER — Other Ambulatory Visit: Payer: Self-pay | Admitting: Infectious Disease

## 2015-06-20 ENCOUNTER — Other Ambulatory Visit: Payer: Self-pay | Admitting: *Deleted

## 2015-06-20 DIAGNOSIS — B20 Human immunodeficiency virus [HIV] disease: Secondary | ICD-10-CM

## 2015-06-20 MED ORDER — EMTRICITAB-RILPIVIR-TENOFOV DF 200-25-300 MG PO TABS: 1.0000 | ORAL_TABLET | Freq: Every day | ORAL | Status: DC

## 2015-06-27 ENCOUNTER — Other Ambulatory Visit: Payer: Self-pay | Admitting: Infectious Disease

## 2015-06-27 DIAGNOSIS — E785 Hyperlipidemia, unspecified: Secondary | ICD-10-CM

## 2015-07-28 ENCOUNTER — Encounter (INDEPENDENT_AMBULATORY_CARE_PROVIDER_SITE_OTHER): Payer: Self-pay | Admitting: *Deleted

## 2015-07-28 VITALS — BP 116/65 | HR 65 | Temp 98.2°F | Resp 16 | Wt 262.0 lb

## 2015-07-28 DIAGNOSIS — Z006 Encounter for examination for normal comparison and control in clinical research program: Secondary | ICD-10-CM

## 2015-07-28 DIAGNOSIS — Z23 Encounter for immunization: Secondary | ICD-10-CM

## 2015-07-28 LAB — CD4/CD8 (T-HELPER/T-SUPPRESSOR CELL)
CD4 T CELL HELPER: 17.1
CD4: 342
CD8 % Suppressor T Cell: 58.9
CD8: 1178

## 2015-07-28 LAB — LIPID PANEL
CHOLESTEROL: 136 mg/dL (ref 125–200)
HDL: 31 mg/dL — AB (ref >=40)
LDL CALC: 87 mg/dL (ref <130)
TRIGLYCERIDES: 91 mg/dL (ref <150)
Total CHOL/HDL Ratio: 4.4 Ratio (ref <=5.0)
VLDL: 18 mg/dL (ref <30)

## 2015-07-28 LAB — COMPREHENSIVE METABOLIC PANEL
ALK PHOS: 66 U/L (ref 40–115)
ALT: 19 U/L (ref 9–46)
AST: 14 U/L (ref 10–35)
Albumin: 4.2 g/dL (ref 3.6–5.1)
BILIRUBIN TOTAL: 0.6 mg/dL (ref 0.2–1.2)
BUN: 11 mg/dL (ref 7–25)
CO2: 31 mmol/L (ref 20–31)
CREATININE: 1.17 mg/dL (ref 0.70–1.33)
Calcium: 9.4 mg/dL (ref 8.6–10.3)
Chloride: 99 mmol/L (ref 98–110)
GLUCOSE: 133 mg/dL — AB (ref 65–99)
Potassium: 4.3 mmol/L (ref 3.5–5.3)
SODIUM: 140 mmol/L (ref 135–146)
Total Protein: 7.1 g/dL (ref 6.1–8.1)

## 2015-07-28 LAB — HEMOGLOBIN A1C
Hgb A1c MFr Bld: 7.6 % — ABNORMAL HIGH (ref <5.7)
Mean Plasma Glucose: 171 mg/dL — ABNORMAL HIGH (ref <117)

## 2015-07-28 LAB — HIV-1 RNA QUANT-NO REFLEX-BLD

## 2015-07-28 NOTE — Progress Notes (Signed)
Wayne Wolfe is here for his week 76 visit for The HAILO Study: A Long Term follow-up of Older HIV-Infected Adults in the ACTG, an observational study addressing the issues of aging, HIV infection and Inflammation.  He is complaining of bilat. Knee pain the past month and said he has been taking ibuprofen for it. He continues to have fatigue, night sweats and feels like his short term memory is getting worse. I talked to him about the Neurocognitive study seeing if tivicay or maraviroc helps improve neurocognitive function and he is interested in screening for it. I gave him a copy of the consent to take home and read. He does continue to have some numbness and tingling occasionally in his hands and feet. He will return in a couple to screen for A5324.

## 2015-07-29 LAB — CREATININE, URINE, RANDOM: Creatinine, Urine: 355 mg/dL (ref 20–370)

## 2015-08-10 ENCOUNTER — Encounter (INDEPENDENT_AMBULATORY_CARE_PROVIDER_SITE_OTHER): Payer: Self-pay | Admitting: *Deleted

## 2015-08-10 VITALS — BP 134/92 | HR 76 | Temp 98.0°F | Resp 16 | Ht 67.25 in | Wt 266.5 lb

## 2015-08-10 DIAGNOSIS — Z006 Encounter for examination for normal comparison and control in clinical research program: Secondary | ICD-10-CM

## 2015-08-10 LAB — CBC WITH DIFFERENTIAL/PLATELET
BASOS ABS: 0.1 10*3/uL (ref 0.0–0.1)
Basophils Relative: 2 % — ABNORMAL HIGH (ref 0–1)
EOS ABS: 0.2 10*3/uL (ref 0.0–0.7)
EOS PCT: 4 % (ref 0–5)
HCT: 50.7 % (ref 39.0–52.0)
Hemoglobin: 17.2 g/dL — ABNORMAL HIGH (ref 13.0–17.0)
Lymphocytes Relative: 51 % — ABNORMAL HIGH (ref 12–46)
Lymphs Abs: 2 10*3/uL (ref 0.7–4.0)
MCH: 30 pg (ref 26.0–34.0)
MCHC: 33.9 g/dL (ref 30.0–36.0)
MCV: 88.5 fL (ref 78.0–100.0)
MPV: 9 fL (ref 8.6–12.4)
Monocytes Absolute: 0.4 10*3/uL (ref 0.1–1.0)
Monocytes Relative: 11 % (ref 3–12)
Neutro Abs: 1.3 10*3/uL — ABNORMAL LOW (ref 1.7–7.7)
Neutrophils Relative %: 32 % — ABNORMAL LOW (ref 43–77)
PLATELETS: 323 10*3/uL (ref 150–400)
RBC: 5.73 MIL/uL (ref 4.22–5.81)
RDW: 15.5 % (ref 11.5–15.5)
WBC: 4 10*3/uL (ref 4.0–10.5)

## 2015-08-10 LAB — COMPREHENSIVE METABOLIC PANEL
ALT: 18 U/L (ref 9–46)
AST: 14 U/L (ref 10–35)
Albumin: 4.3 g/dL (ref 3.6–5.1)
Alkaline Phosphatase: 66 U/L (ref 40–115)
BILIRUBIN TOTAL: 0.5 mg/dL (ref 0.2–1.2)
BUN: 11 mg/dL (ref 7–25)
CHLORIDE: 99 mmol/L (ref 98–110)
CO2: 32 mmol/L — ABNORMAL HIGH (ref 20–31)
CREATININE: 1.15 mg/dL (ref 0.70–1.33)
Calcium: 9.7 mg/dL (ref 8.6–10.3)
Glucose, Bld: 121 mg/dL — ABNORMAL HIGH (ref 65–99)
Potassium: 4.9 mmol/L (ref 3.5–5.3)
Sodium: 138 mmol/L (ref 135–146)
Total Protein: 7.4 g/dL (ref 6.1–8.1)

## 2015-08-10 LAB — PHOSPHORUS: PHOSPHORUS: 3.6 mg/dL (ref 2.5–4.5)

## 2015-08-10 NOTE — Progress Notes (Signed)
Wayne Wolfe is here today to screen for the A5324: A Randomized Double Blinded, Placebo Controlled Trial Comparing Antiretroviral Intensification with Maraviroc or Dolutegravir for the Treatment of Cognitive Impairment in HIV. He says he has noticed an increase in short term memory loss over the past year and had agreed to screen for the study. Informed consent was obtained after he had time to review the consent and we discussed it in detail. He is currently on Complera and has never been on an integrase inhibitor. He complains of fatigue, occassional night sweats, occassional pain and numbness in feet and hands.he is diabetic and has been so since 2012. When he was originally diagnosed in 2009, he had developed cryptococal meningitis, pneumonia, acute renal failure and was hospitalized. He completely resolved from that but has noticed a decline in memory recently. He said that he lost his Mother in July and has been a little depressed about but it. If he is determined to be eligible for the study, we will schedule him for entry.

## 2015-08-10 NOTE — Progress Notes (Signed)
   Subjective:    Patient ID: Wayne Wolfe, male    DOB: 16-Feb-1966, 49 y.o.   MRN: [id number]  HPI    Review of Systems  Constitutional: Positive for fatigue.  HENT: Positive for dental problem.   Eyes: Negative.   Respiratory: Negative.   Cardiovascular: Negative.   Gastrointestinal: Negative.   Genitourinary: Negative.   Neurological: Negative.   Psychiatric/Behavioral: Negative.        Objective:   Physical Exam  Constitutional: He is oriented to person, place, and time. He appears well-nourished.  HENT:  Mouth/Throat: Oropharynx is clear and moist.  Eyes: No scleral icterus.  Neck: Normal range of motion.  Cardiovascular: Normal rate, regular rhythm, normal heart sounds and intact distal pulses.   Pulmonary/Chest: Effort normal and breath sounds normal.  Abdominal: Soft. Bowel sounds are normal.  Musculoskeletal: Normal range of motion.  Lymphadenopathy:    He has no cervical adenopathy.  Neurological: He is alert and oriented to person, place, and time.  Skin: Skin is warm and dry.  Psychiatric: He has a normal mood and affect.          Assessment & Plan:

## 2015-08-11 LAB — POCT URINALYSIS DIPSTICK
Bilirubin, UA: NEGATIVE
Blood, UA: NEGATIVE
Glucose, UA: NEGATIVE
KETONES UA: NEGATIVE
LEUKOCYTES UA: NEGATIVE
NITRITE UA: NEGATIVE
PH UA: 6
Spec Grav, UA: >=1.03
UROBILINOGEN UA: 1

## 2015-08-11 LAB — HEPATITIS C ANTIBODY: HCV AB: NEGATIVE

## 2015-08-11 LAB — RPR

## 2015-08-19 ENCOUNTER — Telehealth: Payer: Self-pay | Admitting: *Deleted

## 2015-08-19 NOTE — Telephone Encounter (Signed)
Wayne Wolfe called with complaint of Rt earache. He denies any discharge, fever , sore throat, etc. But says it has been hurting a few days. He took some claritin last night and says it's a little better. I told him to try an antihistamine and see if that helps relieve discomfort. If it persists and he develops fever, discharge then he would need to be seen.

## 2015-08-19 NOTE — Telephone Encounter (Signed)
Sounds appropriate thanks Maudie Mercury

## 2015-08-30 ENCOUNTER — Emergency Department (INDEPENDENT_AMBULATORY_CARE_PROVIDER_SITE_OTHER)
Admission: EM | Admit: 2015-08-30 | Discharge: 2015-08-30 | Disposition: A | Discharge status: Home / Self Care | Payer: Self-pay | Source: Home / Self Care | Attending: Emergency Medicine | Admitting: Emergency Medicine

## 2015-08-30 ENCOUNTER — Encounter (HOSPITAL_COMMUNITY): Payer: Self-pay | Admitting: Emergency Medicine

## 2015-08-30 DIAGNOSIS — H6692 Otitis media, unspecified, left ear: Secondary | ICD-10-CM

## 2015-08-30 DIAGNOSIS — I10 Essential (primary) hypertension: Secondary | ICD-10-CM

## 2015-08-30 MED ORDER — AMOXICILLIN 500 MG PO CAPS: 1000.0000 mg | ORAL_CAPSULE | Freq: Two times a day (BID) | ORAL | Status: DC

## 2015-08-30 NOTE — ED Notes (Signed)
C/o bilateral ear pain onset 1 week associated w/intermittent HAs and dizziness BP today = 228/107  Denies CP, SOB, dyspnea, diaphoresis, n/v A&O x4... No acute distress.

## 2015-08-30 NOTE — ED Provider Notes (Signed)
CSN: [id number]     Arrival date & time 08/30/15  1742 History   First MD Initiated Contact with Patient 08/30/15 1808     Chief Complaint  Patient presents with  . Otalgia   (Consider location/radiation/quality/duration/timing/severity/associated sxs/prior Treatment) HPI Comments: 49 year old male complaining of left earache off and on for the past week. He states the right ear is beginning to feel a little uncomfortable today. He has had no problem with hearing. No drainage from the ear. No tinnitus. He denies PND although he does have to clear his throat and occasionally has a raspy cough. Denies fever, chills, sore throat.   Past Medical History  Diagnosis Date  . Type 2 diabetes mellitus (HCC)     Well controlled on metformin  . Hypertension 2009    Well controlled on HCTZ  . HIV (human immunodeficiency virus infection) ([place]) 2009    11/20/2012 Last CD4 count 210 and VL <20   Past Surgical History  Procedure Laterality Date  . Carpal tunnel release Right 1990s  . None     Family History  Problem Relation Age of Onset  . Diabetes Mellitus II Sister   . Diabetes Mellitus II Mother   . Coronary artery disease Mother 28    CABGx6  . Coronary artery disease Sister 49    CABG  . Diabetes Mellitus II Sister   . Coronary artery disease Sister   . Diabetes Mellitus II Sister    Social History  Substance Use Topics  . Smoking status: Former Research scientist (life sciences)  . Smokeless tobacco: Never Used  . Alcohol Use: No     Comment: Last alcohol 10 years ago, former moderate use, never binge.     Review of Systems  Constitutional: Negative.  Negative for fever.  HENT: Positive for ear pain. Negative for congestion, nosebleeds, rhinorrhea, sinus pressure, sneezing and sore throat.   Eyes: Negative.   Respiratory: Negative for choking, chest tightness, shortness of breath and wheezing.   Cardiovascular: Negative for chest pain, palpitations and leg swelling.  Gastrointestinal: Negative.    Skin: Negative.   Psychiatric/Behavioral:       Patient states he has been stressed in the past week and states this may be wise blood pressure is elevated. He states he had it checked 2 weeks ago at his doctor's office and it was normal.    Allergies  Review of patient's allergies indicates no known allergies.  Home Medications   Prior to Admission medications   Medication Sig Start Date End Date Taking? Authorizing Provider  Emtricitab-Rilpivir-Tenofov DF 200-25-300 MG TABS Take 1 tablet by mouth daily. 06/20/15  Yes Truman Hayward, MD  hydrochlorothiazide (HYDRODIURIL) 25 MG tablet TAKE 1 TABLET BY MOUTH DAILY 10/18/14  Yes Truman Hayward, MD  metFORMIN (GLUCOPHAGE) 500 MG tablet Take 2 tablets (1,000 mg total) by mouth 2 (two) times daily with a meal. 08/11/14  Yes Truman Hayward, MD  pravastatin (PRAVACHOL) 20 MG tablet TAKE 1 TABLET BY MOUTH EVERY DAY 06/27/15  Yes Truman Hayward, MD  ALPRAZolam Duanne Moron) 0.5 MG tablet Take 1 tablet (0.5 mg total) by mouth at bedtime as needed for anxiety. 05/01/15   Kinnie Feil, MD  amoxicillin (AMOXIL) 500 MG capsule Take 2 capsules (1,000 mg total) by mouth 2 (two) times daily. 08/30/15   Janne Napoleon, NP   Meds Ordered and Administered this Visit  Medications - No data to display  BP 228/107 mmHg  Pulse 93  Temp(Src)  98.8 F (37.1 C) (Oral)  Resp 20  SpO2 99% No data found.   Physical Exam  Constitutional: He appears well-developed and well-nourished. No distress.  HENT:  Mouth/Throat: No oropharyngeal exudate.  Left TM with erythema and distortion and dullness. Right TM retracted. Oropharynx with minor erythema.  Eyes: Conjunctivae and EOM are normal.  Neck: Normal range of motion. Neck supple.  Cardiovascular: Normal rate, regular rhythm, normal heart sounds and intact distal pulses.   Pulmonary/Chest: Effort normal and breath sounds normal. No respiratory distress. He has no wheezes. He has no rales.   Musculoskeletal: He exhibits no edema.  Lymphadenopathy:    He has no cervical adenopathy.  Neurological: He is alert.  Skin: Skin is warm. No erythema.  Psychiatric: He has a normal mood and affect.  Nursing note and vitals reviewed.   ED Course  Procedures (including critical care time)  Labs Review Labs Reviewed - No data to display  Imaging Review No results found.   Visual Acuity Review  Right Eye Distance:   Left Eye Distance:   Bilateral Distance:    Right Eye Near:   Left Eye Near:    Bilateral Near:         MDM   1. Acute left otitis media, recurrence not specified, unspecified otitis media type   2. Essential hypertension    Amoxil as dir See your PCP in 7 d as directed For problems may return or go to hte ED.    Janne Napoleon, NP 08/30/15 [phone]

## 2015-08-30 NOTE — Discharge Instructions (Signed)
Hypertension Hypertension, commonly called high blood pressure, is when the force of blood pumping through your arteries is too strong. Your arteries are the blood vessels that carry blood from your heart throughout your body. A blood pressure reading consists of a higher number over a lower number, such as 110/72. The higher number (systolic) is the pressure inside your arteries when your heart pumps. The lower number (diastolic) is the pressure inside your arteries when your heart relaxes. Ideally you want your blood pressure below 120/80. Hypertension forces your heart to work harder to pump blood. Your arteries may become narrow or stiff. Having untreated or uncontrolled hypertension can cause heart attack, stroke, kidney disease, and other problems. RISK FACTORS Some risk factors for high blood pressure are controllable. Others are not.  Risk factors you cannot control include:   Race. You may be at higher risk if you are African American.  Age. Risk increases with age.  Gender. Men are at higher risk than women before age 45 years. After age 65, women are at higher risk than men. Risk factors you can control include:  Not getting enough exercise or physical activity.  Being overweight.  Getting too much fat, sugar, calories, or salt in your diet.  Drinking too much alcohol. SIGNS AND SYMPTOMS Hypertension does not usually cause signs or symptoms. Extremely high blood pressure (hypertensive crisis) may cause headache, anxiety, shortness of breath, and nosebleed. DIAGNOSIS To check if you have hypertension, your health care provider will measure your blood pressure while you are seated, with your arm held at the level of your heart. It should be measured at least twice using the same arm. Certain conditions can cause a difference in blood pressure between your right and left arms. A blood pressure reading that is higher than normal on one occasion does not mean that you need treatment. If  it is not clear whether you have high blood pressure, you may be asked to return on a different day to have your blood pressure checked again. Or, you may be asked to monitor your blood pressure at home for 1 or more weeks. TREATMENT Treating high blood pressure includes making lifestyle changes and possibly taking medicine. Living a healthy lifestyle can help lower high blood pressure. You may need to change some of your habits. Lifestyle changes may include:  Following the DASH diet. This diet is high in fruits, vegetables, and whole grains. It is low in salt, red meat, and added sugars.  Keep your sodium intake below 2,300 mg per day.  Getting at least 30-45 minutes of aerobic exercise at least 4 times per week.  Losing weight if necessary.  Not smoking.  Limiting alcoholic beverages.  Learning ways to reduce stress. Your health care provider may prescribe medicine if lifestyle changes are not enough to get your blood pressure under control, and if one of the following is true:  You are 18-59 years of age and your systolic blood pressure is above 140.  You are 60 years of age or older, and your systolic blood pressure is above 150.  Your diastolic blood pressure is above 90.  You have diabetes, and your systolic blood pressure is over 140 or your diastolic blood pressure is over 90.  You have kidney disease and your blood pressure is above 140/90.  You have heart disease and your blood pressure is above 140/90. Your personal target blood pressure may vary depending on your medical conditions, your age, and other factors. HOME CARE INSTRUCTIONS    Have your blood pressure rechecked as directed by your health care provider.   Take medicines only as directed by your health care provider. Follow the directions carefully. Blood pressure medicines must be taken as prescribed. The medicine does not work as well when you skip doses. Skipping doses also puts you at risk for  problems.  Do not smoke.   Monitor your blood pressure at home as directed by your health care provider. SEEK MEDICAL CARE IF:   You think you are having a reaction to medicines taken.  You have recurrent headaches or feel dizzy.  You have swelling in your ankles.  You have trouble with your vision. SEEK IMMEDIATE MEDICAL CARE IF:  You develop a severe headache or confusion.  You have unusual weakness, numbness, or feel faint.  You have severe chest or abdominal pain.  You vomit repeatedly.  You have trouble breathing. MAKE SURE YOU:   Understand these instructions.  Will watch your condition.  Will get help right away if you are not doing well or get worse.   This information is not intended to replace advice given to you by your health care provider. Make sure you discuss any questions you have with your health care provider.   Document Released: 09/17/2005 Document Revised: 02/01/2015 Document Reviewed: 07/10/2013 Elsevier Interactive Patient Education 2016 Elsevier Inc.  Otitis Media, Adult Otitis media is redness, soreness, and inflammation of the middle ear. Otitis media may be caused by allergies or, most commonly, by infection. Often it occurs as a complication of the common cold. SIGNS AND SYMPTOMS Symptoms of otitis media may include:  Earache.  Fever.  Ringing in your ear.  Headache.  Leakage of fluid from the ear. DIAGNOSIS To diagnose otitis media, your health care provider will examine your ear with an otoscope. This is an instrument that allows your health care provider to see into your ear in order to examine your eardrum. Your health care provider also will ask you questions about your symptoms. TREATMENT  Typically, otitis media resolves on its own within 3-5 days. Your health care provider may prescribe medicine to ease your symptoms of pain. If otitis media does not resolve within 5 days or is recurrent, your health care provider may  prescribe antibiotic medicines if he or she suspects that a bacterial infection is the cause. HOME CARE INSTRUCTIONS   If you were prescribed an antibiotic medicine, finish it all even if you start to feel better.  Take medicines only as directed by your health care provider.  Keep all follow-up visits as directed by your health care provider. SEEK MEDICAL CARE IF:  You have otitis media only in one ear, or bleeding from your nose, or both.  You notice a lump on your neck.  You are not getting better in 3-5 days.  You feel worse instead of better. SEEK IMMEDIATE MEDICAL CARE IF:   You have pain that is not controlled with medicine.  You have swelling, redness, or pain around your ear or stiffness in your neck.  You notice that part of your face is paralyzed.  You notice that the bone behind your ear (mastoid) is tender when you touch it. MAKE SURE YOU:   Understand these instructions.  Will watch your condition.  Will get help right away if you are not doing well or get worse.   This information is not intended to replace advice given to you by your health care provider. Make sure you discuss any questions you  have with your health care provider.   Document Released: 06/22/2004 Document Revised: 10/08/2014 Document Reviewed: 04/14/2013 Elsevier Interactive Patient Education [employer].

## 2015-09-01 ENCOUNTER — Encounter (INDEPENDENT_AMBULATORY_CARE_PROVIDER_SITE_OTHER): Payer: Self-pay | Admitting: *Deleted

## 2015-09-01 VITALS — BP 129/89 | HR 78 | Temp 98.3°F | Resp 16 | Wt 264.0 lb

## 2015-09-01 DIAGNOSIS — Z006 Encounter for examination for normal comparison and control in clinical research program: Secondary | ICD-10-CM

## 2015-09-01 NOTE — Progress Notes (Signed)
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 He was recently seen at urgent Care for an earache and was given amoxil for treatment. He says it is getting better now but still a little bothersome. He is to see Dr. Tommy Medal next Wednesday and then entry is planned for Thursday.

## 2015-09-07 ENCOUNTER — Encounter: Payer: Self-pay | Admitting: Infectious Disease

## 2015-09-07 ENCOUNTER — Ambulatory Visit (INDEPENDENT_AMBULATORY_CARE_PROVIDER_SITE_OTHER): Payer: Self-pay | Admitting: Infectious Disease

## 2015-09-07 VITALS — BP 133/83 | HR 76 | Temp 98.7°F | Ht 67.25 in | Wt 269.4 lb

## 2015-09-07 DIAGNOSIS — B20 Human immunodeficiency virus [HIV] disease: Secondary | ICD-10-CM

## 2015-09-07 DIAGNOSIS — I10 Essential (primary) hypertension: Secondary | ICD-10-CM

## 2015-09-07 DIAGNOSIS — E119 Type 2 diabetes mellitus without complications: Secondary | ICD-10-CM

## 2015-09-07 DIAGNOSIS — E785 Hyperlipidemia, unspecified: Secondary | ICD-10-CM

## 2015-09-07 MED ORDER — EMTRICITAB-RILPIVIR-TENOFOV AF 200-25-25 MG PO TABS: 1.0000 | ORAL_TABLET | Freq: Every day | ORAL | Status: DC

## 2015-09-07 NOTE — Progress Notes (Signed)
Chief complaint: followup for HIV  Subjective:    Patient ID: Wayne Wolfe, male    DOB: 07-Oct-1965, 49 y.o.   MRN: [id number]  HIV Positive/AIDS   49  year old man previously perfectly suppressed on Prezista Norvir and Truvada whom I changed over to Atripla and then to [place] with VL<20.  He has been doing perfectly with suppression of virus and healthy CD4.  Lab Results  Component Value Date   HIV1RNAQUANT <20 12/07/2013   Lab Results  Component Value Date   CD4TABS 342 07/28/2015   CD4TABS 275 07/29/2014   CD4TABS 310* 12/07/2013   He is taking metformin for DM and statin.  He is going to be enrolled into the Neurocognitive study for ACTG..  Past Medical History  Diagnosis Date  . Type 2 diabetes mellitus (HCC)     Well controlled on metformin  . Hypertension 2009    Well controlled on HCTZ  . HIV (human immunodeficiency virus infection) ([place]) 2009    11/20/2012 Last CD4 count 210 and VL <20    Past Surgical History  Procedure Laterality Date  . Carpal tunnel release Right 1990s  . None      Family History  Problem Relation Age of Onset  . Diabetes Mellitus II Sister   . Diabetes Mellitus II Mother   . Coronary artery disease Mother 106    CABGx6  . Coronary artery disease Sister 38    CABG  . Diabetes Mellitus II Sister   . Coronary artery disease Sister   . Diabetes Mellitus II Sister       Social History   Social History  . Marital Status: Single    Spouse Name: N/A  . Number of Children: N/A  . Years of Education: 13   Occupational History  .     Social History Main Topics  . Smoking status: Former Research scientist (life sciences)  . Smokeless tobacco: Never Used  . Alcohol Use: No     Comment: Last alcohol 10 years ago, former moderate use, never binge.   . Drug Use: No  . Sexual Activity: Not Asked   Other Topics Concern  . None   Social History Narrative   On disability. Lives in [place].     No Known Allergies   Current outpatient  prescriptions:  .  amoxicillin (AMOXIL) 500 MG capsule, Take 2 capsules (1,000 mg total) by mouth 2 (two) times daily., Disp: 40 capsule, Rfl: 0 .  Emtricitab-Rilpivir-Tenofov DF 200-25-300 MG TABS, Take 1 tablet by mouth daily., Disp: 30 tablet, Rfl: 5 .  hydrochlorothiazide (HYDRODIURIL) 25 MG tablet, TAKE 1 TABLET BY MOUTH DAILY, Disp: 90 tablet, Rfl: 3 .  metFORMIN (GLUCOPHAGE) 500 MG tablet, Take 2 tablets (1,000 mg total) by mouth 2 (two) times daily with a meal., Disp: 60 tablet, Rfl: 5 .  pravastatin (PRAVACHOL) 20 MG tablet, TAKE 1 TABLET BY MOUTH EVERY DAY, Disp: 30 tablet, Rfl: 11 .  emtricitabine-rilpivir-tenofovir AF (ODEFSEY) 200-25-25 MG TABS tablet, Take 1 tablet by mouth daily with breakfast., Disp: 30 tablet, Rfl: 11    Review of Systems  Constitutional: Negative for fever, chills, diaphoresis, activity change, appetite change, fatigue and unexpected weight change.  HENT: Negative for congestion, rhinorrhea, sinus pressure, sneezing, sore throat and trouble swallowing.   Eyes: Negative for photophobia and visual disturbance.  Respiratory: Negative for cough, chest tightness, shortness of breath, wheezing and stridor.   Cardiovascular: Negative for chest pain, palpitations and leg swelling.  Gastrointestinal: Negative for nausea, vomiting,  abdominal pain, diarrhea, constipation, blood in stool, abdominal distention and anal bleeding.  Genitourinary: Negative for dysuria, hematuria, flank pain and difficulty urinating.  Musculoskeletal: Negative for myalgias, back pain, joint swelling, arthralgias and gait problem.  Skin: Negative for color change, pallor, rash and wound.  Neurological: Negative for dizziness, tremors, weakness and light-headedness.  Hematological: Negative for adenopathy. Does not bruise/bleed easily.  Psychiatric/Behavioral: Positive for decreased concentration. Negative for behavioral problems, confusion, sleep disturbance, dysphoric mood and agitation.        Objective:   Physical Exam  Constitutional: He is oriented to person, place, and time. He appears well-developed and well-nourished. No distress.  HENT:  Head: Normocephalic and atraumatic.  Mouth/Throat: Oropharynx is clear and moist. No oropharyngeal exudate.  Eyes: Conjunctivae and EOM are normal. Pupils are equal, round, and reactive to light. No scleral icterus.  Neck: Normal range of motion. Neck supple. No JVD present.  Cardiovascular: Normal heart sounds.   Pulmonary/Chest: Effort normal and breath sounds normal. No respiratory distress. He has no wheezes. He has no rales. He exhibits no tenderness.  Abdominal: He exhibits no distension.  Musculoskeletal: He exhibits no edema or tenderness.  Lymphadenopathy:    He has no cervical adenopathy.  Neurological: He is alert and oriented to person, place, and time. He exhibits normal muscle tone. Coordination normal.  Skin: Skin is warm and dry. He is not diaphoretic. No erythema. No pallor.  Psychiatric: He has a normal mood and affect. His behavior is normal. Judgment and thought content normal.          Assessment & Plan:   HIV:  Change to ODEFSEY  rtc in  6 months.   DM: continue ,etformin.   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

## 2015-09-08 ENCOUNTER — Encounter (INDEPENDENT_AMBULATORY_CARE_PROVIDER_SITE_OTHER): Payer: Self-pay | Admitting: *Deleted

## 2015-09-08 VITALS — BP 138/89 | HR 70 | Temp 98.1°F | Resp 16 | Wt 271.5 lb

## 2015-09-08 DIAGNOSIS — Z006 Encounter for examination for normal comparison and control in clinical research program: Secondary | ICD-10-CM

## 2015-09-08 LAB — COMPREHENSIVE METABOLIC PANEL
ALK PHOS: 55 U/L (ref 40–115)
ALT: 19 U/L (ref 9–46)
AST: 24 U/L (ref 10–35)
Albumin: 3.6 g/dL (ref 3.6–5.1)
BUN: 9 mg/dL (ref 7–25)
CALCIUM: 9.2 mg/dL (ref 8.6–10.3)
CHLORIDE: 103 mmol/L (ref 98–110)
CO2: 28 mmol/L (ref 20–31)
Creat: 1.09 mg/dL (ref 0.70–1.33)
GLUCOSE: 139 mg/dL — AB (ref 65–99)
POTASSIUM: 4.4 mmol/L (ref 3.5–5.3)
Sodium: 139 mmol/L (ref 135–146)
Total Bilirubin: 0.3 mg/dL (ref 0.2–1.2)
Total Protein: 6.4 g/dL (ref 6.1–8.1)

## 2015-09-08 LAB — CD4/CD8 (T-HELPER/T-SUPPRESSOR CELL)
CD4%: 18
CD4: 378
CD8 % Suppressor T Cell: 59.4
CD8: 1247

## 2015-09-08 LAB — PHOSPHORUS: Phosphorus: 3.4 mg/dL (ref 2.5–4.5)

## 2015-09-08 LAB — HIV-1 RNA QUANT-NO REFLEX-BLD

## 2015-09-08 NOTE — Progress Notes (Signed)
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 Reviewed entry checklist with patient. Randomized to Group A Maraviroc/placebo twice a day and Dolutegravir/Placebo daily. Reviewed possible side effects related to study medication (rash, N/V/D, dizziness, stomach pains, yellowing of skin/eyes). Instructed to call/notify us of any side effects or adverse events develop after starting medication. Continues to c/o (L) earache but states discomfort much improved. Next [id number] visit scheduled for September 22, 2015 at 10am.

## 2015-09-13 ENCOUNTER — Other Ambulatory Visit: Payer: Self-pay | Admitting: *Deleted

## 2015-09-13 MED ORDER — DOLUTEGRAVIR SODIUM 50 MG PO TABS: 50.0000 mg | ORAL_TABLET | Freq: Every day | ORAL | Status: DC

## 2015-09-13 MED ORDER — MARAVIROC 150 MG PO TABS: 150.0000 mg | ORAL_TABLET | Freq: Two times a day (BID) | ORAL | Status: DC

## 2015-09-22 ENCOUNTER — Encounter (INDEPENDENT_AMBULATORY_CARE_PROVIDER_SITE_OTHER): Payer: Self-pay | Admitting: *Deleted

## 2015-09-22 DIAGNOSIS — Z006 Encounter for examination for normal comparison and control in clinical research program: Secondary | ICD-10-CM

## 2015-09-22 NOTE — Progress Notes (Unsigned)
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 Has experieced some mild intermittent nausea since starting study medication. Denies any vomiting or abdominal pain. Instructed to keep [place] updated and to notify us of any changes or increase in symptoms. Agreed. Started his new HIV medication, Odefsey daily,  per Dr. Tommy Medal on 09/20/15. States that he is tolerating medication without any problems at this time. Next study visit scheduled for 10/06/15.

## 2015-10-06 ENCOUNTER — Encounter (INDEPENDENT_AMBULATORY_CARE_PROVIDER_SITE_OTHER): Payer: Self-pay | Admitting: *Deleted

## 2015-10-06 VITALS — BP 140/82 | HR 86 | Temp 98.3°F | Resp 16 | Wt 271.5 lb

## 2015-10-06 DIAGNOSIS — Z006 Encounter for examination for normal comparison and control in clinical research program: Secondary | ICD-10-CM

## 2015-10-06 NOTE — Progress Notes (Signed)
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 No new complaints verbalized. States good adherence with medications. Next visit scheduled for 02/14/16 at 9:00am.

## 2015-10-07 LAB — COMPREHENSIVE METABOLIC PANEL
ALBUMIN: 4 g/dL (ref 3.6–5.1)
ALK PHOS: 74 U/L (ref 40–115)
ALT: 20 U/L (ref 9–46)
AST: 15 U/L (ref 10–35)
BILIRUBIN TOTAL: 0.6 mg/dL (ref 0.2–1.2)
BUN: 10 mg/dL (ref 7–25)
CALCIUM: 9.3 mg/dL (ref 8.6–10.3)
CO2: 28 mmol/L (ref 20–31)
Chloride: 96 mmol/L — ABNORMAL LOW (ref 98–110)
Creat: 1.21 mg/dL (ref 0.70–1.33)
GLUCOSE: 217 mg/dL — AB (ref 65–99)
POTASSIUM: 3.8 mmol/L (ref 3.5–5.3)
Sodium: 136 mmol/L (ref 135–146)
TOTAL PROTEIN: 6.8 g/dL (ref 6.1–8.1)

## 2015-10-07 LAB — PHOSPHORUS: PHOSPHORUS: 3.3 mg/dL (ref 2.5–4.5)

## 2015-11-01 ENCOUNTER — Ambulatory Visit: Payer: Self-pay

## 2015-12-12 ENCOUNTER — Encounter (INDEPENDENT_AMBULATORY_CARE_PROVIDER_SITE_OTHER): Payer: Self-pay | Admitting: *Deleted

## 2015-12-12 VITALS — BP 120/82 | HR 73 | Temp 98.0°F | Resp 16 | Ht 68.0 in | Wt 263.8 lb

## 2015-12-12 DIAGNOSIS — Z006 Encounter for examination for normal comparison and control in clinical research program: Secondary | ICD-10-CM

## 2015-12-12 LAB — COMPREHENSIVE METABOLIC PANEL
ALK PHOS: 71 U/L (ref 40–115)
ALT: 25 U/L (ref 9–46)
AST: 18 U/L (ref 10–35)
Albumin: 4.1 g/dL (ref 3.6–5.1)
BILIRUBIN TOTAL: 0.7 mg/dL (ref 0.2–1.2)
BUN: 11 mg/dL (ref 7–25)
CO2: 28 mmol/L (ref 20–31)
CREATININE: 0.95 mg/dL (ref 0.70–1.33)
Calcium: 9.7 mg/dL (ref 8.6–10.3)
Chloride: 104 mmol/L (ref 98–110)
Glucose, Bld: 221 mg/dL — ABNORMAL HIGH (ref 65–99)
POTASSIUM: 4.8 mmol/L (ref 3.5–5.3)
SODIUM: 141 mmol/L (ref 135–146)
TOTAL PROTEIN: 7 g/dL (ref 6.1–8.1)

## 2015-12-12 LAB — HIV-1 RNA QUANT-NO REFLEX-BLD: HIV-1 RNA Viral Load: <40

## 2015-12-12 LAB — CD4/CD8 (T-HELPER/T-SUPPRESSOR CELL)
CD4%: 18.9
CD4: 340
CD8 T CELL SUPPRESSOR: 59
CD8: 1062

## 2015-12-12 LAB — PHOSPHORUS: PHOSPHORUS: 3.8 mg/dL (ref 2.5–4.5)

## 2015-12-12 NOTE — Progress Notes (Signed)
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 States good adherence with study medication. Did complain of urinary frequency and intermittent flank pain. States that this started last week. Denied dysuria or urinary urgency. Afebrile today. Urine collected today for UA. Next visit scheduled for Feb 16, 2016 at 10:00am for [place] & [id number].

## 2015-12-13 LAB — URINALYSIS, ROUTINE W REFLEX MICROSCOPIC
BILIRUBIN URINE: NEGATIVE
Hgb urine dipstick: NEGATIVE
KETONES UR: NEGATIVE
Leukocytes, UA: NEGATIVE
Nitrite: NEGATIVE
SPECIFIC GRAVITY, URINE: 1.033 (ref 1.001–1.035)
pH: 5.5 (ref 5.0–8.0)

## 2015-12-13 LAB — URINALYSIS, MICROSCOPIC ONLY
Bacteria, UA: NONE SEEN [HPF]
CRYSTALS: NONE SEEN [HPF]
Casts: NONE SEEN [LPF]
RBC / HPF: NONE SEEN RBC/HPF (ref <=2)
Squamous Epithelial / LPF: NONE SEEN [HPF] (ref <=5)
WBC UA: NONE SEEN WBC/HPF (ref <=5)
YEAST: NONE SEEN [HPF]

## 2016-01-02 ENCOUNTER — Encounter: Payer: Self-pay | Admitting: Infectious Disease

## 2016-01-09 ENCOUNTER — Other Ambulatory Visit: Payer: Self-pay | Admitting: Infectious Disease

## 2016-01-20 ENCOUNTER — Ambulatory Visit (INDEPENDENT_AMBULATORY_CARE_PROVIDER_SITE_OTHER): Payer: Self-pay | Admitting: Pulmonary Disease

## 2016-01-20 ENCOUNTER — Encounter: Payer: Self-pay | Admitting: Pulmonary Disease

## 2016-01-20 ENCOUNTER — Telehealth: Payer: Self-pay | Admitting: Dietician

## 2016-01-20 VITALS — BP 130/91 | HR 79 | Temp 99.2°F | Wt 259.4 lb

## 2016-01-20 DIAGNOSIS — E118 Type 2 diabetes mellitus with unspecified complications: Secondary | ICD-10-CM

## 2016-01-20 DIAGNOSIS — E1165 Type 2 diabetes mellitus with hyperglycemia: Secondary | ICD-10-CM

## 2016-01-20 DIAGNOSIS — Z7984 Long term (current) use of oral hypoglycemic drugs: Secondary | ICD-10-CM

## 2016-01-20 DIAGNOSIS — N50819 Testicular pain, unspecified: Secondary | ICD-10-CM | POA: Insufficient documentation

## 2016-01-20 DIAGNOSIS — I10 Essential (primary) hypertension: Secondary | ICD-10-CM

## 2016-01-20 DIAGNOSIS — IMO0002 Reserved for concepts with insufficient information to code with codable children: Secondary | ICD-10-CM

## 2016-01-20 LAB — POCT GLYCOSYLATED HEMOGLOBIN (HGB A1C): HEMOGLOBIN A1C: 13.4

## 2016-01-20 LAB — GLUCOSE, CAPILLARY: Glucose-Capillary: 325 mg/dL — ABNORMAL HIGH (ref 65–99)

## 2016-01-20 MED ORDER — WAVESENSE AMP W/DEVICE KIT: PACK | Status: DC

## 2016-01-20 NOTE — Patient Instructions (Addendum)
Take metformin twice a day with meals for the next week. On Wednesday, April 26th, start taking two tablets of metformin with breakfast and one tablet with supper. On Wednesday, May 3rd, start taking two tablets of metformin with breakfast and two tablets with supper.  Follow up in 6 weeks with your meter.

## 2016-01-20 NOTE — Assessment & Plan Note (Addendum)
BP 130/91. Diastolic above goal. Previously had good control on HCTZ - likely dietary indiscretions causing elevated BP.   Medical nutrition therapy referral placed. Continue HCTZ 25mg  daily. Consider adding lisinopril given his DM. Will have his BP rechecked at follow up.

## 2016-01-20 NOTE — Telephone Encounter (Signed)
called in meter, strips lancets to [employer]. Met briefly with patient offered assistance with diabetes management. Patient plans to schedule at a better time. He was provided with a handout" know your number' per his request and diabetes educator's direct phone number.

## 2016-01-20 NOTE — Assessment & Plan Note (Signed)
A1c deteriorated. Likely due to dietary indiscretions.  Increase metformin to goal of 1000mg  BID Referral to medical nutrition therapy Follow up in 6 weeks.

## 2016-01-20 NOTE — Assessment & Plan Note (Addendum)
Sharp pain previously but dull pain now. None presently. No abnormalties noted on exam of scrotum and testes. Increased urine frequency and urgency but this may be related to hyperglycemia.  UA, UCx Continue to monitor. If recurs or worsens, consider [place] testes.  ADDENDUM: UA and UCx unremarkable.

## 2016-01-20 NOTE — Progress Notes (Signed)
   Subjective:    Patient ID: Wayne Wolfe, male    DOB: May 09, 1966, 50 y.o.   MRN: [id number]  HPI Wayne Wolfe is a 50 year old man with history of DM2, HTN, HIV presenting for follow up of DM2.   Testicular pain: Urinary frequency and urgency. Nagging pain in both sides. Had it a couple of times 3-4 months ago - sharp for a few seconds. Last few days started having pain again but not sharp - just dull pain. No trauma. No past surgeries. No dysuria. Gets diaphoretic occasionally but no fevers or chills.  Last saw [facility] 09/07/2015. HIV regimen changed to [facility] and follow up in 6 months  DM2: Did not take metformin today. Missed 2 other doses this past week. Eating habits have changed. Having eating binges since he lost his mother in July.   Review of Systems Eyes: occasional blurry vision Ears, nose, mouth, throat, and face: occasional cough productive of clear mucous Gastrointestinal: no vomiting, no abdominal pain, no constipation, no diarrhea Integument: no rash Hematologic/lymphatic: no edema Musculoskeletal: no arthralgias, no myalgias Neurological: no weakness  Past Medical History  Diagnosis Date  . Type 2 diabetes mellitus (HCC)     Well controlled on metformin  . Hypertension 2009    Well controlled on HCTZ  . HIV (human immunodeficiency virus infection) ([place]) 2009    11/20/2012 Last CD4 count 210 and VL <20   Current Outpatient Prescriptions on File Prior to Visit  Medication Sig Dispense Refill  . amoxicillin (AMOXIL) 500 MG capsule Take 2 capsules (1,000 mg total) by mouth 2 (two) times daily. 40 capsule 0  . dolutegravir (TIVICAY) 50 MG tablet Take 1 tablet (50 mg total) by mouth daily. This is study provided, may be placebo. Do not fill prescription. 30 tablet 11  . Emtricitab-Rilpivir-Tenofov DF 200-25-300 MG TABS Take 1 tablet by mouth daily. 30 tablet 5  . emtricitabine-rilpivir-tenofovir AF (ODEFSEY) 200-25-25 MG TABS tablet Take 1 tablet by mouth daily  with breakfast. 30 tablet 11  . hydrochlorothiazide (HYDRODIURIL) 25 MG tablet TAKE 1 TABLET BY MOUTH DAILY 90 tablet 1  . maraviroc (SELZENTRY) 150 MG tablet Take 1 tablet (150 mg total) by mouth 2 (two) times daily. Study provided, may be placebo. Do not fill prescription. 60 tablet 11  . metFORMIN (GLUCOPHAGE) 500 MG tablet TAKE 2 TABLETS BY MOUTH TWICE DAILY WITH MEALS 120 tablet 5  . pravastatin (PRAVACHOL) 20 MG tablet TAKE 1 TABLET BY MOUTH EVERY DAY 30 tablet 11   No current facility-administered medications on file prior to visit.    Objective:   Physical Exam Blood pressure 130/91, pulse 79, temperature 99.2 F (37.3 C), temperature source Oral, weight 259 lb 6.4 oz (117.663 kg), SpO2 99 %. General Apperance: NAD HEENT: Normocephalic, atraumatic, anicteric sclera Neck: Supple, trachea midline Lungs: Clear to auscultation bilaterally. No wheezes, rhonchi or rales. Breathing comfortably Heart: Regular rate and rhythm Abdomen: Soft, nontender, nondistended, no rebound/guarding GU: No lesions on scrotum. Testes smooth with and equal bilaterally. No tenderness to palpation. Extremities: Warm and well perfused, no edema Skin: No rashes or lesions Neurologic: Alert and interactive. No gross deficits.    Assessment & Plan:  Please refer to problem based charting.

## 2016-01-20 NOTE — Progress Notes (Signed)
Medicine attending: Medical history, presenting problems, physical findings, and medications, reviewed with resident physician Dr Jennifer Krall on the day of the patient visit and I concur with her evaluation and management plan. 

## 2016-01-21 LAB — URINALYSIS, ROUTINE W REFLEX MICROSCOPIC
Bilirubin, UA: NEGATIVE
Ketones, UA: NEGATIVE
Leukocytes, UA: NEGATIVE
NITRITE UA: NEGATIVE
PH UA: 5.5 (ref 5.0–7.5)
Protein, UA: NEGATIVE
RBC, UA: NEGATIVE
Specific Gravity, UA: 1.027 (ref 1.005–1.030)
Urobilinogen, Ur: 0.2 mg/dL (ref 0.2–1.0)

## 2016-01-21 LAB — URINE CULTURE: ORGANISM ID, BACTERIA: NO GROWTH

## 2016-01-21 LAB — MICROALBUMIN / CREATININE URINE RATIO: CREATININE, UR: 50.9 mg/dL

## 2016-02-16 ENCOUNTER — Encounter (INDEPENDENT_AMBULATORY_CARE_PROVIDER_SITE_OTHER): Payer: Self-pay | Admitting: *Deleted

## 2016-02-16 VITALS — BP 130/90 | HR 83 | Temp 98.3°F | Resp 16 | Wt 254.5 lb

## 2016-02-16 DIAGNOSIS — Z006 Encounter for examination for normal comparison and control in clinical research program: Secondary | ICD-10-CM

## 2016-02-16 LAB — CD4/CD8 (T-HELPER/T-SUPPRESSOR CELL)
CD4%: 19.7
CD4: 394
CD8 % Suppressor T Cell: 57.5
CD8: 1150

## 2016-02-16 LAB — COMPREHENSIVE METABOLIC PANEL
ALT: 18 U/L (ref 9–46)
AST: 13 U/L (ref 10–35)
Albumin: 4.1 g/dL (ref 3.6–5.1)
Alkaline Phosphatase: 63 U/L (ref 40–115)
BUN: 9 mg/dL (ref 7–25)
CHLORIDE: 100 mmol/L (ref 98–110)
CO2: 28 mmol/L (ref 20–31)
Calcium: 9.4 mg/dL (ref 8.6–10.3)
Creat: 1.25 mg/dL (ref 0.70–1.33)
GLUCOSE: 141 mg/dL — AB (ref 65–99)
POTASSIUM: 4.2 mmol/L (ref 3.5–5.3)
Sodium: 139 mmol/L (ref 135–146)
Total Bilirubin: 0.7 mg/dL (ref 0.2–1.2)
Total Protein: 7 g/dL (ref 6.1–8.1)

## 2016-02-16 LAB — HIV-1 RNA QUANT-NO REFLEX-BLD

## 2016-02-16 LAB — PHOSPHORUS: PHOSPHORUS: 3.6 mg/dL (ref 2.5–4.5)

## 2016-02-17 NOTE — Progress Notes (Signed)
Wayne Wolfe was here for 2 research visits. 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 He denies any new problems. He says he has been trying to manage his diabetes better and has lost some weight by exercising more. He say he has been very adherent with his meds and never misses a dose. He continues to feel like he is forgetful and hasn't noticed any difference since taking the study drugs. Neuro testing was performed. He will return in August to pick up more study drug and the visit is scheduled for 08/09/16.

## 2016-02-24 ENCOUNTER — Ambulatory Visit (INDEPENDENT_AMBULATORY_CARE_PROVIDER_SITE_OTHER): Payer: Self-pay | Admitting: Internal Medicine

## 2016-02-24 ENCOUNTER — Encounter: Payer: Self-pay | Admitting: Internal Medicine

## 2016-02-24 VITALS — BP 116/84 | HR 98 | Temp 98.4°F | Ht 67.4 in | Wt 257.5 lb

## 2016-02-24 DIAGNOSIS — E118 Type 2 diabetes mellitus with unspecified complications: Principal | ICD-10-CM

## 2016-02-24 DIAGNOSIS — Z1211 Encounter for screening for malignant neoplasm of colon: Secondary | ICD-10-CM | POA: Insufficient documentation

## 2016-02-24 DIAGNOSIS — Z7984 Long term (current) use of oral hypoglycemic drugs: Secondary | ICD-10-CM

## 2016-02-24 DIAGNOSIS — I152 Hypertension secondary to endocrine disorders: Secondary | ICD-10-CM

## 2016-02-24 DIAGNOSIS — IMO0002 Reserved for concepts with insufficient information to code with codable children: Secondary | ICD-10-CM

## 2016-02-24 DIAGNOSIS — E1165 Type 2 diabetes mellitus with hyperglycemia: Secondary | ICD-10-CM

## 2016-02-24 DIAGNOSIS — I10 Essential (primary) hypertension: Secondary | ICD-10-CM

## 2016-02-24 DIAGNOSIS — E1159 Type 2 diabetes mellitus with other circulatory complications: Secondary | ICD-10-CM

## 2016-02-24 LAB — GLUCOSE, CAPILLARY: Glucose-Capillary: 165 mg/dL — ABNORMAL HIGH (ref 65–99)

## 2016-02-24 MED ORDER — HYDROCHLOROTHIAZIDE 25 MG PO TABS: 25.0000 mg | ORAL_TABLET | Freq: Every day | ORAL | Status: DC

## 2016-02-24 NOTE — Assessment & Plan Note (Signed)
Assessment Since increasing his metformin to 1000 mg twice daily, he feels he is having difficulty tolerating the loose stools associated with this medication. His A1c per review of the chart was mostly trending 6-8 though increased to 13.4 over the last 6 months prior to his last office visit in April. He acknowledges dietary indiscretion, notably with soda, and his sensed ceased drinking sodas. He does indulge in an occasional Gatorade every so often but denies any signs of hyperglycemia, like polyuria, polydipsia. He would like to try non-insulin therapies though is unsure if it would affect his eligibility to remain in the clinical trial.  Plan -Recommended to the patient to try canagliflozin and provided him with a 30 day voucher so that we may initiate this medication and decrease metformin back to 500 mg twice daily and consider even combining these 2 medications through [facility] in the future to which he acknowledged understanding. -Coordinate with ID physician with plan to follow-up in one month for definitive answer -Offered referral to eye exam today though he reports he will make his own appointment as he is due for his annual visit

## 2016-02-24 NOTE — Patient Instructions (Signed)
I will check with Dr. Tommy Medal about the medication changes and give you a call next week.  Please take the Invok[employer] card in the mean time in case we are okay with switching it. We can combine it with metformin at some point as well.  For now, take 1000mg  twice daily as best as possible.

## 2016-02-24 NOTE — Assessment & Plan Note (Signed)
Assessment His blood pressure today is 116/84. He is currently on HCTZ 25 mg daily. At his last visit, urine microalbumin/creatinine ratio was reassuring for no frank proteinuria.  Plan -Explained to the patient that his blood pressure is well-controlled on one agent and an ACE inhibitor or ARB would be the preferred agent given his comorbid diabetes. -Coordinate with his ID doctor to ensure that switching to this medication will not preclude him from his clinical trial

## 2016-02-24 NOTE — Progress Notes (Signed)
   Subjective:    Patient ID: Wayne Wolfe, male    DOB: [birth year], 50 y.o.   MRN: [id number]  HPI Wayne Wolfe is a 50 year old male who presents today for diabetes. Please see assessment & plan for status of chronic medical problems.    Review of Systems     Objective:   Physical Exam  Constitutional: He is oriented to person, place, and time. He appears well-developed and well-nourished.  HENT:  Head: Normocephalic and atraumatic.  Eyes: Conjunctivae are normal. Pupils are equal, round, and reactive to light.  Neck: Normal range of motion. No tracheal deviation present.  Cardiovascular: Normal rate and regular rhythm.   Pulmonary/Chest: Effort normal. No respiratory distress.  Neurological: He is alert and oriented to person, place, and time.  Skin: Skin is warm and dry.          Assessment & Plan:

## 2016-02-24 NOTE — Assessment & Plan Note (Addendum)
Assessment He would like to undergo colonoscopy though I explained to him I do not know how long it usually takes to get this scheduled with the orange card. I offered him annual stool card testing to which he would like for me to check on the colonoscopy and get back to him. Otherwise, he denies colon cancer in first-degree relatives, changes in bowel movements, changes in appetite, changes in energy.  Plan -Check to see if referral can be made for screening colonoscopy or provide annual fecal occult blood testing stool cards at follow-up in one month

## 2016-03-01 NOTE — Progress Notes (Signed)
Internal Medicine Clinic Attending  Case discussed with Dr. Patel,Rushil at the time of the visit.  We reviewed the resident's history and exam and pertinent patient test results.  I agree with the assessment, diagnosis, and plan of care documented in the resident's note.  

## 2016-03-02 ENCOUNTER — Ambulatory Visit: Payer: Self-pay | Admitting: Dietician

## 2016-03-02 ENCOUNTER — Encounter: Payer: Self-pay | Admitting: Internal Medicine

## 2016-03-16 ENCOUNTER — Encounter: Payer: Self-pay | Admitting: Infectious Disease

## 2016-03-30 ENCOUNTER — Encounter: Payer: Self-pay | Admitting: Internal Medicine

## 2016-04-02 NOTE — Addendum Note (Signed)
Addended by: Hulan Fray on: 04/02/2016 03:50 PM   Modules accepted: Orders

## 2016-05-04 ENCOUNTER — Encounter: Payer: Self-pay | Admitting: Internal Medicine

## 2016-05-17 ENCOUNTER — Other Ambulatory Visit: Payer: Self-pay | Admitting: Internal Medicine

## 2016-05-17 DIAGNOSIS — E118 Type 2 diabetes mellitus with unspecified complications: Principal | ICD-10-CM

## 2016-05-17 DIAGNOSIS — E1165 Type 2 diabetes mellitus with hyperglycemia: Secondary | ICD-10-CM

## 2016-05-17 DIAGNOSIS — IMO0002 Reserved for concepts with insufficient information to code with codable children: Secondary | ICD-10-CM

## 2016-05-18 ENCOUNTER — Encounter: Payer: Self-pay | Admitting: Internal Medicine

## 2016-05-18 ENCOUNTER — Ambulatory Visit (INDEPENDENT_AMBULATORY_CARE_PROVIDER_SITE_OTHER): Payer: Self-pay | Admitting: Internal Medicine

## 2016-05-18 ENCOUNTER — Encounter (INDEPENDENT_AMBULATORY_CARE_PROVIDER_SITE_OTHER): Payer: Self-pay

## 2016-05-18 VITALS — Ht 68.0 in | Wt 261.8 lb

## 2016-05-18 DIAGNOSIS — Z7984 Long term (current) use of oral hypoglycemic drugs: Secondary | ICD-10-CM

## 2016-05-18 DIAGNOSIS — Z1211 Encounter for screening for malignant neoplasm of colon: Secondary | ICD-10-CM

## 2016-05-18 DIAGNOSIS — E119 Type 2 diabetes mellitus without complications: Secondary | ICD-10-CM

## 2016-05-18 DIAGNOSIS — E118 Type 2 diabetes mellitus with unspecified complications: Principal | ICD-10-CM

## 2016-05-18 DIAGNOSIS — E1165 Type 2 diabetes mellitus with hyperglycemia: Secondary | ICD-10-CM

## 2016-05-18 DIAGNOSIS — IMO0002 Reserved for concepts with insufficient information to code with codable children: Secondary | ICD-10-CM

## 2016-05-18 LAB — GLUCOSE, CAPILLARY: Glucose-Capillary: 107 mg/dL — ABNORMAL HIGH (ref 65–99)

## 2016-05-18 LAB — POCT GLYCOSYLATED HEMOGLOBIN (HGB A1C): Hemoglobin A1C: 8.4

## 2016-05-18 MED ORDER — METFORMIN HCL 500 MG PO TABS: 500.0000 mg | ORAL_TABLET | Freq: Two times a day (BID) | ORAL | 5 refills | Status: DC

## 2016-05-18 NOTE — Patient Instructions (Signed)
Please take metformin 500mg  twice daily.   We will work on the colonoscopy.  Let's see each other back in November.

## 2016-05-18 NOTE — Progress Notes (Signed)
   CC: Diabetes  HPI:  [name] is a 50 y.o. male who presents today for diabetes. Please see assessment & plan for status of chronic medical problems.   Past Medical History:  Diagnosis Date  . HIV (human immunodeficiency virus infection) ([place]) 2009   11/20/2012 Last CD4 count 210 and VL <20  . Hypertension 2009   Well controlled on HCTZ  . Type 2 diabetes mellitus (HCC)    Well controlled on metformin    Review of Systems:  Please see each problem below for a pertinent review of systems.  Physical Exam:  Vitals:   05/18/16 1549  SpO2: 99%  Weight: 261 lb 12.8 oz (118.8 kg)  Height: 5\' 8"  (1.727 m)   Physical Exam  Constitutional: He is oriented to person, place, and time. No distress.  HENT:  Head: Normocephalic and atraumatic.  Eyes: Conjunctivae are normal. No scleral icterus.  Cardiovascular: Normal rate and regular rhythm.   Pulmonary/Chest: Effort normal. No respiratory distress.  Neurological: He is alert and oriented to person, place, and time.  Skin: Skin is warm and dry. He is not diaphoretic.      Assessment & Plan:   See Encounters Tab for problem based charting.  Patient discussed with Dr. Evette Doffing

## 2016-05-20 NOTE — Assessment & Plan Note (Addendum)
Assessment He is interested in undergoing a screening colonoscopy for colon cancer. He does not report symptoms suggestive of occult malignancy, like decreased energy, poor appetite, change in his bowel movements.  Plan -Refer to GI for screening colonoscopy  ADDENDUM 05/23/2016  5:34 PM:  Due to his coverage, he is unable to be referred for screening colonoscopy at this time. I spoke with him by phone and conveyed to him the importance of following instructions with regards to the FOBT cards. Should they results abnormal, we may be able to refer him for a colonoscopy to which he expressed understanding.

## 2016-05-20 NOTE — Assessment & Plan Note (Addendum)
Assessment A1c today is improved to 8.4, down from 13.4 back in April 2017. He attributes his success to cutting back on his sugary drink consumption, notably soda. He does still indulge in ginger ale though not every single day. He is also increased his water intake. He acknowledged his adherence to metformin 1000 mg twice daily though later disclosed to me he is unable to take 4 tablets of his 500 mg medication for at least 3 days in the past week. He has an upcoming ophthalmology appointment for his annual retinopathy screening. He does not report symptoms of hyperglycemia, like polyuria, polydipsia, weight loss.  The improvement in his glycemic control is optimistic, I do worry that he is at risk for deteriorating again. Given his age and comorbidities, I think it is reasonable to aim for a glycemic target of A1c 7%. Cost may be a limiting factor as he is without insurance.  Plan -Decreased metformin back to 500 mg twice daily after speaking with his ID doctor, Dr. Tommy Medal, given potential toxicity resulting from one of the study drugs, dolutegravir, which he is currently taking -Recommended we augment his therapy at 3 month follow-up should his glycemic control not improve with an SGLT2 inhibitor to which he is agreeable

## 2016-05-22 NOTE — Progress Notes (Signed)
Internal Medicine Clinic Attending  Case discussed with Dr. Patel,Rushil soon after the resident saw the patient.  We reviewed the resident's history and exam and pertinent patient test results.  I agree with the assessment, diagnosis, and plan of care documented in the resident's note. 

## 2016-06-13 ENCOUNTER — Telehealth: Payer: Self-pay

## 2016-06-13 NOTE — Telephone Encounter (Signed)
Patient needs referral to [facility] for routine eye exam. He has the orange card and need referral.   Laverle Patter, RN

## 2016-06-13 NOTE — Telephone Encounter (Signed)
Duplicate

## 2016-06-14 ENCOUNTER — Ambulatory Visit: Payer: Self-pay

## 2016-06-15 ENCOUNTER — Encounter: Payer: Self-pay | Admitting: Infectious Disease

## 2016-07-07 ENCOUNTER — Other Ambulatory Visit: Payer: Self-pay | Admitting: Infectious Disease

## 2016-07-07 DIAGNOSIS — E785 Hyperlipidemia, unspecified: Secondary | ICD-10-CM

## 2016-07-26 ENCOUNTER — Encounter (INDEPENDENT_AMBULATORY_CARE_PROVIDER_SITE_OTHER): Payer: Self-pay | Admitting: *Deleted

## 2016-07-26 VITALS — BP 121/84 | HR 75 | Temp 97.7°F | Resp 16 | Wt 260.0 lb

## 2016-07-26 DIAGNOSIS — Z23 Encounter for immunization: Secondary | ICD-10-CM

## 2016-07-26 DIAGNOSIS — Z006 Encounter for examination for normal comparison and control in clinical research program: Secondary | ICD-10-CM

## 2016-07-26 LAB — HEMOGLOBIN A1C
HEMOGLOBIN A1C: 8 % — AB (ref <5.7)
MEAN PLASMA GLUCOSE: 183 mg/dL

## 2016-07-26 NOTE — Progress Notes (Signed)
Wayne Wolfe is here for week 192 visit A22, HAILO Study: A Long Term follow-up of Older HIV-Infected Adults in the ACTG, an observational study addressing the issues of aging, HIV infection and Inflammation. States that he has notice new peripheral neuropathy symptoms over the past 2-3 months, including occasional burning, aching, pain, pins & needle sensation in bilateral feet. Has an appointment with primary MD next month. Instructed him to discuss these new s/sx's with his provider at that time. Agreed. Influenza vaccine given (R) deltoid. Site unremarkable. Next visit scheduled for 01/09/17 @ 10:00am.

## 2016-07-27 LAB — HEPATITIS C ANTIBODY: HCV AB: NEGATIVE

## 2016-07-27 LAB — COMPREHENSIVE METABOLIC PANEL
ALBUMIN: 4 g/dL (ref 3.6–5.1)
ALT: 17 U/L (ref 9–46)
AST: 17 U/L (ref 10–35)
Alkaline Phosphatase: 49 U/L (ref 40–115)
BUN: 15 mg/dL (ref 7–25)
CHLORIDE: 102 mmol/L (ref 98–110)
CO2: 29 mmol/L (ref 20–31)
Calcium: 9.3 mg/dL (ref 8.6–10.3)
Creat: 1.2 mg/dL (ref 0.70–1.33)
Glucose, Bld: 125 mg/dL — ABNORMAL HIGH (ref 65–99)
POTASSIUM: 4.2 mmol/L (ref 3.5–5.3)
Sodium: 140 mmol/L (ref 135–146)
TOTAL PROTEIN: 7 g/dL (ref 6.1–8.1)
Total Bilirubin: 0.5 mg/dL (ref 0.2–1.2)

## 2016-07-27 LAB — LIPID PANEL
Cholesterol: 140 mg/dL (ref 125–200)
HDL: 31 mg/dL — AB (ref >=40)
LDL Cholesterol: 86 mg/dL (ref <130)
TRIGLYCERIDES: 114 mg/dL (ref <150)
Total CHOL/HDL Ratio: 4.5 Ratio (ref <=5.0)
VLDL: 23 mg/dL (ref <30)

## 2016-07-27 LAB — PROTEIN, URINE, RANDOM: TOTAL PROTEIN, URINE: 27 mg/dL — AB (ref 5–25)

## 2016-07-27 LAB — CREATININE, URINE, RANDOM: CREATININE, URINE: 228 mg/dL (ref 20–370)

## 2016-08-02 ENCOUNTER — Encounter: Payer: Self-pay | Admitting: Infectious Disease

## 2016-08-02 LAB — CD4/CD8 (T-HELPER/T-SUPPRESSOR CELL)
CD4%: 19.3
CD4: 444
CD8 T CELL SUPPRESSOR: 56.5
CD8: 1300

## 2016-08-09 ENCOUNTER — Encounter (INDEPENDENT_AMBULATORY_CARE_PROVIDER_SITE_OTHER): Payer: Self-pay | Admitting: *Deleted

## 2016-08-09 VITALS — BP 121/83 | HR 78 | Temp 98.2°F | Wt 258.0 lb

## 2016-08-09 DIAGNOSIS — Z006 Encounter for examination for normal comparison and control in clinical research program: Secondary | ICD-10-CM

## 2016-08-09 LAB — COMPREHENSIVE METABOLIC PANEL
ALT: 17 U/L (ref 9–46)
AST: 5 U/L — ABNORMAL LOW (ref 10–35)
Albumin: 4.3 g/dL (ref 3.6–5.1)
Alkaline Phosphatase: 47 U/L (ref 40–115)
BUN: 9 mg/dL (ref 7–25)
CHLORIDE: 98 mmol/L (ref 98–110)
CO2: 26 mmol/L (ref 20–31)
Calcium: 9.5 mg/dL (ref 8.6–10.3)
Creat: 1.24 mg/dL (ref 0.70–1.33)
Glucose, Bld: 131 mg/dL — ABNORMAL HIGH (ref 65–99)
POTASSIUM: 4 mmol/L (ref 3.5–5.3)
Sodium: 137 mmol/L (ref 135–146)
TOTAL PROTEIN: 7.3 g/dL (ref 6.1–8.1)
Total Bilirubin: 0.6 mg/dL (ref 0.2–1.2)

## 2016-08-09 LAB — CD4/CD8 (T-HELPER/T-SUPPRESSOR CELL)
CD4%: 17.6
CD4: 370
CD8 T CELL SUPPRESSOR: 57.3
CD8: 1203

## 2016-08-09 LAB — PHOSPHORUS: PHOSPHORUS: 3.4 mg/dL (ref 2.5–4.5)

## 2016-08-09 LAB — HIV-1 RNA QUANT-NO REFLEX-BLD

## 2016-08-09 NOTE — Progress Notes (Signed)
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  He denies any new problems or concerns. He says he feels like his memory may be a little better than when he started.  He did all the neurocognitive testing again at this visit. He is to return in February to pick up his meds for this study and then again in April for the Reprieve and week 96 for A5324.

## 2016-08-17 ENCOUNTER — Other Ambulatory Visit: Payer: Self-pay | Admitting: Infectious Disease

## 2016-08-22 ENCOUNTER — Encounter: Payer: Self-pay | Admitting: Infectious Disease

## 2016-08-28 ENCOUNTER — Other Ambulatory Visit: Payer: Self-pay | Admitting: Internal Medicine

## 2016-08-28 NOTE — Telephone Encounter (Signed)
He was to see me back this month but appears he did not.  In his past visit, I had mentioned to him if he would be interested in switching to lisinopril. This medication has renal protective effects for people who have diabetes down the road.  The most common side effect is a dry cough. Uncommonly, people do have allergic reactions which include throat and/or tongue swelling.   Can we ask him if he is interested in switching? If so, I'd recommend he come in for blood work next month and have his blood pressure rechecked.   Thank you.

## 2016-08-29 NOTE — Telephone Encounter (Signed)
Actually, I did some reading last night. I think it is alright to stick with the current regimen. I will go ahead and refill it. If he develops proteinuria, we will need to consider adding another agent, like lisinopril.

## 2016-08-30 ENCOUNTER — Encounter: Payer: Self-pay | Admitting: Infectious Disease

## 2016-09-01 ENCOUNTER — Other Ambulatory Visit: Payer: Self-pay | Admitting: Infectious Disease

## 2016-09-01 DIAGNOSIS — E785 Hyperlipidemia, unspecified: Secondary | ICD-10-CM

## 2016-09-07 ENCOUNTER — Other Ambulatory Visit: Payer: Self-pay | Admitting: Infectious Disease

## 2016-09-16 ENCOUNTER — Other Ambulatory Visit: Payer: Self-pay | Admitting: Infectious Disease

## 2016-09-20 ENCOUNTER — Other Ambulatory Visit: Payer: Self-pay

## 2016-09-21 ENCOUNTER — Encounter: Payer: Self-pay | Admitting: Internal Medicine

## 2016-10-04 ENCOUNTER — Ambulatory Visit (INDEPENDENT_AMBULATORY_CARE_PROVIDER_SITE_OTHER): Payer: Self-pay | Admitting: Infectious Disease

## 2016-10-04 ENCOUNTER — Encounter: Payer: Self-pay | Admitting: Infectious Disease

## 2016-10-04 VITALS — BP 143/82 | HR 79 | Temp 97.9°F | Ht 66.0 in | Wt 264.0 lb

## 2016-10-04 DIAGNOSIS — E1169 Type 2 diabetes mellitus with other specified complication: Secondary | ICD-10-CM

## 2016-10-04 DIAGNOSIS — I10 Essential (primary) hypertension: Secondary | ICD-10-CM

## 2016-10-04 DIAGNOSIS — Z23 Encounter for immunization: Secondary | ICD-10-CM

## 2016-10-04 DIAGNOSIS — I152 Hypertension secondary to endocrine disorders: Secondary | ICD-10-CM

## 2016-10-04 DIAGNOSIS — E1165 Type 2 diabetes mellitus with hyperglycemia: Secondary | ICD-10-CM

## 2016-10-04 DIAGNOSIS — B20 Human immunodeficiency virus [HIV] disease: Secondary | ICD-10-CM

## 2016-10-04 DIAGNOSIS — E785 Hyperlipidemia, unspecified: Secondary | ICD-10-CM

## 2016-10-04 DIAGNOSIS — G969 Disorder of central nervous system, unspecified: Secondary | ICD-10-CM

## 2016-10-04 DIAGNOSIS — E1159 Type 2 diabetes mellitus with other circulatory complications: Secondary | ICD-10-CM

## 2016-10-04 DIAGNOSIS — IMO0002 Reserved for concepts with insufficient information to code with codable children: Secondary | ICD-10-CM

## 2016-10-04 DIAGNOSIS — E118 Type 2 diabetes mellitus with unspecified complications: Secondary | ICD-10-CM

## 2016-10-04 HISTORY — DX: Human immunodeficiency virus (HIV) disease: B20

## 2016-10-04 NOTE — Patient Instructions (Signed)
Meningococcal vaccine today  Then in 8  Weeks  Make sure to renew ADAP this month and then in July
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
## 2016-10-11 ENCOUNTER — Ambulatory Visit: Payer: Self-pay

## 2016-10-22 ENCOUNTER — Other Ambulatory Visit: Payer: Self-pay | Admitting: Infectious Disease

## 2016-10-26 ENCOUNTER — Encounter: Payer: Self-pay | Admitting: Infectious Disease

## 2016-10-29 ENCOUNTER — Other Ambulatory Visit: Payer: Self-pay

## 2016-10-29 DIAGNOSIS — E118 Type 2 diabetes mellitus with unspecified complications: Principal | ICD-10-CM

## 2016-10-29 DIAGNOSIS — IMO0002 Reserved for concepts with insufficient information to code with codable children: Secondary | ICD-10-CM

## 2016-10-29 DIAGNOSIS — E1165 Type 2 diabetes mellitus with hyperglycemia: Secondary | ICD-10-CM

## 2016-10-29 MED ORDER — METFORMIN HCL 500 MG PO TABS: 500.0000 mg | ORAL_TABLET | Freq: Two times a day (BID) | ORAL | 5 refills | Status: DC

## 2016-10-29 NOTE — Telephone Encounter (Signed)
Pt called in requesting refill for metformin and also to make appointment for his second meningitis vaccine. Refill sent in and appointment was made.

## 2016-10-30 ENCOUNTER — Other Ambulatory Visit: Payer: Self-pay | Admitting: Infectious Disease

## 2016-10-30 DIAGNOSIS — E1169 Type 2 diabetes mellitus with other specified complication: Secondary | ICD-10-CM

## 2016-10-31 ENCOUNTER — Other Ambulatory Visit: Payer: Self-pay | Admitting: Infectious Disease

## 2016-10-31 ENCOUNTER — Telehealth: Payer: Self-pay

## 2016-10-31 ENCOUNTER — Other Ambulatory Visit: Payer: Self-pay

## 2016-10-31 DIAGNOSIS — E1165 Type 2 diabetes mellitus with hyperglycemia: Secondary | ICD-10-CM

## 2016-10-31 DIAGNOSIS — E118 Type 2 diabetes mellitus with unspecified complications: Principal | ICD-10-CM

## 2016-10-31 DIAGNOSIS — IMO0002 Reserved for concepts with insufficient information to code with codable children: Secondary | ICD-10-CM

## 2016-10-31 MED ORDER — METFORMIN HCL 500 MG PO TABS: 500.0000 mg | ORAL_TABLET | Freq: Two times a day (BID) | ORAL | 5 refills | Status: DC

## 2016-10-31 NOTE — Addendum Note (Signed)
Addended by: Truddie Crumble on: 10/31/2016 04:03 PM   Modules accepted: Orders

## 2016-10-31 NOTE — Progress Notes (Signed)
Per [place], Mr Wayne Wolfe requested a new set of take home Hemoccult cards. Cards mailed to patient 10-31-16 Next office visit 11-23-16 with PCP  Maryan Rued, PBT 10-31-16 [id number]

## 2016-10-31 NOTE — Telephone Encounter (Signed)
Left message on voicemail stating  he spoke with someone regarding his metformin refill and was told it would be called to the pharmacy.   Medication is not at the pharmacy.  Records indicated medication was sent on 10-30-15-18. I will resend medications.  Laverle Patter, RN

## 2016-11-01 ENCOUNTER — Other Ambulatory Visit: Payer: Self-pay | Admitting: Internal Medicine

## 2016-11-01 ENCOUNTER — Encounter (INDEPENDENT_AMBULATORY_CARE_PROVIDER_SITE_OTHER): Payer: Self-pay | Admitting: *Deleted

## 2016-11-01 DIAGNOSIS — Z006 Encounter for examination for normal comparison and control in clinical research program: Secondary | ICD-10-CM

## 2016-11-01 NOTE — Telephone Encounter (Signed)
Dr Drucilla Schmidt did this script yesterday, called pt and informed him, ask him to call dr vandam's office if there is a problem, he was agreeable

## 2016-11-01 NOTE — Telephone Encounter (Signed)
Pharmacy sent Rx back stating last filled by Dr. Posey Pronto. Called patient and he will request this medication from his PCP. Myrtis Hopping

## 2016-11-01 NOTE — Telephone Encounter (Signed)
Refill Request   metFORMIN (GLUCOPHAGE) 500 MG tablet

## 2016-11-02 ENCOUNTER — Other Ambulatory Visit: Payer: Self-pay | Admitting: *Deleted

## 2016-11-02 ENCOUNTER — Other Ambulatory Visit: Payer: Self-pay

## 2016-11-02 ENCOUNTER — Other Ambulatory Visit: Payer: Self-pay | Admitting: Infectious Disease

## 2016-11-02 DIAGNOSIS — E118 Type 2 diabetes mellitus with unspecified complications: Principal | ICD-10-CM

## 2016-11-02 DIAGNOSIS — IMO0002 Reserved for concepts with insufficient information to code with codable children: Secondary | ICD-10-CM

## 2016-11-02 DIAGNOSIS — E1165 Type 2 diabetes mellitus with hyperglycemia: Secondary | ICD-10-CM

## 2016-11-02 MED ORDER — METFORMIN HCL 500 MG PO TABS: 500.0000 mg | ORAL_TABLET | Freq: Two times a day (BID) | ORAL | 3 refills | Status: DC

## 2016-11-02 NOTE — Telephone Encounter (Signed)
Medication called to pharmacy .   Laverle Patter, RN   I am not sure what happened with initial authorization.   Laverle Patter, RN

## 2016-11-02 NOTE — Telephone Encounter (Signed)
As this patient was seen by me and deemed necessary for their treatment, I will refill this prescription for metformin.

## 2016-11-02 NOTE — Telephone Encounter (Signed)
Called pharm, they state dr vandam cancelled the script, if he did he did not take it out on pt medlist, I have sent a new request to dr patel, marked urgent

## 2016-11-02 NOTE — Telephone Encounter (Signed)
I will call [place] .

## 2016-11-02 NOTE — Telephone Encounter (Signed)
metFORMIN (GLUCOPHAGE) 500 MG tablet. Refill request.

## 2016-11-02 NOTE — Telephone Encounter (Signed)
Ok for refill per Dr Tommy Medal.

## 2016-11-02 NOTE — Progress Notes (Signed)
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 No new complaints or concerns verbalized. Next visit scheduled for 4/19 @ 10:00am

## 2016-11-23 ENCOUNTER — Encounter: Payer: Self-pay | Admitting: Internal Medicine

## 2016-11-23 ENCOUNTER — Encounter: Payer: Self-pay | Admitting: Dietician

## 2016-11-29 ENCOUNTER — Ambulatory Visit (INDEPENDENT_AMBULATORY_CARE_PROVIDER_SITE_OTHER): Payer: Self-pay | Admitting: *Deleted

## 2016-11-29 DIAGNOSIS — B20 Human immunodeficiency virus [HIV] disease: Secondary | ICD-10-CM

## 2016-11-29 DIAGNOSIS — Z23 Encounter for immunization: Secondary | ICD-10-CM

## 2017-01-15 ENCOUNTER — Other Ambulatory Visit: Payer: Self-pay | Admitting: Infectious Disease

## 2017-01-15 ENCOUNTER — Encounter (INDEPENDENT_AMBULATORY_CARE_PROVIDER_SITE_OTHER): Payer: Self-pay | Admitting: *Deleted

## 2017-01-15 VITALS — BP 137/86 | HR 81 | Temp 98.2°F | Resp 16 | Wt 258.0 lb

## 2017-01-15 DIAGNOSIS — Z006 Encounter for examination for normal comparison and control in clinical research program: Secondary | ICD-10-CM

## 2017-01-15 LAB — COMPREHENSIVE METABOLIC PANEL
ALT: 20 U/L (ref 9–46)
AST: 13 U/L (ref 10–35)
Albumin: 4 g/dL (ref 3.6–5.1)
Alkaline Phosphatase: 65 U/L (ref 40–115)
BILIRUBIN TOTAL: 0.5 mg/dL (ref 0.2–1.2)
BUN: 10 mg/dL (ref 7–25)
CO2: 31 mmol/L (ref 20–31)
CREATININE: 1.22 mg/dL (ref 0.70–1.33)
Calcium: 9.7 mg/dL (ref 8.6–10.3)
Chloride: 99 mmol/L (ref 98–110)
GLUCOSE: 253 mg/dL — AB (ref 65–99)
Potassium: 4.2 mmol/L (ref 3.5–5.3)
SODIUM: 140 mmol/L (ref 135–146)
Total Protein: 7 g/dL (ref 6.1–8.1)

## 2017-01-15 LAB — PHOSPHORUS: Phosphorus: 4 mg/dL (ref 2.5–4.5)

## 2017-01-15 NOTE — Progress Notes (Signed)
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  He denies any new problems or concerns. He says he still has some memory lapses at times but is not sure if that is due to age or otherwise. He says it is better though. He has been very adherent with his study meds and will return in October for the next visits.  He will be seeing Dr. Tommy Medal in June and the [facility] resident in July.

## 2017-01-16 ENCOUNTER — Telehealth: Payer: Self-pay | Admitting: *Deleted

## 2017-01-16 NOTE — Telephone Encounter (Signed)
Price's fasting glucose was 253 yesterday. I discussed it with Dr. Tommy Medal and he does not believe it is related to study drug and wants him to go see the Diabetes Treatment Coordinator at IM or get an appt. with one of the residents sooner than he already has. Emric did say he had had a milkshake and a cookie at around 12 mn the night before. The sample was drawn at 09:20. I called [place] and instructed him to get in to see the DTC asap.

## 2017-01-30 ENCOUNTER — Encounter: Payer: Self-pay | Admitting: *Deleted

## 2017-01-30 LAB — CD4/CD8 (T-HELPER/T-SUPPRESSOR CELL)
CD4 Count: 452
CD4 T CELL HELPER: 22.6
CD8 T CELL SUPPRESSOR: 56.1
CD8 T Cell Abs: 1122

## 2017-01-30 LAB — HIV-1 RNA QUANT-NO REFLEX-BLD: HIV-1 RNA Viral Load: <40

## 2017-02-22 ENCOUNTER — Other Ambulatory Visit: Payer: Self-pay | Admitting: Internal Medicine

## 2017-02-26 ENCOUNTER — Other Ambulatory Visit: Payer: Self-pay | Admitting: Internal Medicine

## 2017-02-26 NOTE — Telephone Encounter (Signed)
I will refill for a 30-day supply assuming he will follow-up with me on 6/25. He has some proteinuria on past lab work, and I would like to recommend ACE/ARB therapy at this visit.

## 2017-03-04 ENCOUNTER — Encounter: Payer: Self-pay | Admitting: *Deleted

## 2017-03-12 ENCOUNTER — Other Ambulatory Visit: Payer: Self-pay | Admitting: Infectious Disease

## 2017-03-12 DIAGNOSIS — E785 Hyperlipidemia, unspecified: Secondary | ICD-10-CM

## 2017-03-13 ENCOUNTER — Ambulatory Visit (INDEPENDENT_AMBULATORY_CARE_PROVIDER_SITE_OTHER): Payer: Self-pay | Admitting: Infectious Disease

## 2017-03-13 ENCOUNTER — Telehealth: Payer: Self-pay | Admitting: Infectious Disease

## 2017-03-13 ENCOUNTER — Encounter: Payer: Self-pay | Admitting: Infectious Disease

## 2017-03-13 VITALS — BP 135/87 | HR 81 | Temp 98.8°F | Ht 67.0 in | Wt 246.0 lb

## 2017-03-13 DIAGNOSIS — B20 Human immunodeficiency virus [HIV] disease: Secondary | ICD-10-CM

## 2017-03-13 DIAGNOSIS — R358 Other polyuria: Secondary | ICD-10-CM

## 2017-03-13 DIAGNOSIS — H6503 Acute serous otitis media, bilateral: Secondary | ICD-10-CM

## 2017-03-13 DIAGNOSIS — R3589 Other polyuria: Secondary | ICD-10-CM | POA: Insufficient documentation

## 2017-03-13 DIAGNOSIS — E1165 Type 2 diabetes mellitus with hyperglycemia: Secondary | ICD-10-CM

## 2017-03-13 DIAGNOSIS — H9203 Otalgia, bilateral: Secondary | ICD-10-CM

## 2017-03-13 DIAGNOSIS — H6690 Otitis media, unspecified, unspecified ear: Secondary | ICD-10-CM | POA: Insufficient documentation

## 2017-03-13 DIAGNOSIS — R634 Abnormal weight loss: Secondary | ICD-10-CM

## 2017-03-13 DIAGNOSIS — N50819 Testicular pain, unspecified: Secondary | ICD-10-CM

## 2017-03-13 DIAGNOSIS — R42 Dizziness and giddiness: Secondary | ICD-10-CM | POA: Insufficient documentation

## 2017-03-13 HISTORY — DX: Dizziness and giddiness: R42

## 2017-03-13 HISTORY — DX: Type 2 diabetes mellitus with hyperglycemia: E11.65

## 2017-03-13 HISTORY — DX: Abnormal weight loss: R63.4

## 2017-03-13 HISTORY — DX: Other polyuria: R35.89

## 2017-03-13 HISTORY — DX: Otitis media, unspecified, unspecified ear: H66.90

## 2017-03-13 MED ORDER — AMOXICILLIN 500 MG PO CAPS: 500.0000 mg | ORAL_CAPSULE | Freq: Three times a day (TID) | ORAL | 1 refills | Status: DC

## 2017-03-13 NOTE — Telephone Encounter (Signed)
Test
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
## 2017-03-13 NOTE — Patient Instructions (Signed)
STOP taking your HCTZ for now  SEE IM ASAP. I think your metformin needs to likely be increased  We will send in rx for amoxicillin for possible ear infection  Make appt to renew [place] In July

## 2017-03-13 NOTE — Telephone Encounter (Signed)
Works! Thanks!

## 2017-03-14 LAB — COMPLETE METABOLIC PANEL WITH GFR
ALT: 21 U/L (ref 9–46)
AST: 14 U/L (ref 10–35)
Albumin: 4.1 g/dL (ref 3.6–5.1)
Alkaline Phosphatase: 65 U/L (ref 40–115)
BUN: 11 mg/dL (ref 7–25)
CHLORIDE: 97 mmol/L — AB (ref 98–110)
CO2: 25 mmol/L (ref 20–31)
Calcium: 9.1 mg/dL (ref 8.6–10.3)
Creat: 1.25 mg/dL (ref 0.70–1.33)
GFR, EST AFRICAN AMERICAN: 76 mL/min (ref >=60)
GFR, EST NON AFRICAN AMERICAN: 66 mL/min (ref >=60)
Glucose, Bld: 288 mg/dL — ABNORMAL HIGH (ref 65–99)
POTASSIUM: 4.1 mmol/L (ref 3.5–5.3)
Sodium: 137 mmol/L (ref 135–146)
Total Bilirubin: 0.7 mg/dL (ref 0.2–1.2)
Total Protein: 6.7 g/dL (ref 6.1–8.1)

## 2017-03-14 LAB — HEMOGLOBIN A1C
Hgb A1c MFr Bld: 12.9 % — ABNORMAL HIGH (ref <5.7)
MEAN PLASMA GLUCOSE: 324 mg/dL

## 2017-03-14 LAB — URINALYSIS, ROUTINE W REFLEX MICROSCOPIC
BILIRUBIN URINE: NEGATIVE
HGB URINE DIPSTICK: NEGATIVE
Ketones, ur: NEGATIVE
LEUKOCYTES UA: NEGATIVE
Nitrite: NEGATIVE
Specific Gravity, Urine: 1.031 (ref 1.001–1.035)
pH: 5.5 (ref 5.0–8.0)

## 2017-03-14 LAB — URINE CYTOLOGY ANCILLARY ONLY
CHLAMYDIA, DNA PROBE: NEGATIVE
Neisseria Gonorrhea: NEGATIVE

## 2017-03-14 LAB — URINE CULTURE: Organism ID, Bacteria: NO GROWTH

## 2017-03-14 LAB — CYTOLOGY, (ORAL, ANAL, URETHRAL) ANCILLARY ONLY
Chlamydia: NEGATIVE
Chlamydia: NEGATIVE
NEISSERIA GONORRHEA: NEGATIVE
Neisseria Gonorrhea: NEGATIVE

## 2017-03-14 LAB — RPR

## 2017-03-14 LAB — URINALYSIS, MICROSCOPIC ONLY
Bacteria, UA: NONE SEEN [HPF]
CASTS: NONE SEEN [LPF]
CRYSTALS: NONE SEEN [HPF]
RBC / HPF: NONE SEEN RBC/HPF (ref <=2)
Squamous Epithelial / LPF: NONE SEEN [HPF] (ref <=5)
Yeast: NONE SEEN [HPF]

## 2017-03-14 LAB — ALLERGEN, BOTRYTIS CINEREA, M7

## 2017-03-15 ENCOUNTER — Telehealth: Payer: Self-pay | Admitting: *Deleted

## 2017-03-15 NOTE — Telephone Encounter (Signed)
Patient notified and he already has an appointment with his PCP next Wednesday 03/20/17. Myrtis Hopping

## 2017-03-15 NOTE — Telephone Encounter (Signed)
-----   Message from Truman Hayward, MD sent at 03/14/2017  1:42 PM EDT ----- Can we ask Shanon Brow to up his metfomin to 1 gram BID and he needs to see IM ASAP. I would l ike him seen within a week so that he can start on insulin if he needs it

## 2017-03-17 NOTE — Telephone Encounter (Signed)
Very good 

## 2017-03-20 ENCOUNTER — Ambulatory Visit (INDEPENDENT_AMBULATORY_CARE_PROVIDER_SITE_OTHER): Payer: Self-pay | Admitting: Internal Medicine

## 2017-03-20 ENCOUNTER — Encounter: Payer: Self-pay | Admitting: Internal Medicine

## 2017-03-20 ENCOUNTER — Ambulatory Visit: Payer: Self-pay

## 2017-03-20 VITALS — BP 120/72 | HR 78 | Temp 98.4°F | Ht 67.0 in | Wt 253.2 lb

## 2017-03-20 DIAGNOSIS — Z9114 Patient's other noncompliance with medication regimen: Secondary | ICD-10-CM

## 2017-03-20 DIAGNOSIS — R0789 Other chest pain: Secondary | ICD-10-CM

## 2017-03-20 DIAGNOSIS — E118 Type 2 diabetes mellitus with unspecified complications: Secondary | ICD-10-CM

## 2017-03-20 DIAGNOSIS — Z8249 Family history of ischemic heart disease and other diseases of the circulatory system: Secondary | ICD-10-CM

## 2017-03-20 DIAGNOSIS — Z7984 Long term (current) use of oral hypoglycemic drugs: Secondary | ICD-10-CM

## 2017-03-20 DIAGNOSIS — E1165 Type 2 diabetes mellitus with hyperglycemia: Secondary | ICD-10-CM

## 2017-03-20 DIAGNOSIS — IMO0002 Reserved for concepts with insufficient information to code with codable children: Secondary | ICD-10-CM

## 2017-03-20 HISTORY — DX: Other chest pain: R07.89

## 2017-03-20 LAB — GLUCOSE, CAPILLARY: GLUCOSE-CAPILLARY: 161 mg/dL — AB (ref 65–99)

## 2017-03-20 MED ORDER — METFORMIN HCL ER 500 MG PO TB24: ORAL_TABLET | ORAL | 3 refills | Status: DC

## 2017-03-20 NOTE — Patient Instructions (Addendum)
For the diabetes, please take metformin XR 2 tablets with breakfast. After 2 weeks, take 2 tablets with breakfast and dinner. Skipping doses will upset your stomach.  Please come back in 1 month to check your urine after we get your orange card approved.  For the chest pain, I will talk to Dr. Tommy Medal about adjusting your cholesterol medication.   It was a pleasure working with you, and I wish you well.

## 2017-03-20 NOTE — Progress Notes (Signed)
   CC: diabetes  HPI:  [name] is a 51 y.o. who presents today for diabetes. Please see assessment & plan for status of chronic medical problems.   Past Medical History:  Diagnosis Date  . Dizziness 03/13/2017  . HIV (human immunodeficiency virus infection) ([place]) 2009   11/20/2012 Last CD4 count 210 and VL <20  . 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 Hypertension 2009   Well controlled on HCTZ  . Otitis media 03/13/2017  . Polyuria 03/13/2017  . Poorly controlled diabetes mellitus ([name]) 03/13/2017  . Type 2 diabetes mellitus (HCC)    Well controlled on metformin  . Weight loss 03/13/2017    Review of Systems:  Please see each problem below for a pertinent review of systems.  Physical Exam:  Vitals:   03/20/17 1320  BP: 120/72  Pulse: 78  Temp: 98.4 F (36.9 C)  TempSrc: Oral  SpO2: 98%  Weight: 253 lb 3.2 oz (114.9 kg)  Height: 5\' 7"  (1.702 m)   Physical Exam  Constitutional: He is oriented to person, place, and time. No distress.  HENT:  Head: Normocephalic and atraumatic.  Eyes: Conjunctivae are normal. No scleral icterus.  Cardiovascular: Normal rate and normal heart sounds.   Pulmonary/Chest: Effort normal. No respiratory distress.  Neurological: He is alert and oriented to person, place, and time.  Skin: He is not diaphoretic.   Assessment & Plan:   See Encounters Tab for problem based charting.  Patient discussed with Dr. Evette Doffing

## 2017-03-21 NOTE — Assessment & Plan Note (Addendum)
Assessment Over the last 2-3 weeks, he experienced intermittent sharp pain in different parts of his chest that last for seconds and do not vary with exertion, inspiration, or palpation. This pain is mainly felt while he sits or lays supine. He denies any associated radiation down his arm or to the back or diaphoresis. His father died in his 37s from a heart attack, and his mother had CHF.   His symptoms are consistent with atypical chest pain. Per ASCVD Risk Estimator Plus calculator, 10-year risk is 16.2% and 11% with optimal risk factors. Moderate intensity statin therapy is thus indicated, and he is taking 50% of it with pravastatin.  Plan -Increase pravastatin to 40 mg and will confirm with Dr. Tommy Medal it does not interfere with his study drug. -Offered return precautions and would consider EKG should his symptoms worsen or change in nature  ADDENDUM 03/25/2017  11:15 AM:  I spoke to him by phone, and he is agreeable to increasing pravastatin and coming in for an EKG. I will send in for a refill since he only has 16 tablets left.

## 2017-03-21 NOTE — Assessment & Plan Note (Signed)
Assessment At his last office visit 1 week ago, his A1c resulted 12.9, and he acknowledged non-adherence to metformin therapy. Today, he confirms taking 1000 mg twice daily though cannot tolerate the GI symptoms. Even when he was taking the medication consistently, he still experienced diarrhea though has never tried the XR formulation.   Given overall functional status and other co-morbidities, glycemic target of A1c 7 is appropriate.   Plan -Perform foot exam -Prescribed metformin XR 1000 mg twice daily with plan to follow-up in 1 weeks. If his symptoms are no better, we can consider sulfonylurea. -Perform urine microalbumin/creatinine ratio once he has orange card as he is not interested in paying the co-pay

## 2017-03-21 NOTE — Progress Notes (Signed)
Internal Medicine Clinic Attending  Case discussed with Dr. Patel,Rushil at the time of the visit.  We reviewed the resident's history and exam and pertinent patient test results.  I agree with the assessment, diagnosis, and plan of care documented in the resident's note.  

## 2017-03-23 ENCOUNTER — Other Ambulatory Visit: Payer: Self-pay | Admitting: Infectious Diseases

## 2017-03-23 DIAGNOSIS — B20 Human immunodeficiency virus [HIV] disease: Secondary | ICD-10-CM

## 2017-03-25 MED ORDER — PRAVASTATIN SODIUM 40 MG PO TABS: 40.0000 mg | ORAL_TABLET | Freq: Every day | ORAL | 3 refills | Status: DC

## 2017-03-25 NOTE — Addendum Note (Signed)
Addended by: Riccardo Dubin on: 03/25/2017 11:16 AM   Modules accepted: Orders

## 2017-03-26 ENCOUNTER — Ambulatory Visit: Payer: Self-pay

## 2017-03-29 ENCOUNTER — Encounter: Payer: Self-pay | Admitting: Internal Medicine

## 2017-04-16 ENCOUNTER — Telehealth: Payer: Self-pay | Admitting: *Deleted

## 2017-04-16 ENCOUNTER — Ambulatory Visit: Payer: Self-pay

## 2017-04-16 NOTE — Telephone Encounter (Signed)
Needing refill for HCTZ

## 2017-04-24 ENCOUNTER — Other Ambulatory Visit: Payer: Self-pay | Admitting: *Deleted

## 2017-04-24 MED ORDER — HYDROCHLOROTHIAZIDE 25 MG PO TABS: 25.0000 mg | ORAL_TABLET | Freq: Every day | ORAL | 6 refills | Status: DC

## 2017-04-25 ENCOUNTER — Encounter: Payer: Self-pay | Admitting: Infectious Disease

## 2017-06-22 ENCOUNTER — Other Ambulatory Visit: Payer: Self-pay | Admitting: Infectious Disease

## 2017-06-22 DIAGNOSIS — E785 Hyperlipidemia, unspecified: Secondary | ICD-10-CM

## 2017-07-17 ENCOUNTER — Encounter: Payer: Self-pay | Admitting: Infectious Disease

## 2017-07-17 ENCOUNTER — Ambulatory Visit (INDEPENDENT_AMBULATORY_CARE_PROVIDER_SITE_OTHER): Payer: Self-pay | Admitting: Infectious Disease

## 2017-07-17 VITALS — BP 135/86 | HR 84 | Temp 98.3°F | Wt 248.0 lb

## 2017-07-17 DIAGNOSIS — I10 Essential (primary) hypertension: Secondary | ICD-10-CM

## 2017-07-17 DIAGNOSIS — E785 Hyperlipidemia, unspecified: Secondary | ICD-10-CM

## 2017-07-17 DIAGNOSIS — Z23 Encounter for immunization: Secondary | ICD-10-CM

## 2017-07-17 DIAGNOSIS — E1159 Type 2 diabetes mellitus with other circulatory complications: Secondary | ICD-10-CM

## 2017-07-17 DIAGNOSIS — E1165 Type 2 diabetes mellitus with hyperglycemia: Secondary | ICD-10-CM

## 2017-07-17 DIAGNOSIS — E1169 Type 2 diabetes mellitus with other specified complication: Secondary | ICD-10-CM

## 2017-07-17 DIAGNOSIS — B20 Human immunodeficiency virus [HIV] disease: Secondary | ICD-10-CM

## 2017-07-17 DIAGNOSIS — I152 Hypertension secondary to endocrine disorders: Secondary | ICD-10-CM

## 2017-07-17 NOTE — Progress Notes (Signed)
Chief complaint: followup for HIV, also complaining polyuria weight loss  Subjective:    Patient ID: Wayne Wolfe, male    DOB: July 30, 1966, 51 y.o.   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  He has been doing perfectly with suppression of virus and healthy CD4.   Lab Results  Component Value Date   HIV1RNAQUANT <20 12/07/2013   HIV1RNAQUANT <20 07/06/2013   HIV1RNAQUANT <20 04/16/2013      Lab Results  Component Value Date   CD4TABS 370 08/09/2016   CD4TABS 444 07/26/2016   CD4TABS 394 02/16/2016    When I last saw Raymundo he was having trouble with polyuria and weight loss. His A1c was 12.9 and I encouraged him to be seen by primary care so that he can be placed on insulin. However this did not happen he is still taking metformin. I told him he needs to get back in with internal medicine as soon as possible and to receive help getting his diabetes under control. With this high A1c I'm highly skeptical that this can be managed with oral drugs.   Past Medical History:  Diagnosis Date  . Dizziness 03/13/2017  . HIV (human immunodeficiency virus infection) ([place]) 2009   11/20/2012 Last CD4 count 210 and VL <20  . 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 Hypertension 2009   Well controlled on HCTZ  . Otitis media 03/13/2017  . Polyuria 03/13/2017  . Poorly controlled diabetes mellitus ([place]) 03/13/2017  . Type 2 diabetes mellitus (HCC)    Well controlled on metformin  . Weight loss 03/13/2017    Past Surgical History:  Procedure Laterality Date  . CARPAL TUNNEL RELEASE Right 1990s  . none      Family History  Problem  Relation Age of Onset  . Diabetes Mellitus II Sister   . Diabetes Mellitus II Mother   . Coronary artery disease Mother 51       CABGx6  . Coronary artery disease Sister 51       CABG  . Diabetes Mellitus II Sister   . Coronary artery disease Sister   . Diabetes Mellitus II Sister       Social History   Social History  . Marital status: Single    Spouse name: N/A  . Number of children: N/A  . Years of education: 84   Occupational History  .  Unemployed   Social History Main Topics  . Smoking status: Former Research scientist (life sciences)  . Smokeless tobacco: Never Used  . Alcohol use No     Comment: Last alcohol 10 years ago, former moderate use, never binge.   . Drug use: No  . Sexual activity: No     Comment: accepted condoms   Other Topics Concern  . None   Social History Narrative   On disability. Lives in [place].     No Known Allergies   Current Outpatient Prescriptions:  .  Blood Glucose Monitoring Suppl (WAVESENSE AMP) w/Device KIT, Use to check blood glucose daily., Disp: 1 kit, Rfl: 0 .  dolutegravir (TIVICAY) 50 MG tablet, Take 1 tablet (50 mg total) by mouth daily. This is study provided, may be placebo. Do  not fill prescription., Disp: 30 tablet, Rfl: 11 .  hydrochlorothiazide (HYDRODIURIL) 25 MG tablet, Take 1 tablet (25 mg total) by mouth daily., Disp: 30 tablet, Rfl: 6 .  metFORMIN (GLUCOPHAGE-XR) 500 MG 24 hr tablet, Take 2 tablets with breakfast x 14 days. Take 2 tablets twice daily thereafter., Disp: 120 tablet, Rfl: 3 .  ODEFSEY 200-25-25 MG TABS tablet, TAKE 1 TABLET BY MOUTH DAILY WITH BREAKFAST, Disp: 30 tablet, Rfl: 4 .  pravastatin (PRAVACHOL) 20 MG tablet, TAKE 1 TABLET BY MOUTH EVERY DAY, Disp: 30 tablet, Rfl: 5 .  pravastatin (PRAVACHOL) 40 MG tablet, Take 1 tablet (40 mg total) by mouth daily., Disp: 90 tablet, Rfl: 3 .  maraviroc (SELZENTRY) 150 MG tablet, Take 1 tablet (150 mg total) by mouth 2 (two) times daily. Study provided, may be placebo. Do not fill  prescription. (Patient not taking: Reported on 07/17/2017), Disp: 60 tablet, Rfl: 11    Review of Systems  Constitutional: Positive for unexpected weight change. Negative for activity change, appetite change, chills, diaphoresis, fatigue and fever.  HENT: Negative for congestion, rhinorrhea, sinus pressure, sneezing, sore throat and trouble swallowing.   Eyes: Negative for photophobia and visual disturbance.  Respiratory: Negative for cough, chest tightness, shortness of breath, wheezing and stridor.   Cardiovascular: Negative for chest pain, palpitations and leg swelling.  Gastrointestinal: Negative for abdominal distention, abdominal pain, anal bleeding, blood in stool, constipation, diarrhea, nausea and vomiting.  Endocrine: Positive for polydipsia and polyuria.  Genitourinary: Negative for difficulty urinating, dysuria, flank pain and hematuria.  Musculoskeletal: Negative for arthralgias, back pain, gait problem, joint swelling and myalgias.  Skin: Negative for color change, pallor, rash and wound.  Neurological: Negative for tremors, weakness and light-headedness.  Hematological: Negative for adenopathy. Does not bruise/bleed easily.  Psychiatric/Behavioral: Negative for agitation, behavioral problems, confusion, dysphoric mood and sleep disturbance.       Objective:   Physical Exam  Constitutional: He is oriented to person, place, and time. He appears well-developed and well-nourished. No distress.  HENT:  Head: Normocephalic and atraumatic.  Right Ear: There is swelling. Tympanic membrane is erythematous. A middle ear effusion is present.  Left Ear: A middle ear effusion is present.  Mouth/Throat: Oropharynx is clear and moist. No oropharyngeal exudate.  Eyes: Pupils are equal, round, and reactive to light. Conjunctivae and EOM are normal. No scleral icterus.  Neck: Normal range of motion. Neck supple. No JVD present.  Cardiovascular: Normal heart sounds.   Pulmonary/Chest:  Effort normal and breath sounds normal. No respiratory distress. He has no wheezes. He has no rales. He exhibits no tenderness.  Abdominal: He exhibits no distension.  Musculoskeletal: He exhibits no edema or tenderness.  Lymphadenopathy:    He has no cervical adenopathy.  Neurological: He is alert and oriented to person, place, and time. He exhibits normal muscle tone. Coordination normal.  Skin: Skin is warm and dry. He is not diaphoretic. No erythema. No pallor.  Psychiatric: He has a normal mood and affect. His behavior is normal. Judgment and thought content normal.          Assessment & Plan:   HIV: continue ODEFSEY  (he is coming off Neurocognitive study shortly.  DM: With polyuria with A1c above 12 needs to be seen urgently by internal medicine already several months ago.  HTN: has been off meds in context of weight loss.  :There were no vitals filed for this visit.  Hyperlipidemia: at goal  Lipid Panel     Component Value  Date/Time   CHOL 140 07/26/2016 1005   TRIG 114 07/26/2016 1005   HDL 31 (L) 07/26/2016 1005   CHOLHDL 4.5 07/26/2016 1005   VLDL 23 07/26/2016 1005   LDLCALC 86 07/26/2016 1005     HIV neurocognitive disorder: Finishing the ACTG trial  I spent greater than 25 minutes with the patient including greater than 50% of time in face to face counsel of [place] on the need to be seen in internal medicine this as possible and to be on an optimal regimen to control his diabetes mellitus also with counseling of his current antiretroviral medication and how to continue taking it with food and avoid antacids and in coordination of his care.

## 2017-07-19 ENCOUNTER — Ambulatory Visit (INDEPENDENT_AMBULATORY_CARE_PROVIDER_SITE_OTHER): Payer: Self-pay | Admitting: Internal Medicine

## 2017-07-19 VITALS — BP 132/82 | HR 82 | Temp 98.0°F | Wt 250.9 lb

## 2017-07-19 DIAGNOSIS — M25561 Pain in right knee: Secondary | ICD-10-CM

## 2017-07-19 DIAGNOSIS — M25461 Effusion, right knee: Secondary | ICD-10-CM | POA: Insufficient documentation

## 2017-07-19 DIAGNOSIS — Z7984 Long term (current) use of oral hypoglycemic drugs: Secondary | ICD-10-CM

## 2017-07-19 DIAGNOSIS — E118 Type 2 diabetes mellitus with unspecified complications: Secondary | ICD-10-CM

## 2017-07-19 DIAGNOSIS — E1165 Type 2 diabetes mellitus with hyperglycemia: Secondary | ICD-10-CM

## 2017-07-19 HISTORY — DX: Effusion, right knee: M25.461

## 2017-07-19 LAB — GLUCOSE, CAPILLARY: Glucose-Capillary: 293 mg/dL — ABNORMAL HIGH (ref 65–99)

## 2017-07-19 LAB — POCT GLYCOSYLATED HEMOGLOBIN (HGB A1C): Hemoglobin A1C: 9.5

## 2017-07-19 MED ORDER — METFORMIN HCL ER 500 MG PO TB24: 1000.0000 mg | ORAL_TABLET | Freq: Every day | ORAL | 2 refills | Status: DC

## 2017-07-19 MED ORDER — GLIPIZIDE 5 MG PO TABS: 5.0000 mg | ORAL_TABLET | Freq: Every day | ORAL | 2 refills | Status: DC

## 2017-07-19 NOTE — Progress Notes (Signed)
   CC: DM follow up  HPI:  Wayne Wolfe is a 51 y.o. male with past medical history outlined below here for DM. For the details of today's visit, please refer to the assessment and plan.  Past Medical History:  Diagnosis Date  . Dizziness 03/13/2017  . HIV (human immunodeficiency virus infection) ([place]) 2009   11/20/2012 Last CD4 count 210 and VL <20  . 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 Hypertension 2009   Well controlled on HCTZ  . Otitis media 03/13/2017  . Polyuria 03/13/2017  . Poorly controlled diabetes mellitus ([place]) 03/13/2017  . Type 2 diabetes mellitus (HCC)    Well controlled on metformin  . Weight loss 03/13/2017    Review of Systems  Genitourinary: Negative for frequency.  Neurological: Negative for dizziness.     Physical Exam:  Vitals:   07/19/17 1019  BP: 132/82  Pulse: 82  Temp: 98 F (36.7 C)  TempSrc: Oral  SpO2: 100%  Weight: 250 lb 14.4 oz (113.8 kg)    Constitutional: NAD, appears comfortable  Cardiovascular: RRR, no murmurs, rubs, or gallops.  Pulmonary/Chest: CTAB, no wheezes, rales, or rhonchi. Extremities: Warm and well perfused. No edema.  Psychiatric: Normal mood and affect  Assessment & Plan:   See Encounters Tab for problem based charting.  Patient discussed with Dr. Rebeca Alert

## 2017-07-19 NOTE — Patient Instructions (Addendum)
Wayne Wolfe,  It was a pleasure to see you today. I have changed your metformin to once a day dosing. Please take 2 pills in the morning. I have also started a new medicine called glipizde. Please take this in the morning with breakfast. Follow up with [place] in 3 months for your diabetes. If you have any questions or concerns, call our clinic at [phone] or after hours call [phone] and ask for the internal medicine resident on call. Thank you!  - Dr. Philipp Ovens    Carbohydrate Counting for Diabetes Mellitus, Adult Carbohydrate counting is a method for keeping track of how many carbohydrates you eat. Eating carbohydrates naturally increases the amount of sugar (glucose) in the blood. Counting how many carbohydrates you eat helps keep your blood glucose within normal limits, which helps you manage your diabetes (diabetes mellitus). It is important to know how many carbohydrates you can safely have in each meal. This is different for every person. A diet and nutrition specialist (registered dietitian) can help you make a meal plan and calculate how many carbohydrates you should have at each meal and snack. Carbohydrates are found in the following foods:  Grains, such as breads and cereals.  Dried beans and soy products.  Starchy vegetables, such as potatoes, peas, and corn.  Fruit and fruit juices.  Milk and yogurt.  Sweets and snack foods, such as cake, cookies, candy, chips, and soft drinks.  How do I count carbohydrates? There are two ways to count carbohydrates in food. You can use either of the methods or a combination of both. Reading "Nutrition Facts" on packaged food The "Nutrition Facts" list is included on the labels of almost all packaged foods and beverages in the [place] It includes:  The serving size.  Information about nutrients in each serving, including the grams (g) of carbohydrate per serving.  To use the "Nutrition Facts":  Decide how many servings you will  have.  Multiply the number of servings by the number of carbohydrates per serving.  The resulting number is the total amount of carbohydrates that you will be having.  Learning standard serving sizes of other foods When you eat foods containing carbohydrates that are not packaged or do not include "Nutrition Facts" on the label, you need to measure the servings in order to count the amount of carbohydrates:  Measure the foods that you will eat with a food scale or measuring cup, if needed.  Decide how many standard-size servings you will eat.  Multiply the number of servings by 15. Most carbohydrate-rich foods have about 15 g of carbohydrates per serving. ? For example, if you eat 8 oz (170 g) of strawberries, you will have eaten 2 servings and 30 g of carbohydrates (2 servings x 15 g = 30 g).  For foods that have more than one food mixed, such as soups and casseroles, you must count the carbohydrates in each food that is included.  The following list contains standard serving sizes of common carbohydrate-rich foods. Each of these servings has about 15 g of carbohydrates:   hamburger bun or  English muffin.   oz (15 mL) syrup.   oz (14 g) jelly.  1 slice of bread.  1 six-inch tortilla.  3 oz (85 g) cooked rice or pasta.  4 oz (113 g) cooked dried beans.  4 oz (113 g) starchy vegetable, such as peas, corn, or potatoes.  4 oz (113 g) hot cereal.  4 oz (113 g) mashed potatoes or  of a large baked potato.  4 oz (113 g) canned or frozen fruit.  4 oz (120 mL) fruit juice.  4-6 crackers.  6 chicken nuggets.  6 oz (170 g) unsweetened dry cereal.  6 oz (170 g) plain fat-free yogurt or yogurt sweetened with artificial sweeteners.  8 oz (240 mL) milk.  8 oz (170 g) fresh fruit or one small piece of fruit.  24 oz (680 g) popped popcorn.  Example of carbohydrate counting Sample meal  3 oz (85 g) chicken breast.  6 oz (170 g) brown rice.  4 oz (113 g)  corn.  8 oz (240 mL) milk.  8 oz (170 g) strawberries with sugar-free whipped topping. Carbohydrate calculation 1. Identify the foods that contain carbohydrates: ? Rice. ? Corn. ? Milk. ? Strawberries. 2. Calculate how many servings you have of each food: ? 2 servings rice. ? 1 serving corn. ? 1 serving milk. ? 1 serving strawberries. 3. Multiply each number of servings by 15 g: ? 2 servings rice x 15 g = 30 g. ? 1 serving corn x 15 g = 15 g. ? 1 serving milk x 15 g = 15 g. ? 1 serving strawberries x 15 g = 15 g. 4. Add together all of the amounts to find the total grams of carbohydrates eaten: ? 30 g + 15 g + 15 g + 15 g = 75 g of carbohydrates total. This information is not intended to replace advice given to you by your health care provider. Make sure you discuss any questions you have with your health care provider. Document Released: 09/17/2005 Document Revised: 04/06/2016 Document Reviewed: 02/29/2016 Elsevier Interactive Patient Education  [employer].

## 2017-07-20 NOTE — Assessment & Plan Note (Signed)
Patient is complaining of a painful swollen knee that has persisted after injuring it on a chair two weeks ago. On exam, he has diffuse soft tissue swelling and a mild to moderate effusion of his right knee. He is able to weight bear and ambulate without difficulty. Informed patient that effusion is likely traumatic in nature and should resolve without intervention. Advised OTC NSAIDs and ice. Return precautions given if effusion persists or worsens, at which point he may require further work up with imaging and / or arthrocentesis.  -- OTC NSAIDs -- Ice -- Follow up as needed

## 2017-07-20 NOTE — Assessment & Plan Note (Signed)
Patient is here for DM follow up. He is prescribed metformin 1,000 mg BID. Reports compliance, but complains of daily GI side effects. A1c today has improved from 12.9 -> 9.5 today. Patient is uninsured limiting options for therapy. Today we discussed changing metformin to long acting formulation to help minimize side effects, and adding glipizide for better glycemic control. Patient is agreeable to this plan. -- Change metformin to ER 1,000 mg daily  -- Start Glipizide 5 mg daily  -- Follow up 3 months

## 2017-07-23 NOTE — Progress Notes (Signed)
Internal Medicine Clinic Attending  I saw and evaluated the patient.  I personally confirmed the key portions of the history and exam documented by Dr. Philipp Ovens and I reviewed pertinent patient test results.  The assessment, diagnosis, and plan were formulated together and I agree with the documentation in the resident's note.  Knee pain after bumping knee on chair, has mild effusion with diffuse pain, no point tenderness, good range of motion without laxity. Low concern for occult fracture or ligament injury. Recommended continuing NSAIDs and RICE therapy. Arthrocentesis unlikely to be useful in traumatic knee injury.   Will continue to work on diabetes control.   Oda Kilts, MD

## 2017-07-25 ENCOUNTER — Encounter (INDEPENDENT_AMBULATORY_CARE_PROVIDER_SITE_OTHER): Payer: Self-pay | Admitting: *Deleted

## 2017-07-25 VITALS — BP 135/89 | HR 74 | Temp 98.1°F | Resp 16 | Ht 67.25 in | Wt 248.8 lb

## 2017-07-25 DIAGNOSIS — Z006 Encounter for examination for normal comparison and control in clinical research program: Secondary | ICD-10-CM

## 2017-07-25 NOTE — Progress Notes (Signed)
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 He is asking about getting a meter and I told him to check with them about getting him one. He said he hurt his rt knee about 2 weeks ago and that it is still a little tender and swollen and that they had checked it out when he was there. He also recently got his flu vaccine. He denies any other new problems or changes with his meds. He said he ran out of his study meds for A5324 on 10/11 and since this was his last visit for the study we did not refill it. He will be returning for A5322 in February.

## 2017-07-26 LAB — COMPREHENSIVE METABOLIC PANEL
AG RATIO: 1.4 (calc) (ref 1.0–2.5)
ALT: 13 U/L (ref 9–46)
Albumin: 4 g/dL (ref 3.6–5.1)
Alkaline phosphatase (APISO): 62 U/L (ref 40–115)
BUN: 11 mg/dL (ref 7–25)
CO2: 29 mmol/L (ref 20–32)
CREATININE: 1.05 mg/dL (ref 0.70–1.33)
Calcium: 9.1 mg/dL (ref 8.6–10.3)
Chloride: 99 mmol/L (ref 98–110)
GLUCOSE: 234 mg/dL — AB (ref 65–99)
Globulin: 2.9 g/dL (calc) (ref 1.9–3.7)
Potassium: 3.9 mmol/L (ref 3.5–5.3)
SODIUM: 138 mmol/L (ref 135–146)
TOTAL PROTEIN: 6.9 g/dL (ref 6.1–8.1)
Total Bilirubin: 0.6 mg/dL (ref 0.2–1.2)

## 2017-07-26 LAB — LIPID PANEL
Cholesterol: 139 mg/dL (ref <200)
HDL: 27 mg/dL — ABNORMAL LOW (ref >40)
LDL CHOLESTEROL (CALC): 90 mg/dL
NON-HDL CHOLESTEROL (CALC): 112 mg/dL (ref <130)
TRIGLYCERIDES: 128 mg/dL (ref <150)
Total CHOL/HDL Ratio: 5.1 (calc) — ABNORMAL HIGH (ref <5.0)

## 2017-07-26 LAB — PHOSPHORUS: PHOSPHORUS: 3.8 mg/dL (ref 2.5–4.5)

## 2017-07-26 LAB — HEMOGLOBIN A1C
Hgb A1c MFr Bld: 11 % of total Hgb — ABNORMAL HIGH (ref <5.7)
MEAN PLASMA GLUCOSE: 269 (calc)
eAG (mmol/L): 14.9 (calc)

## 2017-07-26 LAB — PROTEIN / CREATININE RATIO, URINE
Creatinine, Urine: 239 mg/dL (ref 20–320)
PROTEIN/CREAT RATIO: 172 mg/g{creat} — AB (ref 22–128)
TOTAL PROTEIN, URINE: 41 mg/dL — AB (ref 5–25)

## 2017-07-26 LAB — EXTRA URINE SPECIMEN

## 2017-08-03 LAB — HIV-1 RNA QUANT-NO REFLEX-BLD

## 2017-08-08 ENCOUNTER — Encounter: Payer: Self-pay | Admitting: *Deleted

## 2017-08-08 LAB — CD4/CD8 (T-HELPER/T-SUPPRESSOR CELL)
CD4 COUNT: 465
CD4 T CELL HELPER: 20.2
CD8 % Suppressor T Cell: 57
CD8 T Cell Abs: 1311

## 2017-08-23 ENCOUNTER — Other Ambulatory Visit: Payer: Self-pay | Admitting: Infectious Disease

## 2017-08-23 DIAGNOSIS — B20 Human immunodeficiency virus [HIV] disease: Secondary | ICD-10-CM

## 2017-08-24 ENCOUNTER — Encounter: Payer: Self-pay | Admitting: *Deleted

## 2017-10-09 ENCOUNTER — Ambulatory Visit: Payer: Self-pay

## 2017-10-13 ENCOUNTER — Other Ambulatory Visit: Payer: Self-pay | Admitting: Internal Medicine

## 2017-10-13 DIAGNOSIS — E1165 Type 2 diabetes mellitus with hyperglycemia: Secondary | ICD-10-CM

## 2017-10-14 ENCOUNTER — Other Ambulatory Visit: Payer: Self-pay | Admitting: Internal Medicine

## 2017-10-14 DIAGNOSIS — E1165 Type 2 diabetes mellitus with hyperglycemia: Secondary | ICD-10-CM

## 2017-10-15 NOTE — Telephone Encounter (Signed)
We will talk about 90 supply at appt. Thank you! His medication has already been refilled.

## 2017-10-16 DIAGNOSIS — Z113 Encounter for screening for infections with a predominantly sexual mode of transmission: Secondary | ICD-10-CM | POA: Insufficient documentation

## 2017-10-16 DIAGNOSIS — Z0000 Encounter for general adult medical examination without abnormal findings: Secondary | ICD-10-CM | POA: Insufficient documentation

## 2017-10-16 NOTE — Progress Notes (Signed)
   CC: Diabetes Mellitus Follow Up  HPI:  [name] is a 52 y.o. with hypertension and diabetes mellitus type 2 presented for a diabetes follow up. Please see problem based charting for evaluation, assessment, and plan.  Past Medical History:  Diagnosis Date  . Dizziness 03/13/2017  . HIV (human immunodeficiency virus infection) ([place]) 2009   11/20/2012 Last CD4 count 210 and VL <20  . 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 Hypertension 2009   Well controlled on HCTZ  . Otitis media 03/13/2017  . Polyuria 03/13/2017  . Poorly controlled diabetes mellitus ([place]) 03/13/2017  . Type 2 diabetes mellitus ([facility])    Well controlled on metformin  . Weight loss 03/13/2017   Review of Systems:  Denies headaches, vomiting  Occasional nausea, lightheadedness, muscle aches   Physical Exam:  Vitals:   10/18/17 1458  BP: 118/70  Pulse: 93  Temp: 98.1 F (36.7 C)  TempSrc: Oral  SpO2: 99%  Weight: 253 lb 1.6 oz (114.8 kg)  Height: 5\' 7"  (1.702 m)    Physical Exam  Constitutional: He appears well-developed and well-nourished. No distress.  HENT:  Head: Normocephalic and atraumatic.  Eyes: Conjunctivae are normal.  Cardiovascular: Normal rate, regular rhythm, normal heart sounds and intact distal pulses.  Respiratory: Effort normal and breath sounds normal. No respiratory distress. He has no wheezes.  GI: Soft. Bowel sounds are normal. He exhibits no distension. There is no tenderness.  Musculoskeletal: He exhibits no edema.  Lymphadenopathy:    He has no cervical adenopathy.  Neurological: He is alert.  Skin: He is not diaphoretic. No erythema.  Psychiatric: He has a normal mood and affect. His behavior is normal. Judgment and thought content normal.    Assessment & Plan:   See Encounters Tab for problem based charting.  Patient discussed with Dr. Beryle Beams

## 2017-10-18 ENCOUNTER — Other Ambulatory Visit: Payer: Self-pay

## 2017-10-18 ENCOUNTER — Ambulatory Visit (INDEPENDENT_AMBULATORY_CARE_PROVIDER_SITE_OTHER): Payer: Self-pay | Admitting: Internal Medicine

## 2017-10-18 ENCOUNTER — Encounter: Payer: Self-pay | Admitting: Internal Medicine

## 2017-10-18 VITALS — BP 118/70 | HR 93 | Temp 98.1°F | Ht 67.0 in | Wt 253.1 lb

## 2017-10-18 DIAGNOSIS — I10 Essential (primary) hypertension: Secondary | ICD-10-CM

## 2017-10-18 DIAGNOSIS — E1169 Type 2 diabetes mellitus with other specified complication: Secondary | ICD-10-CM

## 2017-10-18 DIAGNOSIS — E1159 Type 2 diabetes mellitus with other circulatory complications: Secondary | ICD-10-CM

## 2017-10-18 DIAGNOSIS — Z0000 Encounter for general adult medical examination without abnormal findings: Secondary | ICD-10-CM

## 2017-10-18 DIAGNOSIS — I152 Hypertension secondary to endocrine disorders: Secondary | ICD-10-CM

## 2017-10-18 DIAGNOSIS — Z87891 Personal history of nicotine dependence: Secondary | ICD-10-CM

## 2017-10-18 DIAGNOSIS — E1165 Type 2 diabetes mellitus with hyperglycemia: Secondary | ICD-10-CM

## 2017-10-18 DIAGNOSIS — E785 Hyperlipidemia, unspecified: Secondary | ICD-10-CM

## 2017-10-18 DIAGNOSIS — Z7984 Long term (current) use of oral hypoglycemic drugs: Secondary | ICD-10-CM

## 2017-10-18 DIAGNOSIS — B20 Human immunodeficiency virus [HIV] disease: Secondary | ICD-10-CM

## 2017-10-18 DIAGNOSIS — Z79899 Other long term (current) drug therapy: Secondary | ICD-10-CM

## 2017-10-18 LAB — POCT GLYCOSYLATED HEMOGLOBIN (HGB A1C): Hemoglobin A1C: 10.2

## 2017-10-18 LAB — GLUCOSE, CAPILLARY: Glucose-Capillary: 319 mg/dL — ABNORMAL HIGH (ref 65–99)

## 2017-10-18 MED ORDER — LIRAGLUTIDE 18 MG/3ML ~~LOC~~ SOPN: PEN_INJECTOR | SUBCUTANEOUS | 0 refills | Status: DC

## 2017-10-18 MED ORDER — METFORMIN HCL ER (OSM) 1000 MG PO TB24: 1000.0000 mg | ORAL_TABLET | Freq: Two times a day (BID) | ORAL | 1 refills | Status: DC

## 2017-10-18 MED ORDER — GLIPIZIDE 5 MG PO TABS: 5.0000 mg | ORAL_TABLET | Freq: Every day | ORAL | 2 refills | Status: DC

## 2017-10-18 NOTE — Patient Instructions (Addendum)
It was a pleasure to see you today [name]. Please make the following changes:  Please start taking victoza 0.4m daily for the first week and then 1.252mafter the first week.  Please take metformin [id number] daily Please check your blood sugar before meals and after meals   If you have any questions or concerns, please call our clinic at [phone] 9am-5pm and after hours call [phone] and ask for the internal medicine resident on call. If you feel you are having a medical emergency please call 911.   Thank you, we look forward to help you remain healthy!  Wayne Wolfe Internal Medicine PGY1   Liraglutide injection What is this medicine? LIRAGLUTIDE (LIR a GLOO tide) is used to improve blood sugar control in adults with type 2 diabetes. This medicine may be used with other diabetes medicines. This drug may also reduce the risk of heart attack or stroke if you have type 2 diabetes and risk factors for heart disease. This medicine may be used for other purposes; ask your health care provider or pharmacist if you have questions. COMMON BRAND NAME(S): Victoza What should I tell my health care provider before I take this medicine? They need to know if you have any of these conditions: -endocrine tumors (MEN 2) or if someone in your family had these tumors -gallbladder disease -high cholesterol -history of alcohol abuse problem -history of pancreatitis -kidney disease or if you are on dialysis -liver disease -previous swelling of the tongue, face, or lips with difficulty breathing, difficulty swallowing, hoarseness, or tightening of the throat -stomach problems -thyroid cancer or if someone in your family had thyroid cancer -an unusual or allergic reaction to liraglutide, other medicines, foods, dyes, or preservatives -pregnant or trying to get pregnant -breast-feeding How should I use this medicine? This medicine is for injection under the skin of your upper  leg, stomach area, or upper arm. You will be taught how to prepare and give this medicine. Use exactly as directed. Take your medicine at regular intervals. Do not take it more often than directed. It is important that you put your used needles and syringes in a special sharps container. Do not put them in a trash can. If you do not have a sharps container, call your pharmacist or healthcare provider to get one. A special MedGuide will be given to you by the pharmacist with each prescription and refill. Be sure to read this information carefully each time. Talk to your pediatrician regarding the use of this medicine in children. Special care may be needed. Overdosage: If you think you have taken too much of this medicine contact a poison control center or emergency room at once. NOTE: This medicine is only for you. Do not share this medicine with others. What if I miss a dose? If you miss a dose, take it as soon as you can. If it is almost time for your next dose, take only that dose. Do not take double or extra doses. What may interact with this medicine? -other medicines for diabetes Many medications may cause changes in blood sugar, these include: -alcohol containing beverages -antiviral medicines for HIV or AIDS -aspirin and aspirin-like drugs -certain medicines for blood pressure, heart disease, irregular heart beat -chromium -diuretics -male hormones, such as estrogens or progestins, birth control pills -fenofibrate -gemfibrozil -isoniazid -lanreotide -male hormones or anabolic steroids -MAOIs like Carbex, Eldepryl, Marplan, Nardil, and Parnate -medicines for weight loss -medicines for allergies, asthma, cold, or cough -medicines for  depression, anxiety, or psychotic disturbances -niacin -nicotine -NSAIDs, medicines for pain and inflammation, like ibuprofen or naproxen -octreotide -pasireotide -pentamidine -phenytoin -probenecid -quinolone antibiotics such as ciprofloxacin,  levofloxacin, ofloxacin -some herbal dietary supplements -steroid medicines such as prednisone or cortisone -sulfamethoxazole; trimethoprim -thyroid hormones Some medications can hide the warning symptoms of low blood sugar (hypoglycemia). You may need to monitor your blood sugar more closely if you are taking one of these medications. These include: -beta-blockers, often used for high blood pressure or heart problems (examples include atenolol, metoprolol, propranolol) -clonidine -guanethidine -reserpine This list may not describe all possible interactions. Give your health care provider a list of all the medicines, herbs, non-prescription drugs, or dietary supplements you use. Also tell them if you smoke, drink alcohol, or use illegal drugs. Some items may interact with your medicine. What should I watch for while using this medicine? Visit your doctor or health care professional for regular checks on your progress. Drink plenty of fluids while taking this medicine. Check with your doctor or health care professional if you get an attack of severe diarrhea, nausea, and vomiting. The loss of too much body fluid can make it dangerous for you to take this medicine. A test called the HbA1C (A1C) will be monitored. This is a simple blood test. It measures your blood sugar control over the last 2 to 3 months. You will receive this test every 3 to 6 months. Learn how to check your blood sugar. Learn the symptoms of low and high blood sugar and how to manage them. Always carry a quick-source of sugar with you in case you have symptoms of low blood sugar. Examples include hard sugar candy or glucose tablets. Make sure others know that you can choke if you eat or drink when you develop serious symptoms of low blood sugar, such as seizures or unconsciousness. They must get medical help at once. Tell your doctor or health care professional if you have high blood sugar. You might need to change the dose of your  medicine. If you are sick or exercising more than usual, you might need to change the dose of your medicine. Do not skip meals. Ask your doctor or health care professional if you should avoid alcohol. Many nonprescription cough and cold products contain sugar or alcohol. These can affect blood sugar. Pens should never be shared. Even if the needle is changed, sharing may result in passing of viruses like hepatitis or HIV. Wear a medical ID bracelet or chain, and carry a card that describes your disease and details of your medicine and dosage times. What side effects may I notice from receiving this medicine? Side effects that you should report to your doctor or health care professional as soon as possible: -allergic reactions like skin rash, itching or hives, swelling of the face, lips, or tongue -breathing problems -diarrhea that continues or is severe -lump or swelling on the neck -severe nausea -signs and symptoms of infection like fever or chills; cough; sore throat; pain or trouble passing urine -signs and symptoms of low blood sugar such as feeling anxious, confusion, dizziness, increased hunger, unusually weak or tired, sweating, shakiness, cold, irritable, headache, blurred vision, fast heartbeat, loss of consciousness -signs and symptoms of kidney injury like trouble passing urine or change in the amount of urine -trouble swallowing -unusual stomach upset or pain -vomiting Side effects that usually do not require medical attention (report to your doctor or health care professional if they continue or are bothersome): -constipation -decreased  appetite -diarrhea -fatigue -headache -nausea -pain, redness, or irritation at site where injected -stomach upset -stuffy or runny nose This list may not describe all possible side effects. Call your doctor for medical advice about side effects. You may report side effects to FDA at 1-800-FDA-1088. Where should I keep my medicine? Keep out  of the reach of children. Store unopened pen in a refrigerator between 2 and 8 degrees C (36 and 46 degrees F). Do not freeze or use if the medicine has been frozen. Protect from light and excessive heat. After you first use the pen, it can be stored at room temperature between 15 and 30 degrees C (59 and 86 degrees F) or in a refrigerator. Throw away your used pen after 30 days or after the expiration date, whichever comes first. Do not store your pen with the needle attached. If the needle is left on, medicine may leak from the pen. NOTE: This sheet is a summary. It may not cover all possible information. If you have questions about this medicine, talk to your doctor, pharmacist, or health care provider.  2018 Elsevier/Gold Standard (2016-10-04 14:39:40)   Hypoglycemia Hypoglycemia is when the sugar (glucose) level in the blood is too low. Symptoms of low blood sugar may include:  Feeling: ? Hungry. ? Worried or nervous (anxious). ? Sweaty and clammy. ? Confused. ? Dizzy. ? Sleepy. ? Sick to your stomach (nauseous).  Having: ? A fast heartbeat. ? A headache. ? A change in your vision. ? Jerky movements that you cannot control (seizure). ? Nightmares. ? Tingling or no feeling (numbness) around the mouth, lips, or tongue.  Having trouble with: ? Talking. ? Paying attention (concentrating). ? Moving (coordination). ? Sleeping.  Shaking.  Passing out (fainting).  Getting upset easily (irritability).  Low blood sugar can happen to people who have diabetes and people who do not have diabetes. Low blood sugar can happen quickly, and it can be an emergency. Treating Low Blood Sugar Low blood sugar is often treated by eating or drinking something sugary right away. If you can think clearly and swallow safely, follow the 15:15 rule:  Take 15 grams of a fast-acting carb (carbohydrate). Some fast-acting carbs are: ? 1 tube of glucose gel. ? 3 sugar tablets (glucose pills). ? 6-8  pieces of hard candy. ? 4 oz (120 mL) of fruit juice. ? 4 oz (120 mL) of regular (not diet) soda.  Check your blood sugar 15 minutes after you take the carb.  If your blood sugar is still at or below 70 mg/dL (3.9 mmol/L), take 15 grams of a carb again.  If your blood sugar does not go above 70 mg/dL (3.9 mmol/L) after 3 tries, get help right away.  After your blood sugar goes back to normal, eat a meal or a snack within 1 hour.  Treating Very Low Blood Sugar If your blood sugar is at or below 54 mg/dL (3 mmol/L), you have very low blood sugar (severe hypoglycemia). This is an emergency. Do not wait to see if the symptoms will go away. Get medical help right away. Call your local emergency services (911 in the [place] Do not drive yourself to the hospital. If you have very low blood sugar and you cannot eat or drink, you may need a glucagon shot (injection). A family member or friend should learn how to check your blood sugar and how to give you a glucagon shot. Ask your doctor if you need to have a glucagon shot  kit at home. Follow these instructions at home: General instructions  Avoid any diets that cause you to not eat enough food. Talk with your doctor before you start any new diet.  Take over-the-counter and prescription medicines only as told by your doctor.  Limit alcohol to no more than 1 drink per day for nonpregnant women and 2 drinks per day for men. One drink equals 12 oz of beer, 5 oz of wine, or 1 oz of hard liquor.  Keep all follow-up visits as told by your doctor. This is important. If You Have Diabetes:   Make sure you know the symptoms of low blood sugar.  Always keep a source of sugar with you, such as: ? Sugar. ? Sugar tablets. ? Glucose gel. ? Fruit juice. ? Regular soda (not diet soda). ? Milk. ? Hard candy. ? Honey.  Take your medicines as told.  Follow your exercise and meal plan. ? Eat on time. Do not skip meals. ? Follow your sick day plan when  you cannot eat or drink normally. Make this plan ahead of time with your doctor.  Check your blood sugar as often as told by your doctor. Always check before and after exercise.  Share your diabetes care plan with: ? Your work or school. ? People you live with.  Check your pee (urine) for ketones: ? When you are sick. ? As told by your doctor.  Carry a card or wear jewelry that says you have diabetes. If You Have Low Blood Sugar From Other Causes:   Check your blood sugar as often as told by your doctor.  Follow instructions from your doctor about what you cannot eat or drink. Contact a doctor if:  You have trouble keeping your blood sugar in your target range.  You have low blood sugar often. Get help right away if:  You still have symptoms after you eat or drink something sugary.  Your blood sugar is at or below 54 mg/dL (3 mmol/L).  You have jerky movements that you cannot control.  You pass out. These symptoms may be an emergency. Do not wait to see if the symptoms will go away. Get medical help right away. Call your local emergency services (911 in the [place] Do not drive yourself to the hospital. This information is not intended to replace advice given to you by your health care provider. Make sure you discuss any questions you have with your health care provider. Document Released: 12/12/2009 Document Revised: 02/23/2016 Document Reviewed: 10/21/2015 Elsevier Interactive Patient Education  [employer].

## 2017-10-19 LAB — MICROALBUMIN / CREATININE URINE RATIO
Creatinine, Urine: 85.1 mg/dL
Microalb/Creat Ratio: 11.4 mg/g creat (ref 0.0–30.0)
Microalbumin, Urine: 9.7 ug/mL

## 2017-10-19 NOTE — Assessment & Plan Note (Signed)
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 The patient is on tivicay , odefsey , and maraviroc .  -Recommended follow up with Infectious Disease "

## 2017-10-19 NOTE — Assessment & Plan Note (Signed)
The patient's last cd4 count is 46 and viral load is <40. The patient is on tivicay, odefsey, and maraviroc.  -Recommended follow up with Infectious Disease

## 2017-10-19 NOTE — Assessment & Plan Note (Addendum)
The patient's a1c was 10.2 and random glucose was 319. The patient's last a1c=11 in October 2018. The patient is taking glipizide 5mg  qd and metformin 1000mg  qd . The patient states that he is compliant with metformin, but has not taken glipizide in 1 month. The patient does not note episodes of hypoglycemia. The patient has polyuria, nocturia, and abdominal pain. The patient has gained 5lbs since 07/25/17.  -Increased metformin to 1000mg  bid -Continue glipizide 5mg  qd -Started victoza 0.6mg  daily for first week and then 1.2mg  daily after  -checking urine microalbumin

## 2017-10-19 NOTE — Assessment & Plan Note (Signed)
Referral for colonoscopy given  Urine microalbmin

## 2017-10-19 NOTE — Assessment & Plan Note (Signed)
The patient's blood pressure during this admission was 118/70.  The patient is taking hctz 25mg  qd.   -continue hctz 25mg  qd

## 2017-10-21 ENCOUNTER — Ambulatory Visit (INDEPENDENT_AMBULATORY_CARE_PROVIDER_SITE_OTHER): Payer: Self-pay | Admitting: Infectious Disease

## 2017-10-21 ENCOUNTER — Encounter: Payer: Self-pay | Admitting: Infectious Disease

## 2017-10-21 VITALS — BP 134/83 | HR 91 | Temp 98.0°F | Ht 66.0 in | Wt 248.0 lb

## 2017-10-21 DIAGNOSIS — B20 Human immunodeficiency virus [HIV] disease: Secondary | ICD-10-CM

## 2017-10-21 DIAGNOSIS — E1165 Type 2 diabetes mellitus with hyperglycemia: Secondary | ICD-10-CM

## 2017-10-21 DIAGNOSIS — E1159 Type 2 diabetes mellitus with other circulatory complications: Secondary | ICD-10-CM

## 2017-10-21 DIAGNOSIS — I152 Hypertension secondary to endocrine disorders: Secondary | ICD-10-CM

## 2017-10-21 DIAGNOSIS — I10 Essential (primary) hypertension: Secondary | ICD-10-CM

## 2017-10-21 NOTE — Progress Notes (Signed)
Medicine attending: Medical history, presenting problems, physical findings, and medications, reviewed with resident physician Dr Lars Mage on the day of the patient visit and I concur with her evaluation and management plan. Poor glucose control. We will increase glucophage. Add Victoza.

## 2017-10-21 NOTE — Progress Notes (Signed)
Chief complaint: followup for HIV, also poorly controlled diabetes mellitus  Subjective:    Patient ID: Wayne Wolfe, male    DOB: Feb 15, 1966, 52 y.o.   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  He has been doing perfectly with suppression of virus and healthy CD4.   Lab Results  Component Value Date   HIV1RNAQUANT <20 12/07/2013   HIV1RNAQUANT <20 07/06/2013   HIV1RNAQUANT <20 04/16/2013      Lab Results  Component Value Date   CD4TABS 370 08/09/2016   CD4TABS 444 07/26/2016   CD4TABS 394 02/16/2016    He has been diagnosed with diabetes mellitus and being followed in the internal medicine clinic.  They tried to place him on Victoza but this drug is not on the HIV Medication Assistance Program. He will need to work with [facility] clinic and patient assistance program to obtain this medication.  Past Medical History:  Diagnosis Date  . Dizziness 03/13/2017  . HIV (human immunodeficiency virus infection) ([place]) 2009   11/20/2012 Last CD4 count 210 and VL <20  . 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 Hypertension 2009   Well controlled on HCTZ  . Otitis media 03/13/2017  . Polyuria 03/13/2017  . Poorly controlled diabetes mellitus ([place]) 03/13/2017  . Type 2 diabetes mellitus (HCC)    Well controlled on metformin  . Weight loss 03/13/2017    Past Surgical History:  Procedure Laterality Date  . CARPAL TUNNEL RELEASE Right 1990s  . none      Family History  Problem Relation Age of Onset  . Diabetes Mellitus II Sister   . Diabetes Mellitus II Mother   . Coronary artery disease Mother 22       CABGx6  . Coronary artery  disease Sister 55       CABG  . Diabetes Mellitus II Sister   . Coronary artery disease Sister   . Diabetes Mellitus II Sister       Social History   Socioeconomic History  . Marital status: Single    Spouse name: None  . Number of children: None  . Years of education: 52  . Highest education level: None  Social Needs  . Financial resource strain: None  . Food insecurity - worry: None  . Food insecurity - inability: None  . Transportation needs - medical: None  . Transportation needs - non-medical: None  Occupational History    Employer: UNEMPLOYED  Tobacco Use  . Smoking status: Former Research scientist (life sciences)  . Smokeless tobacco: Never Used  Substance and Sexual Activity  . Alcohol use: No    Alcohol/week: 0.0 oz    Comment: Last alcohol 10 years ago, former moderate use, never binge.   . Drug use: No  . Sexual activity: No    Birth control/protection: Condom    Comment: accepted condoms  Other Topics Concern  . None  Social History Narrative   On disability. Lives in [place].     No Known Allergies   Current Outpatient Medications:  .  Blood Glucose Monitoring Suppl (WAVESENSE AMP) w/Device KIT, Use to check blood glucose daily., Disp: 1 kit, Rfl: 0 .  glipiZIDE (GLUCOTROL) 5 MG tablet, Take 1  tablet (5 mg total) by mouth daily before breakfast., Disp: 30 tablet, Rfl: 2 .  hydrochlorothiazide (HYDRODIURIL) 25 MG tablet, Take 1 tablet (25 mg total) by mouth daily., Disp: 30 tablet, Rfl: 6 .  metFORMIN (FORTAMET) 1000 MG (OSM) 24 hr tablet, Take 1 tablet (1,000 mg total) by mouth 2 (two) times daily., Disp: 60 tablet, Rfl: 1 .  ODEFSEY 200-25-25 MG TABS tablet, TAKE 1 TABLET BY MOUTH DAILY WITH BREAKFAST, Disp: 30 tablet, Rfl: 3 .  pravastatin (PRAVACHOL) 20 MG tablet, TAKE 1 TABLET BY MOUTH EVERY DAY, Disp: 30 tablet, Rfl: 5 .  pravastatin (PRAVACHOL) 40 MG tablet, Take 1 tablet (40 mg total) by mouth daily., Disp: 90 tablet, Rfl: 3 .  dolutegravir (TIVICAY) 50 MG tablet, Take  1 tablet (50 mg total) by mouth daily. This is study provided, may be placebo. Do not fill prescription. (Patient not taking: Reported on 07/25/2017), Disp: 30 tablet, Rfl: 11 .  liraglutide (VICTOZA) 18 MG/3ML SOPN, Please take 0.18m once daily for the first 1 week and then 1.23mdaily thereafter (Patient not taking: Reported on 10/21/2017), Disp: 3 mL, Rfl: 0    Review of Systems  Constitutional: Negative for activity change, appetite change, chills, diaphoresis, fatigue and fever.  HENT: Negative for congestion, rhinorrhea, sinus pressure, sneezing, sore throat and trouble swallowing.   Eyes: Negative for photophobia and visual disturbance.  Respiratory: Negative for cough, chest tightness, shortness of breath, wheezing and stridor.   Cardiovascular: Negative for chest pain, palpitations and leg swelling.  Gastrointestinal: Negative for abdominal distention, abdominal pain, anal bleeding, blood in stool, constipation, diarrhea, nausea and vomiting.  Endocrine: Positive for polydipsia and polyuria.  Genitourinary: Negative for difficulty urinating, dysuria, flank pain and hematuria.  Musculoskeletal: Negative for arthralgias, back pain, gait problem, joint swelling and myalgias.  Skin: Negative for color change, pallor, rash and wound.  Neurological: Negative for tremors, weakness and light-headedness.  Hematological: Negative for adenopathy. Does not bruise/bleed easily.  Psychiatric/Behavioral: Negative for agitation, behavioral problems, confusion, dysphoric mood and sleep disturbance.       Objective:   Physical Exam  Constitutional: He is oriented to person, place, and time. He appears well-developed and well-nourished. No distress.  HENT:  Head: Normocephalic and atraumatic.  Right Ear: There is swelling. Tympanic membrane is erythematous. A middle ear effusion is present.  Left Ear: A middle ear effusion is present.  Mouth/Throat: Oropharynx is clear and moist. No oropharyngeal  exudate.  Eyes: Conjunctivae and EOM are normal. Pupils are equal, round, and reactive to light. No scleral icterus.  Neck: Normal range of motion. Neck supple. No JVD present.  Cardiovascular: Normal heart sounds.  Pulmonary/Chest: Effort normal and breath sounds normal. No respiratory distress. He has no wheezes. He has no rales. He exhibits no tenderness.  Abdominal: He exhibits no distension.  Musculoskeletal: He exhibits no edema or tenderness.  Lymphadenopathy:    He has no cervical adenopathy.  Neurological: He is alert and oriented to person, place, and time. He exhibits normal muscle tone. Coordination normal.  Skin: Skin is warm and dry. He is not diaphoretic. No erythema. No pallor.  Psychiatric: He has a normal mood and affect. His behavior is normal. Judgment and thought content normal.          Assessment & Plan:   HIV: continue ODEFSEY  (he is coming off Neurocognitive study shortly.  DM:  See above discussion> I talked with Dr. ChMaricela Bond she will discuss with outpatient clinics pharmacy and pharmacy assistance  program to get him assistance we can get this important medication.  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 Maricela Bo.

## 2017-10-23 ENCOUNTER — Other Ambulatory Visit: Payer: Self-pay | Admitting: Internal Medicine

## 2017-10-23 NOTE — Telephone Encounter (Signed)
Hello Dr. Maudie Mercury,  I just wanted to follow up on the victoza and needles for this patient. Have we been able to get this at an affordable cost for him? Thank you!  Kierra Jezewski

## 2017-10-23 NOTE — Telephone Encounter (Signed)
PATIENT WOULD LIKE VICTOZA NEEDLES TO GO WITH THIS [place] [address]

## 2017-10-24 ENCOUNTER — Encounter: Payer: Self-pay | Admitting: Pharmacist

## 2017-10-24 ENCOUNTER — Other Ambulatory Visit: Payer: Self-pay | Admitting: Dietician

## 2017-10-24 DIAGNOSIS — E1165 Type 2 diabetes mellitus with hyperglycemia: Secondary | ICD-10-CM

## 2017-10-24 MED ORDER — METFORMIN HCL ER 500 MG PO TB24: 1000.0000 mg | ORAL_TABLET | Freq: Every day | ORAL | 11 refills | Status: DC

## 2017-10-24 MED ORDER — ACCU-CHEK GUIDE W/DEVICE KIT: 1.0000 | PACK | Freq: Every day | 0 refills | Status: DC

## 2017-10-24 MED ORDER — GLUCOSE BLOOD VI STRP: ORAL_STRIP | 12 refills | Status: AC

## 2017-10-24 MED ORDER — LIRAGLUTIDE 18 MG/3ML ~~LOC~~ SOPN: 1.2000 mg | PEN_INJECTOR | Freq: Every day | SUBCUTANEOUS | 0 refills | Status: DC

## 2017-10-24 MED ORDER — ACCU-CHEK FASTCLIX LANCETS MISC: 12 refills | Status: DC

## 2017-10-24 MED ORDER — PEN NEEDLES 31G X 5 MM MISC: 1.0000 | Freq: Every day | 4 refills | Status: DC

## 2017-10-24 NOTE — Telephone Encounter (Signed)
Spoke with [name], he got the victoza, but still needs pen needles, a meter and supplies. He thinks his insurance may pay for all of those and requested a prescription be sent to [facility] on [address].  I also informed him about the [facility] Prime meter for 9$ for meter and 9$ for 50 strips in case his insurance does not cover a meter or is more expensive.

## 2017-10-24 NOTE — Progress Notes (Signed)
Patient requested Victoza and Metformin transfers from [facility] to [place]. Prescriptions sent per patient request.

## 2017-10-25 ENCOUNTER — Encounter: Payer: Self-pay | Admitting: Internal Medicine

## 2017-10-28 NOTE — Telephone Encounter (Signed)
Per chart, pen needles were refilled and sent to [facility].

## 2017-10-29 ENCOUNTER — Encounter: Payer: Self-pay | Admitting: Internal Medicine

## 2017-11-13 ENCOUNTER — Other Ambulatory Visit: Payer: Self-pay | Admitting: Internal Medicine

## 2017-11-13 DIAGNOSIS — E1165 Type 2 diabetes mellitus with hyperglycemia: Secondary | ICD-10-CM

## 2017-11-20 ENCOUNTER — Encounter: Payer: Self-pay | Admitting: Infectious Disease

## 2017-11-24 ENCOUNTER — Other Ambulatory Visit: Payer: Self-pay | Admitting: Internal Medicine

## 2017-11-24 DIAGNOSIS — E1165 Type 2 diabetes mellitus with hyperglycemia: Secondary | ICD-10-CM

## 2017-11-26 ENCOUNTER — Encounter (INDEPENDENT_AMBULATORY_CARE_PROVIDER_SITE_OTHER): Payer: Self-pay | Admitting: *Deleted

## 2017-11-26 VITALS — BP 129/88 | HR 87 | Temp 98.1°F | Wt 250.2 lb

## 2017-11-26 DIAGNOSIS — Z006 Encounter for examination for normal comparison and control in clinical research program: Secondary | ICD-10-CM

## 2017-11-26 NOTE — Progress Notes (Signed)
Wayne Wolfe is here for his week 5 visit for A5322. He says he is doing fine. He is now taking Victoza everyday and he says he has noticed that his blood sugars are better. He is also trying to watch what he eats and exercise. He will be returning in July for the next visit.

## 2017-11-28 NOTE — Addendum Note (Signed)
Addended by: Hulan Fray on: 11/28/2017 05:53 PM   Modules accepted: Orders

## 2017-12-06 ENCOUNTER — Other Ambulatory Visit: Payer: Self-pay | Admitting: Oncology

## 2017-12-10 ENCOUNTER — Encounter: Payer: Self-pay | Admitting: Infectious Diseases

## 2017-12-10 ENCOUNTER — Ambulatory Visit (INDEPENDENT_AMBULATORY_CARE_PROVIDER_SITE_OTHER): Payer: Self-pay | Admitting: Infectious Diseases

## 2017-12-10 VITALS — BP 128/86 | HR 87 | Temp 97.7°F | Wt 252.0 lb

## 2017-12-10 DIAGNOSIS — B20 Human immunodeficiency virus [HIV] disease: Secondary | ICD-10-CM

## 2017-12-10 DIAGNOSIS — J069 Acute upper respiratory infection, unspecified: Secondary | ICD-10-CM

## 2017-12-10 NOTE — Assessment & Plan Note (Signed)
HIV 1 RNA Quant (copies/mL)  Date Value  12/07/2013 <20  07/06/2013 <20  04/16/2013 <20   HIV-1 RNA Viral Load (no units)  Date Value  07/25/2017 <40  01/15/2017 <40  08/09/2016 <40   CD4 (no units)  Date Value  08/09/2016 370  07/26/2016 444  02/16/2016 394   Continue follow up as previously scheduled.

## 2017-12-10 NOTE — Assessment & Plan Note (Addendum)
Seems to be getting better. No fevers, wheezing, cough present today. Middle ear effusion present on the left without purulence/erythema of TM. I suspect he has a component of allergies that are contributing to his symptoms. Will have him start taking some Allegra. Advised he can use over the counter Flonase for symptoms with sparing short term decongestants (he has hypertension but currently under good control). No indication for antibiotic today. Advised to continue with supportive care with OTC medications and rest. Will call back if he has increase in severity of symptoms.

## 2017-12-10 NOTE — Assessment & Plan Note (Signed)
" >>  ASSESSMENT AND PLAN FOR HUMAN IMMUNODEFICIENCY VIRUS (HIV) DISEASE (HCC) WRITTEN ON 12/10/2017  3:48 PM BY MELVENIA KRABBE N, NP  HIV 1 RNA Quant (copies/mL)  Date Value  12/07/2013 <20  07/06/2013 <20  04/16/2013 <20   HIV-1 RNA Viral Load (no units)  Date Value  07/25/2017 <40  01/15/2017 <40  08/09/2016 <40   CD4 (no units)  Date Value  08/09/2016 370  07/26/2016 444  02/16/2016 394   Continue follow up as previously scheduled.  "

## 2017-12-10 NOTE — Patient Instructions (Addendum)
I believe you have a viral upper respiratory illness ("cold") that will get better with a little more time.   Would take Allegra for your itchy/watery eyes. Can use Benadryl at night for sleep.   Tylenol as needed for pain.   Call back if your symptoms change or they get suddenly worse to be re-examined.    General Recommendations:    Please drink plenty of fluids.  Get plenty of rest   Sleep in humidified air  Use saline nasal sprays  Netti pot   OTC Medications:  Decongestants - helps relieve congestion   Flonase (generic fluticasone) or Nasacort (generic triamcinolone) - please make sure to use the "cross-over" technique at a 45 degree angle towards the opposite eye as opposed to straight up the nasal passageway.   Sudafed (generic pseudoephedrine - Note this is the one that is available behind the pharmacy counter); Products with phenylephrine (-PE) may also be used but is often not as effective as pseudoephedrine.   If you have HIGH BLOOD PRESSURE - Coricidin HBP; AVOID any product that is -D as this contains pseudoephedrine which may increase your blood pressure.  Afrin (oxymetazoline) every 6-8 hours for up to 3 days.   Allergies - helps relieve runny nose, itchy eyes and sneezing   Claritin (generic loratidine), Allegra (fexofenidine), or Zyrtec (generic cyrterizine) for runny nose. These medications should not cause drowsiness.  Note - Benadryl (generic diphenhydramine) may be used however may cause drowsiness  Cough -   Delsym or Robitussin (generic dextromethorphan)  Expectorants - helps loosen mucus to ease removal   Mucinex (generic guaifenesin) as directed on the package.  Headaches / General Aches   Tylenol (generic acetaminophen) - DO NOT EXCEED 3 grams (3,000 mg) in a 24 hour time period  Advil/Motrin (generic ibuprofen)   Sore Throat -   Salt water gargle   Chloraseptic (generic benzocaine) spray or lozenges / Sucrets (generic  dyclonine)

## 2017-12-10 NOTE — Progress Notes (Signed)
Patient: Wayne Wolfe  DOB: 04-06-1966 MRN: [id number] PCP: Lars Mage, MD   Chief Complaint  Patient presents with  . Follow-up    cold since last Thursday      Patient Active Problem List   Diagnosis Date Noted  . Viral URI 12/10/2017  . Healthcare maintenance 10/16/2017  . Knee effusion, right 07/19/2017  . Atypical chest pain 03/20/2017  . Dizziness 03/13/2017  . Otitis media 03/13/2017  . Polyuria 03/13/2017  . Weight loss 03/13/2017  . Colon cancer screening 02/24/2016  . Testicular pain 01/20/2016  . Bilateral hand numbness 08/26/2013  . Elbow joint pain 08/26/2013  . 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 Diabetes type 2, uncontrolled ([place]) 03/12/2011  . Hyperlipidemia associated with type 2 diabetes mellitus ([place]) 03/12/2011  . Hypertension associated with diabetes ([place]) 04/20/2008  . Human immunodeficiency virus (HIV) disease ([place]) 04/15/2008     Subjective:  Wayne Wolfe is a 52 y.o. male with HIV infection (well controlled for many years).   He is here today for evaluation of some new symptoms concerning for cold. Symptoms presented last Thrusday including some heavy sweating at night, sore throat, nasal congestion, right ear fullness. Mild cough but no chest pain, shortness of breath and no changes to ADLs. Appetite is preserved and sleeping well. Eyes do have some drainage and have some itching. Has tried some mucinex with mild effect. He is wondering if he needs an antibiotic.   Review of Systems  Constitutional: Positive for diaphoresis. Negative for chills, fever and malaise/fatigue.  HENT: Positive for congestion and sore throat. Negative for tinnitus.   Eyes: Positive for discharge and redness. Negative for blurred vision and photophobia.  Respiratory: Positive for cough. Negative for sputum production, shortness of breath and wheezing.   Cardiovascular: Negative for chest pain.  Gastrointestinal: Negative for abdominal pain, diarrhea, nausea and  vomiting.  Genitourinary: Negative for dysuria.  Musculoskeletal: Negative for myalgias.  Skin: Negative for rash.  Neurological: Negative for headaches.    Past Medical History:  Diagnosis Date  . Dizziness 03/13/2017  . HIV (human immunodeficiency virus infection) ([place]) 2009   11/20/2012 Last CD4 count 210 and VL <20  . 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 Hypertension 2009   Well controlled on HCTZ  . Otitis media 03/13/2017  . Polyuria 03/13/2017  . Poorly controlled diabetes mellitus ([place]) 03/13/2017  . Type 2 diabetes mellitus (HCC)    Well controlled on metformin  . Weight loss 03/13/2017    Outpatient Medications Prior to Visit  Medication Sig Dispense Refill  . ACCU-CHEK FASTCLIX LANCETS MISC Check blood sugar before meals and after meals 102 each 12  . Blood Glucose Monitoring Suppl (ACCU-CHEK GUIDE) w/Device KIT 1 each by Does not apply route 6 (six) times daily. 1 kit 0  . glucose blood (ACCU-CHEK GUIDE) test strip Check blood sugar before meals and after meals 100 each 12  . hydrochlorothiazide (HYDRODIURIL) 25 MG tablet Take 1 tablet (25 mg total) by mouth daily. 30 tablet 2  . Insulin Pen Needle (PEN NEEDLES) 31G X 5 MM MISC 1 each by Does not apply route daily. 100 each 4  . metFORMIN (GLUCOPHAGE-XR) 500 MG 24 hr tablet Take 2 tablets (1,000 mg total) by mouth daily with breakfast. 120 tablet 11  . ODEFSEY 200-25-25 MG TABS tablet TAKE 1 TABLET BY MOUTH DAILY WITH BREAKFAST 30 tablet 3  . pravastatin (PRAVACHOL) 20 MG tablet TAKE 1 TABLET BY MOUTH EVERY DAY  30 tablet 5  . pravastatin (PRAVACHOL) 40 MG tablet Take 1 tablet (40 mg total) by mouth daily. 90 tablet 3  . VICTOZA 18 MG/3ML SOPN INJECT 0.6MG ONCE DAILY FOR THE FIRST WEEK, THEN INJECT 1.2MG DAILY THEREAFTER 3 mL 0  . dolutegravir (TIVICAY) 50 MG tablet Take 1 tablet (50 mg total) by mouth daily. This is study provided, may be placebo. Do not fill prescription. (Patient not taking: Reported on  07/25/2017) 30 tablet 11  . glipiZIDE (GLUCOTROL) 5 MG tablet Take 1 tablet (5 mg total) by mouth daily before breakfast. 30 tablet 2   No facility-administered medications prior to visit.      No Known Allergies  Social History   Tobacco Use  . Smoking status: Former Research scientist (life sciences)  . Smokeless tobacco: Never Used  Substance Use Topics  . Alcohol use: No    Alcohol/week: 0.0 oz    Comment: Last alcohol 10 years ago, former moderate use, never binge.   . Drug use: No    Family History  Problem Relation Age of Onset  . Diabetes Mellitus II Sister   . Diabetes Mellitus II Mother   . Coronary artery disease Mother 15       CABGx6  . Coronary artery disease Sister 73       CABG  . Diabetes Mellitus II Sister   . Coronary artery disease Sister   . Diabetes Mellitus II Sister     Objective:   Vitals:   12/10/17 1005  BP: 128/86  Pulse: 87  Temp: 97.7 F (36.5 C)  TempSrc: Oral  Weight: 252 lb (114.3 kg)   Body mass index is 40.67 kg/m.  Physical Exam  Constitutional: He is oriented to person, place, and time and well-developed, well-nourished, and in no distress.  HENT:  Right Ear: Tympanic membrane is not erythematous.  Left Ear: Ear canal normal. Tympanic membrane is not injected, not erythematous and not bulging. A middle ear effusion is present.  Nose: Mucosal edema and rhinorrhea present. Right sinus exhibits no maxillary sinus tenderness and no frontal sinus tenderness. Left sinus exhibits no maxillary sinus tenderness and no frontal sinus tenderness.  Mouth/Throat: Oropharynx is clear and moist. No oral lesions. Normal dentition. No dental caries.  Unable to fully visualize right TM d/t cerumen.   Eyes: Right eye exhibits chemosis. Left eye exhibits chemosis. No scleral icterus.  Watery discharge   Cardiovascular: Normal rate, regular rhythm and normal heart sounds.  Pulmonary/Chest: Effort normal and breath sounds normal.  Abdominal: Soft. He exhibits no  distension. There is no tenderness.  Lymphadenopathy:    He has no cervical adenopathy.  Neurological: He is alert and oriented to person, place, and time.  Skin: Skin is warm and dry. No rash noted.  Psychiatric: Mood and affect normal.  Vitals reviewed.  Lab Results: Lab Results  Component Value Date   WBC 4.0 08/10/2015   HGB 17.2 (H) 08/10/2015   HCT 50.7 08/10/2015   MCV 88.5 08/10/2015   PLT 323 08/10/2015    Lab Results  Component Value Date   CREATININE 1.05 07/25/2017   BUN 11 07/25/2017   NA 138 07/25/2017   K 3.9 07/25/2017   CL 99 07/25/2017   CO2 29 07/25/2017    Lab Results  Component Value Date   ALT 13 07/25/2017   AST <3 (L) 07/25/2017   ALKPHOS 65 03/13/2017   BILITOT 0.6 07/25/2017     Assessment & Plan:   Problem List Items Addressed This  Visit      Respiratory   Viral URI    Seems to be getting better. No fevers, wheezing, cough present today. Middle ear effusion present on the left without purulence/erythema of TM. I suspect he has a component of allergies that are contributing to his symptoms. Will have him start taking some Allegra. Advised he can use over the counter Flonase for symptoms with sparing short term decongestants (he has hypertension but currently under good control). No indication for antibiotic today. Advised to continue with supportive care with OTC medications and rest. Will call back if he has increase in severity of symptoms.         Other   Human immunodeficiency virus (HIV) disease ([place]) - Primary (Chronic)    HIV 1 RNA Quant (copies/mL)  Date Value  12/07/2013 <20  07/06/2013 <20  04/16/2013 <20   HIV-1 RNA Viral Load (no units)  Date Value  07/25/2017 <40  01/15/2017 <40  08/09/2016 <40   CD4 (no units)  Date Value  08/09/2016 370  07/26/2016 444  02/16/2016 394   Continue follow up as previously scheduled.         Janene Madeira, MSN, NP-C [facility] for Infectious [place] Pager: [phone] Office: [phone]  12/10/17  3:48 PM

## 2017-12-16 LAB — HIV-1 RNA QUANT-NO REFLEX-BLD: HIV-1 RNA Viral Load: <40

## 2017-12-22 ENCOUNTER — Emergency Department (HOSPITAL_COMMUNITY): Payer: Self-pay

## 2017-12-22 ENCOUNTER — Emergency Department (HOSPITAL_COMMUNITY)
Admission: EM | Admit: 2017-12-22 | Discharge: 2017-12-22 | Disposition: A | Discharge status: Home / Self Care | Payer: Self-pay | Attending: Emergency Medicine | Admitting: Emergency Medicine

## 2017-12-22 ENCOUNTER — Other Ambulatory Visit: Payer: Self-pay

## 2017-12-22 ENCOUNTER — Encounter (HOSPITAL_COMMUNITY): Payer: Self-pay | Admitting: Emergency Medicine

## 2017-12-22 DIAGNOSIS — R079 Chest pain, unspecified: Secondary | ICD-10-CM

## 2017-12-22 DIAGNOSIS — Z794 Long term (current) use of insulin: Secondary | ICD-10-CM | POA: Insufficient documentation

## 2017-12-22 DIAGNOSIS — Z79899 Other long term (current) drug therapy: Secondary | ICD-10-CM | POA: Insufficient documentation

## 2017-12-22 DIAGNOSIS — E119 Type 2 diabetes mellitus without complications: Secondary | ICD-10-CM | POA: Insufficient documentation

## 2017-12-22 DIAGNOSIS — R911 Solitary pulmonary nodule: Secondary | ICD-10-CM | POA: Insufficient documentation

## 2017-12-22 DIAGNOSIS — I10 Essential (primary) hypertension: Secondary | ICD-10-CM | POA: Insufficient documentation

## 2017-12-22 DIAGNOSIS — Z21 Asymptomatic human immunodeficiency virus [HIV] infection status: Secondary | ICD-10-CM | POA: Insufficient documentation

## 2017-12-22 DIAGNOSIS — R0789 Other chest pain: Secondary | ICD-10-CM | POA: Insufficient documentation

## 2017-12-22 DIAGNOSIS — Z87891 Personal history of nicotine dependence: Secondary | ICD-10-CM | POA: Insufficient documentation

## 2017-12-22 LAB — CBC
HCT: 50.1 % (ref 39.0–52.0)
HEMOGLOBIN: 16.3 g/dL (ref 13.0–17.0)
MCH: 28.4 pg (ref 26.0–34.0)
MCHC: 32.5 g/dL (ref 30.0–36.0)
MCV: 87.4 fL (ref 78.0–100.0)
PLATELETS: 358 10*3/uL (ref 150–400)
RBC: 5.73 MIL/uL (ref 4.22–5.81)
RDW: 13.4 % (ref 11.5–15.5)
WBC: 6 10*3/uL (ref 4.0–10.5)

## 2017-12-22 LAB — BASIC METABOLIC PANEL
Anion gap: 15 (ref 5–15)
BUN: 8 mg/dL (ref 6–20)
CALCIUM: 9.2 mg/dL (ref 8.9–10.3)
CHLORIDE: 98 mmol/L — AB (ref 101–111)
CO2: 22 mmol/L (ref 22–32)
CREATININE: 1.25 mg/dL — AB (ref 0.61–1.24)
GFR calc non Af Amer: >60 mL/min (ref >60)
Glucose, Bld: 135 mg/dL — ABNORMAL HIGH (ref 65–99)
Potassium: 3.3 mmol/L — ABNORMAL LOW (ref 3.5–5.1)
SODIUM: 135 mmol/L (ref 135–145)

## 2017-12-22 LAB — I-STAT TROPONIN, ED
TROPONIN I, POC: 0.01 ng/mL (ref 0.00–0.08)
TROPONIN I, POC: 0 ng/mL (ref 0.00–0.08)

## 2017-12-22 MED ORDER — IOPAMIDOL (ISOVUE-370) INJECTION 76%
INTRAVENOUS | Status: AC
  Administered 2017-12-22: 100 mL
  Filled 2017-12-22: qty 100

## 2017-12-22 NOTE — ED Triage Notes (Signed)
Pt brought to ED by [facility] from home for central cp radiating to left arm.  No cardiac hx. Pt got 324 mg ASA and one nitro sl with no change on pain. SR on EMS EKG  BP 148/76, HR 100, R 18 SPO2 97% CBG 102.

## 2017-12-22 NOTE — ED Notes (Signed)
Nurse will draw labs. 

## 2017-12-22 NOTE — Discharge Instructions (Signed)
You were seen in the emergency department today for chest pain.  Your workup in the emergency department was reassuring.  The enzyme we used to check your heart was normal.  The CT scan of your chest did not show signs of a blood clot in your lungs.  There was an incidental finding of a pulmonary nodule, as discussed this will need repeat imaging in 6-12 months.  Please discussed the need to repeat imaging with your primary care provider.  Your labs were all reassuring.  Your potassium was slightly low at 3.3, please be sure to include potassium rich foods in your diet such as bananas, avocados, and vegetables.  Have this rechecked by your primary care doctor.  We are unsure of the exact cause of your pain at this time.  With this being said it is important that you follow-up with your primary care doctor in the next 3 days for reevaluation and further management.  Return to the emergency department anytime for any new or worsening symptoms including but not limited to worsening pain, trouble breathing, or any other concerns.

## 2017-12-22 NOTE — ED Provider Notes (Signed)
Patient presented to the ER with chest pain.  He has been experiencing intermittent episodes of sharp pain in the left side of his chest that occur randomly.  He reports that it usually lasts only 30 or 40 seconds and then resolves.  It is not related to exertion.  He is not short of breath.  Face to face Exam: HEENT - PERRLA Lungs - CTAB Heart - RRR, no M/R/G Abd - S/NT/ND Neuro - alert, oriented x3  Plan: Patient does have multiple cardiac risk factors, however, symptoms are extremely atypical and not felt to be likely cardiac in nature.  EKG nonspecific, no obvious ischemia.  Troponin negative x2..  Patient tachycardic at arrival, will perform CT angiography to rule out PE.  If CT negative patient will be referred back to his primary care at internal medicine here at [facility].   Orpah Greek, MD 12/22/17 [phone]

## 2017-12-22 NOTE — ED Notes (Signed)
Declined W/C at D/C and was escorted to lobby by RN. 

## 2017-12-22 NOTE — ED Provider Notes (Signed)
[place] EMERGENCY DEPARTMENT Provider Note   CSN: [id number] Arrival date & time: 12/22/17  0051   History   Chief Complaint Chief Complaint  Patient presents with  . Chest Pain    HPI Wayne Wolfe is a 52 y.o. male with a hx of HTN, DM, hyperlipidemia, and HIV who arrives to the ED via EMS complaining of intermittent chest pain for the past 2 days. Patient states pain is sharp in character and primarily located in the left chest, state he noted it in the LUE as well. Reports pain is primarily occurring with coughing and/or with bending forward and certain movements, however can occur spontaneously. Not pleuritic or exertional. Pain will last for 30-40 seconds and then resolve without intervention. States yesterday throughout the day he noted some dyspnea, none today. At present patient states he is having minimal discomfort. Denies diaphoresis, nausea, vomiting, lightheadedness, syncope, leg pain/swelling, hemoptysis, recent surgery/trauma, recent long travel, hormone use, personal hx of cancer, or hx of DVT/PE.   HPI  Past Medical History:  Diagnosis Date  . Dizziness 03/13/2017  . HIV (human immunodeficiency virus infection) ([place]) 2009   11/20/2012 Last CD4 count 210 and VL <20  . 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 Hypertension 2009   Well controlled on HCTZ  . Otitis media 03/13/2017  . Polyuria 03/13/2017  . Poorly controlled diabetes mellitus ([place]) 03/13/2017  . Type 2 diabetes mellitus (HCC)    Well controlled on metformin  . Weight loss 03/13/2017    Patient Active Problem List   Diagnosis Date Noted  . Viral URI 12/10/2017  . Healthcare maintenance 10/16/2017  . Knee effusion, right 07/19/2017  . Atypical chest pain 03/20/2017  . Dizziness 03/13/2017  . Otitis media 03/13/2017  . Polyuria 03/13/2017  . Weight loss 03/13/2017  . Colon cancer screening 02/24/2016  . Testicular pain 01/20/2016  . Bilateral hand numbness  08/26/2013  . Elbow joint pain 08/26/2013  . 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 Diabetes type 2, uncontrolled ([place]) 03/12/2011  . Hyperlipidemia associated with type 2 diabetes mellitus ([place]) 03/12/2011  . Hypertension associated with diabetes ([place]) 04/20/2008  . Human immunodeficiency virus (HIV) disease ([place]) 04/15/2008    Past Surgical History:  Procedure Laterality Date  . CARPAL TUNNEL RELEASE Right 1990s  . none          Home Medications    Prior to Admission medications   Medication Sig Start Date End Date Taking? Authorizing Provider  glipiZIDE (GLUCOTROL) 5 MG tablet Take 1 tablet (5 mg total) by mouth daily before breakfast. 10/18/17 12/22/25 Yes Chundi, Vahini, MD  hydrochlorothiazide (HYDRODIURIL) 25 MG tablet Take 1 tablet (25 mg total) by mouth daily. 12/10/17 01/09/18 Yes Chundi, Verne Spurr, MD  metFORMIN (GLUCOPHAGE-XR) 500 MG 24 hr tablet Take 2 tablets (1,000 mg total) by mouth daily with breakfast. 10/24/17  Yes Lars Mage, MD  ODEFSEY 200-25-25 MG TABS tablet TAKE 1 TABLET BY MOUTH DAILY WITH BREAKFAST 08/26/17  Yes Tommy Medal, Lavell Islam, MD  pravastatin (PRAVACHOL) 40 MG tablet Take 1 tablet (40 mg total) by mouth daily. 03/25/17  Yes Patel, Rayne Du, MD  VICTOZA 18 MG/3ML SOPN INJECT 0.6MG ONCE DAILY FOR THE FIRST WEEK, THEN INJECT 1.2MG DAILY THEREAFTER Patient taking differently: INJECT 1.2MG DAILY 11/14/17  Yes Chundi, Vahini, MD  ACCU-CHEK FASTCLIX LANCETS MISC Check blood sugar before meals and after meals 10/24/17   Chundi, Verne Spurr, MD  Blood Glucose Monitoring Suppl (ACCU-CHEK GUIDE) w/Device KIT  1 each by Does not apply route 6 (six) times daily. 10/24/17   Chundi, Verne Spurr, MD  dolutegravir (TIVICAY) 50 MG tablet Take 1 tablet (50 mg total) by mouth daily. This is study provided, may be placebo. Do not fill prescription. Patient not taking: Reported on 07/25/2017 09/13/15   Tommy Medal, Lavell Islam, MD  glucose blood (ACCU-CHEK GUIDE) test strip Check blood sugar before meals  and after meals 10/24/17   Chundi, Verne Spurr, MD  Insulin Pen Needle (PEN NEEDLES) 31G X 5 MM MISC 1 each by Does not apply route daily. 10/24/17   Lars Mage, MD  pravastatin (PRAVACHOL) 20 MG tablet TAKE 1 TABLET BY MOUTH EVERY DAY Patient not taking: Reported on 12/22/2017 06/24/17   Tommy Medal, Lavell Islam, MD    Family History Family History  Problem Relation Age of Onset  . Diabetes Mellitus II Sister   . Diabetes Mellitus II Mother   . Coronary artery disease Mother 49       CABGx6  . Coronary artery disease Sister 7       CABG  . Diabetes Mellitus II Sister   . Coronary artery disease Sister   . Diabetes Mellitus II Sister     Social History Social History   Tobacco Use  . Smoking status: Former Research scientist (life sciences)  . Smokeless tobacco: Never Used  Substance Use Topics  . Alcohol use: No    Alcohol/week: 0.0 oz    Comment: Last alcohol 10 years ago, former moderate use, never binge.   . Drug use: No     Allergies   Patient has no known allergies.   Review of Systems Review of Systems  Constitutional: Negative for chills and fever.  HENT: Negative for congestion, ear pain, rhinorrhea and sore throat.   Respiratory: Positive for cough (mild dry) and shortness of breath.        Negative for hemoptysis   Cardiovascular: Positive for chest pain. Negative for palpitations and leg swelling.  Gastrointestinal: Negative for abdominal pain, nausea and vomiting.  Neurological: Negative for syncope.  All other systems reviewed and are negative.  Physical Exam Updated Vital Signs BP 120/88 (BP Location: Left Arm)   Pulse (!) 111   Temp 98.2 F (36.8 C) (Oral)   Resp 16   Wt 114.3 kg (252 lb)   SpO2 98%   BMI 40.67 kg/m   Physical Exam  Constitutional: He appears well-developed and well-nourished. No distress.  HENT:  Head: Normocephalic and atraumatic.  Eyes: Conjunctivae are normal. Right eye exhibits no discharge. Left eye exhibits no discharge.  Cardiovascular: Normal  rate and regular rhythm.  No murmur heard. Pulses:      Radial pulses are 2+ on the right side, and 2+ on the left side.  Pulmonary/Chest: Effort normal and breath sounds normal. No respiratory distress. He has no decreased breath sounds. He has no wheezes. He has no rales. He exhibits no tenderness, no crepitus and no edema.  Abdominal: Soft. He exhibits no distension. There is no tenderness.  Musculoskeletal: He exhibits no edema or tenderness.  Neurological: He is alert.  Clear speech.   Skin: Skin is warm and dry. No rash noted.  Psychiatric: He has a normal mood and affect. His behavior is normal.  Nursing note and vitals reviewed.   ED Treatments / Results  Labs Results for orders placed or performed during the hospital encounter of 23/76/28  Basic metabolic panel  Result Value Ref Range   Sodium 135 135 - 145 mmol/L  Potassium 3.3 (L) 3.5 - 5.1 mmol/L   Chloride 98 (L) 101 - 111 mmol/L   CO2 22 22 - 32 mmol/L   Glucose, Bld 135 (H) 65 - 99 mg/dL   BUN 8 6 - 20 mg/dL   Creatinine, Ser 1.25 (H) 0.61 - 1.24 mg/dL   Calcium 9.2 8.9 - 10.3 mg/dL   GFR calc non Af Amer >60 >60 mL/min   GFR calc Af Amer >60 >60 mL/min   Anion gap 15 5 - 15  CBC  Result Value Ref Range   WBC 6.0 4.0 - 10.5 K/uL   RBC 5.73 4.22 - 5.81 MIL/uL   Hemoglobin 16.3 13.0 - 17.0 g/dL   HCT 50.1 39.0 - 52.0 %   MCV 87.4 78.0 - 100.0 fL   MCH 28.4 26.0 - 34.0 pg   MCHC 32.5 30.0 - 36.0 g/dL   RDW 13.4 11.5 - 15.5 %   Platelets 358 150 - 400 K/uL  I-stat troponin, ED  Result Value Ref Range   Troponin i, poc 0.01 0.00 - 0.08 ng/mL   Comment 3          I-stat troponin, ED  Result Value Ref Range   Troponin i, poc 0.00 0.00 - 0.08 ng/mL   Comment 3           EKG EKG Interpretation  Date/Time:  Sunday December 22 2017 00:56:01 EDT Ventricular Rate:  110 PR Interval:  188 QRS Duration: 72 QT Interval:  314 QTC Calculation: 424 R Axis:   39 Text Interpretation:  Sinus tachycardia Septal  infarct , age undetermined T wave abnormality, consider inferior ischemia Abnormal ECG Confirmed by Orpah Greek [phone]) on 12/22/2017 6:23:14 AM   Radiology Dg Chest 2 View  Result Date: 12/22/2017 CLINICAL DATA:  Chest pain EXAM: CHEST - 2 VIEW COMPARISON:  In chest radiograph 04/04/2008 FINDINGS: The heart size and mediastinal contours are within normal limits. Both lungs are clear. The visualized skeletal structures are unremarkable. IMPRESSION: No active cardiopulmonary disease. Electronically Signed   By: Ulyses Jarred M.D.   On: 12/22/2017 01:50   Ct Angio Chest Pe W/cm &/or Wo Cm  Result Date: 12/22/2017 CLINICAL DATA:  Central chest pain radiating to the left arm. EXAM: CT ANGIOGRAPHY CHEST WITH CONTRAST TECHNIQUE: Multidetector CT imaging of the chest was performed using the standard protocol during bolus administration of intravenous contrast. Multiplanar CT image reconstructions and MIPs were obtained to evaluate the vascular anatomy. CONTRAST:  128m ISOVUE-370 IOPAMIDOL (ISOVUE-370) INJECTION 76% COMPARISON:  Chest radiographs obtained earlier today. FINDINGS: Cardiovascular: Satisfactory opacification of the pulmonary arteries to the segmental level. No evidence of pulmonary embolism. Normal heart size. No pericardial effusion. Mediastinum/Nodes: No enlarged mediastinal, hilar, or axillary lymph nodes. Thyroid gland, trachea, and esophagus demonstrate no significant findings. Lungs/Pleura: 7 mm right lower lobe nodule on image number 63 series 8. No airspace consolidation or pleural fluid. Upper Abdomen: Unremarkable. Musculoskeletal: Thoracic spine degenerative changes. Review of the MIP images confirms the above findings. IMPRESSION: 1. No pulmonary emboli or acute abnormality. 2. **An incidental finding of potential clinical significance has been found. There is a 7 mm right lower lobe pulmonary nodule. Non-contrast chest CT at 6-12 months is recommended. If the nodule is stable  at time of repeat CT, then future CT at 18-24 months (from today's scan) is considered optional for low-risk patients, but is recommended for high-risk patients. This recommendation follows the consensus statement: Guidelines for Management of Incidental Pulmonary Nodules Detected on  CT Images: From the Fleischner Society 2017; Radiology 2017; [phone].** Electronically Signed   By: Claudie Revering M.D.   On: 12/22/2017 08:45   Procedures Procedures (including critical care time)  Medications Ordered in ED Medications - No data to display   Initial Impression / Assessment and Plan / ED Course  I have reviewed the triage vital signs and the nursing notes.  Pertinent labs & imaging results that were available during my care of the patient were reviewed by me and considered in my medical decision making (see chart for details).   Patient presents with complaint of chest pain. He is nontoxic appearing, in no apparent distress, noted to be intermittently tachycardic, vitals otherwise WNL. Chest pain work up initiated per triage protocol reviewed. Labs fairly unremarkable, potassium slightly low at 3.3- recommended diet supplementation. Creatinine 1.25 at baseline. No leukocytosis or anemia.   Patient with multiple cardiac risk factors, his EKG is without obvious ischemia, troponin negative x 2 in the ED, given atypical nature of discomfort, doubt ACS. With intermittent tachycardia CTA chest ordered and negative for pulmonary embolism- there was note of an incidental pulmonary nodule with recommended repeat imaging- this was discussed with the patient. Patient's pulses are 2+ and symmetric, no widening of mediastinum on imaging, doubt dissection. No pneumonia identified. Unclear definitive etiology to patient's pain at this time. Will have patient follow up with primary care. I discussed results, treatment plan, need for PCP follow-up, and return precautions with the patient and family at bedside. Provided  opportunity for questions, patient and family confirmed understanding and are in agreement with plan.     Findings and plan of care discussed with supervising physician Dr. Betsey Holiday who personally evaluated and examined this patient and is in agreement with plan.   Vitals:   12/22/17 0815 12/22/17 0845  BP: 113/69 123/75  Pulse: 88 84  Resp: 15 15  Temp:    SpO2: 95% 96%    Final Clinical Impressions(s) / ED Diagnoses   Final diagnoses:  Chest pain, unspecified type  Pulmonary nodule    ED Discharge Orders    None       Amaryllis Dyke, PA-C 12/22/17 0955    Orpah Greek, MD 12/29/17 [phone]

## 2017-12-23 ENCOUNTER — Encounter: Payer: Self-pay | Admitting: *Deleted

## 2017-12-24 ENCOUNTER — Encounter: Payer: Self-pay | Admitting: *Deleted

## 2017-12-25 ENCOUNTER — Other Ambulatory Visit: Payer: Self-pay | Admitting: Internal Medicine

## 2017-12-25 ENCOUNTER — Other Ambulatory Visit: Payer: Self-pay | Admitting: Infectious Disease

## 2017-12-25 DIAGNOSIS — B20 Human immunodeficiency virus [HIV] disease: Secondary | ICD-10-CM

## 2017-12-25 DIAGNOSIS — E1165 Type 2 diabetes mellitus with hyperglycemia: Secondary | ICD-10-CM

## 2017-12-25 DIAGNOSIS — E785 Hyperlipidemia, unspecified: Secondary | ICD-10-CM

## 2017-12-26 ENCOUNTER — Other Ambulatory Visit: Payer: Self-pay

## 2017-12-26 ENCOUNTER — Ambulatory Visit (INDEPENDENT_AMBULATORY_CARE_PROVIDER_SITE_OTHER): Payer: Self-pay | Admitting: Internal Medicine

## 2017-12-26 DIAGNOSIS — R911 Solitary pulmonary nodule: Secondary | ICD-10-CM

## 2017-12-26 DIAGNOSIS — Z87891 Personal history of nicotine dependence: Secondary | ICD-10-CM

## 2017-12-26 NOTE — Patient Instructions (Addendum)
FOLLOW-UP INSTRUCTIONS When: next available with Dr Maricela Bo For: follow up diabetes, HTN, pulmonary nodule What to bring: current medication  You have a nodule in your lungs that was seen on the CT scan during your ER visit.  This is probably just a incidental finding from the imaging test but we want to repeat a CT scan in 6 months to make sure it is not changing.  Dr Maricela Bo will help arrange this.   Take care.

## 2017-12-26 NOTE — Assessment & Plan Note (Signed)
Patient was seen in the ED for chest pain recently.  He had thorough evaluation there including labs and imaging.  His chest pain was felt to be atypical and he was discharged.  Today, he does not have any pain.  He will occasionally notice a sharp pain in his left chest but also his right chest associated with coughing or lifting objects.  It is not exertional or associated with stress.  On his CTA, there was an incidental finding of a pulmonary nodule and he presented to clinic today in order to have this further explained to him.  There was a 7 mm right lower lobe pulmonary nodule. Non-contrast chest CT at 6-12 months is recommended. If the nodule is stable at time of repeat CT, then future CT at 18-24 months (from today's scan) is considered optional for low-risk patients, but is recommended for high-risk patients.  Patient has no significant tobacco history and last smoked 15 years ago.  Prior to that, he was just a social smoker and never smoked on a daily basis.  Plan: - Needs follow up CT scan in 6-12 months.  Will route to patients PCP for continued follow up.

## 2017-12-26 NOTE — Telephone Encounter (Signed)
In regards to: Too soon for Metformin; 120 tabs with 11 refills sent 10/24/2017    Reply: Ander Purpura, unfortunately I am not seeing this. The last refill for metformin I am seeing that I gave for Mr. Ledger was 2 months ago. Please correct me if I am wrong

## 2017-12-26 NOTE — Progress Notes (Signed)
   CC: pulmonary nodule  HPI:  [name] is a 52 y.o. man with a past medical history listed below here today for follow up of his pulmonary nodule.   For details of today's visit and the status of his chronic medical issues please refer to the assessment and plan.   Past Medical History:  Diagnosis Date  . Dizziness 03/13/2017  . HIV (human immunodeficiency virus infection) ([place]) 2009   11/20/2012 Last CD4 count 210 and VL <20  . 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 Hypertension 2009   Well controlled on HCTZ  . Otitis media 03/13/2017  . Polyuria 03/13/2017  . Poorly controlled diabetes mellitus ([place]) 03/13/2017  . Type 2 diabetes mellitus (HCC)    Well controlled on metformin  . Weight loss 03/13/2017   Review of Systems:  Review of Systems  Constitutional: Negative for chills and fever.  Respiratory: Positive for cough. Negative for sputum production and shortness of breath.   Cardiovascular: Positive for chest pain.       With coughing and moving his arms.   Gastrointestinal: Negative for abdominal pain, heartburn, nausea and vomiting.     Physical Exam:  Vitals:   12/26/17 1025  BP: 118/73  Pulse: 88  Temp: 97.9 F (36.6 C)  TempSrc: Oral  SpO2: 98%  Weight: 251 lb 9.6 oz (114.1 kg)  Height: 5\' 6"  (1.676 m)   Physical Exam  Constitutional: He is oriented to person, place, and time and well-developed, well-nourished, and in no distress.  Cardiovascular: Normal rate, regular rhythm and normal heart sounds.  Pulmonary/Chest: Effort normal and breath sounds normal.  Neurological: He is alert and oriented to person, place, and time.  Skin: Skin is warm and dry.  Psychiatric: Mood and affect normal.     Assessment & Plan:   See Encounters Tab for problem based charting.  Patient discussed with Dr. Dareen Piano.  Pulmonary nodule Patient was seen in the ED for chest pain recently.  He had thorough evaluation there including labs and  imaging.  His chest pain was felt to be atypical and he was discharged.  Today, he does not have any pain.  He will occasionally notice a sharp pain in his left chest but also his right chest associated with coughing or lifting objects.  It is not exertional or associated with stress.  On his CTA, there was an incidental finding of a pulmonary nodule and he presented to clinic today in order to have this further explained to him.  There was a 7 mm right lower lobe pulmonary nodule. Non-contrast chest CT at 6-12 months is recommended. If the nodule is stable at time of repeat CT, then future CT at 18-24 months (from today's scan) is considered optional for low-risk patients, but is recommended for high-risk patients.  Patient has no significant tobacco history and last smoked 15 years ago.  Prior to that, he was just a social smoker and never smoked on a daily basis.  Plan: - Needs follow up CT scan in 6-12 months.  Will route to patients PCP for continued follow up.

## 2017-12-27 NOTE — Progress Notes (Signed)
Internal Medicine Clinic Attending  Case discussed with Dr. Wallace at the time of the visit.  We reviewed the resident's history and exam and pertinent patient test results.  I agree with the assessment, diagnosis, and plan of care documented in the resident's note.  

## 2018-01-27 ENCOUNTER — Other Ambulatory Visit: Payer: Self-pay | Admitting: Internal Medicine

## 2018-01-27 DIAGNOSIS — E1165 Type 2 diabetes mellitus with hyperglycemia: Secondary | ICD-10-CM

## 2018-02-28 ENCOUNTER — Other Ambulatory Visit: Payer: Self-pay | Admitting: Internal Medicine

## 2018-02-28 DIAGNOSIS — E1165 Type 2 diabetes mellitus with hyperglycemia: Secondary | ICD-10-CM

## 2018-04-05 ENCOUNTER — Other Ambulatory Visit: Payer: Self-pay | Admitting: Internal Medicine

## 2018-04-05 DIAGNOSIS — E1165 Type 2 diabetes mellitus with hyperglycemia: Secondary | ICD-10-CM

## 2018-04-16 ENCOUNTER — Encounter: Payer: Self-pay | Admitting: *Deleted

## 2018-04-16 ENCOUNTER — Ambulatory Visit (INDEPENDENT_AMBULATORY_CARE_PROVIDER_SITE_OTHER): Payer: Self-pay

## 2018-04-16 ENCOUNTER — Encounter: Payer: Self-pay | Admitting: Infectious Disease

## 2018-04-16 VITALS — BP 115/80 | HR 78 | Temp 97.8°F | Wt 247.2 lb

## 2018-04-16 DIAGNOSIS — Z006 Encounter for examination for normal comparison and control in clinical research program: Secondary | ICD-10-CM

## 2018-04-16 NOTE — Progress Notes (Signed)
[place] is here for his week 288 visit for A5322. No new complaints or concerns. Has an appointment to see Dr. Tommy Medal in September. Will return in January for his next study visit.

## 2018-04-17 LAB — COMPREHENSIVE METABOLIC PANEL
AG Ratio: 1.3 (calc) (ref 1.0–2.5)
ALBUMIN MSPROF: 3.9 g/dL (ref 3.6–5.1)
ALKALINE PHOSPHATASE (APISO): 64 U/L (ref 40–115)
ALT: 14 U/L (ref 9–46)
AST: 8 U/L — ABNORMAL LOW (ref 10–35)
BILIRUBIN TOTAL: 0.4 mg/dL (ref 0.2–1.2)
BUN: 13 mg/dL (ref 7–25)
CALCIUM: 9.2 mg/dL (ref 8.6–10.3)
CO2: 28 mmol/L (ref 20–32)
CREATININE: 1.22 mg/dL (ref 0.70–1.33)
Chloride: 99 mmol/L (ref 98–110)
Globulin: 2.9 g/dL (calc) (ref 1.9–3.7)
Glucose, Bld: 240 mg/dL — ABNORMAL HIGH (ref 65–99)
POTASSIUM: 3.8 mmol/L (ref 3.5–5.3)
Sodium: 138 mmol/L (ref 135–146)
Total Protein: 6.8 g/dL (ref 6.1–8.1)

## 2018-04-17 LAB — PROTEIN / CREATININE RATIO, URINE
Creatinine, Urine: 289 mg/dL (ref 20–320)
Protein/Creat Ratio: 135 mg/g creat — ABNORMAL HIGH (ref 22–128)
Total Protein, Urine: 39 mg/dL — ABNORMAL HIGH (ref 5–25)

## 2018-04-17 LAB — HEPATITIS C ANTIBODY
Hepatitis C Ab: NONREACTIVE
SIGNAL TO CUT-OFF: 0.02 (ref <1.00)

## 2018-04-17 LAB — LIPID PANEL
Cholesterol: 142 mg/dL (ref <200)
HDL: 26 mg/dL — ABNORMAL LOW (ref >40)
LDL Cholesterol (Calc): 93 mg/dL (calc)
Non-HDL Cholesterol (Calc): 116 mg/dL (calc) (ref <130)
Total CHOL/HDL Ratio: 5.5 (calc) — ABNORMAL HIGH (ref <5.0)
Triglycerides: 136 mg/dL (ref <150)

## 2018-04-17 LAB — HEMOGLOBIN A1C
HEMOGLOBIN A1C: 10.4 %{Hb} — AB (ref <5.7)
MEAN PLASMA GLUCOSE: 252 (calc)
eAG (mmol/L): 13.9 (calc)

## 2018-04-29 ENCOUNTER — Encounter: Payer: Self-pay | Admitting: Infectious Disease

## 2018-04-30 ENCOUNTER — Encounter: Payer: Self-pay | Admitting: *Deleted

## 2018-04-30 LAB — HIV RNA, QUANTITATIVE, PCR
CD4 Count: 463
CD4%: 18.5
CD8 T CELL SUPPRESSOR: 60.9
CD8: 1523
HIV 1 RNA Quant: <40

## 2018-05-02 ENCOUNTER — Encounter: Payer: Self-pay | Admitting: Internal Medicine

## 2018-05-30 ENCOUNTER — Encounter: Payer: Self-pay | Admitting: Internal Medicine

## 2018-05-30 NOTE — Progress Notes (Deleted)
   CC: ***  HPI:  [name] is a 52 y.o. person living with HIV, hypertension, diabetes mellitus type 2, and hyperlipidemia who presents for hypertension follow-up.   His blood pressure during this visit was***the patient is currently taking hydrochlorthiazide 25 mg daily.  He has been compliant with his medications***.  His weight has***.  Assessment and plan The patient's current blood pressure meets current ACC and AHA guidelines of <130/80.  Therefore, will encourage the patient to continue taking hydrochlorothiazide 25 mg daily.  Diabetes mellitus The patient's last a1c=10.4 in July 2019.  The patient's home blood glucose measurements over the past month have ranged ***. The patient does/does not note episodes of hypoglycemia.  The patient is currently taking metformin 1000mg  daily, glipizide 5 mg daily, Victoza 1.2 mg daily. He/yes*** or she is compliant with medication.    Patient's weight changes ***  Assessment and plan  HIV The patient is currently taking Odefsey daily.  His last CD4 count was 164 and quantitative RNA was undetectable.  His last visit withinfectious disease was in January 2019 during which time  Pulmonary nodule There is an incidental pulmonary nodule found on his CTA in 2019 that was 7 mm in size located in the right lower lobe.  Assessment and plan The patient is due for a repeat noncontrast CT between September 2019-March 2020.     Healthcare maintenance Patient received influenza vaccine during this clinic visit Tdap vaccine Colonoscopy referral        Past Medical History:  Diagnosis Date  . Dizziness 03/13/2017  . HIV (human immunodeficiency virus infection) ([place]) 2009   11/20/2012 Last CD4 count 210 and VL <20  . 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 Hypertension 2009   Well controlled on HCTZ  . Otitis media 03/13/2017  . Polyuria 03/13/2017  . Poorly controlled diabetes mellitus ([place]) 03/13/2017  . Type  2 diabetes mellitus (HCC)    Well controlled on metformin  . Weight loss 03/13/2017   Review of Systems:  ***  Physical Exam:  There were no vitals filed for this visit. ***  Assessment & Plan:   See Encounters Tab for problem based charting.  Patient {GC/GE:3044014::"discussed with","seen with"} Dr. {NAMES:[id number]::"Butcher","Granfortuna","E. Hoffman","Klima","Mullen","Narendra","Raines","Vincent"}

## 2018-06-03 ENCOUNTER — Other Ambulatory Visit: Payer: Self-pay | Admitting: Internal Medicine

## 2018-06-03 DIAGNOSIS — E1165 Type 2 diabetes mellitus with hyperglycemia: Secondary | ICD-10-CM

## 2018-06-17 ENCOUNTER — Other Ambulatory Visit: Payer: Self-pay | Admitting: Internal Medicine

## 2018-06-17 DIAGNOSIS — E1165 Type 2 diabetes mellitus with hyperglycemia: Secondary | ICD-10-CM

## 2018-06-17 NOTE — Telephone Encounter (Signed)
rec'd request for refill of glipizide, after reviewing it was noted that 03/03/2018 dr Maricela Bo sent an electronic refill for #90 w/ 1 additional refill, this was 38mon of med. Called pharm and spoke to a pharmacist, she stated pt picked up #90 on 03/05/18 and then the next #90 on 04/04/18. She states she does not know why pharmacist filled both 1 month apart. Attempted to call pt to ask how he had been taking med and if he did pick up both refills, lm for rtc

## 2018-06-17 NOTE — Telephone Encounter (Signed)
Pt rtc and states he has been taking 1 tab daily as prescribed, states he did not pick up the refill on 7/5, he is going to pharmacy and discuss with them

## 2018-06-18 NOTE — Telephone Encounter (Signed)
Thank you for following up on this Bonnita Nasuti!

## 2018-06-19 ENCOUNTER — Other Ambulatory Visit: Payer: Self-pay | Admitting: Internal Medicine

## 2018-06-19 ENCOUNTER — Ambulatory Visit (INDEPENDENT_AMBULATORY_CARE_PROVIDER_SITE_OTHER): Payer: Self-pay | Admitting: Family

## 2018-06-19 ENCOUNTER — Encounter: Payer: Self-pay | Admitting: Family

## 2018-06-19 VITALS — BP 123/88 | HR 83 | Temp 98.6°F | Wt 244.4 lb

## 2018-06-19 DIAGNOSIS — B20 Human immunodeficiency virus [HIV] disease: Secondary | ICD-10-CM

## 2018-06-19 DIAGNOSIS — J301 Allergic rhinitis due to pollen: Secondary | ICD-10-CM

## 2018-06-19 DIAGNOSIS — E785 Hyperlipidemia, unspecified: Secondary | ICD-10-CM

## 2018-06-19 DIAGNOSIS — Z23 Encounter for immunization: Secondary | ICD-10-CM

## 2018-06-19 DIAGNOSIS — F4321 Adjustment disorder with depressed mood: Secondary | ICD-10-CM | POA: Insufficient documentation

## 2018-06-19 DIAGNOSIS — E1169 Type 2 diabetes mellitus with other specified complication: Secondary | ICD-10-CM

## 2018-06-19 DIAGNOSIS — E1165 Type 2 diabetes mellitus with hyperglycemia: Secondary | ICD-10-CM

## 2018-06-19 MED ORDER — FEXOFENADINE HCL 180 MG PO TABS: 180.0000 mg | ORAL_TABLET | Freq: Every day | ORAL | 2 refills | Status: DC

## 2018-06-19 MED ORDER — EMTRICITAB-RILPIVIR-TENOFOV AF 200-25-25 MG PO TABS: 1.0000 | ORAL_TABLET | Freq: Every day | ORAL | 5 refills | Status: DC

## 2018-06-19 NOTE — Assessment & Plan Note (Signed)
Wayne Wolfe appears to be going to the normal stages of grief with the most recent passing of his mother in July.  He has no suicidal ideations and appears to be functioning well.  Declines counseling services in our office but is made aware of that they are available as needed.  No medication is recommended at this time.  Continue to monitor.

## 2018-06-19 NOTE — Progress Notes (Signed)
Subjective:    Patient ID: Wayne Wolfe, male    DOB: Feb 11, 1966, 52 y.o.   MRN: [id number]  Chief Complaint  Patient presents with  . HIV Positive/AIDS    HPI:  Wayne Wolfe is a 52 y.o. male who presents today for routine follow-up of HIV disease.  Wayne Wolfe was last seen in the office on 10/21/2017 and well controlled with a regimen of Odefsey.  His most recent blood work reviewed from 04/16/2018 shows a viral load that is undetectable and a CD4 count of 463.  His hemoglobin A1c was noted to be 10.4.  Kidney function, liver function, and electrolytes within normal ranges.  Lipid profile shows a decreased HDL level of 26 with normal LDL of 93 and triglycerides of 136.  Wayne Wolfe continues to take his Vernell Leep as prescribed with no adverse side effects.  He takes it with a meal and is not currently on antacids.  He has not missed any doses and has no problems obtaining his medications.  He recently lost his mother in July and notes he is going through the grieving process but overall doing well with no suicidal ideation. Denies fevers, chills, night sweats, headaches, changes in vision, neck pain/stiffness, nausea, diarrhea, vomiting, lesions or rashes.   No Known Allergies    Outpatient Medications Prior to Visit  Medication Sig Dispense Refill  . ACCU-CHEK FASTCLIX LANCETS MISC Check blood sugar before meals and after meals 102 each 12  . Blood Glucose Monitoring Suppl (ACCU-CHEK GUIDE) w/Device KIT 1 each by Does not apply route 6 (six) times daily. 1 kit 0  . glucose blood (ACCU-CHEK GUIDE) test strip Check blood sugar before meals and after meals 100 each 12  . hydrochlorothiazide (HYDRODIURIL) 25 MG tablet TAKE 1 TABLET(25 MG) BY MOUTH DAILY 90 tablet 0  . Insulin Pen Needle (PEN NEEDLES) 31G X 5 MM MISC 1 each by Does not apply route daily. 100 each 4  . liraglutide (VICTOZA) 18 MG/3ML SOPN ADMINISTER 1.2 MG UNDER THE SKIN DAILY 6 mL 0  . metFORMIN (GLUCOPHAGE-XR) 500 MG  24 hr tablet Take 2 tablets (1,000 mg total) by mouth daily with breakfast. 120 tablet 11  . pravastatin (PRAVACHOL) 20 MG tablet TAKE 1 TABLET BY MOUTH EVERY DAY 30 tablet 5  . pravastatin (PRAVACHOL) 40 MG tablet Take 1 tablet (40 mg total) by mouth daily. 90 tablet 3  . ODEFSEY 200-25-25 MG TABS tablet TAKE 1 TABLET BY MOUTH DAILY WITH BREAKFAST 30 tablet 5  . glipiZIDE (GLUCOTROL) 5 MG tablet Take 1 tablet (5 mg total) by mouth daily. 90 tablet 1   No facility-administered medications prior to visit.      Past Medical History:  Diagnosis Date  . Dizziness 03/13/2017  . HIV (human immunodeficiency virus infection) ([place]) 2009   11/20/2012 Last CD4 count 210 and VL <20  . 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 Hypertension 2009   Well controlled on HCTZ  . Otitis media 03/13/2017  . Polyuria 03/13/2017  . Poorly controlled diabetes mellitus ([place]) 03/13/2017  . Type 2 diabetes mellitus (HCC)    Well controlled on metformin  . Weight loss 03/13/2017     Past Surgical History:  Procedure Laterality Date  . CARPAL TUNNEL RELEASE Right 1990s  . none      Review of Systems  Constitutional: Negative for appetite change, chills, fatigue, fever and unexpected weight change.  Eyes: Negative for visual disturbance.  Respiratory: Negative for cough,  chest tightness, shortness of breath and wheezing.   Cardiovascular: Negative for chest pain and leg swelling.  Gastrointestinal: Negative for abdominal pain, constipation, diarrhea, nausea and vomiting.  Genitourinary: Negative for dysuria, flank pain, frequency, genital sores, hematuria and urgency.  Skin: Negative for rash.  Allergic/Immunologic: Negative for immunocompromised state.  Neurological: Negative for dizziness and headaches.      Objective:    BP 123/88   Pulse 83   Temp 98.6 F (37 C)   Wt 244 lb 6.4 oz (110.9 kg)   BMI 39.45 kg/m  Nursing note and vital signs reviewed.  Physical Exam    Constitutional: He is oriented to person, place, and time. He appears well-developed. No distress.  HENT:  Mouth/Throat: Oropharynx is clear and moist.  Eyes: Conjunctivae are normal.  Neck: Neck supple.  Cardiovascular: Normal rate, regular rhythm, normal heart sounds and intact distal pulses. Exam reveals no gallop and no friction rub.  No murmur heard. Pulmonary/Chest: Effort normal and breath sounds normal. No respiratory distress. He has no wheezes. He has no rales. He exhibits no tenderness.  Abdominal: Soft. Bowel sounds are normal. There is no tenderness.  Lymphadenopathy:    He has no cervical adenopathy.  Neurological: He is alert and oriented to person, place, and time.  Skin: Skin is warm and dry. No rash noted.  Psychiatric: He has a normal mood and affect. His behavior is normal. Judgment and thought content normal.       Assessment & Plan:   Problem List Items Addressed This Visit      Endocrine   Hyperlipidemia associated with type 2 diabetes mellitus ([place]) (Chronic)    Wayne Wolfe is a decrease risk for cardiovascular disease with an HDL of 26.  Fortunately his LDL and triglycerides are adequately controlled.  His diabetes is poorly controlled with current medication regimen his most recent A1c of 10.4.  Emphasized importance of reducing carbohydrates and processed/sugary foods in his intake.  Also recommend decreasing seed oils as these are sources of omega 6 fatty acids.  He remains at increased risk for cardiovascular disease with HIV status.  We will continue to monitor.        Other   Human immunodeficiency virus (HIV) disease ([place]) - Primary (Chronic)    Wayne Wolfe has well-controlled HIV disease with his current regimen of Odefsey with no adverse side effects.  He is adherent to the medication regimen and has no problems obtaining his medications.  Emphasized importance of taking medication with a meal and avoiding antacids.  He has no signs/symptoms of  opportunistic infection or progressive HIV disease through history or physical exam.  Continue current dose of Odefsey.  Prevnar and influenza updated today.  Plan for office follow-up in 6 months or sooner if needed with blood work 1 to 2 weeks prior to appointment.      Relevant Medications   emtricitabine-rilpivir-tenofovir AF (ODEFSEY) 200-25-25 MG TABS tablet   Other Relevant Orders   Pneumococcal conjugate vaccine 13-valent IM (Completed)   Grief    Mr. Pudlo appears to be going to the normal stages of grief with the most recent passing of his mother in July.  He has no suicidal ideations and appears to be functioning well.  Declines counseling services in our office but is made aware of that they are available as needed.  No medication is recommended at this time.  Continue to monitor.       Other Visit Diagnoses    Seasonal allergic  rhinitis due to pollen       Relevant Medications   fexofenadine (ALLEGRA) 180 MG tablet   Need for pneumococcal vaccination       Relevant Orders   Pneumococcal conjugate vaccine 13-valent IM (Completed)       I have changed Shanon Brow A. Strider's ODEFSEY to emtricitabine-rilpivir-tenofovir AF. I am also having him start on fexofenadine. Additionally, I am having him maintain his pravastatin, Pen Needles, ACCU-CHEK FASTCLIX LANCETS, ACCU-CHEK GUIDE, glucose blood, metFORMIN, pravastatin, hydrochlorothiazide, glipiZIDE, and liraglutide.   Meds ordered this encounter  Medications  . fexofenadine (ALLEGRA) 180 MG tablet    Sig: Take 1 tablet (180 mg total) by mouth daily.    Dispense:  30 tablet    Refill:  2    Order Specific Question:   Supervising Provider    Answer:   Carlyle Basques [[id number]]  . emtricitabine-rilpivir-tenofovir AF (ODEFSEY) 200-25-25 MG TABS tablet    Sig: Take 1 tablet by mouth daily with breakfast.    Dispense:  30 tablet    Refill:  5    Order Specific Question:   Supervising Provider    Answer:   Carlyle Basques [[id number]]      Follow-up: Return in about 6 months (around 12/18/2018), or if symptoms worsen or fail to improve.   Terri Piedra, MSN, FNP-C Nurse Practitioner [facility] Office phone: [phone] Pager: [place] number: [phone]

## 2018-06-19 NOTE — Assessment & Plan Note (Signed)
" >>  ASSESSMENT AND PLAN FOR HUMAN IMMUNODEFICIENCY VIRUS (HIV) DISEASE (HCC) WRITTEN ON 06/19/2018 12:12 PM BY Tovah Slavick D, FNP  Mr. Vecchione has well-controlled HIV disease with his current regimen of Odefsey  with no adverse side effects.  He is adherent to the medication regimen and has no problems obtaining his medications.  Emphasized importance of taking medication with a meal and avoiding antacids.  He has no signs/symptoms of opportunistic infection or progressive HIV disease through history or physical exam.  Continue current dose of Odefsey .  Prevnar and influenza updated today.  Plan for office follow-up in 6 months or sooner if needed with blood work 1 to 2 weeks prior to appointment. "

## 2018-06-19 NOTE — Patient Instructions (Signed)
Nice to see you.  Please continue take your [facility] as prescribed.  Ensure you are eating it with a meal and avoiding antacids as you have been doing.  Plan for office follow-up in 6 months or sooner if needed with blood work 1 to 2 weeks prior to your appointment.  For your ears you can obtain Debrox or Murine ear cleaning kits.  If this does not improve the right ear then I recommend following up with your primary care provider.  Sorry to hear about your mom.  Rollene Fare, our counselor, is here if you need her.  Please let us know if you have any questions or concerns.

## 2018-06-19 NOTE — Assessment & Plan Note (Signed)
Wayne Wolfe is a decrease risk for cardiovascular disease with an HDL of 26.  Fortunately his LDL and triglycerides are adequately controlled.  His diabetes is poorly controlled with current medication regimen his most recent A1c of 10.4.  Emphasized importance of reducing carbohydrates and processed/sugary foods in his intake.  Also recommend decreasing seed oils as these are sources of omega 6 fatty acids.  He remains at increased risk for cardiovascular disease with HIV status.  We will continue to monitor.

## 2018-06-19 NOTE — Assessment & Plan Note (Signed)
Wayne Wolfe has well-controlled HIV disease with his current regimen of Odefsey with no adverse side effects.  He is adherent to the medication regimen and has no problems obtaining his medications.  Emphasized importance of taking medication with a meal and avoiding antacids.  He has no signs/symptoms of opportunistic infection or progressive HIV disease through history or physical exam.  Continue current dose of Odefsey.  Prevnar and influenza updated today.  Plan for office follow-up in 6 months or sooner if needed with blood work 1 to 2 weeks prior to appointment.

## 2018-07-01 ENCOUNTER — Other Ambulatory Visit: Payer: Self-pay | Admitting: Internal Medicine

## 2018-07-01 NOTE — Telephone Encounter (Signed)
Last seen in [facility] 12/26/2017  No showed 05/30/2018  Future appt scheduled for 07/04/2018 w/pcp

## 2018-07-03 NOTE — Progress Notes (Signed)
   CC: Diabetes Mellitus Follow up  HPI:  Wayne Wolfe is a 52 y.o. with hypertension, diabetes mellitus type 2, hyperlipidemia, hiv, and incidental pulmonary nodule who presents for diabetes mellitus follow up. Please see problem based charting for evaluation, assessment, and plan.  Past Medical History:  Diagnosis Date  . Dizziness 03/13/2017  . HIV (human immunodeficiency virus infection) ([place]) 2009   11/20/2012 Last CD4 count 210 and VL <20  . 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 Hypertension 2009   Well controlled on HCTZ  . Otitis media 03/13/2017  . Polyuria 03/13/2017  . Poorly controlled diabetes mellitus ([place]) 03/13/2017  . Type 2 diabetes mellitus (HCC)    Well controlled on metformin  . Weight loss 03/13/2017   Review of Systems:    Review of Systems  Constitutional: Negative for chills, fever and weight loss.  Cardiovascular: Negative for chest pain.  Gastrointestinal: Negative for nausea and vomiting.  Neurological: Positive for headaches. Negative for dizziness.   Physical Exam:  Vitals:   07/04/18 1426  BP: 121/76  Pulse: 88  Temp: 98.3 F (36.8 C)  TempSrc: Oral  SpO2: 99%  Weight: 242 lb 3.2 oz (109.9 kg)  Height: 5\' 7"  (1.702 m)   Physical Exam  Constitutional: He appears well-developed and well-nourished. No distress.  HENT:  Head: Normocephalic and atraumatic.  Eyes: Conjunctivae are normal.  Cardiovascular: Normal rate, regular rhythm and normal heart sounds.  Respiratory: Effort normal and breath sounds normal. No respiratory distress. He has no wheezes.  GI: Soft. Bowel sounds are normal. He exhibits no distension. There is no tenderness.  Musculoskeletal: He exhibits no edema.  Neurological: He is alert.  Skin: He is not diaphoretic. No erythema.  Psychiatric: He has a normal mood and affect. His behavior is normal. Judgment and thought content normal.    Assessment & Plan:   See Encounters Tab for problem based  charting.  Patient discussed with Dr. Evette Doffing

## 2018-07-04 ENCOUNTER — Encounter: Payer: Self-pay | Admitting: Internal Medicine

## 2018-07-04 ENCOUNTER — Other Ambulatory Visit: Payer: Self-pay

## 2018-07-04 ENCOUNTER — Ambulatory Visit: Payer: Self-pay | Admitting: Internal Medicine

## 2018-07-04 VITALS — BP 121/76 | HR 88 | Temp 98.3°F | Ht 67.0 in | Wt 242.2 lb

## 2018-07-04 DIAGNOSIS — R911 Solitary pulmonary nodule: Secondary | ICD-10-CM

## 2018-07-04 DIAGNOSIS — I10 Essential (primary) hypertension: Secondary | ICD-10-CM

## 2018-07-04 DIAGNOSIS — E785 Hyperlipidemia, unspecified: Secondary | ICD-10-CM

## 2018-07-04 DIAGNOSIS — Z7984 Long term (current) use of oral hypoglycemic drugs: Secondary | ICD-10-CM

## 2018-07-04 DIAGNOSIS — I152 Hypertension secondary to endocrine disorders: Secondary | ICD-10-CM

## 2018-07-04 DIAGNOSIS — E118 Type 2 diabetes mellitus with unspecified complications: Secondary | ICD-10-CM

## 2018-07-04 DIAGNOSIS — R51 Headache: Secondary | ICD-10-CM

## 2018-07-04 DIAGNOSIS — E1165 Type 2 diabetes mellitus with hyperglycemia: Secondary | ICD-10-CM

## 2018-07-04 DIAGNOSIS — Z21 Asymptomatic human immunodeficiency virus [HIV] infection status: Secondary | ICD-10-CM

## 2018-07-04 DIAGNOSIS — R918 Other nonspecific abnormal finding of lung field: Secondary | ICD-10-CM

## 2018-07-04 DIAGNOSIS — E1159 Type 2 diabetes mellitus with other circulatory complications: Secondary | ICD-10-CM

## 2018-07-04 DIAGNOSIS — Z79899 Other long term (current) drug therapy: Secondary | ICD-10-CM

## 2018-07-04 LAB — POCT GLYCOSYLATED HEMOGLOBIN (HGB A1C): Hemoglobin A1C: 13.7 % — AB (ref 4.0–5.6)

## 2018-07-04 LAB — GLUCOSE, CAPILLARY: Glucose-Capillary: 332 mg/dL — ABNORMAL HIGH (ref 70–99)

## 2018-07-04 MED ORDER — METFORMIN HCL ER 500 MG PO TB24: 2000.0000 mg | ORAL_TABLET | Freq: Every day | ORAL | 0 refills | Status: DC

## 2018-07-04 MED ORDER — INSULIN GLARGINE 100 UNIT/ML SOLOSTAR PEN
15.0000 [IU] | PEN_INJECTOR | Freq: Every day | SUBCUTANEOUS | 2 refills | Status: DC
Start: 2018-07-04 — End: 2018-09-19

## 2018-07-04 MED ORDER — LISINOPRIL 10 MG PO TABS: 10.0000 mg | ORAL_TABLET | Freq: Every day | ORAL | 0 refills | Status: DC

## 2018-07-04 NOTE — Assessment & Plan Note (Signed)
The patient is taking metformin 1000mg  qd, victoza 1.2mg  qd, glipizide 5mg  qd. The patient is taking victoza and glipizide regularly, but he misses occasional doses of metformin. The patient's last a1c=10.4 in July 2019 and his a1c during this visit is 13.7 The patient's home blood glucose measurements over the past month have ranged 115-260.  The patient has lost 9lbs since March 2019.   Assessment and plan  The patient's diabetes is not at goal. Maximized metformin to 1000mg  bid and changed formulation to XR so that the patient is less likely to get gi discomfort. Started the patient on lantus 15u daily based on 0.1-0.2u/kg dosing. Stopped glipizide.

## 2018-07-04 NOTE — Patient Instructions (Addendum)
It was a pleasure to see you today Mr. Winer. Unfortunately, your blood glucose levels are not under control. Therefore, I would like to start you on insulin. Please start taking lantus 15units along with metformin xr 1000mg  twice daily, and victoza. Please stop taking glipizide. Please record your blood glucose readings and bring them to your next visit in 3 weeks.   Please stop taking hydrochlorothiazide and start taking lisinopril 10mg  daily.  If you have any questions or concerns, please call our clinic at [phone] between 9am-5pm and after hours call [phone] and ask for the internal medicine resident on call. If you feel you are having a medical emergency please call 911.   Thank you, we look forward to help you remain healthy!  Lars Mage, MD Internal Medicine PGY2

## 2018-07-04 NOTE — Assessment & Plan Note (Addendum)
The patient's blood pressure during this visit was 12/76. The patient is currently taking hctz 25mg  qd. His last blood pressure visits are  BP Readings from Last 3 Encounters:  07/04/18 121/76  06/19/18 123/88  04/16/18 115/80   Assessment and plan  The patient's blood pressure is at goal. However, will change the hctz to lisinopril 10mg  qd as he has concomitant diabetes and he has elevated protein/creatinine=135.

## 2018-07-04 NOTE — Assessment & Plan Note (Signed)
There was an incidentally found 36mm pulmonary nodule in the right lower lung lobe.   Assessment and plan  Ordered repeat non-contrast chest ct for the patient to get done after he has financial services appointment.

## 2018-07-07 NOTE — Progress Notes (Signed)
Internal Medicine Clinic Attending  Case discussed with Dr. Chundi at the time of the visit.  We reviewed the resident's history and exam and pertinent patient test results.  I agree with the assessment, diagnosis, and plan of care documented in the resident's note. 

## 2018-07-12 ENCOUNTER — Other Ambulatory Visit: Payer: Self-pay | Admitting: Infectious Disease

## 2018-07-12 ENCOUNTER — Other Ambulatory Visit: Payer: Self-pay | Admitting: Internal Medicine

## 2018-07-12 DIAGNOSIS — E1165 Type 2 diabetes mellitus with hyperglycemia: Secondary | ICD-10-CM

## 2018-07-12 DIAGNOSIS — E785 Hyperlipidemia, unspecified: Secondary | ICD-10-CM

## 2018-07-14 ENCOUNTER — Telehealth: Payer: Self-pay | Admitting: *Deleted

## 2018-07-14 ENCOUNTER — Other Ambulatory Visit: Payer: Self-pay | Admitting: Internal Medicine

## 2018-07-14 DIAGNOSIS — E1165 Type 2 diabetes mellitus with hyperglycemia: Secondary | ICD-10-CM

## 2018-07-14 NOTE — Telephone Encounter (Signed)
Patient asking for refill of pravachol 20 mg. There is also pravachol 40 mg on his profile. Please advise. Landis Gandy, RN

## 2018-07-14 NOTE — Telephone Encounter (Signed)
If he is taking 20 now he is at goal of <100. If PCP wants him less than 70 he will need something more potent

## 2018-07-15 ENCOUNTER — Other Ambulatory Visit: Payer: Self-pay | Admitting: Infectious Disease

## 2018-07-15 ENCOUNTER — Other Ambulatory Visit: Payer: Self-pay | Admitting: Internal Medicine

## 2018-07-15 DIAGNOSIS — E785 Hyperlipidemia, unspecified: Secondary | ICD-10-CM

## 2018-07-15 NOTE — Telephone Encounter (Signed)
Thanks so much Vahini!

## 2018-07-15 NOTE — Telephone Encounter (Signed)
Pravastatin 20 mg last filled 06/03/18.  RN refilled 30 days supply.  Patient is scheduled to see Primary Care 07/25/18.  RN left pravastatin 40 mg on medication list for reconciliation with primary care appointment  Landis Gandy, RN

## 2018-07-15 NOTE — Telephone Encounter (Signed)
Patient has ascvd risk score of 9.9% per most recent lipid profile data and his other statistics. He needs to be on a moderate to high intensity statin, therefore please fill the pravastatin 40mg . I will remove pravastatin 20mg  from his list. Thank you!  Lars Mage, MD Internal Medicine PGY2 MVHQI:[phone] 07/15/2018, 3:32 PM

## 2018-07-18 ENCOUNTER — Other Ambulatory Visit: Payer: Self-pay | Admitting: Infectious Disease

## 2018-07-18 ENCOUNTER — Other Ambulatory Visit: Payer: Self-pay | Admitting: *Deleted

## 2018-07-18 DIAGNOSIS — E1169 Type 2 diabetes mellitus with other specified complication: Secondary | ICD-10-CM

## 2018-07-18 DIAGNOSIS — E785 Hyperlipidemia, unspecified: Principal | ICD-10-CM

## 2018-07-18 MED ORDER — PRAVASTATIN SODIUM 40 MG PO TABS: 40.0000 mg | ORAL_TABLET | Freq: Every day | ORAL | 0 refills | Status: DC

## 2018-07-23 NOTE — Progress Notes (Signed)
   CC: Diabetes Mellitus Follow up  HPI:  [name] is a 52 y.o. person with HIV, diabetes mellitus type 2, and hypertension who presents for diabetes mellitus follow up. Please see problem based charting for evaluation, assessment, and plan.  Past Medical History:  Diagnosis Date  . Dizziness 03/13/2017  . HIV (human immunodeficiency virus infection) ([place]) 2009   11/20/2012 Last CD4 count 210 and VL <20  . 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 Hypertension 2009   Well controlled on HCTZ  . Otitis media 03/13/2017  . Polyuria 03/13/2017  . Poorly controlled diabetes mellitus ([place]) 03/13/2017  . Type 2 diabetes mellitus (HCC)    Well controlled on metformin  . Weight loss 03/13/2017   Review of Systems:    Review of Systems  Constitutional: Positive for weight loss. Negative for malaise/fatigue.  Cardiovascular: Negative for chest pain.  Gastrointestinal: Negative for abdominal pain, nausea and vomiting.   Physical Exam:  Vitals:   07/25/18 1534  BP: 122/89  Pulse: 96  Temp: 98.9 F (37.2 C)  TempSrc: Oral  SpO2: 96%  Weight: 240 lb 4.8 oz (109 kg)  Height: 5\' 7"  (1.702 m)   Physical Exam  Constitutional: He appears well-developed and well-nourished. No distress.  HENT:  Head: Normocephalic and atraumatic.  Cardiovascular: Normal rate, regular rhythm and normal heart sounds.  Respiratory: Effort normal and breath sounds normal. No respiratory distress. He has no wheezes.  GI: Soft. Bowel sounds are normal. He exhibits no distension. There is no tenderness.  Musculoskeletal: He exhibits no edema.  Neurological: He is alert.  Skin: He is not diaphoretic.  Psychiatric: He has a normal mood and affect. His behavior is normal. Judgment and thought content normal.    Assessment & Plan:   See Encounters Tab for problem based charting.  Patient discussed with Dr. Evette Doffing

## 2018-07-25 ENCOUNTER — Other Ambulatory Visit: Payer: Self-pay

## 2018-07-25 ENCOUNTER — Encounter: Payer: Self-pay | Admitting: Internal Medicine

## 2018-07-25 ENCOUNTER — Ambulatory Visit (INDEPENDENT_AMBULATORY_CARE_PROVIDER_SITE_OTHER): Payer: Self-pay | Admitting: Internal Medicine

## 2018-07-25 VITALS — BP 122/89 | HR 96 | Temp 98.9°F | Ht 67.0 in | Wt 240.3 lb

## 2018-07-25 DIAGNOSIS — E118 Type 2 diabetes mellitus with unspecified complications: Secondary | ICD-10-CM

## 2018-07-25 DIAGNOSIS — R911 Solitary pulmonary nodule: Secondary | ICD-10-CM

## 2018-07-25 DIAGNOSIS — I152 Hypertension secondary to endocrine disorders: Secondary | ICD-10-CM

## 2018-07-25 DIAGNOSIS — Z6837 Body mass index (BMI) 37.0-37.9, adult: Secondary | ICD-10-CM

## 2018-07-25 DIAGNOSIS — Z21 Asymptomatic human immunodeficiency virus [HIV] infection status: Secondary | ICD-10-CM

## 2018-07-25 DIAGNOSIS — Z23 Encounter for immunization: Secondary | ICD-10-CM

## 2018-07-25 DIAGNOSIS — E1165 Type 2 diabetes mellitus with hyperglycemia: Secondary | ICD-10-CM

## 2018-07-25 DIAGNOSIS — B20 Human immunodeficiency virus [HIV] disease: Secondary | ICD-10-CM

## 2018-07-25 DIAGNOSIS — R634 Abnormal weight loss: Secondary | ICD-10-CM

## 2018-07-25 DIAGNOSIS — Z794 Long term (current) use of insulin: Secondary | ICD-10-CM

## 2018-07-25 DIAGNOSIS — E785 Hyperlipidemia, unspecified: Secondary | ICD-10-CM

## 2018-07-25 DIAGNOSIS — E1169 Type 2 diabetes mellitus with other specified complication: Secondary | ICD-10-CM

## 2018-07-25 DIAGNOSIS — E1159 Type 2 diabetes mellitus with other circulatory complications: Secondary | ICD-10-CM

## 2018-07-25 DIAGNOSIS — Z79899 Other long term (current) drug therapy: Secondary | ICD-10-CM

## 2018-07-25 DIAGNOSIS — I10 Essential (primary) hypertension: Secondary | ICD-10-CM

## 2018-07-25 LAB — GLUCOSE, CAPILLARY: Glucose-Capillary: 138 mg/dL — ABNORMAL HIGH (ref 70–99)

## 2018-07-25 NOTE — Assessment & Plan Note (Signed)
The patient's blood pressure during this visit was 122/89. The patient is currently taking lisinopril 10mg  qd. His last blood pressure visits are   BP Readings from Last 3 Encounters:  07/25/18 122/89  07/04/18 121/76  06/19/18 123/88   The patient does not report palpitations, dizziness, chest pain, sob.  Plan Continue lisinopril 10mg  qd

## 2018-07-25 NOTE — Assessment & Plan Note (Signed)
The patient had a incidentally found 7 mm pulmonary nodule in his right lower lobe. Will order a ct chest to follow up after he has had his financial services appt in Nov 2019.

## 2018-07-25 NOTE — Addendum Note (Signed)
Addended by: Ebbie Latus on: 07/25/2018 04:19 PM   Modules accepted: Orders

## 2018-07-25 NOTE — Assessment & Plan Note (Signed)
The patient's last lipid panel was done in July 2019 showed total cholesterol 122, HDL 26, triglycerides 136, LDL 93. The patient's ascvd risk score is 9.9%. The patient should continue taking moderate/high intensity statin.   -Continue pravastatin 40mg  qd

## 2018-07-25 NOTE — Assessment & Plan Note (Signed)
The patient presented for a follow up of his diabetes. He is currently taking metformin 2000mg  qd, victoza 1.2mg  qd, glargine 15u qhs. The patient's last a1c=13.7 on 07/01/18. The patient's home blood glucose measurements over the past month have ranged 130-160. The patient has been monitoring his diet and walking daily. He has lost 2lbs over the past 3weeks.   Assessment and plan  Based on the patient's blood glucose readings it appears that his diabetes control is improving. Will continue current regimen of metformin 2000mg  qd, victoza 1.2mg  qd, glargine 15u qhs. Follow up in 2 months at which time will get repeat a1c.

## 2018-07-25 NOTE — Patient Instructions (Signed)
It was a pleasure to see you today Mr. Chopp. Your glucose reading during this visit was 138. Please continue your current diabetes regimen of metformin 1000mg  twice daily, victoza 1.2mg  daily, lantus 15u nightly.   -Please continue to avoid carbohydrates and foods that are high in sugar content  -Please exercise 54min daily  If you have any questions or concerns, please call our clinic at [phone] between 9am-5pm and after hours call [phone] and ask for the internal medicine resident on call. If you feel you are having a medical emergency please call 911.   Thank you, we look forward to help you remain healthy!  Lars Mage, MD Internal Medicine PGY2

## 2018-07-28 NOTE — Progress Notes (Signed)
Internal Medicine Clinic Attending  Case discussed with Dr. Chundi at the time of the visit.  We reviewed the resident's history and exam and pertinent patient test results.  I agree with the assessment, diagnosis, and plan of care documented in the resident's note. 

## 2018-08-05 ENCOUNTER — Encounter: Payer: Self-pay | Admitting: Internal Medicine

## 2018-08-05 ENCOUNTER — Ambulatory Visit: Payer: Self-pay

## 2018-08-08 ENCOUNTER — Other Ambulatory Visit: Payer: Self-pay | Admitting: Infectious Disease

## 2018-08-08 ENCOUNTER — Other Ambulatory Visit: Payer: Self-pay | Admitting: Internal Medicine

## 2018-08-08 DIAGNOSIS — E785 Hyperlipidemia, unspecified: Secondary | ICD-10-CM

## 2018-08-08 DIAGNOSIS — E1165 Type 2 diabetes mellitus with hyperglycemia: Secondary | ICD-10-CM

## 2018-08-11 ENCOUNTER — Ambulatory Visit (HOSPITAL_COMMUNITY)
Admission: RE | Admit: 2018-08-11 | Discharge: 2018-08-11 | Disposition: A | Discharge status: Home / Self Care | Payer: Self-pay | Source: Ambulatory Visit | Attending: Internal Medicine | Admitting: Internal Medicine

## 2018-08-11 DIAGNOSIS — K8020 Calculus of gallbladder without cholecystitis without obstruction: Secondary | ICD-10-CM | POA: Insufficient documentation

## 2018-08-11 DIAGNOSIS — I2510 Atherosclerotic heart disease of native coronary artery without angina pectoris: Secondary | ICD-10-CM | POA: Insufficient documentation

## 2018-08-11 DIAGNOSIS — R911 Solitary pulmonary nodule: Secondary | ICD-10-CM | POA: Insufficient documentation

## 2018-08-11 DIAGNOSIS — J439 Emphysema, unspecified: Secondary | ICD-10-CM | POA: Insufficient documentation

## 2018-09-19 ENCOUNTER — Other Ambulatory Visit: Payer: Self-pay

## 2018-09-19 ENCOUNTER — Ambulatory Visit: Payer: Self-pay | Admitting: Internal Medicine

## 2018-09-19 ENCOUNTER — Encounter: Payer: Self-pay | Admitting: Internal Medicine

## 2018-09-19 ENCOUNTER — Encounter (INDEPENDENT_AMBULATORY_CARE_PROVIDER_SITE_OTHER): Payer: Self-pay

## 2018-09-19 VITALS — BP 118/84 | HR 88 | Temp 98.1°F | Ht 67.0 in | Wt 244.5 lb

## 2018-09-19 DIAGNOSIS — Z794 Long term (current) use of insulin: Secondary | ICD-10-CM

## 2018-09-19 DIAGNOSIS — R51 Headache: Secondary | ICD-10-CM

## 2018-09-19 DIAGNOSIS — I152 Hypertension secondary to endocrine disorders: Secondary | ICD-10-CM

## 2018-09-19 DIAGNOSIS — I10 Essential (primary) hypertension: Secondary | ICD-10-CM

## 2018-09-19 DIAGNOSIS — Z79899 Other long term (current) drug therapy: Secondary | ICD-10-CM

## 2018-09-19 DIAGNOSIS — E1165 Type 2 diabetes mellitus with hyperglycemia: Secondary | ICD-10-CM

## 2018-09-19 DIAGNOSIS — R911 Solitary pulmonary nodule: Secondary | ICD-10-CM

## 2018-09-19 DIAGNOSIS — R197 Diarrhea, unspecified: Secondary | ICD-10-CM

## 2018-09-19 DIAGNOSIS — B20 Human immunodeficiency virus [HIV] disease: Secondary | ICD-10-CM

## 2018-09-19 DIAGNOSIS — E1159 Type 2 diabetes mellitus with other circulatory complications: Secondary | ICD-10-CM

## 2018-09-19 DIAGNOSIS — E118 Type 2 diabetes mellitus with unspecified complications: Secondary | ICD-10-CM

## 2018-09-19 LAB — POCT GLYCOSYLATED HEMOGLOBIN (HGB A1C): HEMOGLOBIN A1C: 10.5 % — AB (ref 4.0–5.6)

## 2018-09-19 LAB — GLUCOSE, CAPILLARY: Glucose-Capillary: 128 mg/dL — ABNORMAL HIGH (ref 70–99)

## 2018-09-19 MED ORDER — INSULIN GLARGINE 100 UNIT/ML SOLOSTAR PEN: 20.0000 [IU] | PEN_INJECTOR | Freq: Every day | SUBCUTANEOUS | 1 refills | Status: DC

## 2018-09-19 MED ORDER — METFORMIN HCL ER (OSM) 1000 MG PO TB24: 2000.0000 mg | ORAL_TABLET | Freq: Every day | ORAL | 1 refills | Status: DC

## 2018-09-19 MED ORDER — LIRAGLUTIDE 18 MG/3ML ~~LOC~~ SOPN: PEN_INJECTOR | SUBCUTANEOUS | 1 refills | Status: DC

## 2018-09-19 NOTE — Assessment & Plan Note (Signed)
The patient's last infectious disease appointment was in March 2018.  The patient's last CD4 count was 2.5, viral load <40 copies. The patient is currently on odefsey.

## 2018-09-19 NOTE — Assessment & Plan Note (Signed)
The patient was followed up for incidentally found pulmonary nodule. CT chest done in November 2019 showed nodule to be consistent in size at 41mm. Will repeat scan in 18-24 months.

## 2018-09-19 NOTE — Assessment & Plan Note (Signed)
The patient's blood pressure during this visit was 118/84. The patient is currently taking lisinopril 10mg  qd. His last blood pressure visits are   BP Readings from Last 3 Encounters:  09/19/18 118/84  07/25/18 122/89  07/04/18 121/76   Assessment and Plan The patient's blood pressure is at goal and will continue on lisinopril 10mg  qd.

## 2018-09-19 NOTE — Assessment & Plan Note (Signed)
The patient's last a1c=13.7 in October 2019 and during this visit it is 10.5. The patient states that he has been taking all of his medication regularly - metformin 2000 mg daily, Victoza 1.2 mg daily, Lantus 15 units daily. He has gained 4lbs in the past 2 months.    Assessment and plan  The patient's blood glucose levels have improved, but he is still not at goal. There is no additional benefit from increaing the patient's victoza dose from 1.2mg  to 1.8mg . Will increase lantus from 15u to 20u. Follow up in 3 months.

## 2018-09-19 NOTE — Progress Notes (Signed)
   CC: Diabetes Follow up  HPI:  Wayne Wolfe is a 52 y.o. male with HIV, hypertension, diabetes mellitus who presents for diabetes follow-up. Please see problem based charting for evaluation, assessment, and plan.  Past Medical History:  Diagnosis Date  . Dizziness 03/13/2017  . HIV (human immunodeficiency virus infection) ([place]) 2009   11/20/2012 Last CD4 count 210 and VL <20  . 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 Hypertension 2009   Well controlled on HCTZ  . Otitis media 03/13/2017  . Polyuria 03/13/2017  . Poorly controlled diabetes mellitus ([place]) 03/13/2017  . Type 2 diabetes mellitus (HCC)    Well controlled on metformin  . Weight loss 03/13/2017   Review of Systems:    Review of Systems  Constitutional: Negative for weight loss.  Cardiovascular: Negative for chest pain.  Gastrointestinal: Positive for diarrhea (loose stools). Negative for abdominal pain, nausea and vomiting.  Neurological: Positive for headaches. Negative for dizziness.   Physical Exam:  Vitals:   09/19/18 1419  BP: 118/84  Pulse: 88  Temp: 98.1 F (36.7 C)  TempSrc: Oral  SpO2: 97%  Weight: 244 lb 8 oz (110.9 kg)  Height: 5\' 7"  (1.702 m)   Physical Exam  Constitutional: Appears well-developed and well-nourished. No distress.  HENT:  Head: Normocephalic and atraumatic.  Eyes: Conjunctivae are normal.  Cardiovascular: Normal rate, regular rhythm and normal heart sounds.  Respiratory: Effort normal and breath sounds normal. No respiratory distress. No wheezes.  GI: Soft. Bowel sounds are normal. No distension. There is no tenderness.  Musculoskeletal: No edema.  Neurological: Is alert.  Skin: Not diaphoretic. No erythema.  Psychiatric: Normal mood and affect. Behavior is normal. Judgment and thought content normal.   Assessment & Plan:   See Encounters Tab for problem based charting.  Patient discussed with Dr. Evette Doffing

## 2018-09-19 NOTE — Patient Instructions (Signed)
It was a pleasure to see you today [name]. Your blood pressure during this visit is well controlled. Your blood glucose is improved but still needs to be better controlled. Please increase your lantus dose from 15u daily to 20u daily. Follow up in 3 months.  If you have any questions or concerns, please call our clinic at [phone] between 9am-5pm and after hours call [phone] and ask for the internal medicine resident on call. If you feel you are having a medical emergency please call 911.   Thank you, we look forward to help you remain healthy!  Wayne Mage, MD Internal Medicine PGY2

## 2018-09-19 NOTE — Assessment & Plan Note (Signed)
" >>  ASSESSMENT AND PLAN FOR HUMAN IMMUNODEFICIENCY VIRUS (HIV) DISEASE (HCC) WRITTEN ON 09/19/2018  5:13 PM BY KELBY SCHATZ, MD  The patient's last infectious disease appointment was in March 2018.  The patient's last CD4 count was 2.5, viral load <40 copies. The patient is currently on odefsey .   "

## 2018-09-22 ENCOUNTER — Telehealth: Payer: Self-pay | Admitting: *Deleted

## 2018-09-22 MED ORDER — METFORMIN HCL ER 500 MG PO TB24: 1000.0000 mg | ORAL_TABLET | Freq: Two times a day (BID) | ORAL | 1 refills | Status: DC

## 2018-09-22 MED FILL — metFORMIN HCL ER 500 MG TB2: 500 | 30 days supply | Qty: 120 | Fill #0

## 2018-09-22 NOTE — Progress Notes (Signed)
Internal Medicine Clinic Attending  Case discussed with Dr. Chundi at the time of the visit.  We reviewed the resident's history and exam and pertinent patient test results.  I agree with the assessment, diagnosis, and plan of care documented in the resident's note. 

## 2018-09-22 NOTE — Telephone Encounter (Signed)
Please send new rx. Thanks

## 2018-09-22 NOTE — Telephone Encounter (Signed)
500mg  tabs are fine. Thanks!

## 2018-09-22 NOTE — Telephone Encounter (Signed)
Fax from [facility] - Metformin (Fortamet) 24 hr tab is not available on the IM Program. Alternatives are Metformin 500 mg ER 2 tabs PO BID or 3 tabs PO daily. Please send new rx. Thanks

## 2018-10-03 MED FILL — metFORMIN HCL ER 500 MG TB2: 500 | 30 days supply | Qty: 120 | Fill #0

## 2018-10-03 MED FILL — LANTUS SOLOSTAR 100 UNITS/M: 100 | 30 days supply | Qty: 6 | Fill #0

## 2018-10-15 ENCOUNTER — Other Ambulatory Visit: Payer: Self-pay | Admitting: Infectious Disease

## 2018-10-15 ENCOUNTER — Other Ambulatory Visit: Payer: Self-pay | Admitting: Internal Medicine

## 2018-10-15 DIAGNOSIS — E1169 Type 2 diabetes mellitus with other specified complication: Secondary | ICD-10-CM

## 2018-10-15 DIAGNOSIS — E785 Hyperlipidemia, unspecified: Principal | ICD-10-CM

## 2018-10-22 ENCOUNTER — Ambulatory Visit: Payer: Self-pay

## 2018-10-22 ENCOUNTER — Encounter: Payer: Self-pay | Admitting: *Deleted

## 2018-10-22 ENCOUNTER — Encounter: Payer: Self-pay | Admitting: Infectious Disease

## 2018-10-22 ENCOUNTER — Other Ambulatory Visit: Payer: Self-pay | Admitting: Internal Medicine

## 2018-11-01 ENCOUNTER — Other Ambulatory Visit: Payer: Self-pay | Admitting: Infectious Disease

## 2018-11-03 ENCOUNTER — Other Ambulatory Visit: Payer: Self-pay

## 2018-11-03 DIAGNOSIS — B20 Human immunodeficiency virus [HIV] disease: Secondary | ICD-10-CM

## 2018-11-04 LAB — URINE CYTOLOGY ANCILLARY ONLY
Chlamydia: NEGATIVE
Neisseria Gonorrhea: NEGATIVE

## 2018-11-05 LAB — COMPLETE METABOLIC PANEL WITH GFR
AG Ratio: 1.4 (calc) (ref 1.0–2.5)
ALT: 15 U/L (ref 9–46)
AST: 11 U/L (ref 10–35)
Albumin: 4 g/dL (ref 3.6–5.1)
Alkaline phosphatase (APISO): 63 U/L (ref 35–144)
BUN: 9 mg/dL (ref 7–25)
CO2: 30 mmol/L (ref 20–32)
Calcium: 9.4 mg/dL (ref 8.6–10.3)
Chloride: 102 mmol/L (ref 98–110)
Creat: 1.12 mg/dL (ref 0.70–1.33)
GFR, EST NON AFRICAN AMERICAN: 74 mL/min/{1.73_m2} (ref >=60)
GFR, Est African American: 86 mL/min/{1.73_m2} (ref >=60)
GLOBULIN: 2.8 g/dL (ref 1.9–3.7)
Glucose, Bld: 183 mg/dL — ABNORMAL HIGH (ref 65–99)
Potassium: 4.9 mmol/L (ref 3.5–5.3)
Sodium: 138 mmol/L (ref 135–146)
Total Bilirubin: 0.5 mg/dL (ref 0.2–1.2)
Total Protein: 6.8 g/dL (ref 6.1–8.1)

## 2018-11-05 LAB — LIPID PANEL
CHOL/HDL RATIO: 3.8 (calc) (ref <5.0)
Cholesterol: 127 mg/dL (ref <200)
HDL: 33 mg/dL — ABNORMAL LOW (ref >=40)
LDL Cholesterol (Calc): 79 mg/dL (calc)
Non-HDL Cholesterol (Calc): 94 mg/dL (calc) (ref <130)
Triglycerides: 69 mg/dL (ref <150)

## 2018-11-05 LAB — CBC WITH DIFFERENTIAL/PLATELET
Absolute Monocytes: 485 cells/uL (ref 200–950)
Basophils Absolute: 72 cells/uL (ref 0–200)
Basophils Relative: 1.5 %
Eosinophils Absolute: 130 cells/uL (ref 15–500)
Eosinophils Relative: 2.7 %
HCT: 49.4 % (ref 38.5–50.0)
Hemoglobin: 16.8 g/dL (ref 13.2–17.1)
Lymphs Abs: 2328 cells/uL (ref 850–3900)
MCH: 29.8 pg (ref 27.0–33.0)
MCHC: 34 g/dL (ref 32.0–36.0)
MCV: 87.7 fL (ref 80.0–100.0)
MPV: 9.6 fL (ref 7.5–12.5)
Monocytes Relative: 10.1 %
Neutro Abs: 1786 cells/uL (ref 1500–7800)
Neutrophils Relative %: 37.2 %
Platelets: 336 10*3/uL (ref 140–400)
RBC: 5.63 10*6/uL (ref 4.20–5.80)
RDW: 13.4 % (ref 11.0–15.0)
Total Lymphocyte: 48.5 %
WBC: 4.8 10*3/uL (ref 3.8–10.8)

## 2018-11-05 LAB — HIV-1 RNA QUANT-NO REFLEX-BLD
HIV 1 RNA QUANT: NOT DETECTED {copies}/mL
HIV-1 RNA Quant, Log: <1.3 Log copies/mL

## 2018-11-05 LAB — RPR: RPR Ser Ql: NONREACTIVE

## 2018-11-16 ENCOUNTER — Other Ambulatory Visit: Payer: Self-pay | Admitting: Infectious Disease

## 2018-11-16 ENCOUNTER — Other Ambulatory Visit: Payer: Self-pay | Admitting: Internal Medicine

## 2018-11-16 DIAGNOSIS — E1165 Type 2 diabetes mellitus with hyperglycemia: Secondary | ICD-10-CM

## 2018-11-18 NOTE — Progress Notes (Signed)
Patient: Wayne Wolfe  DOB: 11-04-1965 MRN: [id number] PCP: Lars Mage, MD    Patient Active Problem List   Diagnosis Date Noted  . Pulmonary nodule 12/26/2017  . Healthcare maintenance 10/16/2017  . Knee effusion, right 07/19/2017  . Atypical chest pain 03/20/2017  . Otitis media 03/13/2017  . Colon cancer screening 02/24/2016  . Testicular pain 01/20/2016  . Bilateral hand numbness 08/26/2013  . 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 Diabetes type 2, uncontrolled ([place]) 03/12/2011  . Hyperlipidemia associated with type 2 diabetes mellitus ([place]) 03/12/2011  . Hypertension associated with diabetes ([place]) 04/20/2008  . Human immunodeficiency virus (HIV) disease ([place]) 04/15/2008     Subjective:  Wayne Wolfe is a 53 y.o. male with HIV infection (well controlled for many years).   HPI:  He has no concerns over his health or additions to his history today. He has continued his Odefsey once a day with food as instructed and reports 100% adherence to doses. He is requesting refill on his Allegra and HCTZ today. He is not currently taking Lisinopril for his BP and has only ever taken HCTZ. He is currently working with internal medicine team to control his diabetes - last A1C is elevated as of December at 10.5%.  He has been sexually active with at least one new male partner; all sites exposed. Was treated for rectal gonorrhea in the interval with a shot and some pills. Has been more careful about using condoms since. No dysuria, rectal pain or diarrhea.    Review of Systems  Constitutional: Negative for chills, fever, malaise/fatigue and weight loss.  HENT: Positive for congestion.   Respiratory: Negative for cough, sputum production and shortness of breath.   Cardiovascular: Negative for palpitations and leg swelling.  Gastrointestinal: Negative for abdominal pain, diarrhea and vomiting.  Musculoskeletal: Negative for joint pain, myalgias and neck pain.  Skin: Negative for rash.    Neurological: Negative for headaches.  Psychiatric/Behavioral: Negative for depression and substance abuse. The patient is not nervous/anxious.     Past Medical History:  Diagnosis Date  . Dizziness 03/13/2017  . HIV (human immunodeficiency virus infection) ([place]) 2009   11/20/2012 Last CD4 count 210 and VL <20  . 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 Hypertension 2009   Well controlled on HCTZ  . Otitis media 03/13/2017  . Polyuria 03/13/2017  . Poorly controlled diabetes mellitus ([place]) 03/13/2017  . Type 2 diabetes mellitus (HCC)    Well controlled on metformin  . Weight loss 03/13/2017    Outpatient Medications Prior to Visit  Medication Sig Dispense Refill  . ACCU-CHEK FASTCLIX LANCETS MISC Check blood sugar before meals and after meals 102 each 12  . Blood Glucose Monitoring Suppl (ACCU-CHEK GUIDE) w/Device KIT 1 each by Does not apply route 6 (six) times daily. 1 kit 0  . glucose blood (ACCU-CHEK GUIDE) test strip Check blood sugar before meals and after meals 100 each 12  . Insulin Glargine (LANTUS SOLOSTAR) 100 UNIT/ML Solostar Pen Inject 20 Units into the skin daily at 10 pm. 3 mL 1  . Insulin Pen Needle (B-D UF III MINI PEN NEEDLES) 31G X 5 MM MISC Use 2 times daily to inject insulin into the skin. diag code E11.65. insulin dependent 190 each 1  . liraglutide (VICTOZA) 18 MG/3ML SOPN ADMINISTER 1.2 MG UNDER THE SKIN DAILY 6 mL 1  . metFORMIN (GLUCOPHAGE-XR) 500 MG 24 hr tablet Take 2 tablets (1,000 mg total) by mouth 2 (  two) times daily. 360 tablet 1  . pravastatin (PRAVACHOL) 40 MG tablet TAKE 1 TABLET BY MOUTH DAILY 90 tablet 0  . emtricitabine-rilpivir-tenofovir AF (ODEFSEY) 200-25-25 MG TABS tablet Take 1 tablet by mouth daily with breakfast. 30 tablet 5  . fexofenadine (ALLEGRA) 180 MG tablet Take 1 tablet (180 mg total) by mouth daily. 30 tablet 2  . hydrochlorothiazide (HYDRODIURIL) 25 MG tablet   0  . lisinopril (PRINIVIL,ZESTRIL) 10 MG tablet Take 1  tablet (10 mg total) by mouth daily. 90 tablet 0   No facility-administered medications prior to visit.      No Known Allergies  Social History   Tobacco Use  . Smoking status: Former Research scientist (life sciences)  . Smokeless tobacco: Never Used  Substance Use Topics  . Alcohol use: No    Alcohol/week: 0.0 standard drinks    Comment: Last alcohol 10 years ago, former moderate use, never binge.   . Drug use: No    Family History  Problem Relation Age of Onset  . Diabetes Mellitus II Sister   . Diabetes Mellitus II Mother   . Coronary artery disease Mother 71       CABGx6  . Coronary artery disease Sister 19       CABG  . Diabetes Mellitus II Sister   . Coronary artery disease Sister   . Diabetes Mellitus II Sister     Objective:   Vitals:   11/19/18 1018  BP: 128/89  Pulse: 85  Temp: 98.2 F (36.8 C)  Weight: 251 lb (113.9 kg)   Body mass index is 39.31 kg/m.  Physical Exam  Constitutional: He is oriented to person, place, and time and well-developed, well-nourished, and in no distress.  HENT:  Mouth/Throat: Oropharynx is clear and moist.  Eyes: Pupils are equal, round, and reactive to light. Right eye exhibits no discharge. Left eye exhibits no discharge.  Cardiovascular: Normal rate and normal heart sounds.  No murmur heard. Pulmonary/Chest: Effort normal and breath sounds normal. He has no wheezes.  Abdominal: Bowel sounds are normal. He exhibits no distension. There is no abdominal tenderness.  Musculoskeletal:        General: No edema.  Lymphadenopathy:    He has no cervical adenopathy.  Neurological: He is alert and oriented to person, place, and time.  Skin: Skin is warm and dry.   Lab Results: Lab Results  Component Value Date   WBC 4.8 11/03/2018   HGB 16.8 11/03/2018   HCT 49.4 11/03/2018   MCV 87.7 11/03/2018   PLT 336 11/03/2018    Lab Results  Component Value Date   CREATININE 1.12 11/03/2018   BUN 9 11/03/2018   NA 138 11/03/2018   K 4.9 11/03/2018    CL 102 11/03/2018   CO2 30 11/03/2018    Lab Results  Component Value Date   ALT 15 11/03/2018   AST 11 11/03/2018   ALKPHOS 65 03/13/2017   BILITOT 0.5 11/03/2018     Assessment & Plan:   Problem List Items Addressed This Visit      Unprioritized   Human immunodeficiency virus (HIV) disease ([place]) - Primary (Chronic)    Reviewed labs with him today and congratulated on good long term control. Continue odefsey. No need for OI prophylaxis.  Can return in 6 months to see Dr. Tommy Medal for follow up.   Will screen oral/rectal GC/C today per his request.       Relevant Medications   emtricitabine-rilpivir-tenofovir AF (ODEFSEY) 200-25-25 MG TABS tablet  Other Relevant Orders   HIV-1 RNA quant-no reflex-bld   RPR   Hypertension associated with diabetes ([place]) (Chronic)    Will refill his HCTZ with request to contact his PCP for further discussions. I explained to him that it would be recommended as a diabetic patient with hypertension to be on at least a low-dose ACE inhibitor. His BP is within target range today.       Relevant Medications   hydrochlorothiazide (HYDRODIURIL) 25 MG tablet    Other Visit Diagnoses    Seasonal allergic rhinitis due to pollen       Relevant Medications   fexofenadine (ALLEGRA) 180 MG tablet   Screening for STDs (sexually transmitted diseases)       Relevant Orders   Cytology (oral, anal, urethral) ancillary only   Cytology (oral, anal, urethral) ancillary only     Janene Madeira, MSN, NP-C [facility] Pager: [phone] Office: [phone]  11/20/18  8:30 AM

## 2018-11-19 ENCOUNTER — Encounter: Payer: Self-pay | Admitting: Infectious Diseases

## 2018-11-19 ENCOUNTER — Ambulatory Visit (INDEPENDENT_AMBULATORY_CARE_PROVIDER_SITE_OTHER): Payer: Self-pay | Admitting: Infectious Diseases

## 2018-11-19 ENCOUNTER — Other Ambulatory Visit: Payer: Self-pay

## 2018-11-19 VITALS — BP 128/89 | HR 85 | Temp 98.2°F | Wt 251.0 lb

## 2018-11-19 DIAGNOSIS — I10 Essential (primary) hypertension: Secondary | ICD-10-CM

## 2018-11-19 DIAGNOSIS — B20 Human immunodeficiency virus [HIV] disease: Secondary | ICD-10-CM

## 2018-11-19 DIAGNOSIS — Z113 Encounter for screening for infections with a predominantly sexual mode of transmission: Secondary | ICD-10-CM

## 2018-11-19 DIAGNOSIS — E1159 Type 2 diabetes mellitus with other circulatory complications: Secondary | ICD-10-CM

## 2018-11-19 DIAGNOSIS — I152 Hypertension secondary to endocrine disorders: Secondary | ICD-10-CM

## 2018-11-19 DIAGNOSIS — J301 Allergic rhinitis due to pollen: Secondary | ICD-10-CM

## 2018-11-19 MED ORDER — HYDROCHLOROTHIAZIDE 25 MG PO TABS: 25.0000 mg | ORAL_TABLET | Freq: Every day | ORAL | 2 refills | Status: DC

## 2018-11-19 MED ORDER — EMTRICITAB-RILPIVIR-TENOFOV AF 200-25-25 MG PO TABS: 1.0000 | ORAL_TABLET | Freq: Every day | ORAL | 5 refills | Status: DC

## 2018-11-19 MED ORDER — FEXOFENADINE HCL 180 MG PO TABS: 180.0000 mg | ORAL_TABLET | Freq: Every day | ORAL | 2 refills | Status: DC

## 2018-11-19 NOTE — Patient Instructions (Addendum)
Will send in refills for your Allegra and your HCTZ.   Will call you with results of your tests today.  Please continue your Odefsey every day with food as you are currently.   Please come back in 6 months to see Dr. Tommy Medal with labs prior to

## 2018-11-19 NOTE — Assessment & Plan Note (Addendum)
Will refill his HCTZ with request to contact his PCP for further discussions. I explained to him that it would be recommended as a diabetic patient with hypertension to be on at least a low-dose ACE inhibitor. His BP is within target range today.

## 2018-11-20 NOTE — Assessment & Plan Note (Signed)
Reviewed labs with him today and congratulated on good long term control. Continue odefsey. No need for OI prophylaxis.  Can return in 6 months to see Dr. Tommy Medal for follow up.   Will screen oral/rectal GC/C today per his request.

## 2018-11-20 NOTE — Assessment & Plan Note (Signed)
" >>  ASSESSMENT AND PLAN FOR HUMAN IMMUNODEFICIENCY VIRUS (HIV) DISEASE (HCC) WRITTEN ON 11/20/2018  8:29 AM BY Tyesha Joffe, COREAN SAILOR, NP  Reviewed labs with him today and congratulated on good long term control. Continue odefsey . No need for OI prophylaxis.  Can return in 6 months to see Dr. Fleeta Rothman for follow up.   Will screen oral/rectal GC/C today per his request.  "

## 2018-11-21 LAB — CYTOLOGY, (ORAL, ANAL, URETHRAL) ANCILLARY ONLY
Chlamydia: NEGATIVE
Chlamydia: NEGATIVE
Neisseria Gonorrhea: NEGATIVE
Neisseria Gonorrhea: NEGATIVE

## 2018-11-21 NOTE — Progress Notes (Signed)
Please call Wayne Wolfe to let him know that his swabs were all negative and his previous infection has been cured.

## 2018-11-24 ENCOUNTER — Telehealth: Payer: Self-pay

## 2018-11-24 NOTE — Telephone Encounter (Signed)
Wayne Wolfe with negative results, per Mare Loan NP. Discussed results with Shanon Brow after verified identification.   Patient had no questions or concerns.   Lenore Cordia, [place]

## 2018-11-24 NOTE — Telephone Encounter (Signed)
-----   Message from  [place], NP sent at 11/21/2018 11:17 AM EST ----- Please call Wayne Wolfe to let him know that his swabs were all negative and his previous infection has been cured.

## 2018-12-02 ENCOUNTER — Encounter: Payer: Self-pay | Admitting: Infectious Disease

## 2018-12-02 ENCOUNTER — Ambulatory Visit (INDEPENDENT_AMBULATORY_CARE_PROVIDER_SITE_OTHER): Payer: Self-pay | Admitting: Infectious Disease

## 2018-12-02 VITALS — BP 123/86 | HR 98 | Temp 98.0°F | Wt 240.0 lb

## 2018-12-02 DIAGNOSIS — I152 Hypertension secondary to endocrine disorders: Secondary | ICD-10-CM

## 2018-12-02 DIAGNOSIS — B20 Human immunodeficiency virus [HIV] disease: Secondary | ICD-10-CM

## 2018-12-02 DIAGNOSIS — I10 Essential (primary) hypertension: Secondary | ICD-10-CM

## 2018-12-02 DIAGNOSIS — E1159 Type 2 diabetes mellitus with other circulatory complications: Secondary | ICD-10-CM

## 2018-12-02 DIAGNOSIS — R6889 Other general symptoms and signs: Secondary | ICD-10-CM

## 2018-12-02 HISTORY — DX: Other general symptoms and signs: R68.89

## 2018-12-02 MED FILL — metFORMIN HCL ER 500 MG TB2: 500 | 30 days supply | Qty: 120 | Fill #1

## 2018-12-02 MED FILL — UNIFINE PENTIPS 8MM 31G: 31G X 8 MM | 30 days supply | Qty: 30 | Fill #0

## 2018-12-02 NOTE — Progress Notes (Signed)
Chief complaint: Upper respiratory tract infection  Subjective:    Patient ID: Wayne Wolfe, male    DOB: 21-Sep-1966, 53 y.o.   MRN: [id number]  HIV Positive/AIDS    53 year old man previously perfectly suppressed on Prezista Norvir and Truvada whom I changed over to Atripla and then to Complera to [facility], excellent virological control.  Wayne Wolfe came to clinic today after a one-week history of sinus congestion sore throat productive cough and drenching sweats at night which have started to improve along with improvement in his cough.  He wanted to come in to be checking if he needed an antibiotic just in case.  I think he very well may well may have suffered from influenza based on the symptoms that he is describing.  I coached them in the future we should handle that his symptoms over the phone and if he has a evidence of symptoms consistent with influenza we should simply empirically treat him with Tamiflu rather than have him come to the clinic when he is sick and potentially could spread virus to other particular in the context of new novel CoVID SARS 2 virus.      Past Medical History:  Diagnosis Date  . Dizziness 03/13/2017  . HIV (human immunodeficiency virus infection) ([place]) 2009   11/20/2012 Last CD4 count 210 and VL <20  . 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 Hypertension 2009   Well controlled on HCTZ  . Otitis media 03/13/2017  . Polyuria 03/13/2017  . Poorly controlled diabetes mellitus ([place]) 03/13/2017  . Type 2 diabetes mellitus (HCC)    Well controlled on metformin  . Weight loss 03/13/2017    Past Surgical History:  Procedure Laterality Date  . CARPAL TUNNEL RELEASE Right 1990s  . none      Family History  Problem Relation Age of Onset  . Diabetes Mellitus II Sister   . Diabetes Mellitus II Mother   . Coronary artery disease Mother 74       CABGx6  . Coronary artery disease Sister 56       CABG  . Diabetes Mellitus II Sister   .  Coronary artery disease Sister   . Diabetes Mellitus II Sister       Social History   Socioeconomic History  . Marital status: Single    Spouse name: Not on file  . Number of children: Not on file  . Years of education: 67  . Highest education level: Not on file  Occupational History    Employer: UNEMPLOYED  Social Needs  . Financial resource strain: Not on file  . Food insecurity:    Worry: Not on file    Inability: Not on file  . Transportation needs:    Medical: Not on file    Non-medical: Not on file  Tobacco Use  . Smoking status: Former Research scientist (life sciences)  . Smokeless tobacco: Never Used  Substance and Sexual Activity  . Alcohol use: No    Alcohol/week: 0.0 standard drinks    Comment: Last alcohol 10 years ago, former moderate use, never binge.   . Drug use: No  . Sexual activity: Not on file  Lifestyle  . Physical activity:    Days per week: Not on file    Minutes per session: Not on file  . Stress: Not on file  Relationships  . Social connections:    Talks on phone: Not on file    Gets together: Not on file    Attends  religious service: Not on file    Active member of club or organization: Not on file    Attends meetings of clubs or organizations: Not on file    Relationship status: Not on file  Other Topics Concern  . Not on file  Social History Narrative   On disability. Lives in [place].     No Known Allergies   Current Outpatient Medications:  .  ACCU-CHEK FASTCLIX LANCETS MISC, Check blood sugar before meals and after meals, Disp: 102 each, Rfl: 12 .  Blood Glucose Monitoring Suppl (ACCU-CHEK GUIDE) w/Device KIT, 1 each by Does not apply route 6 (six) times daily., Disp: 1 kit, Rfl: 0 .  emtricitabine-rilpivir-tenofovir AF (ODEFSEY) 200-25-25 MG TABS tablet, Take 1 tablet by mouth daily with breakfast., Disp: 30 tablet, Rfl: 5 .  fexofenadine (ALLEGRA) 180 MG tablet, Take 1 tablet (180 mg total) by mouth daily., Disp: 30 tablet, Rfl: 2 .  glucose blood  (ACCU-CHEK GUIDE) test strip, Check blood sugar before meals and after meals, Disp: 100 each, Rfl: 12 .  hydrochlorothiazide (HYDRODIURIL) 25 MG tablet, Take 1 tablet (25 mg total) by mouth daily., Disp: 30 tablet, Rfl: 2 .  Insulin Glargine (LANTUS SOLOSTAR) 100 UNIT/ML Solostar Pen, Inject 20 Units into the skin daily at 10 pm., Disp: 3 mL, Rfl: 1 .  Insulin Pen Needle (B-D UF III MINI PEN NEEDLES) 31G X 5 MM MISC, Use 2 times daily to inject insulin into the skin. diag code E11.65. insulin dependent, Disp: 190 each, Rfl: 1 .  liraglutide (VICTOZA) 18 MG/3ML SOPN, ADMINISTER 1.2 MG UNDER THE SKIN DAILY, Disp: 6 mL, Rfl: 1 .  lisinopril (PRINIVIL,ZESTRIL) 10 MG tablet, Take 1 tablet (10 mg total) by mouth daily., Disp: 90 tablet, Rfl: 0 .  metFORMIN (GLUCOPHAGE-XR) 500 MG 24 hr tablet, Take 2 tablets (1,000 mg total) by mouth 2 (two) times daily., Disp: 360 tablet, Rfl: 1 .  pravastatin (PRAVACHOL) 40 MG tablet, TAKE 1 TABLET BY MOUTH DAILY, Disp: 90 tablet, Rfl: 0    Review of Systems  Constitutional: Positive for chills, fatigue and fever. Negative for activity change, appetite change and diaphoresis.  HENT: Positive for congestion, rhinorrhea, sinus pain and sore throat. Negative for sinus pressure, sneezing and trouble swallowing.   Eyes: Negative for photophobia and visual disturbance.  Respiratory: Positive for cough. Negative for chest tightness, shortness of breath, wheezing and stridor.   Cardiovascular: Negative for chest pain, palpitations and leg swelling.  Gastrointestinal: Negative for abdominal distention, abdominal pain, anal bleeding, blood in stool, constipation, diarrhea, nausea and vomiting.  Genitourinary: Negative for difficulty urinating, dysuria, flank pain and hematuria.  Musculoskeletal: Negative for arthralgias, back pain, gait problem, joint swelling and myalgias.  Skin: Negative for color change, pallor, rash and wound.  Neurological: Negative for tremors, weakness  and light-headedness.  Hematological: Negative for adenopathy. Does not bruise/bleed easily.  Psychiatric/Behavioral: Negative for agitation, behavioral problems, confusion, dysphoric mood and sleep disturbance.       Objective:   Physical Exam Constitutional:      General: He is not in acute distress.    Appearance: He is well-developed. He is not diaphoretic.  HENT:     Head: Normocephalic and atraumatic.      Right Ear: Swelling present. A middle ear effusion is present. Tympanic membrane is erythematous.     Left Ear: A middle ear effusion is present.     Nose: No rhinorrhea.     Mouth/Throat:     Pharynx: No oropharyngeal  exudate.  Eyes:     General: No scleral icterus.    Extraocular Movements: Extraocular movements intact.     Conjunctiva/sclera: Conjunctivae normal.     Pupils: Pupils are equal, round, and reactive to light.  Neck:     Musculoskeletal: Normal range of motion and neck supple.     Vascular: No JVD.  Cardiovascular:     Rate and Rhythm: Normal rate.     Heart sounds: Normal heart sounds. No murmur. No friction rub. No gallop.   Pulmonary:     Effort: Pulmonary effort is normal. No respiratory distress.     Breath sounds: Normal breath sounds. No stridor. No wheezing, rhonchi or rales.  Chest:     Chest wall: No tenderness.  Abdominal:     General: Bowel sounds are normal. There is no distension.  Musculoskeletal:        General: No tenderness.  Lymphadenopathy:     Cervical: No cervical adenopathy.  Skin:    General: Skin is warm and dry.     Coloration: Skin is not pale.     Findings: No erythema.  Neurological:     Mental Status: He is alert and oriented to person, place, and time.     Motor: No abnormal muscle tone.     Coordination: Coordination normal.  Psychiatric:        Behavior: Behavior normal.        Thought Content: Thought content normal.        Judgment: Judgment normal.           Assessment & Plan:   Influenza-like  illness: Too late to make a difference with Tamiflu if there was in fact influenza.  Encouraged him in future to let get in touch with [place] over the phone when he is acutely sick so that we could institute Tamiflu for possible influenza and to avoid having him come to the clinic where flu and other contagious respiratory viruses can be spread unnecessarily.  HIV: continue ODEFSEY he is renewed his HMA P we can do this again in July  DM: A1c is dropped to 10 but needs to improve further.  HTN: Defer to primary care : There were no vitals filed for this visit.  Hyperlipidemia: at goal  Lipid Panel     Component Value Date/Time   CHOL 127 11/03/2018 1108   TRIG 69 11/03/2018 1108   HDL 33 (L) 11/03/2018 1108   CHOLHDL 3.8 11/03/2018 1108   VLDL 23 07/26/2016 1005   LDLCALC 79 11/03/2018 1108

## 2018-12-04 ENCOUNTER — Other Ambulatory Visit: Payer: Self-pay

## 2018-12-18 ENCOUNTER — Encounter: Payer: Self-pay | Admitting: Infectious Disease

## 2018-12-19 ENCOUNTER — Encounter: Payer: Self-pay | Admitting: Internal Medicine

## 2019-01-06 ENCOUNTER — Telehealth (INDEPENDENT_AMBULATORY_CARE_PROVIDER_SITE_OTHER): Payer: Self-pay | Admitting: *Deleted

## 2019-01-06 DIAGNOSIS — I10 Essential (primary) hypertension: Secondary | ICD-10-CM

## 2019-01-06 NOTE — Telephone Encounter (Signed)
NURSING TRIAGE NOTE FOR RESPIRATORY SYMPTOMS  Do you have a fever? "idont know, I havent checked it, sometimes my chest sweats"   Do you have a cough? "yes, sometimes a little dry cough, started a couple of days ago"  Do you have shortness of breath more than normal? "yes, a couple of days ago but it stopped last night and I havent had any today either"  Do you have chest pain?"no, I dont think"  Are you able to eat and drink normally? "yes"  Have you seen a physician for these symptoms? "no"  Action keep hands clean, surfaces clean, drink plenty of liquids paying attention to diabetes and carbs, stay in away from crowds, have someone to shop for you, if you develop severe shortness of breath or chest pain call 911  I informed patient to expect a phone call from a physician soon and I sent request to front desk to schedule a virtual office appt for patient and arrive the patient.

## 2019-01-06 NOTE — Telephone Encounter (Signed)
Medicine attending: Medical history, presenting problems,  and medications, reviewed with resident physician Dr Justin Helberg on the day of the patient telephone consultation and I concur with his evaluation and management plan. 

## 2019-01-06 NOTE — Telephone Encounter (Signed)
[facility] Telephone triage for respiratory symptoms  This is a telephone encounter between [employer] and [employer] on 01/06/2019. The visit was conducted with the patient located at home and myself at [facility]. The patient's identity was confirmed using their DOB and current address. The patient has consented to being evaluated through a telephone encounter and understands the associated risks (an examination cannot be done and the patient may need to come in for an appointment) / benefits (allows the patient to remain at home, decreasing exposure to coronavirus).   Do you have a fever? No  Do you have a cough? Yes - non-productive Do you have shortness of breath more than normal? No Do you have chest pain?No Are you able to eat and drink normally? Yes Have you seen a physician for these symptoms? No  I have reviewed the patients PMHx and medications.  Does the patient belong to an at risk population? (41 yrs age or older, chronic disease (cardiac, pulmonary, renal, severe obesity BMI >40, liver, diabetes, malignancy), immunocompromised, or pregnancy or within weeks after delivery / postpartum) Yes - HIV  Patient appears to have had no change in baseline functional status. Based on the patient's age and comorbidies they are at moderate risk for further decompensation.   Patient has been instructed to Stay at home and provided general health advice including the need to remain in quarantine for at least 3 days after the fever has resolved without use of fever reducing medicines and respiratory symptoms have improved BUT for at least 7 days. Patient to phone if symptoms worsen or call 911 if condition becomes unstable.   The patient does not need follow-up.   This visit was conducted using telehealth. I have personally spent 6 minutes discussing the patient's current clinical status, reviewing necessary PMHx/medications, and ensure adequate understanding of their current medical illness.   MEDICAID    99441 + CR modifier 5-10 minutes  99442 + CR modifier 11-20 minutes  99443 + CR modifier 21-30 minutes   All other payors, 9921x + CR modifier +/- GE modifier  Cough off and on for a couple days with "chest sweating." Sputum is clear  No fevers, chills, N/V, abdominal pain, chest pain, SHOB, diarrhea Eating and drinking like normal Not been around anyone that is sick  On disability, stays at home as much as possible  Seen for Flu-like illness about 1 months No a lot of drainage in the back of throat  Social distancing   7 minutes

## 2019-01-06 NOTE — Telephone Encounter (Signed)
Pt called by Dr Tarri Abernethy who signed out to me

## 2019-01-11 ENCOUNTER — Other Ambulatory Visit: Payer: Self-pay | Admitting: Internal Medicine

## 2019-01-11 ENCOUNTER — Other Ambulatory Visit: Payer: Self-pay | Admitting: Infectious Disease

## 2019-01-11 ENCOUNTER — Other Ambulatory Visit: Payer: Self-pay | Admitting: Family

## 2019-01-11 DIAGNOSIS — B20 Human immunodeficiency virus [HIV] disease: Secondary | ICD-10-CM

## 2019-01-11 DIAGNOSIS — E785 Hyperlipidemia, unspecified: Principal | ICD-10-CM

## 2019-01-11 DIAGNOSIS — E1165 Type 2 diabetes mellitus with hyperglycemia: Secondary | ICD-10-CM

## 2019-01-11 DIAGNOSIS — E1169 Type 2 diabetes mellitus with other specified complication: Secondary | ICD-10-CM

## 2019-01-12 ENCOUNTER — Emergency Department (HOSPITAL_COMMUNITY): Payer: HRSA Program

## 2019-01-12 ENCOUNTER — Other Ambulatory Visit: Payer: Self-pay | Admitting: *Deleted

## 2019-01-12 ENCOUNTER — Observation Stay (HOSPITAL_COMMUNITY)
Admission: EM | Admit: 2019-01-12 | Discharge: 2019-01-13 | Disposition: A | Discharge status: Home / Self Care | Payer: HRSA Program | Attending: Internal Medicine | Admitting: Internal Medicine

## 2019-01-12 ENCOUNTER — Encounter (HOSPITAL_COMMUNITY): Payer: Self-pay | Admitting: Family Medicine

## 2019-01-12 ENCOUNTER — Telehealth: Payer: Self-pay | Admitting: *Deleted

## 2019-01-12 ENCOUNTER — Other Ambulatory Visit: Payer: Self-pay

## 2019-01-12 DIAGNOSIS — R9431 Abnormal electrocardiogram [ECG] [EKG]: Secondary | ICD-10-CM | POA: Insufficient documentation

## 2019-01-12 DIAGNOSIS — Z79899 Other long term (current) drug therapy: Secondary | ICD-10-CM | POA: Insufficient documentation

## 2019-01-12 DIAGNOSIS — Z87891 Personal history of nicotine dependence: Secondary | ICD-10-CM | POA: Insufficient documentation

## 2019-01-12 DIAGNOSIS — Z833 Family history of diabetes mellitus: Secondary | ICD-10-CM | POA: Insufficient documentation

## 2019-01-12 DIAGNOSIS — A419 Sepsis, unspecified organism: Secondary | ICD-10-CM | POA: Diagnosis present

## 2019-01-12 DIAGNOSIS — E119 Type 2 diabetes mellitus without complications: Secondary | ICD-10-CM | POA: Diagnosis present

## 2019-01-12 DIAGNOSIS — J181 Lobar pneumonia, unspecified organism: Secondary | ICD-10-CM | POA: Diagnosis present

## 2019-01-12 DIAGNOSIS — I10 Essential (primary) hypertension: Secondary | ICD-10-CM | POA: Diagnosis not present

## 2019-01-12 DIAGNOSIS — R6889 Other general symptoms and signs: Secondary | ICD-10-CM

## 2019-01-12 DIAGNOSIS — E1165 Type 2 diabetes mellitus with hyperglycemia: Secondary | ICD-10-CM | POA: Diagnosis present

## 2019-01-12 DIAGNOSIS — Z794 Long term (current) use of insulin: Secondary | ICD-10-CM | POA: Insufficient documentation

## 2019-01-12 DIAGNOSIS — E785 Hyperlipidemia, unspecified: Secondary | ICD-10-CM | POA: Insufficient documentation

## 2019-01-12 DIAGNOSIS — B20 Human immunodeficiency virus [HIV] disease: Secondary | ICD-10-CM | POA: Diagnosis present

## 2019-01-12 DIAGNOSIS — E1159 Type 2 diabetes mellitus with other circulatory complications: Secondary | ICD-10-CM

## 2019-01-12 DIAGNOSIS — E876 Hypokalemia: Secondary | ICD-10-CM | POA: Diagnosis not present

## 2019-01-12 DIAGNOSIS — J9601 Acute respiratory failure with hypoxia: Secondary | ICD-10-CM | POA: Diagnosis not present

## 2019-01-12 DIAGNOSIS — I152 Hypertension secondary to endocrine disorders: Secondary | ICD-10-CM | POA: Diagnosis present

## 2019-01-12 DIAGNOSIS — J1289 Other viral pneumonia: Secondary | ICD-10-CM | POA: Insufficient documentation

## 2019-01-12 DIAGNOSIS — Z8249 Family history of ischemic heart disease and other diseases of the circulatory system: Secondary | ICD-10-CM | POA: Insufficient documentation

## 2019-01-12 DIAGNOSIS — E1169 Type 2 diabetes mellitus with other specified complication: Secondary | ICD-10-CM | POA: Diagnosis present

## 2019-01-12 DIAGNOSIS — Z8619 Personal history of other infectious and parasitic diseases: Secondary | ICD-10-CM | POA: Diagnosis present

## 2019-01-12 DIAGNOSIS — Z8616 Personal history of COVID-19: Secondary | ICD-10-CM | POA: Diagnosis present

## 2019-01-12 HISTORY — DX: Hypokalemia: E87.6

## 2019-01-12 HISTORY — DX: Acute respiratory failure with hypoxia: J96.01

## 2019-01-12 HISTORY — DX: Lobar pneumonia, unspecified organism: J18.1

## 2019-01-12 LAB — COMPREHENSIVE METABOLIC PANEL
ALT: 23 U/L (ref 0–44)
AST: 20 U/L (ref 15–41)
Albumin: 3.5 g/dL (ref 3.5–5.0)
Alkaline Phosphatase: 48 U/L (ref 38–126)
Anion gap: 10 (ref 5–15)
BUN: 12 mg/dL (ref 6–20)
CO2: 32 mmol/L (ref 22–32)
Calcium: 8.5 mg/dL — ABNORMAL LOW (ref 8.9–10.3)
Chloride: 94 mmol/L — ABNORMAL LOW (ref 98–111)
Creatinine, Ser: 1.11 mg/dL (ref 0.61–1.24)
GFR calc Af Amer: >60 mL/min (ref >60)
GFR calc non Af Amer: >60 mL/min (ref >60)
Glucose, Bld: 259 mg/dL — ABNORMAL HIGH (ref 70–99)
Potassium: 3.2 mmol/L — ABNORMAL LOW (ref 3.5–5.1)
Sodium: 136 mmol/L (ref 135–145)
Total Bilirubin: 0.5 mg/dL (ref 0.3–1.2)
Total Protein: 7.7 g/dL (ref 6.5–8.1)

## 2019-01-12 LAB — CBC WITH DIFFERENTIAL/PLATELET
Abs Immature Granulocytes: 0.03 10*3/uL (ref 0.00–0.07)
Basophils Absolute: 0 10*3/uL (ref 0.0–0.1)
Basophils Relative: 1 %
Eosinophils Absolute: 0.1 10*3/uL (ref 0.0–0.5)
Eosinophils Relative: 1 %
HCT: 50.1 % (ref 39.0–52.0)
Hemoglobin: 16.5 g/dL (ref 13.0–17.0)
Immature Granulocytes: 1 %
Lymphocytes Relative: 28 %
Lymphs Abs: 1.7 10*3/uL (ref 0.7–4.0)
MCH: 29 pg (ref 26.0–34.0)
MCHC: 32.9 g/dL (ref 30.0–36.0)
MCV: 88 fL (ref 80.0–100.0)
Monocytes Absolute: 0.3 10*3/uL (ref 0.1–1.0)
Monocytes Relative: 5 %
Neutro Abs: 4.1 10*3/uL (ref 1.7–7.7)
Neutrophils Relative %: 64 %
Platelets: 360 10*3/uL (ref 150–400)
RBC: 5.69 MIL/uL (ref 4.22–5.81)
RDW: 13.4 % (ref 11.5–15.5)
WBC: 6.2 10*3/uL (ref 4.0–10.5)
nRBC: 0 % (ref 0.0–0.2)

## 2019-01-12 LAB — D-DIMER, QUANTITATIVE: D-Dimer, Quant: 0.41 ug/mL-FEU (ref 0.00–0.50)

## 2019-01-12 LAB — TROPONIN I: Troponin I: <0.03 ng/mL (ref <0.03)

## 2019-01-12 MED ORDER — HYDROXYZINE HCL 10 MG PO TABS: 10.0000 mg | ORAL_TABLET | Freq: Three times a day (TID) | ORAL | Status: DC | PRN

## 2019-01-12 MED ORDER — EMTRICITAB-RILPIVIR-TENOFOV AF 200-25-25 MG PO TABS
1.0000 | ORAL_TABLET | Freq: Every day | ORAL | Status: DC
  Administered 2019-01-13: 1 via ORAL
  Filled 2019-01-12: qty 1

## 2019-01-12 MED ORDER — SODIUM CHLORIDE 0.9 % IV SOLN
1.0000 g | Freq: Once | INTRAVENOUS | Status: AC
  Administered 2019-01-12: 1 g via INTRAVENOUS
  Filled 2019-01-12: qty 10

## 2019-01-12 MED ORDER — BENZONATATE 100 MG PO CAPS
100.0000 mg | ORAL_CAPSULE | Freq: Once | ORAL | Status: AC
  Administered 2019-01-12: 100 mg via ORAL
  Filled 2019-01-12: qty 1

## 2019-01-12 MED ORDER — SODIUM CHLORIDE 0.9 % IV SOLN
1.0000 g | INTRAVENOUS | Status: DC
  Administered 2019-01-13: 1 g via INTRAVENOUS
  Filled 2019-01-12: qty 1

## 2019-01-12 MED ORDER — LORATADINE 10 MG PO TABS
10.0000 mg | ORAL_TABLET | Freq: Every day | ORAL | Status: DC
  Administered 2019-01-13: 10 mg via ORAL
  Filled 2019-01-12: qty 1

## 2019-01-12 MED ORDER — POTASSIUM CHLORIDE CRYS ER 20 MEQ PO TBCR
40.0000 meq | EXTENDED_RELEASE_TABLET | Freq: Once | ORAL | Status: AC
  Administered 2019-01-13: 01:00:00 40 meq via ORAL
  Filled 2019-01-12: qty 2

## 2019-01-12 MED ORDER — INSULIN ASPART 100 UNIT/ML ~~LOC~~ SOLN
0.0000 [IU] | Freq: Three times a day (TID) | SUBCUTANEOUS | Status: DC
  Administered 2019-01-13: 5 [IU] via SUBCUTANEOUS
  Administered 2019-01-13: 2 [IU] via SUBCUTANEOUS

## 2019-01-12 MED ORDER — HYDRALAZINE HCL 20 MG/ML IJ SOLN: 5.0000 mg | INTRAMUSCULAR | Status: DC | PRN

## 2019-01-12 MED ORDER — SODIUM CHLORIDE 0.9 % IV SOLN: 500.0000 mg | INTRAVENOUS | Status: DC

## 2019-01-12 MED ORDER — ENOXAPARIN SODIUM 40 MG/0.4ML ~~LOC~~ SOLN
40.0000 mg | Freq: Every day | SUBCUTANEOUS | Status: DC
  Administered 2019-01-13: 40 mg via SUBCUTANEOUS
  Filled 2019-01-12: qty 0.4

## 2019-01-12 MED ORDER — ACETAMINOPHEN 325 MG PO TABS: 650.0000 mg | ORAL_TABLET | Freq: Four times a day (QID) | ORAL | Status: DC | PRN

## 2019-01-12 MED ORDER — AZITHROMYCIN 250 MG PO TABS
500.0000 mg | ORAL_TABLET | Freq: Once | ORAL | Status: AC
  Administered 2019-01-12: 500 mg via ORAL
  Filled 2019-01-12: qty 2

## 2019-01-12 MED ORDER — IPRATROPIUM BROMIDE HFA 17 MCG/ACT IN AERS
2.0000 | INHALATION_SPRAY | RESPIRATORY_TRACT | Status: DC
  Administered 2019-01-13 (×3): 2 via RESPIRATORY_TRACT
  Filled 2019-01-12: qty 12.9

## 2019-01-12 MED ORDER — INSULIN GLARGINE 100 UNIT/ML ~~LOC~~ SOLN
15.0000 [IU] | Freq: Every day | SUBCUTANEOUS | Status: DC
  Administered 2019-01-13: 15 [IU] via SUBCUTANEOUS
  Filled 2019-01-12 (×2): qty 0.15

## 2019-01-12 MED ORDER — PRAVASTATIN SODIUM 20 MG PO TABS: 40.0000 mg | ORAL_TABLET | Freq: Every day | ORAL | Status: DC

## 2019-01-12 MED ORDER — LEVALBUTEROL TARTRATE 45 MCG/ACT IN AERO: 2.0000 | INHALATION_SPRAY | Freq: Four times a day (QID) | RESPIRATORY_TRACT | Status: DC | PRN

## 2019-01-12 MED ORDER — EMTRICITAB-RILPIVIR-TENOFOV AF 200-25-25 MG PO TABS: 1.0000 | ORAL_TABLET | Freq: Every day | ORAL | 5 refills | Status: DC

## 2019-01-12 MED ORDER — DM-GUAIFENESIN ER 30-600 MG PO TB12: 1.0000 | ORAL_TABLET | Freq: Two times a day (BID) | ORAL | Status: DC | PRN

## 2019-01-12 MED ORDER — SODIUM CHLORIDE 0.9 % IV BOLUS
500.0000 mL | Freq: Once | INTRAVENOUS | Status: AC
  Administered 2019-01-12: 500 mL via INTRAVENOUS

## 2019-01-12 NOTE — ED Notes (Signed)
Patient tolerated ambulation around the room several times well. O2 saturations remained in the upper 90's during ambulation.

## 2019-01-12 NOTE — ED Provider Notes (Signed)
[facility] DEPT Provider Note   CSN: [id number] Arrival date & time: 01/12/19  1805    History   Chief Complaint Chief Complaint  Patient presents with  . Fever  . Cough  . Shortness of Breath  . Diarrhea    HPI Wayne Wolfe is a 53 y.o. male.     The history is provided by the patient and medical records. No language interpreter was used.  Fever  Associated symptoms: cough and diarrhea   Cough  Associated symptoms: fever and shortness of breath   Shortness of Breath  Associated symptoms: cough and fever   Diarrhea  Associated symptoms: fever    Wayne Wolfe is a 53 y.o. male who presents to the Emergency Department complaining of fever, cough, sob. He presents to the emergency department for evaluation of cough and shortness of breath that began four days ago. He complains of associated diarrhea that began yesterday as well as your appetite and decreased taste that began about three days ago. He has body aches as well as a temperature to 100.2 at home. He has a history of HIV, hypertension, diabetes. He is compliant with his home medications. He has no known sick contacts. He also complains of left sided chest pain that comes and goes. He denies any leg swelling or pain, abdominal pain. No prior similar symptoms. Past Medical History:  Diagnosis Date  . Dizziness 03/13/2017  . Flu-like symptoms 12/02/2018  . HIV (human immunodeficiency virus infection) ([place]) 2009   11/20/2012 Last CD4 count 210 and VL <20  . 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 Hypertension 2009   Well controlled on HCTZ  . Otitis media 03/13/2017  . Polyuria 03/13/2017  . Poorly controlled diabetes mellitus ([place]) 03/13/2017  . Type 2 diabetes mellitus ([facility])    Well controlled on metformin  . Weight loss 03/13/2017    Patient Active Problem List   Diagnosis Date Noted  . Lobar pneumonia ([place]) 01/12/2019  . Hypokalemia 01/12/2019  . Suspected  Covid-19 Virus Infection 01/12/2019  . Acute respiratory failure with hypoxia ([place]) 01/12/2019  . Flu-like symptoms 12/02/2018  . Pulmonary nodule 12/26/2017  . Healthcare maintenance 10/16/2017  . Knee effusion, right 07/19/2017  . Atypical chest pain 03/20/2017  . Otitis media 03/13/2017  . Colon cancer screening 02/24/2016  . Testicular pain 01/20/2016  . Bilateral hand numbness 08/26/2013  . 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 Diabetes type 2, uncontrolled ([place]) 03/12/2011  . Hyperlipidemia associated with type 2 diabetes mellitus ([place]) 03/12/2011  . Hypertension associated with diabetes ([place]) 04/20/2008  . Human immunodeficiency virus (HIV) disease ([place]) 04/15/2008    Past Surgical History:  Procedure Laterality Date  . CARPAL TUNNEL RELEASE Right 1990s  . none          Home Medications    Prior to Admission medications   Medication Sig Start Date End Date Taking? Authorizing Provider  acetaminophen (TYLENOL) 500 MG tablet Take 1,000 mg by mouth every 6 (six) hours as needed for fever.   Yes [provider]  emtricitabine-rilpivir-tenofovir AF (ODEFSEY) 200-25-25 MG TABS tablet Take 1 tablet by mouth daily with breakfast. 01/12/19  Yes [place] Callas, NP  fexofenadine (ALLEGRA) 180 MG tablet Take 1 tablet (180 mg total) by mouth daily. 11/19/18  Yes [place] Callas, NP  hydrochlorothiazide (HYDRODIURIL) 25 MG tablet Take 1 tablet (25 mg total) by mouth daily. 11/19/18  Yes [place] Callas, NP  Insulin Glargine (LANTUS  SOLOSTAR) 100 UNIT/ML Solostar Pen Inject 20 Units into the skin daily at 10 pm. 09/19/18  Yes Chundi, Vahini, MD  liraglutide (VICTOZA) 18 MG/3ML SOPN ADMINISTER 1.2 MG UNDER THE SKIN DAILY Patient taking differently: Inject 1.2 mg into the skin daily.  01/12/19  Yes Annia Belt, MD  metFORMIN (GLUCOPHAGE-XR) 500 MG 24 hr tablet Take 2 tablets (1,000 mg total) by mouth 2 (two) times daily. 09/22/18 01/12/19 Yes Chundi, Vahini, MD  pravastatin  (PRAVACHOL) 40 MG tablet TAKE 1 TABLET BY MOUTH DAILY 01/12/19  Yes Tommy Medal, Lavell Islam, MD  ACCU-CHEK FASTCLIX LANCETS MISC Check blood sugar before meals and after meals 10/24/17   Chundi, Verne Spurr, MD  Blood Glucose Monitoring Suppl (ACCU-CHEK GUIDE) w/Device KIT 1 each by Does not apply route 6 (six) times daily. 10/24/17   Chundi, Verne Spurr, MD  glucose blood (ACCU-CHEK GUIDE) test strip Check blood sugar before meals and after meals 10/24/17   Chundi, Vahini, MD  Insulin Pen Needle (B-D UF III MINI PEN NEEDLES) 31G X 5 MM MISC Use 2 times daily to inject insulin into the skin. diag code E11.65. insulin dependent 11/18/18   Chundi, Verne Spurr, MD  lisinopril (PRINIVIL,ZESTRIL) 10 MG tablet Take 1 tablet (10 mg total) by mouth daily. Patient not taking: Reported on 01/12/2019 07/04/18 10/02/18  Lars Mage, MD    Family History Family History  Problem Relation Age of Onset  . Diabetes Mellitus II Sister   . Diabetes Mellitus II Mother   . Coronary artery disease Mother 86       CABGx6  . Coronary artery disease Sister 52       CABG  . Diabetes Mellitus II Sister   . Coronary artery disease Sister   . Diabetes Mellitus II Sister     Social History Social History   Tobacco Use  . Smoking status: Former Research scientist (life sciences)  . Smokeless tobacco: Never Used  Substance Use Topics  . Alcohol use: No    Alcohol/week: 0.0 standard drinks    Comment: Last used 2004  . Drug use: No    Comment: Last used 2003     Allergies   Patient has no known allergies.   Review of Systems Review of Systems  Constitutional: Positive for fever.  Respiratory: Positive for cough and shortness of breath.   Gastrointestinal: Positive for diarrhea.  All other systems reviewed and are negative.    Physical Exam Updated Vital Signs BP (!) 139/96   Pulse 97   Temp 98.3 F (36.8 C) (Oral)   Resp (!) 28   Ht '5\' 6"'$  (1.676 m)   Wt 99.8 kg   SpO2 98%   BMI 35.51 kg/m   Physical Exam Vitals signs and nursing note  reviewed.  Constitutional:      Appearance: He is well-developed.  HENT:     Head: Normocephalic and atraumatic.  Cardiovascular:     Rate and Rhythm: Regular rhythm.     Heart sounds: No murmur.     Comments: Tachycardic Pulmonary:     Effort: Pulmonary effort is normal. No respiratory distress.     Breath sounds: Normal breath sounds.     Comments: Tachypnea Abdominal:     Palpations: Abdomen is soft.     Tenderness: There is no abdominal tenderness. There is no guarding or rebound.  Musculoskeletal:        General: No swelling or tenderness.  Skin:    General: Skin is warm and dry.     Capillary Refill:  Capillary refill takes less than 2 seconds.  Neurological:     Mental Status: He is alert and oriented to person, place, and time.  Psychiatric:        Behavior: Behavior normal.      ED Treatments / Results  Labs (all labs ordered are listed, but only abnormal results are displayed) Labs Reviewed  COMPREHENSIVE METABOLIC PANEL - Abnormal; Notable for the following components:      Result Value   Potassium 3.2 (*)    Chloride 94 (*)    Glucose, Bld 259 (*)    Calcium 8.5 (*)    All other components within normal limits  RESPIRATORY PANEL BY PCR  CULTURE, BLOOD (ROUTINE X 2)  CULTURE, BLOOD (ROUTINE X 2)  EXPECTORATED SPUTUM ASSESSMENT W REFEX TO RESP CULTURE  GRAM STAIN  CBC WITH DIFFERENTIAL/PLATELET  TROPONIN I  D-DIMER, QUANTITATIVE (NOT AT [facility])  URINALYSIS, ROUTINE W REFLEX MICROSCOPIC  MAGNESIUM  LACTATE DEHYDROGENASE  C-REACTIVE PROTEIN  INFLUENZA PANEL BY PCR (TYPE A & B)  LACTIC ACID, PLASMA  LACTIC ACID, PLASMA  PROCALCITONIN  HEMOGLOBIN A1C  LIPID PANEL  TROPONIN I  TROPONIN I  TROPONIN I  STREP PNEUMONIAE URINARY ANTIGEN  LEGIONELLA PNEUMOPHILA SEROGP 1 UR AG    EKG EKG Interpretation  Date/Time:  Monday January 12 2019 18:27:13 EDT Ventricular Rate:  104 PR Interval:    QRS Duration: 77 QT Interval:  383 QTC Calculation: 504 R  Axis:   45 Text Interpretation:  Sinus tachycardia Probable left atrial enlargement Nonspecific T abnormalities, lateral leads Prolonged QT interval Confirmed by Quintella Reichert [phone]) on 01/12/2019 6:32:11 PM   Radiology Dg Chest Port 1 View  Result Date: 01/12/2019 CLINICAL DATA:  Cough, shortness of breath EXAM: PORTABLE CHEST 1 VIEW COMPARISON:  12/22/2017 FINDINGS: There is hazy left perihilar airspace disease. There is no pleural effusion or pneumothorax. The heart and mediastinal contours are unremarkable. The osseous structures are unremarkable. IMPRESSION: Hazy left perihilar airspace disease concerning for pneumonia. Electronically Signed   By: Kathreen Devoid   On: 01/12/2019 19:34    Procedures Procedures (including critical care time)  Medications Ordered in ED Medications  potassium chloride SA (K-DUR,KLOR-CON) CR tablet 40 mEq (has no administration in time range)  insulin aspart (novoLOG) injection 0-9 Units (has no administration in time range)  emtricitabine-rilpivir-tenofovir AF (ODEFSEY) 200-25-25 MG per tablet 1 tablet (has no administration in time range)  pravastatin (PRAVACHOL) tablet 40 mg (has no administration in time range)  Insulin Glargine (LANTUS) Solostar Pen 15 Units (has no administration in time range)  loratadine (CLARITIN) tablet 10 mg (has no administration in time range)  levalbuterol (XOPENEX HFA) inhaler 2 puff (has no administration in time range)  ipratropium (ATROVENT HFA) inhaler 2 puff (has no administration in time range)  dextromethorphan-guaiFENesin (MUCINEX DM) 30-600 MG per 12 hr tablet 1 tablet (has no administration in time range)  enoxaparin (LOVENOX) injection 40 mg (has no administration in time range)  cefTRIAXone (ROCEPHIN) 1 g in sodium chloride 0.9 % 100 mL IVPB (has no administration in time range)  azithromycin (ZITHROMAX) 500 mg in sodium chloride 0.9 % 250 mL IVPB (has no administration in time range)  hydrALAZINE (APRESOLINE)  injection 5 mg (has no administration in time range)  hydrOXYzine (ATARAX/VISTARIL) tablet 10 mg (has no administration in time range)  acetaminophen (TYLENOL) tablet 650 mg (has no administration in time range)  sodium chloride 0.9 % bolus 500 mL (0 mLs Intravenous Stopped 01/12/19 2010)  benzonatate (  TESSALON) capsule 100 mg (100 mg Oral Given 01/12/19 1913)  cefTRIAXone (ROCEPHIN) 1 g in sodium chloride 0.9 % 100 mL IVPB (0 g Intravenous Stopped 01/12/19 2130)  azithromycin (ZITHROMAX) tablet 500 mg (500 mg Oral Given 01/12/19 2107)     Initial Impression / Assessment and Plan / ED Course  I have reviewed the triage vital signs and the nursing notes.  Pertinent labs & imaging results that were available during my care of the patient were reviewed by me and considered in my medical decision making (see chart for details).       Patient here for evaluation of fever, cough, shortness of breath. He is tachycardic and tachypnea on examination. Chest x-ray with pneumonia. Will treat with antibiotics and IV fluids. PE unlikely as D dimer is negative. Given ongoing tachypnea and tachycardia medicine consulted for admission. Patient updated of findings of studies recommendation for admission and he is in agreement with treatment plan.  Wayne Wolfe was evaluated in Emergency Department on 01/12/2019 for the symptoms described in the history of present illness. He was evaluated in the context of the global COVID-19 pandemic, which necessitated consideration that the patient might be at risk for infection with the SARS-CoV-2 virus that causes COVID-19. Institutional protocols and algorithms that pertain to the evaluation of patients at risk for COVID-19 are in a state of rapid change based on information released by regulatory bodies including the CDC and federal and state organizations. These policies and algorithms were followed during the patient's care in the ED.   Final Clinical Impressions(s) / ED  Diagnoses   Final diagnoses:  None    ED Discharge Orders    None       Quintella Reichert, MD 01/12/19 2330

## 2019-01-12 NOTE — Telephone Encounter (Signed)
NURSING TRIAGE NOTE FOR RESPIRATORY SYMPTOMS  Do you have a fever? 30 mins ago had orange juice, checked his temp 100.2 orally  Do you have a cough?cough, gotten worse, sm production, clear to green yellow  Do you have shortness of breath more than normal? Sat short of breath continues  Do you have chest pain?L side chest pain   Are you able to eat and drink normally? Appetite decreased, drinking pretty good  Have you seen a physician for these symptoms? No, spoke to dr helburg  Action pt wants to get the test done He was ask to stay inside away from others, rest, drink liquids, keep hands and surfaces clean Await dr's call  I informed patient to expect a phone call from a physician soon and I sent request to front desk to schedule a virtual office appt for patient and arrive the patient.

## 2019-01-12 NOTE — ED Triage Notes (Signed)
Patient complains of cough, fever (100.2), shortness of breath, diarrhea for the last few days. Patient also complains of 5/10 chest pain, alteration of taste and smell. Per EMS the patient was 93% O2 sat on room air but felt better with 3L of O2. They also report him being tachycardic upon their arrival.  EMS vitals:  144/104 BP 106 HR 97% O2 sats on 4L O2 18 Resp Rate 272 CBG

## 2019-01-13 ENCOUNTER — Other Ambulatory Visit: Payer: Self-pay | Admitting: Oncology

## 2019-01-13 ENCOUNTER — Other Ambulatory Visit: Payer: Self-pay | Admitting: *Deleted

## 2019-01-13 DIAGNOSIS — E1165 Type 2 diabetes mellitus with hyperglycemia: Secondary | ICD-10-CM

## 2019-01-13 DIAGNOSIS — A419 Sepsis, unspecified organism: Secondary | ICD-10-CM | POA: Diagnosis present

## 2019-01-13 DIAGNOSIS — J181 Lobar pneumonia, unspecified organism: Secondary | ICD-10-CM | POA: Diagnosis not present

## 2019-01-13 LAB — RESPIRATORY PANEL BY PCR

## 2019-01-13 LAB — URINALYSIS, ROUTINE W REFLEX MICROSCOPIC
Bilirubin Urine: NEGATIVE
Glucose, UA: >=500 mg/dL — AB
Hgb urine dipstick: NEGATIVE
Ketones, ur: 5 mg/dL — AB
Leukocytes,Ua: NEGATIVE
Nitrite: NEGATIVE
Protein, ur: 100 mg/dL — AB
Specific Gravity, Urine: 1.035 — ABNORMAL HIGH (ref 1.005–1.030)
pH: 6 (ref 5.0–8.0)

## 2019-01-13 LAB — GLUCOSE, CAPILLARY
Glucose-Capillary: 176 mg/dL — ABNORMAL HIGH (ref 70–99)
Glucose-Capillary: 183 mg/dL — ABNORMAL HIGH (ref 70–99)
Glucose-Capillary: 233 mg/dL — ABNORMAL HIGH (ref 70–99)

## 2019-01-13 LAB — LACTIC ACID, PLASMA
Lactic Acid, Venous: 1.2 mmol/L (ref 0.5–1.9)
Lactic Acid, Venous: 1.6 mmol/L (ref 0.5–1.9)

## 2019-01-13 LAB — STREP PNEUMONIAE URINARY ANTIGEN: Strep Pneumo Urinary Antigen: NEGATIVE

## 2019-01-13 LAB — PROCALCITONIN: Procalcitonin: <0.1 ng/mL

## 2019-01-13 LAB — LACTATE DEHYDROGENASE: LDH: 292 U/L — ABNORMAL HIGH (ref 98–192)

## 2019-01-13 LAB — INFLUENZA PANEL BY PCR (TYPE A & B)
Influenza A By PCR: NEGATIVE
Influenza B By PCR: NEGATIVE

## 2019-01-13 LAB — LIPID PANEL
Cholesterol: 91 mg/dL (ref 0–200)
HDL: 21 mg/dL — ABNORMAL LOW (ref >40)
LDL Cholesterol: 52 mg/dL (ref 0–99)
Total CHOL/HDL Ratio: 4.3 RATIO
Triglycerides: 91 mg/dL (ref <150)
VLDL: 18 mg/dL (ref 0–40)

## 2019-01-13 LAB — D-DIMER, QUANTITATIVE: D-Dimer, Quant: 0.49 ug/mL-FEU (ref 0.00–0.50)

## 2019-01-13 LAB — FERRITIN: Ferritin: 424 ng/mL — ABNORMAL HIGH (ref 24–336)

## 2019-01-13 LAB — C-REACTIVE PROTEIN
CRP: 11.9 mg/dL — ABNORMAL HIGH (ref <1.0)
CRP: 10.5 mg/dL — ABNORMAL HIGH (ref <1.0)

## 2019-01-13 LAB — SARS CORONAVIRUS 2 BY RT PCR (HOSPITAL ORDER, PERFORMED IN ~~LOC~~ HOSPITAL LAB): SARS Coronavirus 2: POSITIVE — AB

## 2019-01-13 LAB — TROPONIN I
Troponin I: <0.03 ng/mL (ref <0.03)
Troponin I: <0.03 ng/mL (ref <0.03)
Troponin I: <0.03 ng/mL (ref <0.03)

## 2019-01-13 LAB — TRIGLYCERIDES: Triglycerides: 96 mg/dL (ref <150)

## 2019-01-13 LAB — MAGNESIUM: Magnesium: 1.9 mg/dL (ref 1.7–2.4)

## 2019-01-13 LAB — FIBRINOGEN: Fibrinogen: 686 mg/dL — ABNORMAL HIGH (ref 210–475)

## 2019-01-13 MED ORDER — METFORMIN HCL ER 500 MG PO TB24: 1000.0000 mg | ORAL_TABLET | Freq: Two times a day (BID) | ORAL | 1 refills | Status: DC

## 2019-01-13 MED ORDER — POTASSIUM CHLORIDE CRYS ER 20 MEQ PO TBCR
40.0000 meq | EXTENDED_RELEASE_TABLET | ORAL | Status: AC
  Administered 2019-01-13 (×2): 40 meq via ORAL
  Filled 2019-01-13 (×2): qty 2

## 2019-01-13 MED ORDER — MAGNESIUM SULFATE 2 GM/50ML IV SOLN
2.0000 g | Freq: Once | INTRAVENOUS | Status: AC
  Administered 2019-01-13: 2 g via INTRAVENOUS
  Filled 2019-01-13: qty 50

## 2019-01-13 MED ORDER — SODIUM CHLORIDE 0.9 % IV SOLN
100.0000 mg | Freq: Two times a day (BID) | INTRAVENOUS | Status: DC
  Administered 2019-01-13: 100 mg via INTRAVENOUS
  Filled 2019-01-13: qty 100

## 2019-01-13 MED ORDER — SODIUM CHLORIDE 0.9 % IV BOLUS
500.0000 mL | Freq: Once | INTRAVENOUS | Status: AC
  Administered 2019-01-13: 500 mL via INTRAVENOUS

## 2019-01-13 MED ORDER — LEVOFLOXACIN 750 MG PO TABS: 750.0000 mg | ORAL_TABLET | ORAL | Status: DC

## 2019-01-13 MED ORDER — LEVOFLOXACIN 750 MG PO TABS: 750.0000 mg | ORAL_TABLET | ORAL | 0 refills | Status: DC

## 2019-01-13 MED ORDER — LIRAGLUTIDE 18 MG/3ML ~~LOC~~ SOPN: PEN_INJECTOR | SUBCUTANEOUS | 1 refills | Status: DC

## 2019-01-13 NOTE — Telephone Encounter (Signed)
Received fax from pt's pharmacy stating he my benefit from 90-day on metformin and victoza.  Pt is currently in hospital, but will send refill request to pcp for review.  Please advise.Despina Hidden Cassady4/14/20202:27 PM

## 2019-01-13 NOTE — H&P (Addendum)
History and Physical    Wayne Wolfe WEX:[id number] DOB: [birth year] DOA: 01/12/2019  Referring MD/NP/PA:   PCP: Lars Mage, MD   Patient coming from:  The patient is coming from home.  At baseline, pt is independent for most of ADL.        Chief Complaint: Fever, chills, cough, shortness of breath, chest pain  HPI: Wayne Wolfe is a 53 y.o. male with medical history significant of hypertension, hyperlipidemia, diabetes mellitus, HIV, who presents with fever, chills, cough, shortness of breath, chest pain.  Patient states that his symptoms have been going on for more than 3 days, including fever, chills, cough, shortness of breath and chest pain.  He has cough with greenish colored mucus sputum production.  He had fever of 100.2 at home.  He states that his chest pain is located in the left side of chest, constant, sharp, 5 out of 10 in severity, nonradiating, pleuritic, aggravated by coughing and deep breaths.  No tenderness in calf area. He has nausea, but no vomiting.  He states that he has been having loose stool bowel movement, but no abdominal pain.  He reports alteration of taste and smell.  Patient states that he possibly had a sick contact with his cousin's boy friend who possibly has COVID-19 last week.  Patient denies symptoms of UTI or unilateral weakness.  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   ED Course: pt was found to have WBC 6.2, negative troponin, potassium 3.2, creatinine 1.11, GFr>60, temperature 98.3, tachycardia, tachypnea, oxygen saturation 90-93% on room air, chest x-ray showed left perihilar infiltration.  Patient is placed on telemetry bed for observation.  Review of Systems:   General: Has fevers, chills, no body weight gain, has fatigue HEENT: no blurry vision, hearing changes or sore throat Respiratory: Has dyspnea, coughing, no wheezing CV: no chest pain, no  palpitations GI: no nausea, vomiting, abdominal pain, diarrhea, constipation GU: no dysuria, burning on urination, increased urinary frequency, hematuria  Ext: no leg edema Neuro: no unilateral weakness, numbness, or tingling, no vision change or hearing loss Skin: no rash, no skin tear. MSK: No muscle spasm, no deformity, no limitation of range of movement in spin Heme: No easy bruising.  Travel history: No recent long distant travel.  Allergy: No Known Allergies  Past Medical History:  Diagnosis Date  . Dizziness 03/13/2017  . Flu-like symptoms 12/02/2018  . HIV (human immunodeficiency virus infection) ([place]) 2009   11/20/2012 Last CD4 count 210 and VL <20  . 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 Hypertension 2009   Well controlled on HCTZ  . Otitis media 03/13/2017  . Polyuria 03/13/2017  . Poorly controlled diabetes mellitus ([place]) 03/13/2017  . Type 2 diabetes mellitus (HCC)    Well controlled on metformin  . Weight loss 03/13/2017    Past Surgical History:  Procedure Laterality Date  . CARPAL TUNNEL RELEASE Right 1990s  . none      Social History:  reports that he has quit smoking. He has never used smokeless tobacco. He reports that he does not drink alcohol or use drugs.  Family History:  Family History  Problem Relation Age of Onset  . Diabetes Mellitus II Sister   . Diabetes Mellitus II Mother   . Coronary artery disease Mother 62       CABGx6  . Coronary artery disease Sister 33  CABG  . Diabetes Mellitus II Sister   . Coronary artery disease Sister   . Diabetes Mellitus II Sister      Prior to Admission medications   Medication Sig Start Date End Date Taking? Authorizing Provider  acetaminophen (TYLENOL) 500 MG tablet Take 1,000 mg by mouth every 6 (six) hours as needed for fever.   Yes [provider]  emtricitabine-rilpivir-tenofovir AF (ODEFSEY) 200-25-25 MG TABS tablet Take 1 tablet by mouth daily with breakfast. 01/12/19   Yes Philip Callas, NP  fexofenadine (ALLEGRA) 180 MG tablet Take 1 tablet (180 mg total) by mouth daily. 11/19/18  Yes [place] Callas, NP  hydrochlorothiazide (HYDRODIURIL) 25 MG tablet Take 1 tablet (25 mg total) by mouth daily. 11/19/18  Yes [place] Callas, NP  Insulin Glargine (LANTUS SOLOSTAR) 100 UNIT/ML Solostar Pen Inject 20 Units into the skin daily at 10 pm. 09/19/18  Yes Chundi, Vahini, MD  liraglutide (VICTOZA) 18 MG/3ML SOPN ADMINISTER 1.2 MG UNDER THE SKIN DAILY Patient taking differently: Inject 1.2 mg into the skin daily.  01/12/19  Yes Annia Belt, MD  metFORMIN (GLUCOPHAGE-XR) 500 MG 24 hr tablet Take 2 tablets (1,000 mg total) by mouth 2 (two) times daily. 09/22/18 01/12/19 Yes Chundi, Vahini, MD  pravastatin (PRAVACHOL) 40 MG tablet TAKE 1 TABLET BY MOUTH DAILY 01/12/19  Yes Tommy Medal, Lavell Islam, MD  ACCU-CHEK FASTCLIX LANCETS MISC Check blood sugar before meals and after meals 10/24/17   Chundi, Verne Spurr, MD  Blood Glucose Monitoring Suppl (ACCU-CHEK GUIDE) w/Device KIT 1 each by Does not apply route 6 (six) times daily. 10/24/17   Chundi, Verne Spurr, MD  glucose blood (ACCU-CHEK GUIDE) test strip Check blood sugar before meals and after meals 10/24/17   Chundi, Vahini, MD  Insulin Pen Needle (B-D UF III MINI PEN NEEDLES) 31G X 5 MM MISC Use 2 times daily to inject insulin into the skin. diag code E11.65. insulin dependent 11/18/18   Chundi, Verne Spurr, MD  lisinopril (PRINIVIL,ZESTRIL) 10 MG tablet Take 1 tablet (10 mg total) by mouth daily. Patient not taking: Reported on 01/12/2019 07/04/18 10/02/18  Lars Mage, MD    Physical Exam: Vitals:   01/12/19 1900 01/12/19 1930 01/13/19 0112 01/13/19 0117  BP: (!) 139/96   (!) 127/92  Pulse: (!) 101 97  (!) 105  Resp: (!) 28   20  Temp:    99.5 F (37.5 C)  TempSrc:    Oral  SpO2: 97% 98%  93%  Weight:   107.4 kg   Height:   5' 6"  (1.676 m)    General: Not in acute distress HEENT:       Eyes: PERRL, EOMI, no scleral  icterus.       ENT: No discharge from the ears and nose, no pharynx injection, no tonsillar enlargement.        Neck: No JVD, no bruit, no mass felt. Heme: No neck lymph node enlargement. Cardiac: S1/S2, RRR, No murmurs, No gallops or rubs. Respiratory: Has coarse breathing sound bilaterally.  No rales, wheezing, rhonchi or rubs. GI: Soft, nondistended, nontender, no rebound pain, no organomegaly, BS present. GU: No hematuria Ext: No pitting leg edema bilaterally. 2+DP/PT pulse bilaterally. Musculoskeletal: No joint deformities, No joint redness or warmth, no limitation of ROM in spin. Skin: No rashes.  Neuro: Alert, oriented X3, cranial nerves II-XII grossly intact, moves all extremities normally.  Psych: Patient is not psychotic, no suicidal or hemocidal ideation.  Labs on Admission: I have personally reviewed following  labs and imaging studies  CBC: Recent Labs  Lab 01/12/19 1925  WBC 6.2  NEUTROABS 4.1  HGB 16.5  HCT 50.1  MCV 88.0  PLT 709   Basic Metabolic Panel: Recent Labs  Lab 01/12/19 1925  NA 136  K 3.2*  CL 94*  CO2 32  GLUCOSE 259*  BUN 12  CREATININE 1.11  CALCIUM 8.5*   GFR: Estimated Creatinine Clearance: 87.4 mL/min (by C-G formula based on SCr of 1.11 mg/dL). Liver Function Tests: Recent Labs  Lab 01/12/19 1925  AST 20  ALT 23  ALKPHOS 48  BILITOT 0.5  PROT 7.7  ALBUMIN 3.5   No results for input(s): LIPASE, AMYLASE in the last 168 hours. No results for input(s): AMMONIA in the last 168 hours. Coagulation Profile: No results for input(s): INR, PROTIME in the last 168 hours. Cardiac Enzymes: Recent Labs  Lab 01/12/19 1925  TROPONINI <0.03   BNP (last 3 results) No results for input(s): PROBNP in the last 8760 hours. HbA1C: No results for input(s): HGBA1C in the last 72 hours. CBG: No results for input(s): GLUCAP in the last 168 hours. Lipid Profile: No results for input(s): CHOL, HDL, LDLCALC, TRIG, CHOLHDL, LDLDIRECT in the last  72 hours. Thyroid Function Tests: No results for input(s): TSH, T4TOTAL, FREET4, T3FREE, THYROIDAB in the last 72 hours. Anemia Panel: No results for input(s): VITAMINB12, FOLATE, FERRITIN, TIBC, IRON, RETICCTPCT in the last 72 hours. Urine analysis:    Component Value Date/Time   COLORURINE DARK YELLOW 03/13/2017 1017   APPEARANCEUR CLEAR 03/13/2017 1017   APPEARANCEUR Clear 01/20/2016 1500   LABSPEC 1.031 03/13/2017 1017   PHURINE 5.5 03/13/2017 1017   GLUCOSEU 3+ (A) 03/13/2017 1017   GLUCOSEU NEG mg/dL 03/17/2010 1853   HGBUR NEGATIVE 03/13/2017 1017   HGBUR negative 04/20/2009 0850   BILIRUBINUR NEGATIVE 03/13/2017 1017   BILIRUBINUR Negative 01/20/2016 1500   KETONESUR NEGATIVE 03/13/2017 1017   PROTEINUR TRACE (A) 03/13/2017 1017   UROBILINOGEN 1.0 08/11/2015 1439   UROBILINOGEN 0.2 03/17/2010 1853   NITRITE NEGATIVE 03/13/2017 1017   LEUKOCYTESUR NEGATIVE 03/13/2017 1017   LEUKOCYTESUR Negative 01/20/2016 1500   Sepsis Labs: '@LABRCNTIP'$ (procalcitonin:4,lacticidven:4) )No results found for this or any previous visit (from the past 240 hour(s)).   Radiological Exams on Admission: Dg Chest Port 1 View  Result Date: 01/12/2019 CLINICAL DATA:  Cough, shortness of breath EXAM: PORTABLE CHEST 1 VIEW COMPARISON:  12/22/2017 FINDINGS: There is hazy left perihilar airspace disease. There is no pleural effusion or pneumothorax. The heart and mediastinal contours are unremarkable. The osseous structures are unremarkable. IMPRESSION: Hazy left perihilar airspace disease concerning for pneumonia. Electronically Signed   By: Kathreen Devoid   On: 01/12/2019 19:34     EKG: Independently reviewed.  Sinus rhythm, tachycardia, QTC 504, nonspecific T wave change.  Assessment/Plan Principal Problem:   Lobar pneumonia (HCC) Active Problems:   Human immunodeficiency virus (HIV) disease ([place])   Hypertension associated with diabetes ([place])   Diabetes type 2, uncontrolled ([place])    Hyperlipidemia associated with type 2 diabetes mellitus ([place])   Hypokalemia   Suspected Covid-19 Virus Infection   Acute respiratory failure with hypoxia (HCC)   Sepsis ([place])  Sepsis and acute respiratory failure with hypoxia due to lobar pneumonia [facility]): Patient has a fever, chills, cough, shortness of breath and pleuritic chest pain, breast x-ray findings of left perihilar infiltration, clinically consistent with lobar pneumonia.  Patient also has risk of COVD19 (see below for A&P). He meets criteria for sepsis  with tachycardia and tachypnea and fever.  Currently hemodynamically stable.  - Will place on telemetry bed for obs. - IV Rocephin and doxycycline-->pt received 1 dose of azithromycin, will change to doxycycline due to prolonged QTc interval. - Mucinex for cough  - Atrovent inhaler and Xopenex inhaler prn for SOB - Urine legionella and S. pneumococcal antigen - Follow up blood culture x2, sputum culture and respiratory virus panel, plus Flu pcr - will get Procalcitonin and trend lactic acid level per sepsis protocol - IVF: NS 500 ml x 2. Due to suspected COVID19-->will not give aggressive IV fluid treatment  Human immunodeficiency virus (HIV) disease ([place]):  CD4=463 on 04/16/18 and VL<20 on 11/03/18 -continue home HIV meds  Hypertension associated with diabetes [facility]): Patient is taking HCTZ, but not taking lisinopril at home.  Blood pressure 139/96. -hold HCTZ due to sepsis IV hydralazine as needed  Diabetes type 2, uncontrolled ([place]): Last A1c 10.5 on 09/19/18, poorly controled. Patient is taking Lantus, Victoza and metformin at home -will decrease Lantus dose from 20 to 15 units daily -SSI  Hyperlipidemia associated with type 2 diabetes mellitus ([place]): -Pravastatin  Hypokalemia: K= 3.2 on admission. - Repleted - Check Mg level  Suspected Covid-19 Virus Infection: Patient has atypical symptoms including cough, fever, chills, shortness of breath, diarrhea, alteration of taste  and smell.  Possible sick contact with COVID-19 personnel.  She had oxygen desaturation to 90% on room air, which improved to 93 or above on 3 L oxygen. Pt has QT prolongation, not a good candidate for Plaquenil.  Patient is immunosuppressed due to HIV, cannot use Tocilizumab. D-dimer 0.41, CRP 11.9.  Physician PPE: I used Capr, gown, glove Patient PPE: mask  Fever: yes Cough: yes  SOB: yes URI symptoms: yes GI symptoms: yes Travel: no Sick contacts: possible CBC: leukopenia, lymphopenia-->no BMP: increased BUN/Cr=12/1.11 LFTs: increased AST/ALT/Tbili-->No CRP: 11.9. elevaeted. LDH: pending Procalcitonin: pending CXR: hazy bilateral peripheral opacities--> has perihilar infiltration CT chest: GGO, consolidation, crazy paving-->not done yet COVID subjective risk assessment: low risk COVID Testing: indicated per current ID/[place] guidelines-->yes Precaution: Droplet and contact   Addendum: COVID19 test came back positive. -will check daily D-dimer and CRP -will check ferritin level, fibrinogen level, hepatitis B surface antigen, triglyceride level -Patient is not a candidate for Tocilizumab due to HIV, therefore will not check IL 6 level   DVT ppx:  SQ Lovenox 40 mg daily (BMI<40, D-dimer<5) Code Status: Full code Family Communication: None at bed side. Disposition Plan:  Anticipate discharge back to previous home environment Consults called:  NONE Admission status: Obs / tele   Date of Service 01/13/2019    [place] [facility]   If 7PM-7AM, please contact night-coverage [email or web address] Password [facility] 01/13/2019, 1:38 AM

## 2019-01-13 NOTE — Progress Notes (Signed)
CRITICAL VALUE ALERT  Critical Value:  COVID +  Date & Time Notied:  01/13/2019 0408  Provider Notified: Dr. Blaine Hamper  Orders Received/Actions taken: already on isolation

## 2019-01-13 NOTE — Discharge Instructions (Signed)
Person Under Monitoring Name: Wayne Wolfe  Location: [place] [address]   Infection Prevention Recommendations for Individuals Confirmed to have, or Being Evaluated for, 2019 Novel Coronavirus (COVID-19) Infection Who Receive Care at Home  Individuals who are confirmed to have, or are being evaluated for, COVID-19 should follow the prevention steps below until a healthcare provider or local or state health department says they can return to normal activities.  Stay home except to get medical care You should restrict activities outside your home, except for getting medical care. Do not go to work, school, or public areas, and do not use public transportation or taxis.  Call ahead before visiting your doctor Before your medical appointment, call the healthcare provider and tell them that you have, or are being evaluated for, COVID-19 infection. This will help the healthcare providers office take steps to keep other people from getting infected. Ask your healthcare provider to call the local or state health department.  Monitor your symptoms Seek prompt medical attention if your illness is worsening (e.g., difficulty breathing). Before going to your medical appointment, call the healthcare provider and tell them that you have, or are being evaluated for, COVID-19 infection. Ask your healthcare provider to call the local or state health department.  Wear a facemask You should wear a facemask that covers your nose and mouth when you are in the same room with other people and when you visit a healthcare provider. People who live with or visit you should also wear a facemask while they are in the same room with you.  Separate yourself from other people in your home As much as possible, you should stay in a different room from other people in your home. Also, you should use a separate bathroom, if available.  Avoid sharing household items You should  not share dishes, drinking glasses, cups, eating utensils, towels, bedding, or other items with other people in your home. After using these items, you should wash them thoroughly with soap and water.  Cover your coughs and sneezes Cover your mouth and nose with a tissue when you cough or sneeze, or you can cough or sneeze into your sleeve. Throw used tissues in a lined trash can, and immediately wash your hands with soap and water for at least 20 seconds or use an alcohol-based hand rub.  Wash your [facility] your hands often and thoroughly with soap and water for at least 20 seconds. You can use an alcohol-based hand sanitizer if soap and water are not available and if your hands are not visibly dirty. Avoid touching your eyes, nose, and mouth with unwashed hands.   Prevention Steps for Caregivers and Household Members of Individuals Confirmed to have, or Being Evaluated for, COVID-19 Infection Being Cared for in the Home  If you live with, or provide care at home for, a person confirmed to have, or being evaluated for, COVID-19 infection please follow these guidelines to prevent infection:  Follow healthcare providers instructions Make sure that you understand and can help the patient follow any healthcare provider instructions for all care.  Provide for the patients basic needs You should help the patient with basic needs in the home and provide support for getting groceries, prescriptions, and other personal needs.  Monitor the patients symptoms If they are getting sicker, call his or her medical provider and tell them that the patient has, or is being evaluated for, COVID-19 infection. This will help the healthcare providers office  take steps to keep other people from getting infected. Ask the healthcare provider to call the local or state health department.  Limit the number of people who have contact with the patient  If possible, have only one caregiver for the  patient.  Other household members should stay in another home or place of residence. If this is not possible, they should stay  in another room, or be separated from the patient as much as possible. Use a separate bathroom, if available.  Restrict visitors who do not have an essential need to be in the home.  Keep older adults, very young children, and other sick people away from the patient Keep older adults, very young children, and those who have compromised immune systems or chronic health conditions away from the patient. This includes people with chronic heart, lung, or kidney conditions, diabetes, and cancer.  Ensure good ventilation Make sure that shared spaces in the home have good air flow, such as from an air conditioner or an opened window, weather permitting.  Wash your hands often  Wash your hands often and thoroughly with soap and water for at least 20 seconds. You can use an alcohol based hand sanitizer if soap and water are not available and if your hands are not visibly dirty.  Avoid touching your eyes, nose, and mouth with unwashed hands.  Use disposable paper towels to dry your hands. If not available, use dedicated cloth towels and replace them when they become wet.  Wear a facemask and gloves  Wear a disposable facemask at all times in the room and gloves when you touch or have contact with the patients blood, body fluids, and/or secretions or excretions, such as sweat, saliva, sputum, nasal mucus, vomit, urine, or feces.  Ensure the mask fits over your nose and mouth tightly, and do not touch it during use.  Throw out disposable facemasks and gloves after using them. Do not reuse.  Wash your hands immediately after removing your facemask and gloves.  If your personal clothing becomes contaminated, carefully remove clothing and launder. Wash your hands after handling contaminated clothing.  Place all used disposable facemasks, gloves, and other waste in a lined  container before disposing them with other household waste.  Remove gloves and wash your hands immediately after handling these items.  Do not share dishes, glasses, or other household items with the patient  Avoid sharing household items. You should not share dishes, drinking glasses, cups, eating utensils, towels, bedding, or other items with a patient who is confirmed to have, or being evaluated for, COVID-19 infection.  After the person uses these items, you should wash them thoroughly with soap and water.  Wash laundry thoroughly  Immediately remove and wash clothes or bedding that have blood, body fluids, and/or secretions or excretions, such as sweat, saliva, sputum, nasal mucus, vomit, urine, or feces, on them.  Wear gloves when handling laundry from the patient.  Read and follow directions on labels of laundry or clothing items and detergent. In general, wash and dry with the warmest temperatures recommended on the label.  Clean all areas the individual has used often  Clean all touchable surfaces, such as counters, tabletops, doorknobs, bathroom fixtures, toilets, phones, keyboards, tablets, and bedside tables, every day. Also, clean any surfaces that may have blood, body fluids, and/or secretions or excretions on them.  Wear gloves when cleaning surfaces the patient has come in contact with.  Use a diluted bleach solution (e.g., dilute bleach with 1 part  bleach and 10 parts water) or a household disinfectant with a label that says EPA-registered for coronaviruses. To make a bleach solution at home, add 1 tablespoon of bleach to 1 quart (4 cups) of water. For a larger supply, add  cup of bleach to 1 gallon (16 cups) of water.  Read labels of cleaning products and follow recommendations provided on product labels. Labels contain instructions for safe and effective use of the cleaning product including precautions you should take when applying the product, such as wearing gloves or  eye protection and making sure you have good ventilation during use of the product.  Remove gloves and wash hands immediately after cleaning.  Monitor yourself for signs and symptoms of illness Caregivers and household members are considered close contacts, should monitor their health, and will be asked to limit movement outside of the home to the extent possible. Follow the monitoring steps for close contacts listed on the symptom monitoring form.   ? If you have additional questions, contact your local health department or call the epidemiologist on call at [phone] (available 24/7). ? This guidance is subject to change. For the most up-to-date guidance from [facility], please refer to their website: [email or web address]  Levofloxacin tablets What is this medicine? LEVOFLOXACIN (lee voe FLOX a sin) is a quinolone antibiotic. It is used to treat certain kinds of bacterial infections. It will not work for colds, flu, or other viral infections. This medicine may be used for other purposes; ask your health care provider or pharmacist if you have questions. COMMON BRAND NAME(S): Levaquin, Levaquin Leva-Pak What should I tell my health care provider before I take this medicine? They need to know if you have any of these conditions: -bone problems -diabetes -heart disease -high blood pressure -history of irregular heartbeat -history of low levels of potassium in the blood -joint problems -kidney disease -liver disease -mental illness -myasthenia gravis -seizures -tendon problems -tingling of the fingers or toes, or other nerve disorder -an unusual or allergic reaction to levofloxacin, other quinolone antibiotics, foods, dyes, or preservatives -pregnant or trying to get pregnant -breast-feeding How should I use this medicine? Take this medicine by mouth with a full glass of water. Follow the directions on the prescription label. You  can take it with or without food. If it upsets your stomach, take it with food. Take your medicine at regular intervals. Do not take your medicine more often than directed. Take all of your medicine as directed even if you think you are better. Do not skip doses or stop your medicine early. Avoid antacids, calcium, iron, and zinc products for 2 hours before and 2 hours after taking a dose of this medicine. A special MedGuide will be given to you by the pharmacist with each prescription and refill. Be sure to read this information carefully each time. Talk to your pediatrician regarding the use of this medicine in children. While this drug may be prescribed for children as young as 6 months for selected conditions, precautions do apply. Overdosage: If you think you have taken too much of this medicine contact a poison control center or emergency room at once. NOTE: This medicine is only for you. Do not share this medicine with others. What if I miss a dose? If you miss a dose, take it as soon as you remember. If it is almost time for your next dose, take only that dose. Do not take double or extra doses. What may interact with this medicine? Do not take this  medicine with any of the following medications: -cisapride -dofetilide -dronedarone -pimozide -thioridazine -ziprasidone This medicine may also interact with the following medications: -antacids -birth control pills -certain medicines for diabetes, like glipizide, glyburide, or insulin -certain medicines that treat or prevent blood clots like warfarin -didanosine buffered tablets or powder -multivitamins -NSAIDS, medicines for pain and inflammation, like ibuprofen or naproxen -other medicines that prolong the QT interval (cause an abnormal heart rhythm) -steroid medicines like prednisone or cortisone -sucralfate -theophylline This list may not describe all possible interactions. Give your health care provider a list of all the  medicines, herbs, non-prescription drugs, or dietary supplements you use. Also tell them if you smoke, drink alcohol, or use illegal drugs. Some items may interact with your medicine. What should I watch for while using this medicine? Tell your doctor or healthcare professional if your symptoms do not start to get better or if they get worse. Do not treat diarrhea with over the counter products. Contact your doctor if you have diarrhea that lasts more than 2 days or if it is severe and watery. Check with your doctor or health care professional if you get an attack of severe diarrhea, nausea and vomiting, or if you sweat a lot. The loss of too much body fluid can make it dangerous for you to take this medicine. This medicine may affect blood sugar levels. If you have diabetes, check with your doctor or health care professional before you change your diet or the dose of your diabetic medicine. You may get drowsy or dizzy. Do not drive, use machinery, or do anything that needs mental alertness until you know how this medicine affects you. Do not sit or stand up quickly, especially if you are an older patient. This reduces the risk of dizzy or fainting spells. This medicine can make you more sensitive to the sun. Keep out of the sun. If you cannot avoid being in the sun, wear protective clothing and use a sunscreen. Do not use sun lamps or tanning beds/booths. What side effects may I notice from receiving this medicine? Side effects that you should report to your doctor or health care professional as soon as possible: -allergic reactions like skin rash or hives, swelling of the face, lips, or tongue -anxious -bloody or watery diarrhea -breathing problems -confusion -depressed mood -fast, irregular heartbeat -fever -hallucination, loss of contact with reality -joint, muscle, or tendon pain or swelling -loss of memory -muscle weakness -pain, tingling, numbness in the hands or feet -seizures -signs  and symptoms of aortic dissection such as sudden chest, stomach, or back pain -signs and symptoms of high blood sugar such as dizziness; dry mouth; dry skin; fruity breath; nausea; stomach pain; increased hunger or thirst; increased urination -signs and symptoms of liver injury like dark yellow or brown urine; general ill feeling or flu-like symptoms; light-colored stools; loss of appetite; nausea; right upper belly pain; unusually weak or tired; yellowing of the eyes or skin -signs and symptoms of low blood sugar such as feeling anxious; confusion; dizziness; increased hunger; unusually weak or tired; sweating; shakiness; cold; irritable; headache; blurred vision; fast heartbeat; loss of consciousness; pale skin -suicidal thoughts or other mood changes -sunburn -unusually weak or tired Side effects that usually do not require medical attention (report to your doctor or health care professional if they continue or are bothersome): -constipation -dry mouth -headache -nausea, vomiting -trouble sleeping This list may not describe all possible side effects. Call your doctor for medical advice about side effects. You  may report side effects to FDA at 1-800-FDA-1088. Where should I keep my medicine? Keep out of the reach of children. Store at room temperature between 15 and 30 degrees C (59 and 86 degrees F). Keep in a tightly closed container. Throw away any unused medicine after the expiration date. NOTE: This sheet is a summary. It may not cover all possible information. If you have questions about this medicine, talk to your doctor, pharmacist, or health care provider.  2019 [employer]/Gold Standard (2018-04-01 17:41:46)

## 2019-01-13 NOTE — ED Notes (Signed)
Consent for COVID 19 testing signed and in pt record.

## 2019-01-13 NOTE — Telephone Encounter (Signed)
Patient went to ED. Test positive for COVID-19. Hospitalized.

## 2019-01-13 NOTE — Discharge Summary (Addendum)
Wayne Wolfe, is a 53 y.o. male  DOB 1966-03-09  MRN [id number].  Admission date:  01/12/2019  Admitting Physician  Ivor Costa, MD  Discharge Date:  01/13/2019   Primary MD  Lars Mage, MD  Recommendations for primary care physician for things to follow:  -It was instructed to follow-up with [facility] department action prevention recommendations keeps of resolution, continue with solution as recommended, he did stresses understanding.  Admission Diagnosis  Lobar pneumonia ([place]) [J18.1]   Discharge Diagnosis  Lobar pneumonia ([place]) [J18.1]    Principal Problem:   Lobar pneumonia ([place]) Active Problems:   Human immunodeficiency virus (HIV) disease ([place])   Hypertension associated with diabetes ([place])   Diabetes type 2, uncontrolled ([place])   Hyperlipidemia associated with type 2 diabetes mellitus ([place])   Hypokalemia   Suspected Covid-19 Virus Infection   Acute respiratory failure with hypoxia ([place])   Sepsis ([place])      Past Medical History:  Diagnosis Date  . Dizziness 03/13/2017  . Flu-like symptoms 12/02/2018  . HIV (human immunodeficiency virus infection) ([place]) 2009   11/20/2012 Last CD4 count 210 and VL <20  . 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 Hypertension 2009   Well controlled on HCTZ  . Otitis media 03/13/2017  . Polyuria 03/13/2017  . Poorly controlled diabetes mellitus ([place]) 03/13/2017  . Type 2 diabetes mellitus (HCC)    Well controlled on metformin  . Weight loss 03/13/2017    Past Surgical History:  Procedure Laterality Date  . CARPAL TUNNEL RELEASE Right 1990s  . none         History of present illness and  Hospital Course:     Kindly see H&P for history of present illness and admission details, please review complete Labs, Consult reports and Test reports for all details in brief  HPI  from the history and physical done on the day of  admission 01/12/2019   HPI: Wayne Wolfe is a 53 y.o. male with medical history significant of hypertension, hyperlipidemia, diabetes mellitus, HIV, who presents with fever, chills, cough, shortness of breath, chest pain.  Patient states that his symptoms have been going on for more than 3 days, including fever, chills, cough, shortness of breath and chest pain.  He has cough with greenish colored mucus sputum production.  He had fever of 100.2 at home.  He states that his chest pain is located in the left side of chest, constant, sharp, 5 out of 10 in severity, nonradiating, pleuritic, aggravated by coughing and deep breaths.  No tenderness in calf area. He has nausea, but no vomiting.  He states that he has been having loose stool bowel movement, but no abdominal pain.  He reports alteration of taste and smell.  Patient states that he possibly had a sick contact with his cousin's boy friend who possibly has COVID-19 last week.  Patient denies symptoms of UTI or unilateral weakness.  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   ED Course: pt was found to have WBC 6.2, negative troponin, potassium 3.2, creatinine 1.11, GFr>60, temperature 98.3, tachycardia, tachypnea, oxygen saturation 90-93% on room air, chest x-ray showed left perihilar infiltration.  Patient is placed on telemetry bed for observation.  Hospital Course   Pneumonia due to COVID-19 infection -Patient reports fever and chills at home, he is afebrile here, no hypoxia, was saturating 9049 6% at room air, never required oxygen, appears comfortable, reports some congestion, otherwise denies any other significant complaints, discussed with ID regarding his HIV status on COVID-19, absence of hypoxia, there is no indication to keep hospitalized, so he will be discharged on another 4 days of levofloxacin over  possible bacterial pneumonia with his COVID-19  infection.  Human immunodeficiency virus (HIV) disease ([place]):  CD4=463 on 04/16/18 and VL<20 on 11/03/18 -continue home HIV meds  Hypertension associated with diabetes ([place]):  - continue home meds  Diabetes type 2, uncontrolled ([place]):  continue home meds  Hyperlipidemia associated with type 2 diabetes mellitus ([place]): -Pravastatin  Hypokalemia:  - repleted  Prolonged QTC of 504 resolved after repleting his potassium and magnesium, calculated QTC is 390 this morning  Patient was seen with Mickey Farber, CAPR, gloves and head cover   Discharge Condition:  stable   Follow UP  Follow-up Information    Chundi, Verne Spurr, MD. Schedule an appointment as soon as possible for a visit in 1 week(s).   Specialty:  Internal Medicine Contact information: [place] [address] [phone]        Tommy Medal, Lavell Islam, MD .   Specialty:  Infectious Diseases Contact information: [place] [place] [address] [phone]             Discharge Instructions  and  Discharge Medications     Discharge Instructions    Discharge instructions   Complete by:  As directed    Follow with Primary MD Lars Mage, MD   Get CBC, CMP, checked  by Primary MD next visit.    Activity: As tolerated with Full fall precautions use walker/cane & assistance as needed   Disposition Home    Diet: Heart Healthy, Carb modified , with feeding assistance and aspiration precautions.  For Heart failure patients - Check your Weight same time everyday, if you gain over 2 pounds, or you develop in leg swelling, experience more shortness of breath or chest pain, call your Primary MD immediately. Follow Cardiac Low Salt Diet and 1.5 lit/day fluid restriction.   On your next visit with your primary care physician please Get Medicines reviewed and adjusted.   Please request your Prim.MD to go over all Hospital Tests and Procedure/Radiological results at the follow up, please  get all Hospital records sent to your Prim MD by signing hospital release before you go home.   If you experience worsening of your admission symptoms, develop shortness of breath, life threatening emergency, suicidal or homicidal thoughts you must seek medical attention immediately by calling 911 or calling your MD immediately  if symptoms less severe.  You Must read complete instructions/literature along with all the possible adverse reactions/side effects for all the Medicines you take and that have been prescribed to you. Take any new Medicines after you have completely understood and accpet all the possible adverse reactions/side effects.   Do not drive, operating heavy machinery, perform activities at heights, swimming or participation in water activities or provide baby sitting services if your  were admitted for syncope or siezures until you have seen by Primary MD or a Neurologist and advised to do so again.  Do not drive when taking Pain medications.    Do not take more than prescribed Pain, Sleep and Anxiety Medications  Special Instructions: If you have smoked or chewed Tobacco  in the last 2 yrs please stop smoking, stop any regular Alcohol  and or any Recreational drug use.  Wear Seat belts while driving.   Please note  You were cared for by a hospitalist during your hospital stay. If you have any questions about your discharge medications or the care you received while you were in the hospital after you are discharged, you can call the unit and asked to speak with the hospitalist on call if the hospitalist that took care of you is not available. Once you are discharged, your primary care physician will handle any further medical issues. Please note that NO REFILLS for any discharge medications will be authorized once you are discharged, as it is imperative that you return to your primary care physician (or establish a relationship with a primary care physician if you do not have  one) for your aftercare needs so that they can reassess your need for medications and monitor your lab values.   Increase activity slowly   Complete by:  As directed      Allergies as of 01/13/2019   No Known Allergies     Medication List    TAKE these medications   Accu-Chek FastClix Lancets Misc Check blood sugar before meals and after meals   Accu-Chek Guide w/Device Kit 1 each by Does not apply route 6 (six) times daily.   acetaminophen 500 MG tablet Commonly known as:  TYLENOL Take 1,000 mg by mouth every 6 (six) hours as needed for fever.   emtricitabine-rilpivir-tenofovir AF 200-25-25 MG Tabs tablet Commonly known as:  Odefsey Take 1 tablet by mouth daily with breakfast.   fexofenadine 180 MG tablet Commonly known as:  ALLEGRA Take 1 tablet (180 mg total) by mouth daily.   glucose blood test strip Commonly known as:  Accu-Chek Guide Check blood sugar before meals and after meals   hydrochlorothiazide 25 MG tablet Commonly known as:  HYDRODIURIL Take 1 tablet (25 mg total) by mouth daily.   Insulin Glargine 100 UNIT/ML Solostar Pen Commonly known as:  Lantus SoloStar Inject 20 Units into the skin daily at 10 pm.   Insulin Pen Needle 31G X 5 MM Misc Commonly known as:  B-D UF III MINI PEN NEEDLES Use 2 times daily to inject insulin into the skin. diag code E11.65. insulin dependent   levofloxacin 750 MG tablet Commonly known as:  LEVAQUIN Take 1 tablet (750 mg total) by mouth daily. Start taking on:  January 14, 2019   liraglutide 18 MG/3ML Sopn Commonly known as:  Victoza ADMINISTER 1.2 MG UNDER THE SKIN DAILY What changed:    how much to take  how to take this  when to take this  additional instructions   lisinopril 10 MG tablet Commonly known as:  PRINIVIL,ZESTRIL Take 1 tablet (10 mg total) by mouth daily.   metFORMIN 500 MG 24 hr tablet Commonly known as:  GLUCOPHAGE-XR Take 2 tablets (1,000 mg total) by mouth 2 (two) times daily.    pravastatin 40 MG tablet Commonly known as:  PRAVACHOL TAKE 1 TABLET BY MOUTH DAILY         Diet and Activity recommendation: See Discharge Instructions above  Consults obtained -  none   Major procedures and Radiology Reports - PLEASE review detailed and final reports for all details, in brief -      Dg Chest Port 1 View  Result Date: 01/12/2019 CLINICAL DATA:  Cough, shortness of breath EXAM: PORTABLE CHEST 1 VIEW COMPARISON:  12/22/2017 FINDINGS: There is hazy left perihilar airspace disease. There is no pleural effusion or pneumothorax. The heart and mediastinal contours are unremarkable. The osseous structures are unremarkable. IMPRESSION: Hazy left perihilar airspace disease concerning for pneumonia. Electronically Signed   By: Kathreen Devoid   On: 01/12/2019 19:34    Micro Results    Recent Results (from the past 240 hour(s))  Respiratory Panel by PCR     Status: None   Collection Time: 01/12/19  9:40 PM  Result Value Ref Range Status   Adenovirus NOT DETECTED NOT DETECTED Final   Coronavirus 229E NOT DETECTED NOT DETECTED Final    Comment: (NOTE) The Coronavirus on the Respiratory Panel, DOES NOT test for the novel  Coronavirus (2019 nCoV)    Coronavirus HKU1 NOT DETECTED NOT DETECTED Final   Coronavirus NL63 NOT DETECTED NOT DETECTED Final   Coronavirus OC43 NOT DETECTED NOT DETECTED Final   Metapneumovirus NOT DETECTED NOT DETECTED Final   Rhinovirus / Enterovirus NOT DETECTED NOT DETECTED Final   Influenza A NOT DETECTED NOT DETECTED Final   Influenza B NOT DETECTED NOT DETECTED Final   Parainfluenza Virus 1 NOT DETECTED NOT DETECTED Final   Parainfluenza Virus 2 NOT DETECTED NOT DETECTED Final   Parainfluenza Virus 3 NOT DETECTED NOT DETECTED Final   Parainfluenza Virus 4 NOT DETECTED NOT DETECTED Final   Respiratory Syncytial Virus NOT DETECTED NOT DETECTED Final   Bordetella pertussis NOT DETECTED NOT DETECTED Final   Chlamydophila pneumoniae NOT  DETECTED NOT DETECTED Final   Mycoplasma pneumoniae NOT DETECTED NOT DETECTED Final    Comment: Performed at [facility], [place] [address], [place], [place] [address]  SARS Coronavirus 2 [facility] order, Performed in [place] hospital lab)     Status: Abnormal   Collection Time: 01/12/19 11:44 PM  Result Value Ref Range Status   SARS Coronavirus 2 POSITIVE (A) NEGATIVE Final    Comment: RESULT CALLED TO, READ BACK BY AND VERIFIED WITH: B. HOLT,RN [id number] 01/13/2019 T. TYSOR (NOTE) If result is NEGATIVE SARS-CoV-2 target nucleic acids are NOT DETECTED. The SARS-CoV-2 RNA is generally detectable in upper and lower  respiratory specimens during the acute phase of infection. The lowest  concentration of SARS-CoV-2 viral copies this assay can detect is 250  copies / mL. A negative result does not preclude SARS-CoV-2 infection  and should not be used as the sole basis for treatment or other  patient management decisions.  A negative result may occur with  improper specimen collection / handling, submission of specimen other  than nasopharyngeal swab, presence of viral mutation(s) within the  areas targeted by this assay, and inadequate number of viral copies  (<250 copies / mL). A negative result must be combined with clinical  observations, patient history, and epidemiological information. If result is POSITIVE SARS-CoV-2 target nucleic acids are DETECTED. T he SARS-CoV-2 RNA is generally detectable in upper and lower  respiratory specimens during the acute phase of infection.  Positive  results are indicative of active infection with SARS-CoV-2.  Clinical  correlation with patient history and other diagnostic information is  necessary to determine patient infection status.  Positive results do  not rule  out bacterial infection or co-infection with other viruses. If result is PRESUMPTIVE POSTIVE SARS-CoV-2 nucleic acids MAY BE PRESENT.   A presumptive positive result was obtained on  the submitted specimen  and confirmed on repeat testing.  While 2019 novel coronavirus  (SARS-CoV-2) nucleic acids may be present in the submitted sample  additional confirmatory testing may be necessary for epidemiological  and / or clinical management purposes  to differentiate between  SARS-CoV-2 and other Sarbecovirus currently known to infect humans.  If clinically indicated additional testing with an alternate test  methodology [phone]) is  advised. The SARS-CoV-2 RNA is generally  detectable in upper and lower respiratory specimens during the acute  phase of infection. The expected result is Negative. Fact Sheet for Patients:  StrictlyIdeas.no Fact Sheet for Healthcare Providers: BankingDealers.co.za This test is not yet approved or cleared by the [place] FDA and has been authorized for detection and/or diagnosis of SARS-CoV-2 by FDA under an Emergency Use Authorization (EUA).  This EUA will remain in effect (meaning this test can be used) for the duration of the COVID-19 declaration under Section 564(b)(1) of the Act, 21 U.S.C. section 360bbb-3(b)(1), unless the authorization is terminated or revoked sooner. Performed at [facility], [place] [address], [place], [place] [address]        Today   Subjective:   Nicklas Mcsweeney today reports some chest congestion, denies any chest pain, dyspnea, reports cough, minimally productive, saturating 94 to 96% on room air, no requirement of oxygen . Objective:   Blood pressure 127/82, pulse (!) 101, temperature 98.8 F (37.1 C), temperature source Oral, resp. rate 20, height 5' 6" (1.676 m), weight 107.4 kg, SpO2 92 %.   Intake/Output Summary (Last 24 hours) at 01/13/2019 1547 Last data filed at 01/13/2019 1330 Gross per 24 hour  Intake 752.57 ml  Output -  Net 752.57 ml    Exam Awake Alert, Oriented x 3, No new F.N deficits, Normal affect  Symmetrical Chest wall  movement, Good air movement bilaterally, CTAB RRR,No Gallops,Rubs or new Murmurs, No Parasternal Heave , Abd Soft, Non tender, No rebound -guarding or rigidity. No Cyanosis, Clubbing or edema, No new Rash or bruise  Data Review   CBC w Diff:  Lab Results  Component Value Date   WBC 6.2 01/12/2019   HGB 16.5 01/12/2019   HCT 50.1 01/12/2019   PLT 360 01/12/2019   LYMPHOPCT 28 01/12/2019   BANDSPCT 0 03/23/2008   MONOPCT 5 01/12/2019   EOSPCT 1 01/12/2019   BASOPCT 1 01/12/2019    CMP:  Lab Results  Component Value Date   NA 136 01/12/2019   K 3.2 (L) 01/12/2019   CL 94 (L) 01/12/2019   CO2 32 01/12/2019   BUN 12 01/12/2019   CREATININE 1.11 01/12/2019   CREATININE 1.12 11/03/2018   PROT 7.7 01/12/2019   ALBUMIN 3.5 01/12/2019   BILITOT 0.5 01/12/2019   ALKPHOS 48 01/12/2019   AST 20 01/12/2019   ALT 23 01/12/2019  .   Total Time in preparing paper work, data evaluation and todays exam - 10 minutes  Phillips Climes M.D on 01/13/2019 at 3:47 PM  [facility]   Office  [phone]

## 2019-01-14 LAB — HEMOGLOBIN A1C
Hgb A1c MFr Bld: 9.6 % — ABNORMAL HIGH (ref 4.8–5.6)
Mean Plasma Glucose: 229 mg/dL

## 2019-01-14 LAB — HEPATITIS B SURFACE ANTIGEN: Hepatitis B Surface Ag: NEGATIVE

## 2019-01-15 LAB — LEGIONELLA PNEUMOPHILA SEROGP 1 UR AG: L. pneumophila Serogp 1 Ur Ag: NEGATIVE

## 2019-01-16 ENCOUNTER — Telehealth: Payer: Self-pay | Admitting: Internal Medicine

## 2019-01-16 NOTE — Telephone Encounter (Signed)
Called to check up on Wayne Wolfe to see how he was doing. Left a message.   Lars Mage, MD Internal Medicine PGY2 KCMKL:[phone] ,

## 2019-01-18 LAB — CULTURE, BLOOD (ROUTINE X 2)
Culture: NO GROWTH
Culture: NO GROWTH
Special Requests: ADEQUATE
Special Requests: ADEQUATE

## 2019-01-28 ENCOUNTER — Telehealth: Payer: Self-pay

## 2019-01-28 NOTE — Telephone Encounter (Signed)
Patient called asking if any possible drug interactions with Odefsey and mens complete multivitamin. Routing to pharmacy for advise.  Eugenia Mcalpine, LPN

## 2019-01-28 NOTE — Telephone Encounter (Signed)
Yes there is a possibility of an interaction. Would separate the multivitamin by at least 4 hours from [facility].

## 2019-01-28 NOTE — Telephone Encounter (Signed)
Patient called and made aware to take multivitamin at least 4 hours apart with [facility] Patient appreciative of pharmacist recommendation.  Eugenia Mcalpine, LPN

## 2019-02-16 ENCOUNTER — Other Ambulatory Visit: Payer: Self-pay | Admitting: Infectious Diseases

## 2019-02-17 ENCOUNTER — Other Ambulatory Visit: Payer: Self-pay

## 2019-02-17 ENCOUNTER — Encounter: Payer: Self-pay | Admitting: Internal Medicine

## 2019-02-17 ENCOUNTER — Ambulatory Visit: Payer: Self-pay | Admitting: Internal Medicine

## 2019-02-17 VITALS — BP 121/81 | HR 94 | Temp 98.0°F | Ht 67.0 in | Wt 235.0 lb

## 2019-02-17 DIAGNOSIS — Z8616 Personal history of COVID-19: Secondary | ICD-10-CM

## 2019-02-17 DIAGNOSIS — B20 Human immunodeficiency virus [HIV] disease: Secondary | ICD-10-CM

## 2019-02-17 DIAGNOSIS — E1159 Type 2 diabetes mellitus with other circulatory complications: Secondary | ICD-10-CM

## 2019-02-17 DIAGNOSIS — Z8619 Personal history of other infectious and parasitic diseases: Secondary | ICD-10-CM

## 2019-02-17 DIAGNOSIS — I152 Hypertension secondary to endocrine disorders: Secondary | ICD-10-CM

## 2019-02-17 DIAGNOSIS — E118 Type 2 diabetes mellitus with unspecified complications: Secondary | ICD-10-CM

## 2019-02-17 DIAGNOSIS — Z794 Long term (current) use of insulin: Secondary | ICD-10-CM

## 2019-02-17 DIAGNOSIS — I10 Essential (primary) hypertension: Secondary | ICD-10-CM

## 2019-02-17 DIAGNOSIS — R918 Other nonspecific abnormal finding of lung field: Secondary | ICD-10-CM

## 2019-02-17 DIAGNOSIS — Z8701 Personal history of pneumonia (recurrent): Secondary | ICD-10-CM

## 2019-02-17 DIAGNOSIS — Z0000 Encounter for general adult medical examination without abnormal findings: Secondary | ICD-10-CM

## 2019-02-17 DIAGNOSIS — E1165 Type 2 diabetes mellitus with hyperglycemia: Secondary | ICD-10-CM

## 2019-02-17 DIAGNOSIS — R911 Solitary pulmonary nodule: Secondary | ICD-10-CM

## 2019-02-17 DIAGNOSIS — Z79899 Other long term (current) drug therapy: Secondary | ICD-10-CM

## 2019-02-17 LAB — GLUCOSE, CAPILLARY: Glucose-Capillary: 92 mg/dL (ref 70–99)

## 2019-02-17 MED ORDER — LISINOPRIL 10 MG PO TABS: 10.0000 mg | ORAL_TABLET | Freq: Every day | ORAL | 0 refills | Status: DC

## 2019-02-17 NOTE — Assessment & Plan Note (Signed)
Deferred colonoscopy and ophthalmology referral at this time as patient is self-pay.  He states that he is in the process of getting insurance and I will place order after this is not urgent at this time.  -Ordered urine microalbumin.

## 2019-02-17 NOTE — Assessment & Plan Note (Signed)
Patient is being followed by our [facility].  Last appointment with them was in March 2020 during which time he was told to continue [facility]. His viral load was not detectable in February 2020.  His CD4 count was 463 in July 2019.    Assessment and plan  -Patient to follow with Dr. Tommy Medal in September 2020 -Continue [facility] daily -Reminded patient to space his multivitamins at least 4 hours apart with his Odefsey.

## 2019-02-17 NOTE — Assessment & Plan Note (Signed)
Patient had follow-up chest CT done in November 2019 which showed stable 7 mm right lower lobe subpleural lung nodule in comparison to March 2019.  He is to get a follow-up CT in 18 to 24 months (November 2020-March 2021)

## 2019-02-17 NOTE — Assessment & Plan Note (Signed)
Patient was recently admitted in April for lobar pneumonia secondary to COVID-19 infection  from 01/12/2019-01/13/19.  The patient was afebrile, saturating on room air and was comfortable.  His d-dimer was 0.49, C-reactive protein 10.5, fibrinogen 686, LDH 292, steroids 96, ferritin 424, and troponin less than 0.03.  Therefore, the patient was discharged with 4-day course of levofloxacin to treat for possible additional bacterial pneumonia.  -Counseled the patient on slowly resuming his activities in the community.  I explained to him that he has been 1 month free of symptoms and therefore there is a very low possibility of him transmitting the virus at this time. -I encouraged the patient to continue wearing a mask, protective equipment, social distancing.

## 2019-02-17 NOTE — Assessment & Plan Note (Signed)
" >>  ASSESSMENT AND PLAN FOR HUMAN IMMUNODEFICIENCY VIRUS (HIV) DISEASE (HCC) WRITTEN ON 02/17/2019  3:35 PM BY Joey Hudock, MD  Patient is being followed by our [facility].  Last appointment with them was in March 2020 during which time he was told to continue Odefsey . His viral load was not detectable in February 2020.  His CD4 count was 463 in July 2019.    Assessment and plan  -Patient to follow with Dr. Fleeta Rothman in September 2020 -Continue Odefsey  daily -Reminded patient to space his multivitamins at least 4 hours apart with his Odefsey . "

## 2019-02-17 NOTE — Progress Notes (Signed)
   CC: Diabetes Mellitus Follow Up  HPI:  [name] is a 53 y.o. male living with HIV, hypertension, diabetes mellitus type 2, pulmonary nodules who presents for diabetes follow-up. Please see problem based charting for evaluation, assessment, and plan.  Past Medical History:  Diagnosis Date  . Dizziness 03/13/2017  . Flu-like symptoms 12/02/2018  . HIV (human immunodeficiency virus infection) ([place]) 2009   11/20/2012 Last CD4 count 210 and VL <20  . 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 Hypertension 2009   Well controlled on HCTZ  . Otitis media 03/13/2017  . Polyuria 03/13/2017  . Poorly controlled diabetes mellitus ([place]) 03/13/2017  . Type 2 diabetes mellitus (HCC)    Well controlled on metformin  . Weight loss 03/13/2017   Review of Systems:    Occasional shortness of breath with exertion twice a month and felt dizzy once last week Denies nausea, vomiting, fevers, chest pain  Physical Exam:  Vitals:   02/17/19 1442  BP: 121/81  Pulse: 94  Temp: 98 F (36.7 C)  SpO2: 99%   Physical Exam  Constitutional: Appears well-developed and well-nourished. No distress.  HENT:  Head: Normocephalic and atraumatic.  Eyes: Conjunctivae are normal. Mouth: Moist mucous membranes, no posterior pharyngeal erythema or exudate noted Cardiovascular: Normal rate, regular rhythm and normal heart sounds.  Respiratory: Effort normal and breath sounds normal. No respiratory distress. No wheezes.  GI: Soft. Bowel sounds are normal. No distension. There is no tenderness.  Musculoskeletal: No edema.  Neurological: Is alert.  Skin: Not diaphoretic. No erythema.  Psychiatric: Normal mood and affect. Behavior is normal. Judgment and thought content normal.    Assessment & Plan:   See Encounters Tab for problem based charting.  Patient discussed with Dr. Rebeca Alert

## 2019-02-17 NOTE — Patient Instructions (Addendum)
It was a pleasure to see you today Mr. Culhane. I am happy to see that you are doing well. Please continue to remain safe during the COVID19 pandemic by social distancing and wearing protective equipment. Please also do the following:   -Please stop hctz -Please start lisinopril 10mg  qd -Please continue to eat healthy and exercise  -I will let you know if we get any abnormal lab results  -Follow up in 2 months   If you have any questions or concerns, please call our clinic at [phone] between 9am-5pm and after hours call [phone] and ask for the internal medicine resident on call. If you feel you are having a medical emergency please call 911.   Thank you, we look forward to help you remain healthy!  Lars Mage, MD Internal Medicine PGY2

## 2019-02-17 NOTE — Assessment & Plan Note (Signed)
The patient's blood pressure during this visit was 121/81,94. The patient is currently taking hydrochlorothiazide 25 mg daily, lisinopril 10 mg daily. His last blood pressure visits are   BP Readings from Last 3 Encounters:  01/13/19 127/82  12/02/18 123/86  11/19/18 128/89   The patient does not report palpitations, chest pain, sob.  He had one episode of dizziness last month which resolved.  I informed him to monitor for this in the future.  Assessment and Plan The patient's blood pressure during this visit is controlled but the patient states that he is not taking lisinopril and slowly taking hydrochlorothiazide.  Due to the patient's diabetes history I would like for the patient to take lisinopril as ACE inhibitor as are renal protective.  -Stop hydrochlorothiazide -Started lisinopril 10 mg daily.  No known interactions between [facility] and lisinopril

## 2019-02-17 NOTE — Assessment & Plan Note (Signed)
The patient's last a1c= 9.6 on April 2020. The patient's home blood glucose measurements over the past month have ranged 100-160s.   The patient is currently taking Lantus 20 units nightly, Victoza 1.2 mg daily, metformin 1000mg  twice daily. The patient has missed some doses of his medications.   Patient's weight changes 1 pound weight loss since last month, but overall approximately15lbs since last year.  Assessment and plan Congratulated the patient on his weight loss.  Encouraged him to continue current regimen without missing any doses.  Suggested he use a alarm to remind him to take his medication.  We will check his A1c in 2 months and make any necessary changes at that time.

## 2019-02-18 ENCOUNTER — Encounter: Payer: Self-pay | Admitting: Internal Medicine

## 2019-02-18 LAB — MICROALBUMIN / CREATININE URINE RATIO
Creatinine, Urine: 173.9 mg/dL
Microalb/Creat Ratio: 3 mg/g creat (ref 0–29)
Microalbumin, Urine: 5.8 ug/mL

## 2019-02-19 NOTE — Progress Notes (Signed)
Internal Medicine Clinic Attending  Case discussed with Dr. Chundi at the time of the visit.  We reviewed the resident's history and exam and pertinent patient test results.  I agree with the assessment, diagnosis, and plan of care documented in the resident's note.  Alexander Raines, M.D., Ph.D.  

## 2019-02-20 ENCOUNTER — Encounter: Payer: Self-pay | Admitting: Internal Medicine

## 2019-02-25 ENCOUNTER — Telehealth: Payer: Self-pay | Admitting: *Deleted

## 2019-02-25 MED FILL — LISINOPRIL 10 MG TABS: 10 | 30 days supply | Qty: 30 | Fill #0

## 2019-02-25 NOTE — Telephone Encounter (Signed)
I called pt to see if he would be interested and willing to donate plasma for the possible treatment of the COVID-19 virus. 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 I thanked him for his time.

## 2019-03-26 MED FILL — LISINOPRIL 10 MG TABS: 10 | 30 days supply | Qty: 30 | Fill #1

## 2019-04-02 ENCOUNTER — Encounter: Payer: Self-pay | Admitting: *Deleted

## 2019-04-15 ENCOUNTER — Other Ambulatory Visit: Payer: Self-pay | Admitting: Internal Medicine

## 2019-04-15 DIAGNOSIS — E1165 Type 2 diabetes mellitus with hyperglycemia: Secondary | ICD-10-CM

## 2019-04-17 MED FILL — LANTUS SOLOSTAR 100 UNITS/M: 100 | 15 days supply | Qty: 3 | Fill #0

## 2019-04-24 ENCOUNTER — Other Ambulatory Visit: Payer: Self-pay

## 2019-04-24 DIAGNOSIS — E1165 Type 2 diabetes mellitus with hyperglycemia: Secondary | ICD-10-CM

## 2019-04-24 DIAGNOSIS — I152 Hypertension secondary to endocrine disorders: Secondary | ICD-10-CM

## 2019-04-24 DIAGNOSIS — E1159 Type 2 diabetes mellitus with other circulatory complications: Secondary | ICD-10-CM

## 2019-04-24 NOTE — Telephone Encounter (Signed)
liraglutide (VICTOZA) 18 MG/3ML SOPN, refill request @  [id number] - [place], [place] [phone] (Phone) [phone] (Fax)     lisinopril (ZESTRIL) 10 MG tablet, refill request @  [place], [place] - [address]. [phone] (Phone) [phone] (Fax)

## 2019-04-28 ENCOUNTER — Other Ambulatory Visit: Payer: Self-pay | Admitting: Internal Medicine

## 2019-04-28 ENCOUNTER — Encounter: Payer: Self-pay | Admitting: Infectious Disease

## 2019-04-28 DIAGNOSIS — E1165 Type 2 diabetes mellitus with hyperglycemia: Secondary | ICD-10-CM

## 2019-04-28 DIAGNOSIS — I152 Hypertension secondary to endocrine disorders: Secondary | ICD-10-CM

## 2019-04-28 DIAGNOSIS — E1159 Type 2 diabetes mellitus with other circulatory complications: Secondary | ICD-10-CM

## 2019-04-28 MED ORDER — LANTUS SOLOSTAR 100 UNIT/ML ~~LOC~~ SOPN: 20.0000 [IU] | PEN_INJECTOR | Freq: Every day | SUBCUTANEOUS | 1 refills | Status: DC

## 2019-04-28 MED ORDER — LISINOPRIL 10 MG PO TABS: 10.0000 mg | ORAL_TABLET | Freq: Every day | ORAL | 1 refills | Status: DC

## 2019-04-28 NOTE — Telephone Encounter (Signed)
Needs refill on LANTUS SOLOSTAR 100 UNIT/ML Solostar Pen lisinopril (ZESTRIL) 10 MG tablet  ; pt contact [phone]   [facility] on [address] [place]

## 2019-04-30 MED ORDER — VICTOZA 18 MG/3ML ~~LOC~~ SOPN: PEN_INJECTOR | SUBCUTANEOUS | 1 refills | Status: DC

## 2019-04-30 NOTE — Telephone Encounter (Signed)
Call from patient checking on the status Victoza refill.  States he has picked up the lisinopril.  Per patient-he will no longer use the [place] and requests all refills go to [facility] and [facility]. Will send to pcp for review.Marland Kitchenkg

## 2019-05-05 ENCOUNTER — Encounter: Payer: Self-pay | Admitting: Internal Medicine

## 2019-05-20 ENCOUNTER — Other Ambulatory Visit: Payer: Self-pay

## 2019-05-20 DIAGNOSIS — B20 Human immunodeficiency virus [HIV] disease: Secondary | ICD-10-CM

## 2019-05-21 ENCOUNTER — Other Ambulatory Visit: Payer: Self-pay | Admitting: *Deleted

## 2019-05-21 DIAGNOSIS — E1165 Type 2 diabetes mellitus with hyperglycemia: Secondary | ICD-10-CM

## 2019-05-21 LAB — T-HELPER CELL (CD4) - (RCID CLINIC ONLY)
CD4 % Helper T Cell: 20 % — ABNORMAL LOW (ref 33–65)
CD4 T Cell Abs: 504 /uL (ref 400–1790)

## 2019-05-21 NOTE — Telephone Encounter (Signed)
Received fax request from [facility] to refill patient's Victoza with 90 day supply to help with adherence. Hubbard Hartshorn, RN, BSN

## 2019-05-23 LAB — COMPLETE METABOLIC PANEL WITH GFR
AG Ratio: 1.5 (calc) (ref 1.0–2.5)
ALT: 12 U/L (ref 9–46)
AST: 9 U/L — ABNORMAL LOW (ref 10–35)
Albumin: 4.1 g/dL (ref 3.6–5.1)
Alkaline phosphatase (APISO): 46 U/L (ref 35–144)
BUN: 8 mg/dL (ref 7–25)
CO2: 29 mmol/L (ref 20–32)
Calcium: 9.3 mg/dL (ref 8.6–10.3)
Chloride: 101 mmol/L (ref 98–110)
Creat: 1.16 mg/dL (ref 0.70–1.33)
GFR, Est African American: 82 mL/min/{1.73_m2} (ref >=60)
GFR, Est Non African American: 71 mL/min/{1.73_m2} (ref >=60)
Globulin: 2.7 g/dL (calc) (ref 1.9–3.7)
Glucose, Bld: 100 mg/dL — ABNORMAL HIGH (ref 65–99)
Potassium: 4.6 mmol/L (ref 3.5–5.3)
Sodium: 137 mmol/L (ref 135–146)
Total Bilirubin: 0.4 mg/dL (ref 0.2–1.2)
Total Protein: 6.8 g/dL (ref 6.1–8.1)

## 2019-05-23 LAB — CBC WITH DIFFERENTIAL/PLATELET
Absolute Monocytes: 437 cells/uL (ref 200–950)
Basophils Absolute: 57 cells/uL (ref 0–200)
Basophils Relative: 1.1 %
Eosinophils Absolute: 140 cells/uL (ref 15–500)
Eosinophils Relative: 2.7 %
HCT: 50.1 % — ABNORMAL HIGH (ref 38.5–50.0)
Hemoglobin: 16.5 g/dL (ref 13.2–17.1)
Lymphs Abs: 2912 cells/uL (ref 850–3900)
MCH: 29.5 pg (ref 27.0–33.0)
MCHC: 32.9 g/dL (ref 32.0–36.0)
MCV: 89.6 fL (ref 80.0–100.0)
MPV: 9.5 fL (ref 7.5–12.5)
Monocytes Relative: 8.4 %
Neutro Abs: 1654 cells/uL (ref 1500–7800)
Neutrophils Relative %: 31.8 %
Platelets: 330 10*3/uL (ref 140–400)
RBC: 5.59 10*6/uL (ref 4.20–5.80)
RDW: 13.1 % (ref 11.0–15.0)
Total Lymphocyte: 56 %
WBC: 5.2 10*3/uL (ref 3.8–10.8)

## 2019-05-23 LAB — HIV-1 RNA QUANT-NO REFLEX-BLD
HIV 1 RNA Quant: <20 copies/mL
HIV-1 RNA Quant, Log: <1.3 Log copies/mL

## 2019-05-23 LAB — RPR: RPR Ser Ql: NONREACTIVE

## 2019-05-25 MED ORDER — VICTOZA 18 MG/3ML ~~LOC~~ SOPN: PEN_INJECTOR | SUBCUTANEOUS | 1 refills | Status: DC

## 2019-06-03 ENCOUNTER — Other Ambulatory Visit: Payer: Self-pay

## 2019-06-03 ENCOUNTER — Encounter: Payer: Self-pay | Admitting: Infectious Disease

## 2019-06-03 ENCOUNTER — Ambulatory Visit (INDEPENDENT_AMBULATORY_CARE_PROVIDER_SITE_OTHER): Payer: Self-pay | Admitting: Infectious Disease

## 2019-06-03 DIAGNOSIS — E1159 Type 2 diabetes mellitus with other circulatory complications: Secondary | ICD-10-CM

## 2019-06-03 DIAGNOSIS — I10 Essential (primary) hypertension: Secondary | ICD-10-CM

## 2019-06-03 DIAGNOSIS — Z8616 Personal history of COVID-19: Secondary | ICD-10-CM

## 2019-06-03 DIAGNOSIS — E785 Hyperlipidemia, unspecified: Secondary | ICD-10-CM

## 2019-06-03 DIAGNOSIS — E1169 Type 2 diabetes mellitus with other specified complication: Secondary | ICD-10-CM

## 2019-06-03 DIAGNOSIS — E1165 Type 2 diabetes mellitus with hyperglycemia: Secondary | ICD-10-CM

## 2019-06-03 DIAGNOSIS — B20 Human immunodeficiency virus [HIV] disease: Secondary | ICD-10-CM

## 2019-06-03 DIAGNOSIS — I152 Hypertension secondary to endocrine disorders: Secondary | ICD-10-CM

## 2019-06-03 DIAGNOSIS — Z8619 Personal history of other infectious and parasitic diseases: Secondary | ICD-10-CM

## 2019-06-03 MED ORDER — ODEFSEY 200-25-25 MG PO TABS: 1.0000 | ORAL_TABLET | Freq: Every day | ORAL | 11 refills | Status: DC

## 2019-06-03 NOTE — Progress Notes (Signed)
Virtual Visit via Telephone Note  I connected with [name] on 06/03/19 at  9:45 AM EDT by telephone and verified that I am speaking with the correct person using two identifiers.  Location: Patient: Home Provider: Home   I discussed the limitations, risks, security and privacy concerns of performing an evaluation and management service by telephone and the availability of in person appointments. I also discussed with the patient that there may be a patient responsible charge related to this service. The patient expressed understanding and agreed to proceed.  Chief complaint: Follow-up for HIV disease on medications History of Present Illness:  Rishabh is a 53 year old African-American man living with HIV that has been perfectly controlled on Odefsey.  His most recent viral load was undetectable and his CD4 count was healthy.  When I last saw Lowel in the springtime in early March he was recovering some what the time we thought were flulike symptoms.  Later in early April he was actually hospitalized with coronavirus 2019.  He tells me that he contracted it from family members.  Apparently he had an uncle who was dying from respiratory illness at home and there was a funeral in which multiple family members were coughing.  At that time they thought this was due to problems with allergies but undoubtedly they all had novel coronavirus 2019.  Orpheus as mentioned succumbed to the infection was hospitalized and then quarantined for 2 weeks after hospitalization.  He has fully recovered.  He has been able to bring down his A1c from 13.7-9.7.  He does have an appointment with internal medicine in a few weeks.  Otherwise he is doing well and he has no other complaints on 12 point review of systems   Observations/Objective:  Lizandro seems to be doing well and has recovered from his coronavirus infection he is on disability and living alone at home.  He is highly adherent to his  antiretrovirals  Assessment and Plan:  HIV disease: Continue Odefsey with chewable meals and avoidance of antacids  Diabetes mellitus: Should continue to follow-up with internal medicine note the HIV medication assistance program does cover some types of insulin so it might be wise to have some of his insulin filled through this program at the [facility] in [place].  Hyperlipidemia remains on a statin  Novel coronavirus 2019: I have advised him to continue to exercise precautions his infection in April may have conferred some immunity to him but is not clear how long such immunity last.  Hypertension: Continue current medications Need for vaccination he should come in and get a flu shot in our clinic  Follow Up Instructions:    I discussed the assessment and treatment plan with the patient. The patient was provided an opportunity to ask questions and all were answered. The patient agreed with the plan and demonstrated an understanding of the instructions.   The patient was advised to call back or seek an in-person evaluation if the symptoms worsen or if the condition fails to improve as anticipated.  I provided 21 minutes of non-face-to-face time during this encounter.   Alcide Evener, MD

## 2019-06-11 ENCOUNTER — Other Ambulatory Visit: Payer: Self-pay

## 2019-06-11 ENCOUNTER — Ambulatory Visit (INDEPENDENT_AMBULATORY_CARE_PROVIDER_SITE_OTHER): Payer: Self-pay

## 2019-06-11 DIAGNOSIS — Z23 Encounter for immunization: Secondary | ICD-10-CM

## 2019-06-14 NOTE — Progress Notes (Deleted)
   CC: ***  HPI:  [name] is a 53 y.o. male living with hiv, hypertension, diabetes type 2, and hld who is presenting for htn follow up. vass  htn  The patient's blood pressure during this visit was ***. The patient is currently taking lisinopril 10mg  qd. His last blood pressure visits are  BP Readings from Last 3 Encounters:  02/17/19 121/81  01/13/19 127/82  12/02/18 123/86    The patient does/does not *** report palpitations, dizziness, chest pain, sob ***.  Assessment and Plan ***  DM The patient's last a1c=*** on ***. The patient's home  blood glucose measurements over the past month have ranged ***. The patient does/does not note episodes of hypoglycemia.   The patient is currently taking lantus 20u qhs, liraglutide 1.2mg  qd, and metformin 1000mg  bid. The patient is compliant with medication.    Patient's weight changes ***    Past Medical History:  Diagnosis Date  . Acute respiratory failure with hypoxia ([place]) 01/12/2019  . Dizziness 03/13/2017  . Flu-like symptoms 12/02/2018  . Flu-like symptoms 12/02/2018  . HIV (human immunodeficiency virus infection) ([place]) 2009   11/20/2012 Last CD4 count 210 and VL <20  . 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 Hypertension 2009   Well controlled on HCTZ  . Hypokalemia 01/12/2019  . Lobar pneumonia ([place]) 01/12/2019  . Otitis media 03/13/2017  . Polyuria 03/13/2017  . Poorly controlled diabetes mellitus ([place]) 03/13/2017  . Type 2 diabetes mellitus (HCC)    Well controlled on metformin  . Weight loss 03/13/2017   Review of Systems:  ***  Physical Exam:  There were no vitals filed for this visit. ***  Assessment & Plan:   See Encounters Tab for problem based charting.  Patient {GC/GE:[id number]::"discussed with","seen with"} Dr. {NAMES:[id number]::"Butcher","Granfortuna","E. Hoffman","Mullen","Narendra","Raines","Vincent"}

## 2019-06-16 ENCOUNTER — Encounter: Payer: Self-pay | Admitting: Internal Medicine

## 2019-06-16 ENCOUNTER — Ambulatory Visit (INDEPENDENT_AMBULATORY_CARE_PROVIDER_SITE_OTHER): Payer: Self-pay | Admitting: Internal Medicine

## 2019-06-16 ENCOUNTER — Other Ambulatory Visit: Payer: Self-pay

## 2019-06-16 VITALS — BP 132/84 | HR 90 | Temp 98.5°F | Ht 66.0 in | Wt 239.7 lb

## 2019-06-16 DIAGNOSIS — E1165 Type 2 diabetes mellitus with hyperglycemia: Secondary | ICD-10-CM

## 2019-06-16 DIAGNOSIS — E1159 Type 2 diabetes mellitus with other circulatory complications: Secondary | ICD-10-CM

## 2019-06-16 DIAGNOSIS — I10 Essential (primary) hypertension: Secondary | ICD-10-CM

## 2019-06-16 DIAGNOSIS — Z79899 Other long term (current) drug therapy: Secondary | ICD-10-CM

## 2019-06-16 DIAGNOSIS — B20 Human immunodeficiency virus [HIV] disease: Secondary | ICD-10-CM

## 2019-06-16 DIAGNOSIS — I152 Hypertension secondary to endocrine disorders: Secondary | ICD-10-CM

## 2019-06-16 DIAGNOSIS — Z794 Long term (current) use of insulin: Secondary | ICD-10-CM

## 2019-06-16 DIAGNOSIS — E118 Type 2 diabetes mellitus with unspecified complications: Secondary | ICD-10-CM

## 2019-06-16 DIAGNOSIS — Z0000 Encounter for general adult medical examination without abnormal findings: Secondary | ICD-10-CM

## 2019-06-16 DIAGNOSIS — R911 Solitary pulmonary nodule: Secondary | ICD-10-CM

## 2019-06-16 LAB — POCT GLYCOSYLATED HEMOGLOBIN (HGB A1C): Hemoglobin A1C: 6.7 % — AB (ref 4.0–5.6)

## 2019-06-16 LAB — GLUCOSE, CAPILLARY: Glucose-Capillary: 74 mg/dL (ref 70–99)

## 2019-06-16 MED ORDER — LANTUS SOLOSTAR 100 UNIT/ML ~~LOC~~ SOPN: 20.0000 [IU] | PEN_INJECTOR | Freq: Every day | SUBCUTANEOUS | 2 refills | Status: DC

## 2019-06-16 NOTE — Assessment & Plan Note (Signed)
The patient's blood pressure during this visit was 132/84. The patient is currently taking lisinopril 10mg  qd. His last blood pressure visits are   BP Readings from Last 3 Encounters:  06/16/19 132/84  02/17/19 121/81  01/13/19 127/82    The patient does not report palpitations, dizziness, chest pain, sob.  Assessment and Plan Blood pressure well controlled. Continue lisinopril 10mg  qd

## 2019-06-16 NOTE — Assessment & Plan Note (Addendum)
Patient's last viral load not detected and cd4 504 in august 2020. He has been taking odefsey.   -continue to take odefsey  -follow with [facility] -prescribed shingrix vaccine to get at pharmacy

## 2019-06-16 NOTE — Progress Notes (Signed)
   CC: Diabetes and hypertension follow up   HPI:  [name] is a 53 y.o. male living with HIV, hypertension, diabetes mellitus type 2 who presents for follow up. Please see problem based charting for evaluation, assessment, and plan.  Past Medical History:  Diagnosis Date  . Acute respiratory failure with hypoxia ([place]) 01/12/2019  . Dizziness 03/13/2017  . Flu-like symptoms 12/02/2018  . Flu-like symptoms 12/02/2018  . HIV (human immunodeficiency virus infection) ([place]) 2009   11/20/2012 Last CD4 count 210 and VL <20  . 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 Hypertension 2009   Well controlled on HCTZ  . Hypokalemia 01/12/2019  . Lobar pneumonia ([place]) 01/12/2019  . Otitis media 03/13/2017  . Polyuria 03/13/2017  . Poorly controlled diabetes mellitus ([place]) 03/13/2017  . Type 2 diabetes mellitus (HCC)    Well controlled on metformin  . Weight loss 03/13/2017   Review of Systems:    Review of Systems  Constitutional: Negative for chills and fever.  Respiratory: Negative for shortness of breath.   Cardiovascular: Negative for chest pain.  Gastrointestinal: Negative for abdominal pain, nausea and vomiting.  Neurological: Negative for dizziness and headaches.   Physical Exam:  Vitals:   06/16/19 1441  BP: 132/84  Pulse: 90  Temp: 98.5 F (36.9 C)  TempSrc: Oral  SpO2: 98%  Weight: 239 lb 11.2 oz (108.7 kg)  Height: 5\' 6"  (1.676 m)   Physical Exam  Constitutional: Appears well-developed and well-nourished. No distress.  HENT:  Head: Normocephalic and atraumatic.  Eyes: Conjunctivae are normal.  Cardiovascular: Normal rate, regular rhythm and normal heart sounds.  Respiratory: Effort normal and breath sounds normal. No respiratory distress. No wheezes.  GI: Soft. Bowel sounds are normal. No distension. There is no tenderness.  Musculoskeletal: No edema.  Neurological: Is alert.  Skin: Not diaphoretic. No erythema.  Psychiatric: Normal mood and affect.  Behavior is normal. Judgment and thought content normal.    Assessment & Plan:   See Encounters Tab for problem based charting.      Patient discussed with Dr. Lynnae January

## 2019-06-16 NOTE — Assessment & Plan Note (Signed)
" >>  ASSESSMENT AND PLAN FOR HUMAN IMMUNODEFICIENCY VIRUS (HIV) DISEASE (HCC) WRITTEN ON 06/16/2019  3:41 PM BY KELBY SCHATZ, MD  Patient's last viral load not detected and cd4 504 in august 2020. He has been taking odefsey .   -continue to take odefsey   -follow with [facility] -prescribed shingrix  vaccine to get at pharmacy "

## 2019-06-16 NOTE — Assessment & Plan Note (Signed)
Referred patient to get colonoscopy  Patient already got influenza vaccine at [facility] last week  Patient requested that he get shingrix vaccine. He is eligible to get vaccine as his hiv is well controlled.   Prescribed him a written prescription which I have included a photo of in main note. He is to get two doses of shingrix 2-6 months apart.

## 2019-06-16 NOTE — Assessment & Plan Note (Signed)
Patient has a 25mm in size pulmonary nodule in right lower lobe that was found on CT November 2019. He needs to have a repeat CT chest done 18-24 months from March 2019.  -ordered CT chest wo contrast

## 2019-06-16 NOTE — Assessment & Plan Note (Signed)
The patient's last a1c=9.4 in April 2020 and during this visit it was 6.7 with random blood glucose of 74. Patient was given something to drink in clinic.   The patient's home blood glucose measurements over the past month have ranged 80-220s. The patient does not note episodes of hypoglycemia.   The patient is currently taking metformin 1000mg  bid, liraglutide 1.2mg  qd, lantus 20u qhs. The patient is compliant with medication.    Patient has gained 4 lbs in the past 4 months.  Assessment and plan  Patient's blood glucose levels appear to be well controlled, but I am worried that he might be having some asymptomatic hypoglycemic episodes.   -continue lantus 20u qhs -continue metformin 1000mg  bid  -continue liraglutide 1.2mg  qd -requested patient to document his blood glucose readings over the next 2 weeks so we can assess for any hypoglycemia  -Follow up in 2 weeks

## 2019-06-16 NOTE — Patient Instructions (Signed)
It was a pleasure to see you today [name]. Please make the following changes:  CONGRATULATIONS ON YOUR A1C! I am so happy for you [name]! Continue the good work. Continue to take your medication,decrease intake of sugary substances, exercise 30 minutes daily.   Please write down glucose readings 3-4 times daily and follow up in 2 weeks so we can make sure you do not have low blood glucose readings.   I have written scripts for shingrix (Shingles Vaccine) that you can take to any pharmacy. It is administered as two doses which are separated in time by 2-6 months   Please get CT chest done   Please get colonoscopy done   If you have any questions or concerns, please call our clinic at [phone] between 9am-5pm and after hours call [phone] and ask for the internal medicine resident on call. If you feel you are having a medical emergency please call 911.   Thank you, we look forward to help you remain healthy!  Lars Mage, MD Internal Medicine PGY3

## 2019-06-22 NOTE — Progress Notes (Signed)
Internal Medicine Clinic Attending  Case discussed with Dr. Chundi at the time of the visit.  We reviewed the resident's history and exam and pertinent patient test results.  I agree with the assessment, diagnosis, and plan of care documented in the resident's note. 

## 2019-06-25 ENCOUNTER — Other Ambulatory Visit: Payer: Self-pay

## 2019-06-25 DIAGNOSIS — Z20822 Contact with and (suspected) exposure to covid-19: Secondary | ICD-10-CM

## 2019-06-27 LAB — NOVEL CORONAVIRUS, NAA: SARS-CoV-2, NAA: NOT DETECTED

## 2019-06-30 ENCOUNTER — Other Ambulatory Visit: Payer: Self-pay

## 2019-06-30 ENCOUNTER — Encounter (INDEPENDENT_AMBULATORY_CARE_PROVIDER_SITE_OTHER): Payer: Self-pay

## 2019-06-30 ENCOUNTER — Ambulatory Visit (INDEPENDENT_AMBULATORY_CARE_PROVIDER_SITE_OTHER): Payer: Self-pay | Admitting: Internal Medicine

## 2019-06-30 ENCOUNTER — Encounter: Payer: Self-pay | Admitting: Internal Medicine

## 2019-06-30 VITALS — BP 123/86 | HR 98 | Temp 98.0°F | Ht 66.0 in | Wt 238.2 lb

## 2019-06-30 DIAGNOSIS — Z21 Asymptomatic human immunodeficiency virus [HIV] infection status: Secondary | ICD-10-CM

## 2019-06-30 DIAGNOSIS — Z794 Long term (current) use of insulin: Secondary | ICD-10-CM

## 2019-06-30 DIAGNOSIS — Z79899 Other long term (current) drug therapy: Secondary | ICD-10-CM

## 2019-06-30 DIAGNOSIS — I10 Essential (primary) hypertension: Secondary | ICD-10-CM

## 2019-06-30 DIAGNOSIS — E1159 Type 2 diabetes mellitus with other circulatory complications: Secondary | ICD-10-CM

## 2019-06-30 DIAGNOSIS — I152 Hypertension secondary to endocrine disorders: Secondary | ICD-10-CM

## 2019-06-30 DIAGNOSIS — Z1211 Encounter for screening for malignant neoplasm of colon: Secondary | ICD-10-CM

## 2019-06-30 DIAGNOSIS — E1165 Type 2 diabetes mellitus with hyperglycemia: Secondary | ICD-10-CM

## 2019-06-30 DIAGNOSIS — R911 Solitary pulmonary nodule: Secondary | ICD-10-CM

## 2019-06-30 MED ORDER — LANTUS SOLOSTAR 100 UNIT/ML ~~LOC~~ SOPN: 20.0000 [IU] | PEN_INJECTOR | Freq: Every day | SUBCUTANEOUS | 2 refills | Status: DC

## 2019-06-30 NOTE — Progress Notes (Signed)
Internal Medicine Clinic Attending  Case discussed with Dr. Chundi at the time of the visit.  We reviewed the resident's history and exam and pertinent patient test results.  I agree with the assessment, diagnosis, and plan of care documented in the resident's note. 

## 2019-06-30 NOTE — Progress Notes (Signed)
   CC: Diabetes mellitus follow up  HPI:  [name] is a 53 y.o. male living with hiv, htn, dm2 who presents for diabetes follow up. Please see problem based charting for evaluation, assessment, and plan.  Past Medical History:  Diagnosis Date  . Acute respiratory failure with hypoxia ([place]) 01/12/2019  . Dizziness 03/13/2017  . Flu-like symptoms 12/02/2018  . Flu-like symptoms 12/02/2018  . HIV (human immunodeficiency virus infection) ([place]) 2009   11/20/2012 Last CD4 count 210 and VL <20  . 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 Hypertension 2009   Well controlled on HCTZ  . Hypokalemia 01/12/2019  . Lobar pneumonia ([place]) 01/12/2019  . Otitis media 03/13/2017  . Polyuria 03/13/2017  . Poorly controlled diabetes mellitus ([place]) 03/13/2017  . Type 2 diabetes mellitus (HCC)    Well controlled on metformin  . Weight loss 03/13/2017   Review of Systems:    Review of Systems  Constitutional: Negative for chills and fever.  Respiratory: Negative for cough and shortness of breath.   Cardiovascular: Negative for chest pain.  Gastrointestinal: Negative for abdominal pain, nausea and vomiting.  Musculoskeletal: Negative for myalgias.  Neurological: Negative for dizziness and headaches.   Physical Exam:  Vitals:   06/30/19 1050  BP: 123/86  Pulse: 98  Temp: 98 F (36.7 C)  TempSrc: Oral  SpO2: 98%  Weight: 238 lb 3.2 oz (108 kg)  Height: 5\' 6"  (1.676 m)   Physical Exam  Constitutional: Appears well-developed and well-nourished. No distress.  HENT:  Head: Normocephalic and atraumatic.  Eyes: Conjunctivae are normal.  Cardiovascular: Normal rate, regular rhythm and normal heart sounds.  Respiratory: Effort normal and breath sounds normal. No respiratory distress. No wheezes.  GI: Soft. Bowel sounds are normal. No distension. There is no tenderness.  Musculoskeletal: No pitting edema.  Neurological: Is alert.  Skin: Not diaphoretic. No erythema.   Psychiatric: Normal mood and affect. Behavior is normal. Judgment and thought content normal.        Assessment & Plan:   See Encounters Tab for problem based charting.  Patient discussed with Dr. Lynnae January

## 2019-06-30 NOTE — Assessment & Plan Note (Addendum)
The patient's most recent a1c is 6.7 during visit on 06/16/19 and random blood glucose of 74. The patient is currently taking metformin 1000mg  bid, liraglutide 1.2mg  qd, and lantus 20u qhs.   At last visit there was concern about the patient possibly having asymptomatic hypoglycemic episodes. He was told to return in 2 weeks with recorded blood glucose readings.   Assessment and plan  Reviewed patient's blood glucose records over the past 2 weeks and it did not appear that the patient had any low blood glucose readings. Most blood glucose was ranging 100-202. Therefore, will continue patient's current diabetic regimen.

## 2019-06-30 NOTE — Assessment & Plan Note (Signed)
The patient does not have insurance at this time and wants to wait till he tries to obtain insurance to get colonoscopy. Offered FIC testing, but the patient stated that he would rather wait at this time.

## 2019-06-30 NOTE — Patient Instructions (Signed)
It was a pleasure to see you today Wayne Wolfe. Please make the following changes:  I am happy to see that you do not have any low blood glucose readings. Please continue your current diabetes regimen. I will see you in 3 months.   Please work on getting orange card  If you have any questions or concerns, please call our clinic at [phone] between 9am-5pm and after hours call [phone] and ask for the internal medicine resident on call. If you feel you are having a medical emergency please call 911.   Thank you, we look forward to help you remain healthy!  Lars Mage, MD Internal Medicine PGY3

## 2019-06-30 NOTE — Assessment & Plan Note (Signed)
Patient's blood pressure during this visit is at goal 123/86.   -continue lisinopril 10mg  qd

## 2019-07-01 NOTE — Addendum Note (Signed)
Addended by: Lars Mage on: 07/01/2019 08:19 AM   Modules accepted: Orders

## 2019-07-02 ENCOUNTER — Telehealth: Payer: Self-pay | Admitting: *Deleted

## 2019-07-02 NOTE — Telephone Encounter (Signed)
Fax from [facility]-Lantus is not covered under patient's insurance.  Request change to Levemir Flextouuch , Tyler Aas Flex Pen, Scientist, forensic.  Message to be sent to Dr. Maricela Bo to consider a change.  Sander Nephew, RN 10.1.2020 3:07 PM

## 2019-07-07 ENCOUNTER — Telehealth: Payer: Self-pay | Admitting: *Deleted

## 2019-07-07 DIAGNOSIS — E1165 Type 2 diabetes mellitus with hyperglycemia: Secondary | ICD-10-CM

## 2019-07-07 NOTE — Telephone Encounter (Signed)
Call from [place] - stated pt has insurance which is requesting Lantus Solostar be changed to Levemir or [place]. Thanks

## 2019-07-08 NOTE — Telephone Encounter (Signed)
Ok I will send. Thanks

## 2019-07-08 NOTE — Telephone Encounter (Signed)
If the patient is self pay he should be eligible for $4 at IM pharmacy

## 2019-07-08 NOTE — Telephone Encounter (Signed)
Talked to Wayne Wolfe at [place] [facility] - stated pt has Part D; out of pocket will be >$200.00. Need to change Lantus to Levemir or [place].

## 2019-07-08 NOTE — Telephone Encounter (Signed)
He has insurance per [employer]. His insurance is telling the pharmacy that the do not cover lantus, but will cover basaglar or levimir and tresiba pens.  Not eligible for Im program because he has insurance. They need another prescription recommend basaglar pen ( biosimilar to lantus)  at same dose as lantus.

## 2019-07-09 ENCOUNTER — Other Ambulatory Visit: Payer: Self-pay | Admitting: Internal Medicine

## 2019-07-09 DIAGNOSIS — E1165 Type 2 diabetes mellitus with hyperglycemia: Secondary | ICD-10-CM

## 2019-07-09 MED ORDER — INSULIN DEGLUDEC 100 UNIT/ML ~~LOC~~ SOPN: 20.0000 [IU] | PEN_INJECTOR | Freq: Every day | SUBCUTANEOUS | Status: DC

## 2019-07-14 ENCOUNTER — Other Ambulatory Visit: Payer: Self-pay | Admitting: Internal Medicine

## 2019-07-14 MED ORDER — INSULIN DEGLUDEC 100 UNIT/ML ~~LOC~~ SOPN: 20.0000 [IU] | PEN_INJECTOR | Freq: Every day | SUBCUTANEOUS | 1 refills | Status: DC

## 2019-07-14 NOTE — Telephone Encounter (Signed)
Wayne Wolfe was sent last week

## 2019-07-14 NOTE — Telephone Encounter (Signed)
Has been resent

## 2019-07-14 NOTE — Telephone Encounter (Signed)
It was ordered as Ordered Facility-Administered Medications Not sent to the pharmacy Thanks

## 2019-07-15 ENCOUNTER — Other Ambulatory Visit: Payer: Self-pay

## 2019-07-15 ENCOUNTER — Ambulatory Visit (HOSPITAL_COMMUNITY)
Admission: RE | Admit: 2019-07-15 | Discharge: 2019-07-15 | Disposition: A | Discharge status: Home / Self Care | Payer: Self-pay | Source: Ambulatory Visit | Attending: Internal Medicine | Admitting: Internal Medicine

## 2019-07-15 DIAGNOSIS — R911 Solitary pulmonary nodule: Secondary | ICD-10-CM | POA: Insufficient documentation

## 2019-07-16 ENCOUNTER — Encounter: Payer: Self-pay | Admitting: Internal Medicine

## 2019-07-19 ENCOUNTER — Other Ambulatory Visit: Payer: Self-pay | Admitting: Infectious Disease

## 2019-07-19 DIAGNOSIS — E785 Hyperlipidemia, unspecified: Secondary | ICD-10-CM

## 2019-07-19 DIAGNOSIS — E1169 Type 2 diabetes mellitus with other specified complication: Secondary | ICD-10-CM

## 2019-07-20 ENCOUNTER — Other Ambulatory Visit: Payer: Self-pay | Admitting: *Deleted

## 2019-07-20 MED ORDER — METFORMIN HCL ER 500 MG PO TB24: 1000.0000 mg | ORAL_TABLET | Freq: Two times a day (BID) | ORAL | 1 refills | Status: DC

## 2019-08-04 ENCOUNTER — Other Ambulatory Visit: Payer: Self-pay | Admitting: *Deleted

## 2019-08-04 DIAGNOSIS — B20 Human immunodeficiency virus [HIV] disease: Secondary | ICD-10-CM

## 2019-08-05 ENCOUNTER — Other Ambulatory Visit: Payer: Self-pay

## 2019-08-10 ENCOUNTER — Other Ambulatory Visit: Payer: Self-pay

## 2019-08-10 DIAGNOSIS — B20 Human immunodeficiency virus [HIV] disease: Secondary | ICD-10-CM

## 2019-08-11 ENCOUNTER — Other Ambulatory Visit: Payer: Self-pay

## 2019-08-11 LAB — T-HELPER CELL (CD4) - (RCID CLINIC ONLY)
CD4 % Helper T Cell: 19 % — ABNORMAL LOW (ref 33–65)
CD4 T Cell Abs: 430 /uL (ref 400–1790)

## 2019-08-13 LAB — CBC WITH DIFFERENTIAL/PLATELET
Absolute Monocytes: 445 cells/uL (ref 200–950)
Basophils Absolute: 61 cells/uL (ref 0–200)
Basophils Relative: 1.6 %
Eosinophils Absolute: 118 cells/uL (ref 15–500)
Eosinophils Relative: 3.1 %
HCT: 48.2 % (ref 38.5–50.0)
Hemoglobin: 16.1 g/dL (ref 13.2–17.1)
Lymphs Abs: 2208 cells/uL (ref 850–3900)
MCH: 29.1 pg (ref 27.0–33.0)
MCHC: 33.4 g/dL (ref 32.0–36.0)
MCV: 87 fL (ref 80.0–100.0)
MPV: 9.5 fL (ref 7.5–12.5)
Monocytes Relative: 11.7 %
Neutro Abs: 969 cells/uL — ABNORMAL LOW (ref 1500–7800)
Neutrophils Relative %: 25.5 %
Platelets: 320 10*3/uL (ref 140–400)
RBC: 5.54 10*6/uL (ref 4.20–5.80)
RDW: 13.7 % (ref 11.0–15.0)
Total Lymphocyte: 58.1 %
WBC: 3.8 10*3/uL (ref 3.8–10.8)

## 2019-08-13 LAB — HIV-1 RNA QUANT-NO REFLEX-BLD
HIV 1 RNA Quant: <20 copies/mL
HIV-1 RNA Quant, Log: <1.3 Log copies/mL

## 2019-08-13 LAB — COMPLETE METABOLIC PANEL WITH GFR
AG Ratio: 1.6 (calc) (ref 1.0–2.5)
ALT: 13 U/L (ref 9–46)
AST: 12 U/L (ref 10–35)
Albumin: 4.1 g/dL (ref 3.6–5.1)
Alkaline phosphatase (APISO): 53 U/L (ref 35–144)
BUN: 11 mg/dL (ref 7–25)
CO2: 28 mmol/L (ref 20–32)
Calcium: 9.4 mg/dL (ref 8.6–10.3)
Chloride: 101 mmol/L (ref 98–110)
Creat: 1.1 mg/dL (ref 0.70–1.33)
GFR, Est African American: 87 mL/min/{1.73_m2} (ref >=60)
GFR, Est Non African American: 75 mL/min/{1.73_m2} (ref >=60)
Globulin: 2.6 g/dL (calc) (ref 1.9–3.7)
Glucose, Bld: 120 mg/dL — ABNORMAL HIGH (ref 65–99)
Potassium: 4.6 mmol/L (ref 3.5–5.3)
Sodium: 137 mmol/L (ref 135–146)
Total Bilirubin: 0.8 mg/dL (ref 0.2–1.2)
Total Protein: 6.7 g/dL (ref 6.1–8.1)

## 2019-08-17 ENCOUNTER — Telehealth: Payer: Self-pay

## 2019-08-17 NOTE — Telephone Encounter (Signed)
Requesting to speak with a nurse about insulin. Please call pt back.  

## 2019-08-17 NOTE — Telephone Encounter (Signed)
Wayne Wolfe reports [facility], but they now say the system shows he has Medicare. He says he does not have insurance. Will need some insulin in a few days. Informed him that Wayne Wolfe was sent to [facility] to replace Lantus since it is not covered under his insurance. He agreed to [facility] with [facility].

## 2019-08-19 ENCOUNTER — Telehealth: Payer: Self-pay

## 2019-08-19 NOTE — Telephone Encounter (Signed)
Requesting to speak with a nurse about something. Please call pt back.  °

## 2019-08-19 NOTE — Telephone Encounter (Signed)
Pt ask that we notify SS disability, [phone] ext [phone], called and the rep stated that pt misunderstood and they would be having a md from [facility] examine pt

## 2019-08-25 ENCOUNTER — Encounter: Payer: Self-pay | Admitting: *Deleted

## 2019-08-26 ENCOUNTER — Ambulatory Visit: Payer: Self-pay

## 2019-08-26 ENCOUNTER — Other Ambulatory Visit: Payer: Self-pay

## 2019-08-29 ENCOUNTER — Other Ambulatory Visit: Payer: Self-pay | Admitting: Infectious Diseases

## 2019-08-29 NOTE — Progress Notes (Signed)
   CC: Diabetes and Hypertension follow up   HPI:  Mr.Kitai A Haslem is a 53 y.o. male living with hiv, htn, diabetes mellitus type 2 who presents for a diabetes follow up. Please see problem based charting for evaluation, assessment, and plan.  Past Medical History:  Diagnosis Date  . Acute respiratory failure with hypoxia ([place]) 01/12/2019  . Dizziness 03/13/2017  . Flu-like symptoms 12/02/2018  . Flu-like symptoms 12/02/2018  . HIV (human immunodeficiency virus infection) ([place]) 2009   11/20/2012 Last CD4 count 210 and VL <20  . 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 Hypertension 2009   Well controlled on HCTZ  . Hypokalemia 01/12/2019  . Lobar pneumonia ([place]) 01/12/2019  . Otitis media 03/13/2017  . Polyuria 03/13/2017  . Poorly controlled diabetes mellitus ([place]) 03/13/2017  . Type 2 diabetes mellitus (HCC)    Well controlled on metformin  . Weight loss 03/13/2017   Review of Systems:  Review of Systems  Constitutional: Negative for chills and fever.  Respiratory: Negative for cough and shortness of breath.   Cardiovascular: Negative for chest pain and palpitations.  Gastrointestinal: Negative for abdominal pain and vomiting.  Neurological: Positive for dizziness. Negative for headaches.     Physical Exam:  Vitals:   09/01/19 1513 09/01/19 1526  BP: (!) 146/126 112/75  Pulse: 97 87  Temp: 98.8 F (37.1 C)   TempSrc: Oral   SpO2: 98%   Weight: 241 lb 1.6 oz (109.4 kg)   Height: 5\' 6"  (1.676 m)    Physical Exam  Constitutional: He is oriented to person, place, and time. He appears well-developed and well-nourished. No distress.  HENT:  Head: Normocephalic and atraumatic.  Eyes: Conjunctivae are normal.  Cardiovascular: Normal rate, regular rhythm and normal heart sounds.  Respiratory: Effort normal and breath sounds normal. No respiratory distress. He has no wheezes.  GI: Soft. Bowel sounds are normal. He exhibits no distension.  Musculoskeletal:      General: No edema.     Comments: Diabetic foot exam: mild amount of calloused feet, dp pulses 2+, intact to vibratory sensation to bilateral ankle.   Neurological: He is alert and oriented to person, place, and time.  Skin: He is not diaphoretic.  Psychiatric: He has a normal mood and affect. His behavior is normal. Judgment and thought content normal.     Assessment & Plan:   See Encounters Tab for problem based charting.  Patient discussed with Dr. Philipp Ovens

## 2019-08-31 ENCOUNTER — Encounter (INDEPENDENT_AMBULATORY_CARE_PROVIDER_SITE_OTHER): Payer: Self-pay | Admitting: *Deleted

## 2019-08-31 ENCOUNTER — Other Ambulatory Visit: Payer: Self-pay

## 2019-08-31 VITALS — BP 116/81 | HR 85 | Temp 98.0°F | Wt 240.2 lb

## 2019-08-31 DIAGNOSIS — Z006 Encounter for examination for normal comparison and control in clinical research program: Secondary | ICD-10-CM

## 2019-08-31 NOTE — Research (Signed)
Wayne Wolfe is here for his week 51 visit for A5322, The HAILO Study. He tested positive for COVID back in April and was hospitalized for a day. States that he is feeling good and denies any residual effects or symptoms for his COVID infection. He is scheduled to see Dr. Tommy Medal in January and will return in February for his next study visit.

## 2019-09-01 ENCOUNTER — Ambulatory Visit: Payer: Self-pay | Admitting: Internal Medicine

## 2019-09-01 ENCOUNTER — Encounter: Payer: Self-pay | Admitting: Internal Medicine

## 2019-09-01 VITALS — BP 112/75 | HR 87 | Temp 98.8°F | Ht 66.0 in | Wt 241.1 lb

## 2019-09-01 DIAGNOSIS — Z794 Long term (current) use of insulin: Secondary | ICD-10-CM

## 2019-09-01 DIAGNOSIS — L84 Corns and callosities: Secondary | ICD-10-CM

## 2019-09-01 DIAGNOSIS — I10 Essential (primary) hypertension: Secondary | ICD-10-CM

## 2019-09-01 DIAGNOSIS — B20 Human immunodeficiency virus [HIV] disease: Secondary | ICD-10-CM

## 2019-09-01 DIAGNOSIS — E1159 Type 2 diabetes mellitus with other circulatory complications: Secondary | ICD-10-CM

## 2019-09-01 DIAGNOSIS — R42 Dizziness and giddiness: Secondary | ICD-10-CM

## 2019-09-01 DIAGNOSIS — Z79899 Other long term (current) drug therapy: Secondary | ICD-10-CM

## 2019-09-01 DIAGNOSIS — E118 Type 2 diabetes mellitus with unspecified complications: Secondary | ICD-10-CM

## 2019-09-01 DIAGNOSIS — I152 Hypertension secondary to endocrine disorders: Secondary | ICD-10-CM

## 2019-09-01 DIAGNOSIS — E1165 Type 2 diabetes mellitus with hyperglycemia: Secondary | ICD-10-CM

## 2019-09-01 LAB — CBC WITH DIFFERENTIAL/PLATELET
Absolute Monocytes: 360 cells/uL (ref 200–950)
Basophils Absolute: 80 cells/uL (ref 0–200)
Basophils Relative: 2 %
Eosinophils Absolute: 112 cells/uL (ref 15–500)
Eosinophils Relative: 2.8 %
HCT: 51.2 % — ABNORMAL HIGH (ref 38.5–50.0)
Hemoglobin: 16.8 g/dL (ref 13.2–17.1)
Lymphs Abs: 2452 cells/uL (ref 850–3900)
MCH: 28.8 pg (ref 27.0–33.0)
MCHC: 32.8 g/dL (ref 32.0–36.0)
MCV: 87.8 fL (ref 80.0–100.0)
MPV: 9.7 fL (ref 7.5–12.5)
Monocytes Relative: 9 %
Neutro Abs: 996 cells/uL — ABNORMAL LOW (ref 1500–7800)
Neutrophils Relative %: 24.9 %
Platelets: 329 10*3/uL (ref 140–400)
RBC: 5.83 10*6/uL — ABNORMAL HIGH (ref 4.20–5.80)
RDW: 14.1 % (ref 11.0–15.0)
Total Lymphocyte: 61.3 %
WBC: 4 10*3/uL (ref 3.8–10.8)

## 2019-09-01 LAB — HEMOGLOBIN A1C
Hgb A1c MFr Bld: 6.5 % of total Hgb — ABNORMAL HIGH (ref <5.7)
Mean Plasma Glucose: 140 (calc)
eAG (mmol/L): 7.7 (calc)

## 2019-09-01 LAB — PROTEIN, URINE, RANDOM: Total Protein, Urine: 7 mg/dL (ref 5–25)

## 2019-09-01 LAB — LIPID PANEL
Cholesterol: 131 mg/dL (ref <200)
HDL: 30 mg/dL — ABNORMAL LOW (ref >=40)
LDL Cholesterol (Calc): 84 mg/dL (calc)
Non-HDL Cholesterol (Calc): 101 mg/dL (calc) (ref <130)
Total CHOL/HDL Ratio: 4.4 (calc) (ref <5.0)
Triglycerides: 76 mg/dL (ref <150)

## 2019-09-01 LAB — COMPREHENSIVE METABOLIC PANEL
AG Ratio: 1.4 (calc) (ref 1.0–2.5)
ALT: 13 U/L (ref 9–46)
AST: 13 U/L (ref 10–35)
Albumin: 4.2 g/dL (ref 3.6–5.1)
Alkaline phosphatase (APISO): 61 U/L (ref 35–144)
BUN: 12 mg/dL (ref 7–25)
CO2: 26 mmol/L (ref 20–32)
Calcium: 9.2 mg/dL (ref 8.6–10.3)
Chloride: 102 mmol/L (ref 98–110)
Creat: 1.16 mg/dL (ref 0.70–1.33)
Globulin: 2.9 g/dL (calc) (ref 1.9–3.7)
Glucose, Bld: 109 mg/dL — ABNORMAL HIGH (ref 65–99)
Potassium: 4.3 mmol/L (ref 3.5–5.3)
Sodium: 138 mmol/L (ref 135–146)
Total Bilirubin: 0.6 mg/dL (ref 0.2–1.2)
Total Protein: 7.1 g/dL (ref 6.1–8.1)

## 2019-09-01 LAB — HEPATITIS C ANTIBODY
Hepatitis C Ab: NONREACTIVE
SIGNAL TO CUT-OFF: 0.03 (ref <1.00)

## 2019-09-01 LAB — CREATININE, URINE, RANDOM: Creatinine, Urine: 99 mg/dL (ref 20–320)

## 2019-09-01 MED ORDER — LISINOPRIL 20 MG PO TABS: 10.0000 mg | ORAL_TABLET | Freq: Every day | ORAL | 1 refills | Status: DC

## 2019-09-01 NOTE — Patient Instructions (Addendum)
It was a pleasure to see you today [name]. Please make the following changes:  -please decrease your insulin degludec from 20units to 17units  -please increase your lisinopril from 10mg  to 20mg   -please call and give an update about your blood glucose readings in 3-4weeks.   If you have any questions or concerns, please call our clinic at [phone] between 9am-5pm and after hours call [phone] and ask for the internal medicine resident on call. If you feel you are having a medical emergency please call 911.   Thank you, we look forward to help you remain healthy!  Lars Mage, MD Internal Medicine PGY3

## 2019-09-03 ENCOUNTER — Ambulatory Visit: Payer: Self-pay

## 2019-09-03 NOTE — Assessment & Plan Note (Signed)
" >>  ASSESSMENT AND PLAN FOR HUMAN IMMUNODEFICIENCY VIRUS (HIV) DISEASE (HCC) WRITTEN ON 09/03/2019  9:46 AM BY Oswald Pott, MD  Patient has been adherent with Odefsey  daily.   -continue following with Infectious Disease "

## 2019-09-03 NOTE — Assessment & Plan Note (Signed)
The patient's blood pressure during this visit was 146/126 and repeat 112/75. The patient is currently taking lisinopril 10mg  qd. His last blood pressure visits are   BP Readings from Last 3 Encounters:  09/01/19 112/75  08/31/19 116/81  06/30/19 123/86   Assessment and plan  Due to the patient's blood pressure not being controlled will increase lisinopril from 10mg  to 20mg .

## 2019-09-03 NOTE — Assessment & Plan Note (Addendum)
The patient's last a1c=6.5 on 08/31/19. The patient's home blood glucose measurements over the past month have ranged 80-100s. The patient has had occasional hypoglycemic episodes.   The patient is currently taking metformin 1000mg  bid and liraglutide 1.2mg  qd, and insulin degludec 20u qd. The patient is compliant with medication.    Patient had a 2lbs weight gain.  Assessment and plan  Due to the patient having low blood glucose readings will decrease insulin. Continue metformin and liraglutide.   -decreased degludec to 17u from 20u

## 2019-09-03 NOTE — Assessment & Plan Note (Signed)
Patient has been adherent with Odefsey daily.   -continue following with Infectious Disease

## 2019-09-08 NOTE — Progress Notes (Addendum)
Internal Medicine Clinic Attending  Case discussed with Dr. Chundi at the time of the visit.  We reviewed the resident's history and exam and pertinent patient test results.  I agree with the assessment, diagnosis, and plan of care documented in the resident's note. 

## 2019-09-10 ENCOUNTER — Ambulatory Visit: Payer: Self-pay

## 2019-09-10 ENCOUNTER — Other Ambulatory Visit: Payer: Self-pay

## 2019-09-12 ENCOUNTER — Other Ambulatory Visit: Payer: Self-pay | Admitting: Family

## 2019-09-12 DIAGNOSIS — J301 Allergic rhinitis due to pollen: Secondary | ICD-10-CM

## 2019-09-17 ENCOUNTER — Other Ambulatory Visit: Payer: Self-pay | Admitting: Internal Medicine

## 2019-10-01 ENCOUNTER — Ambulatory Visit: Payer: MEDICAID | Attending: Internal Medicine

## 2019-10-01 DIAGNOSIS — Z20822 Contact with and (suspected) exposure to covid-19: Secondary | ICD-10-CM

## 2019-10-03 LAB — NOVEL CORONAVIRUS, NAA: SARS-CoV-2, NAA: NOT DETECTED

## 2019-10-07 ENCOUNTER — Ambulatory Visit: Payer: Self-pay

## 2019-10-07 ENCOUNTER — Other Ambulatory Visit: Payer: Self-pay

## 2019-10-07 ENCOUNTER — Encounter: Payer: Self-pay | Admitting: Infectious Disease

## 2019-10-07 ENCOUNTER — Ambulatory Visit (INDEPENDENT_AMBULATORY_CARE_PROVIDER_SITE_OTHER): Payer: Self-pay | Admitting: Infectious Disease

## 2019-10-07 VITALS — BP 128/87 | HR 82 | Temp 97.9°F | Ht 66.0 in | Wt 240.0 lb

## 2019-10-07 DIAGNOSIS — I10 Essential (primary) hypertension: Secondary | ICD-10-CM

## 2019-10-07 DIAGNOSIS — B20 Human immunodeficiency virus [HIV] disease: Secondary | ICD-10-CM

## 2019-10-07 DIAGNOSIS — E1159 Type 2 diabetes mellitus with other circulatory complications: Secondary | ICD-10-CM

## 2019-10-07 DIAGNOSIS — I152 Hypertension secondary to endocrine disorders: Secondary | ICD-10-CM

## 2019-10-07 DIAGNOSIS — E1165 Type 2 diabetes mellitus with hyperglycemia: Secondary | ICD-10-CM

## 2019-10-07 DIAGNOSIS — Z8616 Personal history of COVID-19: Secondary | ICD-10-CM

## 2019-10-07 NOTE — Progress Notes (Signed)
Chief complaint: Follow-up for HIV disease  Subjective:    Patient ID: Wayne Wolfe, male    DOB: 01/29/1966, 53 y.o.   MRN: [id number]  HIV Positive/AIDS   53 year old man previously perfectly suppressed on Prezista Norvir and Truvada whom I changed over to Atripla and then to Complera to ODEFSEY, excellent virological control.  Wayne Wolfe is in good spirits today.  He is being followed by Dr. Chundli and has obtained ACA using [facility] (which he will renew today)  Is scheduled for colonoscopy.  He did as mentioned succumbed to coronavirus infection last spring when he was hospitalized symptoms included loss of taste and smell dyspnea cough and severe diarrhea.  He had a Covid test again the end of December but this was done just for screening purposes as it was not himself symptomatic.      Past Medical History:  Diagnosis Date  . Acute respiratory failure with hypoxia (HCC) 01/12/2019  . Dizziness 03/13/2017  . Flu-like symptoms 12/02/2018  . Flu-like symptoms 12/02/2018  . HIV (human immunodeficiency virus infection) (HCC) 2009   11/20/2012 Last CD4 count 210 and VL <20  . 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 Hypertension 2009   Well controlled on HCTZ  . Hypokalemia 01/12/2019  . Lobar pneumonia (HCC) 01/12/2019  . Otitis media 03/13/2017  . Polyuria 03/13/2017  . Poorly controlled diabetes mellitus (HCC) 03/13/2017  . Type 2 diabetes mellitus (HCC)    Well controlled on metformin  . Weight loss 03/13/2017    Past Surgical History:  Procedure Laterality Date  . CARPAL TUNNEL RELEASE Right 1990s  . none      Family History  Problem Relation Age of Onset  . Diabetes Mellitus II Sister   . Diabetes Mellitus II Mother   . Coronary artery disease Mother 66       CABGx6  . Coronary artery disease Sister 63       CABG  . Diabetes Mellitus II Sister   . Coronary artery disease Sister   . Diabetes Mellitus II Sister       Social History   Socioeconomic  History  . Marital status: Single    Spouse name: Not on file  . Number of children: Not on file  . Years of education: 13  . Highest education level: Not on file  Occupational History    Employer: UNEMPLOYED  Tobacco Use  . Smoking status: Former Smoker  . Smokeless tobacco: Never Used  Substance and Sexual Activity  . Alcohol use: No    Alcohol/week: 0.0 standard drinks    Comment: Last used 2004  . Drug use: No    Comment: Last used 2003  . Sexual activity: Yes    Partners: Male  Other Topics Concern  . Not on file  Social History Narrative   On disability. Lives in [place].    Social Determinants of Health   Financial Resource Strain:   . Difficulty of Paying Living Expenses: Not on file  Food Insecurity:   . Worried About Running Out of Food in the Last Year: Not on file  . Ran Out of Food in the Last Year: Not on file  Transportation Needs:   . Lack of Transportation (Medical): Not on file  . Lack of Transportation (Non-Medical): Not on file  Physical Activity:   . Days of Exercise per Week: Not on file  . Minutes of Exercise per Session: Not on file  Stress:   . Feeling   of Stress : Not on file  Social Connections:   . Frequency of Communication with Friends and Family: Not on file  . Frequency of Social Gatherings with Friends and Family: Not on file  . Attends Religious Services: Not on file  . Active Member of Clubs or Organizations: Not on file  . Attends Club or Organization Meetings: Not on file  . Marital Status: Not on file    No Known Allergies   Current Outpatient Medications:  .  ACCU-CHEK FASTCLIX LANCETS MISC, Check blood sugar before meals and after meals, Disp: 102 each, Rfl: 12 .  acetaminophen (TYLENOL) 500 MG tablet, Take 1,000 mg by mouth every 6 (six) hours as needed for fever., Disp: , Rfl:  .  Blood Glucose Monitoring Suppl (ACCU-CHEK GUIDE) w/Device KIT, 1 each by Does not apply route 6 (six) times daily., Disp: 1 kit, Rfl: 0 .   emtricitabine-rilpivir-tenofovir AF (ODEFSEY) 200-25-25 MG TABS tablet, Take 1 tablet by mouth daily with breakfast., Disp: 30 tablet, Rfl: 11 .  fexofenadine (ALLEGRA) 180 MG tablet, TAKE 1 TABLET(180 MG) BY MOUTH DAILY, Disp: 30 tablet, Rfl: 5 .  glucose blood (ACCU-CHEK GUIDE) test strip, Check blood sugar before meals and after meals, Disp: 100 each, Rfl: 12 .  hydrochlorothiazide (HYDRODIURIL) 25 MG tablet, TAKE 1 TABLET(25 MG) BY MOUTH DAILY, Disp: 30 tablet, Rfl: 5 .  Insulin Pen Needle (B-D UF III MINI PEN NEEDLES) 31G X 5 MM MISC, Use 2 times daily to inject insulin into the skin. diag code E11.65. insulin dependent, Disp: 190 each, Rfl: 1 .  liraglutide (VICTOZA) 18 MG/3ML SOPN, ADMINISTER 1.2 MG UNDER THE SKIN DAILY, Disp: 6 pen, Rfl: 1 .  lisinopril (ZESTRIL) 20 MG tablet, Take 0.5 tablets (10 mg total) by mouth daily., Disp: 90 tablet, Rfl: 1 .  metFORMIN (GLUCOPHAGE-XR) 500 MG 24 hr tablet, Take 2 tablets (1,000 mg total) by mouth 2 (two) times daily., Disp: 360 tablet, Rfl: 1 .  pravastatin (PRAVACHOL) 40 MG tablet, TAKE 1 TABLET BY MOUTH DAILY, Disp: 90 tablet, Rfl: 1 .  TRESIBA FLEXTOUCH 100 UNIT/ML SOPN FlexTouch Pen, INJECT 20 UNITS INTO THE SKIN DAILY, Disp: 9 mL, Rfl: 1    Review of Systems  Constitutional: Negative for activity change, appetite change, chills, diaphoresis, fatigue and fever.  HENT: Negative for congestion, rhinorrhea, sinus pressure, sinus pain, sneezing, sore throat and trouble swallowing.   Eyes: Negative for photophobia and visual disturbance.  Respiratory: Negative for cough, chest tightness, shortness of breath, wheezing and stridor.   Cardiovascular: Negative for chest pain, palpitations and leg swelling.  Gastrointestinal: Negative for abdominal distention, abdominal pain, anal bleeding, blood in stool, constipation, diarrhea, nausea and vomiting.  Genitourinary: Negative for difficulty urinating, dysuria, flank pain and hematuria.  Musculoskeletal:  Negative for arthralgias, back pain, gait problem, joint swelling and myalgias.  Skin: Negative for color change, pallor, rash and wound.  Neurological: Negative for tremors, weakness and light-headedness.  Hematological: Negative for adenopathy. Does not bruise/bleed easily.  Psychiatric/Behavioral: Negative for agitation, behavioral problems, confusion, dysphoric mood and sleep disturbance.       Objective:   Physical Exam Constitutional:      General: He is not in acute distress.    Appearance: He is well-developed. He is not diaphoretic.  HENT:     Head: Normocephalic and atraumatic.     Right Ear: Swelling present. A middle ear effusion is present. Tympanic membrane is erythematous.     Left Ear: A middle ear effusion is   present.     Nose: No rhinorrhea.     Mouth/Throat:     Pharynx: No oropharyngeal exudate.  Eyes:     General: No scleral icterus.    Extraocular Movements: Extraocular movements intact.     Conjunctiva/sclera: Conjunctivae normal.     Pupils: Pupils are equal, round, and reactive to light.  Neck:     Vascular: No JVD.  Cardiovascular:     Rate and Rhythm: Normal rate.     Heart sounds: Normal heart sounds. No murmur. No friction rub. No gallop.   Pulmonary:     Effort: Pulmonary effort is normal. No respiratory distress.     Breath sounds: Normal breath sounds. No stridor. No wheezing, rhonchi or rales.  Chest:     Chest wall: No tenderness.  Abdominal:     General: Bowel sounds are normal. There is no distension.  Musculoskeletal:        General: No tenderness.     Cervical back: Normal range of motion and neck supple.  Lymphadenopathy:     Cervical: No cervical adenopathy.  Skin:    General: Skin is warm and dry.     Coloration: Skin is not pale.     Findings: No erythema.  Neurological:     Mental Status: He is alert and oriented to person, place, and time.     Motor: No abnormal muscle tone.     Coordination: Coordination normal.   Psychiatric:        Mood and Affect: Mood normal.        Behavior: Behavior normal.        Thought Content: Thought content normal.        Judgment: Judgment normal.           Assessment & Plan:   HIV: continue ODEFSEY he is renewed his HMA P we can do this again in July  DM: following with Dr Bailey Mech  HTN: Defer to primary care : There were no vitals filed for this visit.  Hyperlipidemia: at goal  Lipid Panel     Component Value Date/Time   CHOL 131 08/31/2019 0915   TRIG 76 08/31/2019 0915   HDL 30 (L) 08/31/2019 0915   CHOLHDL 4.4 08/31/2019 0915   VLDL 18 01/13/2019 0442   LDLCALC 84 08/31/2019 0915

## 2019-10-08 ENCOUNTER — Encounter: Payer: Self-pay | Admitting: Infectious Disease

## 2019-10-26 ENCOUNTER — Other Ambulatory Visit: Payer: Self-pay | Admitting: Internal Medicine

## 2019-10-26 DIAGNOSIS — I152 Hypertension secondary to endocrine disorders: Secondary | ICD-10-CM

## 2019-10-26 DIAGNOSIS — E1159 Type 2 diabetes mellitus with other circulatory complications: Secondary | ICD-10-CM

## 2019-10-29 ENCOUNTER — Other Ambulatory Visit: Payer: Self-pay | Admitting: Internal Medicine

## 2019-10-29 NOTE — Telephone Encounter (Signed)
What is this regarding?

## 2019-10-29 NOTE — Telephone Encounter (Signed)
Need refill on TRESIBA FLEXTOUCH 100 UNIT/ML SOPN FlexTouch Pen  ;pt contact [place] [place] [facility] [place]

## 2019-10-29 NOTE — Telephone Encounter (Signed)
Pt had called for refill of tresiba, called pharmacy he has refills available

## 2019-11-03 ENCOUNTER — Telehealth: Payer: Self-pay | Admitting: Internal Medicine

## 2019-11-03 NOTE — Telephone Encounter (Signed)
Thank you :)

## 2019-11-03 NOTE — Telephone Encounter (Signed)
PA requested

## 2019-11-03 NOTE — Telephone Encounter (Signed)
Per pharmacy Levemir or basaglar are preferred basal insulins. We can try requesting  a PA for [place].  Per patient, he is not having lows now, he is out of [place] and has been for a couple of days. He feels that either one of the other insulins are okay also. He will come pick up samples of [place] today.

## 2019-11-03 NOTE — Telephone Encounter (Signed)
Pt is unable to get the following as his insurance will not approve it.  Pt requesting a call back.  TRESIBA FLEXTOUCH 100 UNIT/ML SOPN FlexTouch Pen  [id number] - [place], [place] - [address]

## 2019-11-10 NOTE — Telephone Encounter (Signed)
Gladys, I'm not sure if a we still need a PA. Thanks

## 2019-11-26 ENCOUNTER — Other Ambulatory Visit: Payer: Self-pay

## 2019-11-26 ENCOUNTER — Ambulatory Visit: Payer: PRIVATE HEALTH INSURANCE

## 2019-11-30 ENCOUNTER — Telehealth: Payer: Self-pay

## 2019-11-30 NOTE — Telephone Encounter (Signed)
Patient called to request appointment for STI screening.  Patient was informed he would need to go to the health department or urgent care for STI screening.    Laverle Patter, RN

## 2019-12-01 ENCOUNTER — Other Ambulatory Visit (HOSPITAL_COMMUNITY)
Admission: RE | Admit: 2019-12-01 | Discharge: 2019-12-01 | Disposition: A | Discharge status: Home / Self Care | Payer: Medicaid Other | Source: Ambulatory Visit | Attending: Internal Medicine | Admitting: Internal Medicine

## 2019-12-01 ENCOUNTER — Ambulatory Visit (INDEPENDENT_AMBULATORY_CARE_PROVIDER_SITE_OTHER): Payer: PRIVATE HEALTH INSURANCE | Admitting: Internal Medicine

## 2019-12-01 ENCOUNTER — Encounter: Payer: Self-pay | Admitting: Internal Medicine

## 2019-12-01 ENCOUNTER — Other Ambulatory Visit: Payer: Self-pay

## 2019-12-01 VITALS — BP 147/96 | HR 89 | Temp 98.4°F | Wt 246.5 lb

## 2019-12-01 DIAGNOSIS — I152 Hypertension secondary to endocrine disorders: Secondary | ICD-10-CM

## 2019-12-01 DIAGNOSIS — E1165 Type 2 diabetes mellitus with hyperglycemia: Secondary | ICD-10-CM

## 2019-12-01 DIAGNOSIS — A64 Unspecified sexually transmitted disease: Secondary | ICD-10-CM

## 2019-12-01 DIAGNOSIS — Z7252 High risk homosexual behavior: Secondary | ICD-10-CM

## 2019-12-01 DIAGNOSIS — Z7251 High risk heterosexual behavior: Secondary | ICD-10-CM | POA: Insufficient documentation

## 2019-12-01 DIAGNOSIS — Z1211 Encounter for screening for malignant neoplasm of colon: Secondary | ICD-10-CM

## 2019-12-01 DIAGNOSIS — Z794 Long term (current) use of insulin: Secondary | ICD-10-CM

## 2019-12-01 DIAGNOSIS — Z21 Asymptomatic human immunodeficiency virus [HIV] infection status: Secondary | ICD-10-CM

## 2019-12-01 DIAGNOSIS — E118 Type 2 diabetes mellitus with unspecified complications: Secondary | ICD-10-CM

## 2019-12-01 DIAGNOSIS — B20 Human immunodeficiency virus [HIV] disease: Secondary | ICD-10-CM

## 2019-12-01 DIAGNOSIS — Z0000 Encounter for general adult medical examination without abnormal findings: Secondary | ICD-10-CM

## 2019-12-01 DIAGNOSIS — E1159 Type 2 diabetes mellitus with other circulatory complications: Secondary | ICD-10-CM

## 2019-12-01 DIAGNOSIS — I10 Essential (primary) hypertension: Secondary | ICD-10-CM

## 2019-12-01 LAB — GLUCOSE, CAPILLARY: Glucose-Capillary: 141 mg/dL — ABNORMAL HIGH (ref 70–99)

## 2019-12-01 LAB — POCT GLYCOSYLATED HEMOGLOBIN (HGB A1C): Hemoglobin A1C: 6.5 % — AB (ref 4.0–5.6)

## 2019-12-01 MED ORDER — LANTUS SOLOSTAR 100 UNIT/ML ~~LOC~~ SOPN: 20.0000 [IU] | PEN_INJECTOR | Freq: Every day | SUBCUTANEOUS | 2 refills | Status: DC

## 2019-12-01 NOTE — Assessment & Plan Note (Signed)
  The patient stated that he has been sexually active with one partner. Two weeks ago he had anal and oral intercourse with a male partner during which time condom broke. The patient requested that he be tested for STI. He has been asymptomatic.   Assessment and plan  Will order oral, anal, and urinary gonorrhea, chlamydia, trichomonas. RPR.

## 2019-12-01 NOTE — Assessment & Plan Note (Signed)
The patient's blood pressure during this visit was 134/96. The patient is currently taking lisinopril 20mg  qd. His last blood pressure visits are   BP Readings from Last 3 Encounters:  12/01/19 (!) 147/96  10/07/19 128/87  09/01/19 112/75   Assessment and Plan Continue current medication regimen. His last creatinine was 1.16 in November 2020.

## 2019-12-01 NOTE — Assessment & Plan Note (Signed)
Ordered colonoscopy

## 2019-12-01 NOTE — Patient Instructions (Signed)
It was a pleasure to see you today [name]. Please make the following changes:  -please switch tresiba to lantus 20u daily  -please continue with all your other medications as listed on this form  -I will call you about the results of your tests -I have ordered a colonoscopy for you  If you have any questions or concerns, please call our clinic at [phone] between 9am-5pm and after hours call [phone] and ask for the internal medicine resident on call. If you feel you are having a medical emergency please call 911.   Thank you, we look forward to help you remain healthy!  Wayne Mage, MD Internal Medicine PGY3

## 2019-12-01 NOTE — Assessment & Plan Note (Addendum)
The patient's a1c during this visit was 6.5. He is currently taking metformin 1000mg  bid, liraglutide 1.2mg  qd and tresiba 20u. The patient is adherent with medication. He has gained approximately 6lbs since his last visit in January 2021.  Assessment and plan  Patient has not been able to afford tresiba so will switch to lantus 20u qd. Continue metformin and liraglutide.

## 2019-12-01 NOTE — Progress Notes (Signed)
   CC: Diabetes follow up  HPI:  [name] is a 54 y.o. male living with HIV, htn, diabetes type 2 who presents for diabetes follow up. Please see problem based charting for evaluation, assessment, and plan.  Past Medical History:  Diagnosis Date  . Acute respiratory failure with hypoxia ([place]) 01/12/2019  . Dizziness 03/13/2017  . Flu-like symptoms 12/02/2018  . Flu-like symptoms 12/02/2018  . HIV (human immunodeficiency virus infection) ([place]) 2009   11/20/2012 Last CD4 count 210 and VL <20  . HIV infection with neurological disease ([place]) 10/04/2016  . Hypertension 2009   Well controlled on HCTZ  . Hypokalemia 01/12/2019  . Lobar pneumonia ([place]) 01/12/2019  . Otitis media 03/13/2017  . Polyuria 03/13/2017  . Poorly controlled diabetes mellitus ([place]) 03/13/2017  . Type 2 diabetes mellitus (HCC)    Well controlled on metformin  . Weight loss 03/13/2017   Review of Systems:    Denies chest pain, dyspnea, fever, chills  Physical Exam:  Vitals:   12/01/19 1531 12/01/19 1613  BP: (!) 134/96 (!) 147/96  Pulse: 98 89  Temp: 98.4 F (36.9 C)   TempSrc: Oral   SpO2: 98%   Weight: 246 lb 8 oz (111.8 kg)    Physical Exam  Constitutional: He is oriented to person, place, and time. He appears well-developed and well-nourished. No distress.  HENT:  Head: Normocephalic and atraumatic.  Eyes: Conjunctivae are normal.  Cardiovascular: Normal rate, regular rhythm and normal heart sounds.  Respiratory: Effort normal and breath sounds normal. No respiratory distress. He has no wheezes.  GI: Soft. Bowel sounds are normal. He exhibits no distension. There is no abdominal tenderness.  Genitourinary:    Penis and rectum normal.  No penile erythema or penile tenderness. No discharge found.  Neurological: He is alert and oriented to person, place, and time.  Skin: He is not diaphoretic.  Psychiatric: He has a normal mood and affect. His behavior is normal. Judgment and thought content normal.    Assessment & Plan:   See Encounters Tab for problem based charting.  Patient discussed with Dr. Daryll Drown

## 2019-12-02 ENCOUNTER — Other Ambulatory Visit (INDEPENDENT_AMBULATORY_CARE_PROVIDER_SITE_OTHER): Payer: Self-pay

## 2019-12-02 DIAGNOSIS — E1165 Type 2 diabetes mellitus with hyperglycemia: Secondary | ICD-10-CM

## 2019-12-02 LAB — URINE CYTOLOGY ANCILLARY ONLY
Chlamydia: NEGATIVE
Comment: NEGATIVE
Comment: NEGATIVE
Comment: NORMAL
Neisseria Gonorrhea: NEGATIVE
Trichomonas: NEGATIVE

## 2019-12-02 LAB — CYTOLOGY, (ORAL, ANAL, URETHRAL) ANCILLARY ONLY
Chlamydia: NEGATIVE
Chlamydia: NEGATIVE
Comment: NEGATIVE
Comment: NEGATIVE
Comment: NEGATIVE
Comment: NEGATIVE
Comment: NORMAL
Comment: NORMAL
Neisseria Gonorrhea: NEGATIVE
Neisseria Gonorrhea: NEGATIVE
Trichomonas: NEGATIVE
Trichomonas: NEGATIVE

## 2019-12-03 ENCOUNTER — Telehealth: Payer: Self-pay | Admitting: *Deleted

## 2019-12-03 NOTE — Telephone Encounter (Signed)
Please note labs from 3/2 rpr abnormal

## 2019-12-04 LAB — RPR, QUANT+TP ABS (REFLEX)
Rapid Plasma Reagin, Quant: 1:32 {titer} — ABNORMAL HIGH
T Pallidum Abs: REACTIVE — AB

## 2019-12-04 LAB — RPR: RPR Ser Ql: REACTIVE — AB

## 2019-12-09 NOTE — Telephone Encounter (Signed)
Pt rtn call requesting a call back .

## 2019-12-10 ENCOUNTER — Ambulatory Visit: Payer: PRIVATE HEALTH INSURANCE

## 2019-12-11 ENCOUNTER — Other Ambulatory Visit: Payer: Self-pay | Admitting: Internal Medicine

## 2019-12-11 DIAGNOSIS — E1165 Type 2 diabetes mellitus with hyperglycemia: Secondary | ICD-10-CM

## 2019-12-14 ENCOUNTER — Ambulatory Visit (INDEPENDENT_AMBULATORY_CARE_PROVIDER_SITE_OTHER): Payer: Self-pay | Admitting: Internal Medicine

## 2019-12-14 ENCOUNTER — Telehealth: Payer: Self-pay | Admitting: Internal Medicine

## 2019-12-14 VITALS — BP 126/76 | HR 82 | Temp 98.7°F | Ht 67.0 in | Wt 245.2 lb

## 2019-12-14 DIAGNOSIS — A539 Syphilis, unspecified: Secondary | ICD-10-CM | POA: Insufficient documentation

## 2019-12-14 MED ORDER — PENICILLIN G BENZATHINE 2400000 UNIT/4ML IM SUSP: 2.4000 10*6.[IU] | Freq: Once | INTRAMUSCULAR | Status: DC

## 2019-12-14 MED ORDER — PENICILLIN G BENZATHINE 1200000 UNIT/2ML IM SUSP
1.2000 10*6.[IU] | Freq: Once | INTRAMUSCULAR | Status: AC
  Administered 2019-12-14: 1.2 10*6.[IU] via INTRAMUSCULAR

## 2019-12-14 NOTE — Telephone Encounter (Signed)
I will add Dr. Evette Doffing and Dr. Rebeca Alert to this message as they will be in clinic this afternoon when he is scheduled.

## 2019-12-14 NOTE — Assessment & Plan Note (Signed)
Syphilis: Asymptomatic syphilis with RPR quant 1:32 and T Pallidum Abs reactive. Prior Syphilis testing at the [facility] department reported to be negative by Dr. Maricela Bo in her note from 12/14/19 at 9:17am.  STD screening occurred after unprotected encounter. Patient again denied symptoms today.  He denied a know allergic reaction to penicillin or related cephalosporins.  Patient tolerated the injection well at time 93min and 6min. No dyspnea, chest tightness, rash, vision changes noted.  Plan: Agree with IM Penicinllin 2.4Million Units, will need to be administered in two doses Repeat RPR titier looking for a fourfold decrease in 6 months, ~ August 2021

## 2019-12-14 NOTE — Progress Notes (Signed)
   CC: syphilis diagnosis   HPI:[name] is a 54 y.o. male who presents for evaluation of syphilis and treatment of same. Please see individual problem based A/P for details.  Past Medical History:  Diagnosis Date  . Acute respiratory failure with hypoxia ([place]) 01/12/2019  . Dizziness 03/13/2017  . Flu-like symptoms 12/02/2018  . Flu-like symptoms 12/02/2018  . HIV (human immunodeficiency virus infection) ([place]) 2009   11/20/2012 Last CD4 count 210 and VL <20  . HIV infection with neurological disease ([place]) 10/04/2016  . Hypertension 2009   Well controlled on HCTZ  . Hypokalemia 01/12/2019  . Lobar pneumonia ([place]) 01/12/2019  . Otitis media 03/13/2017  . Polyuria 03/13/2017  . Poorly controlled diabetes mellitus ([place]) 03/13/2017  . Type 2 diabetes mellitus (HCC)    Well controlled on metformin  . Weight loss 03/13/2017   Review of Systems:  ROS negative except as per HPI.  Physical Exam: Vitals:   12/14/19 1352  BP: 126/76  Pulse: 82  Temp: 98.7 F (37.1 C)  TempSrc: Oral  SpO2: 99%  Weight: 245 lb 3.2 oz (111.2 kg)  Height: 5\' 7"  (1.702 m)   Filed Weights   12/14/19 1352  Weight: 245 lb 3.2 oz (111.2 kg)   General: A/O x4, in no acute distress, afebrile, nondiaphoretic HEENT: PEERL, EMO intact Cardio: RRR, no mrg's  Pulmonary: CTA bilaterally, no wheezing or crackles  Neuro: Alert, CNII-XII grossly intact, conversational, strength 5/5 in the upper and lower extremities bilaterally, normal gait Psych: Appropriate affect, not depressed in appearance, engages well  Assessment & Plan:   See Encounters Tab for problem based charting.  Patient discussed with Dr. Rebeca Alert

## 2019-12-14 NOTE — Patient Instructions (Signed)
FOLLOW-UP INSTRUCTIONS When: In August for repeat RPR testing and sooner with your PCP for a routine visit as needed What to bring: All of your medications  As always if your symptoms worsen, fail to improve, or you develop other concerning symptoms, please notify our office or visit the local ER if we are unavailable. Symptoms including shortness of breath, weakness, chest tightness, rash, should not be ignored and should encourage you to visit the ED if we are unavailable by phone or the symptoms are severe.  Thank you for your visit to the [facility] today. If you have any questions or concerns please call us at [phone].

## 2019-12-14 NOTE — Telephone Encounter (Signed)
Patient tested positive previously and records were requested from the public health dept. Did we get those yet?

## 2019-12-14 NOTE — Telephone Encounter (Signed)
Able to reach health department. According to health department the patient's last syphilis test was in August 2020 at which time he tested negative.   Therefore, the patient fits the early syphilis criteria. Patient scheduled for [facility] visit 3/15 to get  RPR titer just prior to initiating therapy, one dose of IM Benzathine Penicillin G 2.81million units. He should be monitored for fourfold decrease in rpr in 6 months (August 2021).  Patient's DIS is Vertis Kelch who will coordinate other care related to testing partner.   Lars Mage, MD Internal Medicine PGY3 Pager:[phone]

## 2019-12-15 ENCOUNTER — Telehealth: Payer: Self-pay | Admitting: *Deleted

## 2019-12-15 NOTE — Telephone Encounter (Signed)
-----   Message from Lars Mage, MD sent at 12/14/2019  8:59 AM EDT ----- Hello Everyone,   Hope you are doing well! I am emailing to follow  up regarding the above patient's rpr fta abs results. The patient tested positive at his last office visit, but when I called him he stated that he tested positive prior. I requested for the prior results to be sent to the office. Can we please verify that we received this or re-request so we can figure out if he needs to be treated. Thank you!  Best,   [employer]

## 2019-12-15 NOTE — Telephone Encounter (Signed)
Spoke with health dept-states pt had a "non-reactive" test on 07/16/2018 that was performed at [facility]. Also stated that pt was positive for other STIs. CMA will fax release of info to obtain all STI results.  Will also forward info to pcp.Despina Hidden Cassady3/16/20219:33 AM

## 2019-12-15 NOTE — Telephone Encounter (Signed)
Was able to reach the health dept yesterday 3/15. Patient's last rpr was August 2020.Called patient and patient was given treatment yesterday in clinic. Thank you!

## 2019-12-15 NOTE — Progress Notes (Signed)
Internal Medicine Clinic Attending  Case discussed with Dr. Chundi soon after the resident saw the patient.  We reviewed the resident's history and exam and pertinent patient test results.  I agree with the assessment, diagnosis, and plan of care documented in the resident's note.    

## 2019-12-16 NOTE — Telephone Encounter (Signed)
Please see telephone note from 12/15/19 which addresses this encounter. This encounter will be closed. SChaplin, RN,BSN

## 2019-12-23 ENCOUNTER — Encounter: Payer: Self-pay | Admitting: Gastroenterology

## 2019-12-24 ENCOUNTER — Ambulatory Visit: Payer: Medicaid Other

## 2020-01-05 NOTE — Progress Notes (Signed)
Internal Medicine Clinic Attending  Case discussed with Dr. Harbrecht at the time of the visit.  We reviewed the resident's history and exam and pertinent patient test results.  I agree with the assessment, diagnosis, and plan of care documented in the resident's note.  Daegen Berrocal, M.D., Ph.D.  

## 2020-01-07 ENCOUNTER — Other Ambulatory Visit: Payer: Self-pay | Admitting: Internal Medicine

## 2020-01-07 DIAGNOSIS — E1165 Type 2 diabetes mellitus with hyperglycemia: Secondary | ICD-10-CM

## 2020-01-12 ENCOUNTER — Ambulatory Visit (AMBULATORY_SURGERY_CENTER): Payer: Self-pay

## 2020-01-12 ENCOUNTER — Other Ambulatory Visit: Payer: Self-pay

## 2020-01-12 VITALS — Temp 96.6°F | Ht 67.0 in | Wt 248.0 lb

## 2020-01-12 DIAGNOSIS — Z1211 Encounter for screening for malignant neoplasm of colon: Secondary | ICD-10-CM

## 2020-01-12 MED ORDER — NA SULFATE-K SULFATE-MG SULF 17.5-3.13-1.6 GM/177ML PO SOLN: 1.0000 | Freq: Once | ORAL | 0 refills | Status: AC

## 2020-01-12 NOTE — Progress Notes (Signed)
No egg or soy allergy known to patient  No issues with past sedation with any surgeries  or procedures, no intubation problems  No diet pills per patient No home 02 use per patient  No blood thinners per patient  Pt denies issues with constipation  No A fib or A flutter  EMMI video sent to pt's e mail   PT [place] 01/06/20  suprep coupon and code used.  Due to the COVID-19 pandemic we are asking patients to follow these guidelines. Please only bring one care partner. Please be aware that your care partner may wait in the car in the parking lot or if they feel like they will be too hot to wait in the car, they may wait in the lobby on the 4th floor. All care partners are required to wear a mask the entire time (we do not have any that we can provide them), they need to practice social distancing, and we will do a Covid check for all patient's and care partners when you arrive. Also we will check their temperature and your temperature. If the care partner waits in their car they need to stay in the parking lot the entire time and we will call them on their cell phone when the patient is ready for discharge so they can bring the car to the front of the building. Also all patient's will need to wear a mask into building.

## 2020-01-14 ENCOUNTER — Ambulatory Visit: Payer: Medicaid Other

## 2020-01-20 ENCOUNTER — Other Ambulatory Visit: Payer: Self-pay | Admitting: Infectious Disease

## 2020-01-20 DIAGNOSIS — E1169 Type 2 diabetes mellitus with other specified complication: Secondary | ICD-10-CM

## 2020-01-20 DIAGNOSIS — E785 Hyperlipidemia, unspecified: Secondary | ICD-10-CM

## 2020-01-26 ENCOUNTER — Other Ambulatory Visit: Payer: Self-pay

## 2020-01-26 ENCOUNTER — Ambulatory Visit (AMBULATORY_SURGERY_CENTER): Payer: PRIVATE HEALTH INSURANCE | Admitting: Gastroenterology

## 2020-01-26 ENCOUNTER — Encounter: Payer: Self-pay | Admitting: Gastroenterology

## 2020-01-26 VITALS — BP 128/87 | HR 87 | Temp 96.6°F | Resp 13 | Ht 67.0 in | Wt 248.0 lb

## 2020-01-26 DIAGNOSIS — K6282 Dysplasia of anus: Secondary | ICD-10-CM | POA: Diagnosis not present

## 2020-01-26 DIAGNOSIS — Z1211 Encounter for screening for malignant neoplasm of colon: Secondary | ICD-10-CM | POA: Diagnosis not present

## 2020-01-26 DIAGNOSIS — D129 Benign neoplasm of anus and anal canal: Secondary | ICD-10-CM | POA: Diagnosis not present

## 2020-01-26 DIAGNOSIS — K6289 Other specified diseases of anus and rectum: Secondary | ICD-10-CM | POA: Diagnosis not present

## 2020-01-26 MED ORDER — SODIUM CHLORIDE 0.9 % IV SOLN
500.0000 mL | Freq: Once | INTRAVENOUS | Status: DC
Start: 2020-01-26 — End: 2020-01-26

## 2020-01-26 NOTE — Progress Notes (Signed)
Called to room to assist during endoscopic procedure.  Patient ID and intended procedure confirmed with present staff. Received instructions for my participation in the procedure from the performing physician.  

## 2020-01-26 NOTE — Progress Notes (Signed)
Pt's states no medical or surgical changes since previsit or office visit.  Temp JB, VS CW 

## 2020-01-26 NOTE — Patient Instructions (Signed)
YOU HAD AN ENDOSCOPIC PROCEDURE TODAY AT THE [facility]:   Refer to the procedure report that was given to you for any specific questions about what was found during the examination.  If the procedure report does not answer your questions, please call your gastroenterologist to clarify.  If you requested that your care partner not be given the details of your procedure findings, then the procedure report has been included in a sealed envelope for you to review at your convenience later.  YOU SHOULD EXPECT: Some feelings of bloating in the abdomen. Passage of more gas than usual.  Walking can help get rid of the air that was put into your GI tract during the procedure and reduce the bloating. If you had a lower endoscopy (such as a colonoscopy or flexible sigmoidoscopy) you may notice spotting of blood in your stool or on the toilet paper. If you underwent a bowel prep for your procedure, you may not have a normal bowel movement for a few days.  Please Note:  You might notice some irritation and congestion in your nose or some drainage.  This is from the oxygen used during your procedure.  There is no need for concern and it should clear up in a day or so.  SYMPTOMS TO REPORT IMMEDIATELY:   Following lower endoscopy (colonoscopy or flexible sigmoidoscopy):  Excessive amounts of blood in the stool  Significant tenderness or worsening of abdominal pains  Swelling of the abdomen that is new, acute  Fever of 100F or higher  For urgent or emergent issues, a gastroenterologist can be reached at any hour by calling [phone]. Do not use MyChart messaging for urgent concerns.    DIET:  We do recommend a small meal at first, but then you may proceed to your regular diet.  Drink plenty of fluids but you should avoid alcoholic beverages for 24 hours.  ACTIVITY:  You should plan to take it easy for the rest of today and you should NOT DRIVE or use heavy machinery until tomorrow (because  of the sedation medicines used during the test).    FOLLOW UP: Our staff will call the number listed on your records 48-72 hours following your procedure to check on you and address any questions or concerns that you may have regarding the information given to you following your procedure. If we do not reach you, we will leave a message.  We will attempt to reach you two times.  During this call, we will ask if you have developed any symptoms of COVID 19. If you develop any symptoms (ie: fever, flu-like symptoms, shortness of breath, cough etc.) before then, please call [phone].  If you test positive for Covid 19 in the 2 weeks post procedure, please call and report this information to us.    If any biopsies were taken you will be contacted by phone or by letter within the next 1-3 weeks.  Please call us at [phone] if you have not heard about the biopsies in 3 weeks.    SIGNATURES/CONFIDENTIALITY: You and/or your care partner have signed paperwork which will be entered into your electronic medical record.  These signatures attest to the fact that that the information above on your After Visit Summary has been reviewed and is understood.  Full responsibility of the confidentiality of this discharge information lies with you and/or your care-partner. 

## 2020-01-26 NOTE — Progress Notes (Signed)
To PACU, VSS. Report to [email or web address].tb 

## 2020-01-26 NOTE — Op Note (Signed)
[place] Patient Name: Jamill Prochnow Procedure Date: 01/26/2020 8:27 AM MRN: [id number] Endoscopist: Ladene Artist , MD Age: 54 Referring MD:  Date of Birth: 1966-05-19 Gender: Male Account #: [id number] Procedure:                Colonoscopy Indications:              Screening for colorectal malignant neoplasm Medicines:                Monitored Anesthesia Care Procedure:                Pre-Anesthesia Assessment:                           - Prior to the procedure, a History and Physical                            was performed, and patient medications and                            allergies were reviewed. The patient's tolerance of                            previous anesthesia was also reviewed. The risks                            and benefits of the procedure and the sedation                            options and risks were discussed with the patient.                            All questions were answered, and informed consent                            was obtained. Prior Anticoagulants: The patient has                            taken no previous anticoagulant or antiplatelet                            agents. ASA Grade Assessment: II - A patient with                            mild systemic disease. After reviewing the risks                            and benefits, the patient was deemed in                            satisfactory condition to undergo the procedure.                           After obtaining informed consent, the colonoscope  was passed under direct vision. Throughout the                            procedure, the patient's blood pressure, pulse, and                            oxygen saturations were monitored continuously. The                            Colonoscope was introduced through the anus and                            advanced to the the cecum, identified by                            appendiceal orifice and  ileocecal valve. The                            ileocecal valve, appendiceal orifice, and rectum                            were photographed. The quality of the bowel                            preparation was excellent. The colonoscopy was                            performed without difficulty. The patient tolerated                            the procedure well. Scope In: 8:36:35 AM Scope Out: 8:50:22 AM Scope Withdrawal Time: 0 hours 8 minutes 40 seconds  Total Procedure Duration: 0 hours 13 minutes 47 seconds  Findings:                 The perianal and digital rectal examinations were                            normal.                           Anal papilla(e) were hypertrophied. Biopsies were                            taken with a cold forceps for histology.                           The exam was otherwise without abnormality on                            direct and retroflexion views. Complications:            No immediate complications. Estimated blood loss:                            None. Estimated Blood Loss:  Estimated blood loss: none. Impression:               - Anal papilla(e) were hypertrophied. Biopsied.                           - The examination was otherwise normal on direct                            and retroflexion views. Recommendation:           - Repeat colonoscopy after studies are complete for                            surveillance based on pathology results.                           - Patient has a contact number available for                            emergencies. The signs and symptoms of potential                            delayed complications were discussed with the                            patient. Return to normal activities tomorrow.                            Written discharge instructions were provided to the                            patient.                           - Resume previous diet.                           - Continue present  medications.                           - Await pathology results. Ladene Artist, MD 01/26/2020 8:53:00 AM This report has been signed electronically.

## 2020-01-27 ENCOUNTER — Other Ambulatory Visit: Payer: Self-pay | Admitting: Internal Medicine

## 2020-01-28 ENCOUNTER — Telehealth: Payer: Self-pay | Admitting: *Deleted

## 2020-01-28 ENCOUNTER — Telehealth: Payer: Self-pay

## 2020-01-28 NOTE — Telephone Encounter (Signed)
Pt wanting to know if his doctor had switched him back to Lantus from [place] since he was unable to affordTresiba . On 3/2 OV - "please switch tresiba to lantus 20u daily ". Informed Lantus rx was sent to the pharmacy on 3/2 which he will call. He will call back if he has any problems. Dr Maricela Bo, is this correct?

## 2020-01-28 NOTE — Telephone Encounter (Signed)
  Follow up Call-  Call back number 01/26/2020  Post procedure Call Back phone  # [phone]  Permission to leave phone message Yes  Some recent data might be hidden     Patient questions:  Do you have a fever, pain , or abdominal swelling? No. Pain Score  0 *  Have you tolerated food without any problems? Yes.    Have you been able to return to your normal activities? Yes.    Do you have any questions about your discharge instructions: Diet   No. Medications  No. Follow up visit  No.  Do you have questions or concerns about your Care? No.  Actions: * If pain score is 4 or above: No action needed, pain <4.  1. Have you developed a fever since your procedure? no  2.   Have you had an respiratory symptoms (SOB or cough) since your procedure? no  3.   Have you tested positive for COVID 19 since your procedure no  4.   Have you had any family members/close contacts diagnosed with the COVID 19 since your procedure?  no   If yes to any of these questions please route to Joylene John, RN and Erenest Rasher, RN

## 2020-01-31 NOTE — Telephone Encounter (Signed)
Whichever is more affordable for Wayne Wolfe, is okay with me. Looks like Lantus was sent in March.

## 2020-02-11 ENCOUNTER — Ambulatory Visit: Payer: PRIVATE HEALTH INSURANCE | Admitting: Internal Medicine

## 2020-02-11 ENCOUNTER — Other Ambulatory Visit: Payer: Self-pay

## 2020-02-11 ENCOUNTER — Encounter: Payer: Self-pay | Admitting: Internal Medicine

## 2020-02-11 VITALS — BP 126/78 | HR 90 | Temp 98.3°F | Ht 66.0 in | Wt 250.6 lb

## 2020-02-11 DIAGNOSIS — Z7984 Long term (current) use of oral hypoglycemic drugs: Secondary | ICD-10-CM

## 2020-02-11 DIAGNOSIS — I152 Hypertension secondary to endocrine disorders: Secondary | ICD-10-CM

## 2020-02-11 DIAGNOSIS — E1159 Type 2 diabetes mellitus with other circulatory complications: Secondary | ICD-10-CM | POA: Diagnosis not present

## 2020-02-11 DIAGNOSIS — E1165 Type 2 diabetes mellitus with hyperglycemia: Secondary | ICD-10-CM | POA: Diagnosis not present

## 2020-02-11 DIAGNOSIS — A539 Syphilis, unspecified: Secondary | ICD-10-CM

## 2020-02-11 DIAGNOSIS — M2550 Pain in unspecified joint: Secondary | ICD-10-CM | POA: Diagnosis not present

## 2020-02-11 DIAGNOSIS — Z794 Long term (current) use of insulin: Secondary | ICD-10-CM

## 2020-02-11 DIAGNOSIS — I10 Essential (primary) hypertension: Secondary | ICD-10-CM

## 2020-02-11 MED ORDER — LISINOPRIL 20 MG PO TABS: 20.0000 mg | ORAL_TABLET | Freq: Every day | ORAL | 1 refills | Status: DC

## 2020-02-11 NOTE — Assessment & Plan Note (Signed)
Patient states that he has been having pain in bilateral knees down to ankle for the past few weeks. He is accompanied pain in his neck, bilateral MCP and PIP joints.  He states that he has occasional morning stiffness lasting approximately 10 minutes.  Assessment and plan Given the patient's multiple joint involvement we will check for rheumatoid arthritis with RF, CCP year.  It is more likely that the patient has osteoarthritis given his risk factors.

## 2020-02-11 NOTE — Assessment & Plan Note (Signed)
The patient's last a1c= 6.5 on March 2021.  Patient is currently taking Metformin 1000 twice daily, Victoza 1.2 mg daily, and a long acting insulin 20 units.  He states that he has been having trouble obtaining his long-acting insulin due to insurance denying many medications.  Assessment and plan The patient's blood glucose is likely to be uncontrolled given that the patient has not taken his long-acting insulin in 1 week.  Gave the patient a 15-day dose of Basaglar to take 20 units daily.  We will also refer the patient to chronic care management.  Instructed the patient to bring his blood glucometer to next visit in 4 weeks.  We will do A1c check at that time.

## 2020-02-11 NOTE — Patient Instructions (Addendum)
It was a pleasure to see you today Wayne Wolfe. Please make the following changes:  -please use basaglar 20u nightly  -I have ordered some blood work for you to check about whether you have arthritis  -please follow up in 4 weeks   If you have any questions or concerns, please call our clinic at [phone] between 9am-5pm and after hours call [phone] and ask for the internal medicine resident on call. If you feel you are having a medical emergency please call 911.   Thank you, we look forward to help you remain healthy!  Lars Mage, MD Internal Medicine PGY3

## 2020-02-11 NOTE — Assessment & Plan Note (Signed)
The patient reports tested positive for asymptomatic syphilis with an RPR 1:32 and FTA positivity.  He received penicillin injection in March 2021.  Assessment and plan Repeat RPR testing 6 months after penicillin shots-September 2021

## 2020-02-11 NOTE — Assessment & Plan Note (Signed)
The patient's blood pressure during the stay was 126/78.  Patient is currently taking lisinopril 20 mg daily.  Assessment and plan Due to the patient's blood pressure being well controlled we will continue lisinopril 20 mg daily.  Provided refill.

## 2020-02-11 NOTE — Progress Notes (Signed)
   CC: Hypertension follow up  HPI:  [name] is a 54 y.o. no HIV, diabetes, hypertension, prior history of COVID-19 infection who presents for diabetes follow-up. Please see problem based charting for evaluation, assessment, and plan.  Past Medical History:  Diagnosis Date  . Acute respiratory failure with hypoxia ([place]) 01/12/2019  . Allergy    seasonal  . COVID-19 12/2018  . Dizziness 03/13/2017  . Flu-like symptoms 12/02/2018  . Flu-like symptoms 12/02/2018  . HIV (human immunodeficiency virus infection) ([place]) 2009   11/20/2012 Last CD4 count 210 and VL <20  . 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 Hypertension 2009   Well controlled on HCTZ  . Hypokalemia 01/12/2019  . Lobar pneumonia ([place]) 01/12/2019  . Otitis media 03/13/2017  . Polyuria 03/13/2017  . Poorly controlled diabetes mellitus ([place]) 03/13/2017  . Type 2 diabetes mellitus (HCC)    Well controlled on metformin  . Weight loss 03/13/2017   Review of Systems:    Denies n/v, fever, chills Occasional cough  Physical Exam:  Vitals:   02/11/20 1550  BP: 126/78  Pulse: 90  Temp: 98.3 F (36.8 C)  TempSrc: Oral  SpO2: 98%  Weight: 250 lb 9.6 oz (113.7 kg)  Height: 5\' 6"  (1.676 m)   Physical Exam  Constitutional: He appears well-developed and well-nourished. No distress.  Cardiovascular: Normal rate, regular rhythm and normal heart sounds.  Respiratory: Effort normal and breath sounds normal. No respiratory distress. He has no wheezes.  GI: Soft. Bowel sounds are normal. He exhibits no distension. There is no abdominal tenderness.  Musculoskeletal:        General: No edema.     Comments: No significant tenderness to palpation at bilateral knees, ankles elbows.  No effusion, warmth appreciated at joints.  5/5 strength in bilateral lower extremities.  Neurological: He is alert.  Skin: He is not diaphoretic.  Psychiatric: He has a normal mood and affect. His behavior is normal. Judgment and  thought content normal.     Assessment & Plan:   See Encounters Tab for problem based charting.  Patient discussed with Dr. Evette Doffing

## 2020-02-12 NOTE — Progress Notes (Signed)
Internal Medicine Clinic Attending  Case discussed with Dr. Chundi at the time of the visit.  We reviewed the resident's history and exam and pertinent patient test results.  I agree with the assessment, diagnosis, and plan of care documented in the resident's note. 

## 2020-02-13 LAB — CYCLIC CITRUL PEPTIDE ANTIBODY, IGG/IGA: Cyclic Citrullin Peptide Ab: <1 units (ref 0–19)

## 2020-02-13 LAB — RHEUMATOID FACTOR: Rheumatoid fact SerPl-aCnc: <10 IU/mL (ref 0.0–13.9)

## 2020-02-15 ENCOUNTER — Other Ambulatory Visit: Payer: Self-pay

## 2020-02-15 ENCOUNTER — Telehealth: Payer: Self-pay

## 2020-02-15 DIAGNOSIS — B20 Human immunodeficiency virus [HIV] disease: Secondary | ICD-10-CM

## 2020-02-15 MED ORDER — ODEFSEY 200-25-25 MG PO TABS: 1.0000 | ORAL_TABLET | Freq: Every day | ORAL | 3 refills | Status: DC

## 2020-02-15 NOTE — Telephone Encounter (Signed)
Prior authorization initiated for patient's [place].   Key: [id number]

## 2020-02-16 ENCOUNTER — Encounter: Payer: Self-pay | Admitting: Internal Medicine

## 2020-02-16 ENCOUNTER — Telehealth: Payer: Self-pay | Admitting: *Deleted

## 2020-02-16 DIAGNOSIS — K6282 Dysplasia of anus: Secondary | ICD-10-CM | POA: Insufficient documentation

## 2020-02-16 NOTE — Telephone Encounter (Signed)
Prior authorization approved for [facility]. Will notify pharmacy.   Vimal Derego Lorita Officer, RN

## 2020-02-16 NOTE — Telephone Encounter (Signed)
RTC from patient.  Given notification of normal lab results per Dr. Maricela Bo.  Patient voiced understanding of message and had no further questions.  Sharol Harness 02/16/2020 9:47 AM

## 2020-02-18 ENCOUNTER — Telehealth: Payer: Self-pay | Admitting: Gastroenterology

## 2020-02-18 NOTE — Telephone Encounter (Signed)
Pt called returning your call please call him back at [phone]

## 2020-02-18 NOTE — Telephone Encounter (Signed)
Patient notified he will need to contact his insurance and find a colorectal surgeon in the area and we will be happy to make the referral

## 2020-02-18 NOTE — Telephone Encounter (Signed)
Pt called to inform that [facility] does not par with his insurance [employer]. Pt wants to be referred somewhere else.

## 2020-02-19 NOTE — Telephone Encounter (Signed)
Patient will call me back once he has spoken with the insurance.

## 2020-02-22 NOTE — Telephone Encounter (Signed)
Patient was given names as our doctors as Estate agent.  He will check if his insurance participates with [facility] and call me back

## 2020-02-22 NOTE — Telephone Encounter (Signed)
Patient is calling you back. 

## 2020-02-23 ENCOUNTER — Encounter: Payer: PRIVATE HEALTH INSURANCE | Admitting: *Deleted

## 2020-02-24 NOTE — Telephone Encounter (Signed)
Patient has been scheduled at [facility] with Dr. Morton Stall for 03/15/20 8:30.  He is notified he will have his consult here in [place] but will need to go to [facility] for surgery.  He is going to [address]

## 2020-02-26 NOTE — Telephone Encounter (Signed)
Dr. Morton Stall' office called stating that they have not received pt's records yet for his appt. Pls fax them to [phone].

## 2020-03-01 NOTE — Telephone Encounter (Signed)
Records faxed.

## 2020-03-02 NOTE — Telephone Encounter (Signed)
Patient was a direct.  No additional records available.  I sent a fax notifying them of this

## 2020-03-02 NOTE — Telephone Encounter (Signed)
Dr. Cheron Schaumann office called stating they received colonoscopy and path reports however, they did not receive any office notes.  Please fax office notes to [phone]

## 2020-03-04 ENCOUNTER — Encounter: Payer: Self-pay | Admitting: Dietician

## 2020-03-04 ENCOUNTER — Telehealth: Payer: Self-pay | Admitting: Dietician

## 2020-03-04 DIAGNOSIS — E1165 Type 2 diabetes mellitus with hyperglycemia: Secondary | ICD-10-CM

## 2020-03-04 NOTE — Telephone Encounter (Signed)
Pt called asking for sample of basal insulin because he cannot afford his current basal insulin. Noted Dr. Thea Gist intention for chronic care management, therefore sent referral. Will also send in-basket message to Cheryle Horsfall. Arranged to give pt a sample of Tresiba while he is in the office on Monday.   Debera Lat, RD 03/04/2020 2:55 PM.

## 2020-03-07 NOTE — Telephone Encounter (Signed)
I'll ask Dr. Maricela Bo to send that prescription for Basaglar to the [facility] on [address]

## 2020-03-07 NOTE — Telephone Encounter (Signed)
Pt's ins will cover Basaglar-there is also a coupon available for pt use that will bring his copay down to $5.  If appropriate, can you please send an rx for Basaglar to the [facility] on [address].  Once the rx is at the pharmacy, I will contact them with the coupon information!

## 2020-03-08 MED ORDER — BASAGLAR KWIKPEN 100 UNIT/ML ~~LOC~~ SOPN: 20.0000 [IU] | PEN_INJECTOR | Freq: Every day | SUBCUTANEOUS | 1 refills | Status: DC

## 2020-03-08 NOTE — Addendum Note (Signed)
Addended by: Lars Mage on: 03/08/2020 08:27 AM   Modules accepted: Orders

## 2020-03-08 NOTE — Telephone Encounter (Signed)
Sent basaglar 20u. Thank you!

## 2020-03-17 ENCOUNTER — Encounter: Payer: PRIVATE HEALTH INSURANCE | Admitting: Internal Medicine

## 2020-03-17 ENCOUNTER — Telehealth: Payer: Self-pay | Admitting: *Deleted

## 2020-03-17 NOTE — Telephone Encounter (Signed)
Pt did not come to his appt this am. Called pt - stated he forgot he had an appt today - instructed to call back beginning of July to re-schedule with his new PCP.

## 2020-03-17 NOTE — Progress Notes (Deleted)
   CC: ***  HPI:  [name] is a 54 y.o. male living with hiv, diabetes mellitus, hx of covid19 infection, hyperlipidemia who presents for follow up of diabetes. Please see problem based charting for evaluation, assessment, and plan.  The patient's last a1c=6.5 on 12/01/19. The patient's home  blood glucose measurements over the past month have ranged ***. The patient does/does not note episodes of hypoglycemia.   The patient is currently taking lantus 20u qhs, metformin 1000mg  bid, victoza 1.2mg  daily. The patient is compliant with medication.    Patient's weight changes ***   Why can we not dilate eyes?    Patient states that he has pain ***. Patient's rf and ccp returned within normal range.   Osteoarthritis   -physical therapy   The patient's blood pressure during this visit was ***. The patient is currently taking lisinopril 20mg  qd. His/her *** last blood pressure visits are  BP Readings from Last 3 Encounters:  02/11/20 126/78  01/26/20 128/87  12/14/19 126/76    The patient does/does not *** report palpitations, dizziness, chest pain, sob ***.  Assessment and Plan ***   Past Medical History:  Diagnosis Date  . Acute respiratory failure with hypoxia ([place]) 01/12/2019  . Allergy    seasonal  . COVID-19 12/2018  . Dizziness 03/13/2017  . Flu-like symptoms 12/02/2018  . Flu-like symptoms 12/02/2018  . HIV (human immunodeficiency virus infection) ([place]) 2009   11/20/2012 Last CD4 count 210 and VL <20  . 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 Hypertension 2009   Well controlled on HCTZ  . Hypokalemia 01/12/2019  . Lobar pneumonia ([place]) 01/12/2019  . Otitis media 03/13/2017  . Polyuria 03/13/2017  . Poorly controlled diabetes mellitus ([place]) 03/13/2017  . Type 2 diabetes mellitus (HCC)    Well controlled on metformin  . Weight loss 03/13/2017   Review of Systems:  ***  Physical Exam:  There were no vitals filed for this  visit. ***  Assessment & Plan:   See Encounters Tab for problem based charting.  Patient {GC/GE:[id number]::"discussed with","seen with"} Dr. {NAMES:[id number]::"Butcher","Guilloud","Hoffman","Mullen","Narendra","Raines","Vincent"}

## 2020-03-18 ENCOUNTER — Encounter: Payer: Self-pay | Admitting: *Deleted

## 2020-04-18 ENCOUNTER — Ambulatory Visit: Payer: Self-pay

## 2020-04-18 ENCOUNTER — Other Ambulatory Visit: Payer: Self-pay

## 2020-04-18 ENCOUNTER — Telehealth: Payer: Self-pay | Admitting: *Deleted

## 2020-04-18 DIAGNOSIS — B20 Human immunodeficiency virus [HIV] disease: Secondary | ICD-10-CM

## 2020-04-18 NOTE — Telephone Encounter (Signed)
Patient has [facility] labs today 7/19, scheduled to follow up with Dr Tommy Medal in August.  He is also seeing [facility] for treatment.  Follow up scheduled with them 8/3.  Wayne Wolfe Wayne Stall, MD Colorectal Surgery - [place]  [address] [place], Wayne [place] [address]  [phone]  Wayne Gandy, RN  ===View-only below this line=== ----- Message ----- From: Tommy Medal, Lavell Islam, MD Sent: 04/17/2020  11:47 AM EDT To: Bobbie Stack, RN, [employer], * Subject: RE:                                            Can we get Wayne Wolfe in w provider and get him to oncology? ----- Message ----- From: Lars Mage, MD Sent: 03/18/2020   3:29 PM EDT To: Truman Hayward, MD, Lars Mage, MD, *  Please make sure to schedule follow up asap for above pt. He has new cancer diagnosis. Missed continuity visit with me and with infectious diseases. Thank you!  Best,   [employer]

## 2020-04-19 LAB — T-HELPER CELL (CD4) - (RCID CLINIC ONLY)
CD4 % Helper T Cell: 21 % — ABNORMAL LOW (ref 33–65)
CD4 T Cell Abs: 458 /uL (ref 400–1790)

## 2020-04-19 LAB — CYTOLOGY, (ORAL, ANAL, URETHRAL) ANCILLARY ONLY
Chlamydia: NEGATIVE
Comment: NEGATIVE
Comment: NORMAL
Neisseria Gonorrhea: NEGATIVE

## 2020-04-21 LAB — COMPLETE METABOLIC PANEL WITH GFR
AG Ratio: 1.6 (calc) (ref 1.0–2.5)
ALT: 11 U/L (ref 9–46)
AST: 10 U/L (ref 10–35)
Albumin: 4 g/dL (ref 3.6–5.1)
Alkaline phosphatase (APISO): 60 U/L (ref 35–144)
BUN: 9 mg/dL (ref 7–25)
CO2: 31 mmol/L (ref 20–32)
Calcium: 9 mg/dL (ref 8.6–10.3)
Chloride: 101 mmol/L (ref 98–110)
Creat: 1.04 mg/dL (ref 0.70–1.33)
GFR, Est African American: 93 mL/min/{1.73_m2} (ref >=60)
GFR, Est Non African American: 80 mL/min/{1.73_m2} (ref >=60)
Globulin: 2.5 g/dL (calc) (ref 1.9–3.7)
Glucose, Bld: 147 mg/dL — ABNORMAL HIGH (ref 65–99)
Potassium: 4.6 mmol/L (ref 3.5–5.3)
Sodium: 136 mmol/L (ref 135–146)
Total Bilirubin: 0.8 mg/dL (ref 0.2–1.2)
Total Protein: 6.5 g/dL (ref 6.1–8.1)

## 2020-04-21 LAB — HIV-1 RNA QUANT-NO REFLEX-BLD
HIV 1 RNA Quant: <20 copies/mL — AB
HIV-1 RNA Quant, Log: <1.3 Log copies/mL — AB

## 2020-04-21 LAB — CBC WITH DIFFERENTIAL/PLATELET
Absolute Monocytes: 424 cells/uL (ref 200–950)
Basophils Absolute: 60 cells/uL (ref 0–200)
Basophils Relative: 1.5 %
Eosinophils Absolute: 112 cells/uL (ref 15–500)
Eosinophils Relative: 2.8 %
HCT: 47.7 % (ref 38.5–50.0)
Hemoglobin: 15.8 g/dL (ref 13.2–17.1)
Lymphs Abs: 2128 cells/uL (ref 850–3900)
MCH: 29.9 pg (ref 27.0–33.0)
MCHC: 33.1 g/dL (ref 32.0–36.0)
MCV: 90.2 fL (ref 80.0–100.0)
MPV: 9.3 fL (ref 7.5–12.5)
Monocytes Relative: 10.6 %
Neutro Abs: 1276 cells/uL — ABNORMAL LOW (ref 1500–7800)
Neutrophils Relative %: 31.9 %
Platelets: 311 10*3/uL (ref 140–400)
RBC: 5.29 10*6/uL (ref 4.20–5.80)
RDW: 13.1 % (ref 11.0–15.0)
Total Lymphocyte: 53.2 %
WBC: 4 10*3/uL (ref 3.8–10.8)

## 2020-04-21 LAB — RPR: RPR Ser Ql: REACTIVE — AB

## 2020-04-21 LAB — RPR TITER: RPR Titer: 1:4 {titer} — ABNORMAL HIGH

## 2020-04-21 LAB — FLUORESCENT TREPONEMAL AB(FTA)-IGG-BLD: Fluorescent Treponemal ABS: REACTIVE — AB

## 2020-04-29 ENCOUNTER — Ambulatory Visit (INDEPENDENT_AMBULATORY_CARE_PROVIDER_SITE_OTHER): Payer: Self-pay | Admitting: Student

## 2020-04-29 ENCOUNTER — Encounter: Payer: Self-pay | Admitting: Student

## 2020-04-29 ENCOUNTER — Other Ambulatory Visit: Payer: Self-pay

## 2020-04-29 VITALS — BP 135/83 | HR 80 | Temp 97.7°F | Ht 66.0 in | Wt 249.0 lb

## 2020-04-29 DIAGNOSIS — A539 Syphilis, unspecified: Secondary | ICD-10-CM

## 2020-04-29 DIAGNOSIS — E1165 Type 2 diabetes mellitus with hyperglycemia: Secondary | ICD-10-CM

## 2020-04-29 DIAGNOSIS — B20 Human immunodeficiency virus [HIV] disease: Secondary | ICD-10-CM

## 2020-04-29 DIAGNOSIS — Z0000 Encounter for general adult medical examination without abnormal findings: Secondary | ICD-10-CM

## 2020-04-29 DIAGNOSIS — K6282 Dysplasia of anus: Secondary | ICD-10-CM

## 2020-04-29 LAB — POCT GLYCOSYLATED HEMOGLOBIN (HGB A1C): Hemoglobin A1C: 7.6 % — AB (ref 4.0–5.6)

## 2020-04-29 LAB — GLUCOSE, CAPILLARY: Glucose-Capillary: 135 mg/dL — ABNORMAL HIGH (ref 70–99)

## 2020-04-29 MED ORDER — VICTOZA 18 MG/3ML ~~LOC~~ SOPN: PEN_INJECTOR | SUBCUTANEOUS | 1 refills | Status: DC

## 2020-04-29 NOTE — Patient Instructions (Addendum)
It was a pleasure seeing you in clinic. Today we discussed:   Diabetes: Please remember to take your medications regularly Insulin at night, Victoza and Metformin in the day time. Monitor you blood glucose at home first thing in the morning before meals. You can also do a second check after meals. We checked you B12 today I will call you if it is abnormal and if we need to make any changes.  Please follow up with surgery and HIV clinic.   Follow up in 3 months.  If you have any questions or concerns, please call our clinic at [phone] between 9am-5pm and after hours call [phone] and ask for the internal medicine resident on call. If you feel you are having a medical emergency please call 911.   Thank you, we look forward to helping you remain healthy!

## 2020-04-29 NOTE — Assessment & Plan Note (Signed)
Has follow up appointment with colorectal surgeon on 05/03/2020

## 2020-04-29 NOTE — Assessment & Plan Note (Signed)
Does not want eye exam today will make another appointment for eye exam.

## 2020-04-29 NOTE — Assessment & Plan Note (Addendum)
A1 c today is 7.5. Blood glucose ranging from 120s to 150s which he checks sporadically. Patient currently on metformin 1000 twice daily, Victoza 1.2 mg daily, and basaglar 20 units daily. States he is tolerating new insulin well and has not had further trouble getting insulin, but has been forgetting to use his insulin occasionally and eating more. Explained to the patient the importance of medication adherence, and reviewed medication dosages and times with him. Also advised him to consistently check blood glucose in the mornings prior eating. Patient feels he is capable of making these changes.  Plan Patients elevated hA1c likely related to trouble obtaining his long acting insulin and inconsistent insulin usage. Will likely improve with better compliance, recheck a1c in 3 months if not improved will consider adding SGLT2 inhibitor

## 2020-04-29 NOTE — Assessment & Plan Note (Signed)
RPR 1:4 on 04/18/2020 with 8 fold decrease in titers showing adequate response to treatment in March 2021. Recheck  RPR testing in 6 months.

## 2020-04-29 NOTE — Assessment & Plan Note (Signed)
Has follow up in mid August with Dr. Drucilla Schmidt last labs appear stable.

## 2020-04-29 NOTE — Assessment & Plan Note (Signed)
" >>  ASSESSMENT AND PLAN FOR HUMAN IMMUNODEFICIENCY VIRUS (HIV) DISEASE (HCC) WRITTEN ON 04/29/2020  9:58 PM BY LEMON RAISIN, MD  Has follow up in mid August with Dr. Lindia last labs appear stable. "

## 2020-04-29 NOTE — Progress Notes (Signed)
   CC: follow up  HPI:  [name] is a 54 y.o. male with history documented below who presents today for follow up. Please refer to problem based charting for further details and assessment and plan of current problem and chronic medical conditions.   Past Medical History:  Diagnosis Date  . Acute respiratory failure with hypoxia ([place]) 01/12/2019  . Allergy    seasonal  . COVID-19 12/2018  . Dizziness 03/13/2017  . Flu-like symptoms 12/02/2018  . Flu-like symptoms 12/02/2018  . HIV (human immunodeficiency virus infection) ([place]) 2009   11/20/2012 Last CD4 count 210 and VL <20  . 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 Hypertension 2009   Well controlled on HCTZ  . Hypokalemia 01/12/2019  . Lobar pneumonia ([place]) 01/12/2019  . Otitis media 03/13/2017  . Polyuria 03/13/2017  . Poorly controlled diabetes mellitus ([place]) 03/13/2017  . Type 2 diabetes mellitus (HCC)    Well controlled on metformin  . Weight loss 03/13/2017   Review of Systems:  Negative as per HPI   Physical Exam:  Vitals:   04/29/20 0921  BP: (!) 135/83  Pulse: 80  Temp: 97.7 F (36.5 C)  TempSrc: Oral  SpO2: 99%  Weight: (!) 249 lb (112.9 kg)  Height: 5\' 6"  (1.676 m)   Physical Exam  Constitutional: Appears well-developed and well-nourished. No distress.  HENT:  Head: Normocephalic and atraumatic.  Eyes: Conjunctivae are normal.  Cardiovascular: Normal rate, regular rhythm and normal heart sounds.  Respiratory: Effort normal and breath sounds normal. No respiratory distress. No wheezes.  GI: Soft. Bowel sounds are normal. No distension. There is no tenderness.  Musculoskeletal: Normal tone, no joint tenderness Neurological: Is alert.and oriented x3 Skin: Not diaphoretic. No erythema.  Psychiatric: Normal mood and affect. Behavior is normal. Judgment and thought content normal.    Assessment & Plan:   See Encounters Tab for problem based charting.  Patient seen with Dr.  Evette Doffing

## 2020-04-30 LAB — VITAMIN B12: Vitamin B-12: 311 pg/mL (ref 232–1245)

## 2020-05-01 NOTE — Progress Notes (Signed)
Internal Medicine Clinic Attending  I saw and evaluated the patient.  I personally confirmed the key portions of the history and exam documented by Dr. Liang and I reviewed pertinent patient test results.  The assessment, diagnosis, and plan were formulated together and I agree with the documentation in the resident's note.  

## 2020-05-04 ENCOUNTER — Encounter: Payer: Self-pay | Admitting: Infectious Disease

## 2020-05-09 ENCOUNTER — Encounter: Payer: Self-pay | Admitting: Infectious Disease

## 2020-05-12 ENCOUNTER — Ambulatory Visit: Payer: Medicaid Other | Admitting: Infectious Disease

## 2020-05-18 ENCOUNTER — Encounter (INDEPENDENT_AMBULATORY_CARE_PROVIDER_SITE_OTHER): Payer: Self-pay | Admitting: *Deleted

## 2020-05-18 ENCOUNTER — Other Ambulatory Visit: Payer: Self-pay

## 2020-05-18 VITALS — BP 126/86 | HR 79 | Temp 98.0°F | Wt 249.2 lb

## 2020-05-18 DIAGNOSIS — Z006 Encounter for examination for normal comparison and control in clinical research program: Secondary | ICD-10-CM

## 2020-05-18 NOTE — Research (Signed)
Wayne Wolfe is here for his final study visit for A5322, HAILO Study: A Long Term follow-up of Older HIV-Infected Adults in the ACTG, an observational study addressing the issues of aging, HIV infection and Inflammation. No new complaints or concerns. He has received both doses of his Covid-19 vaccine. Did not repeat his CD4 or VL labs today since he had these done in the clinic back in July. States that he is scheduled to have a colonoscopy on Friday at [facility]. He is scheduled to see Dr. Tommy Medal tomorrow. He is interested in participating in another study once something is available.

## 2020-05-19 ENCOUNTER — Encounter: Payer: Self-pay | Admitting: Infectious Disease

## 2020-05-19 ENCOUNTER — Ambulatory Visit (INDEPENDENT_AMBULATORY_CARE_PROVIDER_SITE_OTHER): Payer: Self-pay | Admitting: Infectious Disease

## 2020-05-19 VITALS — BP 133/86 | HR 84 | Wt 247.0 lb

## 2020-05-19 DIAGNOSIS — E1169 Type 2 diabetes mellitus with other specified complication: Secondary | ICD-10-CM

## 2020-05-19 DIAGNOSIS — K6282 Dysplasia of anus: Secondary | ICD-10-CM

## 2020-05-19 DIAGNOSIS — I10 Essential (primary) hypertension: Secondary | ICD-10-CM

## 2020-05-19 DIAGNOSIS — E785 Hyperlipidemia, unspecified: Secondary | ICD-10-CM

## 2020-05-19 DIAGNOSIS — B20 Human immunodeficiency virus [HIV] disease: Secondary | ICD-10-CM

## 2020-05-19 DIAGNOSIS — I152 Hypertension secondary to endocrine disorders: Secondary | ICD-10-CM

## 2020-05-19 DIAGNOSIS — E1165 Type 2 diabetes mellitus with hyperglycemia: Secondary | ICD-10-CM

## 2020-05-19 DIAGNOSIS — Z8616 Personal history of COVID-19: Secondary | ICD-10-CM

## 2020-05-19 DIAGNOSIS — E1159 Type 2 diabetes mellitus with other circulatory complications: Secondary | ICD-10-CM

## 2020-05-19 LAB — CBC WITH DIFFERENTIAL/PLATELET
Absolute Monocytes: 418 cells/uL (ref 200–950)
Basophils Absolute: 38 cells/uL (ref 0–200)
Basophils Relative: 0.8 %
Eosinophils Absolute: 99 cells/uL (ref 15–500)
Eosinophils Relative: 2.1 %
HCT: 48.6 % (ref 38.5–50.0)
Hemoglobin: 16 g/dL (ref 13.2–17.1)
Lymphs Abs: 2449 cells/uL (ref 850–3900)
MCH: 29.9 pg (ref 27.0–33.0)
MCHC: 32.9 g/dL (ref 32.0–36.0)
MCV: 90.7 fL (ref 80.0–100.0)
MPV: 9.8 fL (ref 7.5–12.5)
Monocytes Relative: 8.9 %
Neutro Abs: 1697 cells/uL (ref 1500–7800)
Neutrophils Relative %: 36.1 %
Platelets: 285 10*3/uL (ref 140–400)
RBC: 5.36 10*6/uL (ref 4.20–5.80)
RDW: 13 % (ref 11.0–15.0)
Total Lymphocyte: 52.1 %
WBC: 4.7 10*3/uL (ref 3.8–10.8)

## 2020-05-19 LAB — COMPREHENSIVE METABOLIC PANEL
AG Ratio: 1.6 (calc) (ref 1.0–2.5)
ALT: 15 U/L (ref 9–46)
AST: 9 U/L — ABNORMAL LOW (ref 10–35)
Albumin: 4.1 g/dL (ref 3.6–5.1)
Alkaline phosphatase (APISO): 67 U/L (ref 35–144)
BUN: 10 mg/dL (ref 7–25)
CO2: 26 mmol/L (ref 20–32)
Calcium: 9.3 mg/dL (ref 8.6–10.3)
Chloride: 103 mmol/L (ref 98–110)
Creat: 1.14 mg/dL (ref 0.70–1.33)
Globulin: 2.6 g/dL (calc) (ref 1.9–3.7)
Glucose, Bld: 172 mg/dL — ABNORMAL HIGH (ref 65–99)
Potassium: 4.3 mmol/L (ref 3.5–5.3)
Sodium: 138 mmol/L (ref 135–146)
Total Bilirubin: 0.5 mg/dL (ref 0.2–1.2)
Total Protein: 6.7 g/dL (ref 6.1–8.1)

## 2020-05-19 LAB — LIPID PANEL
Cholesterol: 168 mg/dL (ref <200)
HDL: 28 mg/dL — ABNORMAL LOW (ref >=40)
LDL Cholesterol (Calc): 117 mg/dL (calc) — ABNORMAL HIGH
Non-HDL Cholesterol (Calc): 140 mg/dL (calc) — ABNORMAL HIGH (ref <130)
Total CHOL/HDL Ratio: 6 (calc) — ABNORMAL HIGH (ref <5.0)
Triglycerides: 119 mg/dL (ref <150)

## 2020-05-19 LAB — HEPATITIS C ANTIBODY
Hepatitis C Ab: NONREACTIVE
SIGNAL TO CUT-OFF: 0.02 (ref <1.00)

## 2020-05-19 LAB — HEMOGLOBIN A1C
Hgb A1c MFr Bld: 8.6 % of total Hgb — ABNORMAL HIGH (ref <5.7)
Mean Plasma Glucose: 200 (calc)
eAG (mmol/L): 11.1 (calc)

## 2020-05-19 LAB — CREATININE, URINE, RANDOM: Creatinine, Urine: 213 mg/dL (ref 20–320)

## 2020-05-19 LAB — PROTEIN, URINE, RANDOM: Total Protein, Urine: 25 mg/dL (ref 5–25)

## 2020-05-19 NOTE — Progress Notes (Signed)
Chief complaint: Follow-up for HIV disease  Subjective:    Patient ID: Wayne Wolfe, male    DOB: 21-Aug-1966, 54 y.o.   MRN: [id number]  HIV Positive/AIDS   54 year old man previously perfectly suppressed on Prezista Norvir and Truvada whom I changed over to Atripla and then to Complera to [facility], excellent virological control.   Wayne Wolfe is doing relatively well. He is going to be seen at [facility] re abnormal pap smear.   He has been seen in [facility] clinic recently who are managing his multiple co morbidities including DM     Past Medical History:  Diagnosis Date  . Acute respiratory failure with hypoxia ([place]) 01/12/2019  . Allergy    seasonal  . COVID-19 12/2018  . Dizziness 03/13/2017  . Flu-like symptoms 12/02/2018  . Flu-like symptoms 12/02/2018  . HIV (human immunodeficiency virus infection) ([place]) 2009   11/20/2012 Last CD4 count 210 and VL <20  . 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 Hypertension 2009   Well controlled on HCTZ  . Hypokalemia 01/12/2019  . Lobar pneumonia ([place]) 01/12/2019  . Otitis media 03/13/2017  . Polyuria 03/13/2017  . Poorly controlled diabetes mellitus ([place]) 03/13/2017  . Type 2 diabetes mellitus (HCC)    Well controlled on metformin  . Weight loss 03/13/2017    Past Surgical History:  Procedure Laterality Date  . CARPAL TUNNEL RELEASE Right 1990s  . none      Family History  Problem Relation Age of Onset  . Diabetes Mellitus II Sister   . Colon polyps Sister   . Diabetes Mellitus II Mother   . Coronary artery disease Mother 39       CABGx6  . Coronary artery disease Sister 52       CABG  . Diabetes Mellitus II Sister   . Coronary artery disease Sister   . Diabetes Mellitus II Sister   . Colon cancer Neg Hx   . Esophageal cancer Neg Hx   . Rectal cancer Neg Hx   . Stomach cancer Neg Hx       Social History   Socioeconomic History  . Marital status: Single    Spouse name: Not on file  . Number of children:  Not on file  . Years of education: 63  . Highest education level: Not on file  Occupational History    Employer: UNEMPLOYED  Tobacco Use  . Smoking status: Former Smoker    Types: Cigarettes    Quit date: 2003    Years since quitting: 18.6  . Smokeless tobacco: Never Used  Vaping Use  . Vaping Use: Never used  Substance and Sexual Activity  . Alcohol use: No    Alcohol/week: 0.0 standard drinks    Comment: Last used 2004  . Drug use: No    Comment: Last used 2003  . Sexual activity: Yes    Partners: Male    Comment: offered condoms  Other Topics Concern  . Not on file  Social History Narrative   On disability. Lives in [place].    Social Determinants of Health   Financial Resource Strain:   . Difficulty of Paying Living Expenses: Not on file  Food Insecurity:   . Worried About Charity fundraiser in the Last Year: Not on file  . Ran Out of Food in the Last Year: Not on file  Transportation Needs:   . Lack of Transportation (Medical): Not on file  . Lack of Transportation (  Non-Medical): Not on file  Physical Activity:   . Days of Exercise per Week: Not on file  . Minutes of Exercise per Session: Not on file  Stress:   . Feeling of Stress : Not on file  Social Connections:   . Frequency of Communication with Friends and Family: Not on file  . Frequency of Social Gatherings with Friends and Family: Not on file  . Attends Religious Services: Not on file  . Active Member of Clubs or Organizations: Not on file  . Attends Archivist Meetings: Not on file  . Marital Status: Not on file    No Known Allergies   Current Outpatient Medications:  .  ACCU-CHEK FASTCLIX LANCETS MISC, Check blood sugar before meals and after meals, Disp: 102 each, Rfl: 12 .  acetaminophen (TYLENOL) 500 MG tablet, Take 1,000 mg by mouth every 6 (six) hours as needed for fever., Disp: , Rfl:  .  Blood Glucose Monitoring Suppl (ACCU-CHEK GUIDE) w/Device KIT, 1 each by Does not apply  route 6 (six) times daily., Disp: 1 kit, Rfl: 0 .  chlorhexidine (PERIDEX) 0.12 % solution, , Disp: , Rfl:  .  emtricitabine-rilpivir-tenofovir AF (ODEFSEY) 200-25-25 MG TABS tablet, Take 1 tablet by mouth daily with breakfast., Disp: 30 tablet, Rfl: 3 .  fexofenadine (ALLEGRA) 180 MG tablet, TAKE 1 TABLET(180 MG) BY MOUTH DAILY, Disp: 30 tablet, Rfl: 5 .  glucose blood (ACCU-CHEK GUIDE) test strip, Check blood sugar before meals and after meals, Disp: 100 each, Rfl: 12 .  Insulin Degludec (TRESIBA FLEXTOUCH [place]), Inject 20 Units into the skin at bedtime. Pt states using free sample from Dr [employer] office right now., Disp: , Rfl:  .  Insulin Glargine (BASAGLAR KWIKPEN) 100 UNIT/ML, Inject 0.2 mLs (20 Units total) into the skin daily., Disp: 3 mL, Rfl: 1 .  Insulin Pen Needle (B-D UF III MINI PEN NEEDLES) 31G X 5 MM MISC, USE AS DIRECTED 2 TIMES DAILY TO INJECT INSULIN UNDER THE SKIN, Disp: 200 each, Rfl: 5 .  liraglutide (VICTOZA) 18 MG/3ML SOPN, ADMINISTER 1.2 MG UNDER THE SKIN DAILY, Disp: 18 mL, Rfl: 1 .  metFORMIN (GLUCOPHAGE-XR) 500 MG 24 hr tablet, TAKE 2 TABLETS(1000 MG) BY MOUTH TWICE DAILY, Disp: 360 tablet, Rfl: 1 .  pravastatin (PRAVACHOL) 40 MG tablet, TAKE 1 TABLET BY MOUTH DAILY, Disp: 90 tablet, Rfl: 1 .  lisinopril (ZESTRIL) 20 MG tablet, Take 1 tablet (20 mg total) by mouth daily., Disp: 90 tablet, Rfl: 1    Review of Systems  Constitutional: Negative for activity change, appetite change, chills, diaphoresis, fatigue and fever.  HENT: Negative for congestion, rhinorrhea, sinus pressure, sinus pain, sneezing, sore throat and trouble swallowing.   Eyes: Negative for photophobia and visual disturbance.  Respiratory: Negative for cough, chest tightness, shortness of breath, wheezing and stridor.   Cardiovascular: Negative for chest pain, palpitations and leg swelling.  Gastrointestinal: Negative for abdominal distention, abdominal pain, anal bleeding, blood in stool, constipation,  diarrhea, nausea and vomiting.  Genitourinary: Negative for difficulty urinating, dysuria, flank pain and hematuria.  Musculoskeletal: Negative for arthralgias, back pain, gait problem, joint swelling and myalgias.  Skin: Negative for color change, pallor, rash and wound.  Neurological: Negative for tremors, weakness and light-headedness.  Hematological: Negative for adenopathy. Does not bruise/bleed easily.  Psychiatric/Behavioral: Negative for agitation, behavioral problems, confusion, dysphoric mood and sleep disturbance.       Objective:   Physical Exam Constitutional:      General: He is not in  acute distress.    Appearance: He is well-developed. He is not diaphoretic.  HENT:     Head: Normocephalic and atraumatic.     Right Ear: Swelling present. A middle ear effusion is present. Tympanic membrane is erythematous.     Left Ear: A middle ear effusion is present.     Nose: No rhinorrhea.     Mouth/Throat:     Pharynx: No oropharyngeal exudate.  Eyes:     General: No scleral icterus.    Extraocular Movements: Extraocular movements intact.     Conjunctiva/sclera: Conjunctivae normal.     Pupils: Pupils are equal, round, and reactive to light.  Neck:     Vascular: No JVD.  Cardiovascular:     Rate and Rhythm: Normal rate.     Heart sounds: Normal heart sounds. No murmur heard.  No friction rub. No gallop.   Pulmonary:     Effort: Pulmonary effort is normal. No respiratory distress.     Breath sounds: Normal breath sounds. No stridor. No wheezing, rhonchi or rales.  Chest:     Chest wall: No tenderness.  Abdominal:     General: Bowel sounds are normal. There is no distension.  Musculoskeletal:        General: No tenderness.     Cervical back: Normal range of motion and neck supple.  Lymphadenopathy:     Cervical: No cervical adenopathy.  Skin:    General: Skin is warm and dry.     Coloration: Skin is not pale.     Findings: No erythema.  Neurological:     Mental  Status: He is alert and oriented to person, place, and time.     Motor: No abnormal muscle tone.     Coordination: Coordination normal.  Psychiatric:        Mood and Affect: Mood normal.        Behavior: Behavior normal.        Thought Content: Thought content normal.        Judgment: Judgment normal.           Assessment & Plan:   HIV: continue ODEFSEY he is renewed his HMA P we can do this again in January  DM: following in IM clinic  HTN: followed by IM clinic : Vitals:   05/19/20 0904  BP: 133/86  Pulse: 84  SpO2: 96%    Hyperlipidemia: not at goal currently.  Lipid Panel     Component Value Date/Time   CHOL 168 05/18/2020 0908   TRIG 119 05/18/2020 0908   HDL 28 (L) 05/18/2020 0908   CHOLHDL 6.0 (H) 05/18/2020 0908   VLDL 18 01/13/2019 0442   LDLCALC 117 (H) 05/18/2020 0908    Abnormal pap: going to be seen at [facility]. WOuld be a good idea for him to be in [place] study at [facility] as well  COVID prevention: he had COVID 19 infection before and also now vaccinated

## 2020-07-12 ENCOUNTER — Ambulatory Visit (INDEPENDENT_AMBULATORY_CARE_PROVIDER_SITE_OTHER): Payer: Self-pay | Admitting: Student

## 2020-07-12 ENCOUNTER — Encounter: Payer: Self-pay | Admitting: Student

## 2020-07-12 ENCOUNTER — Other Ambulatory Visit: Payer: Self-pay

## 2020-07-12 VITALS — BP 121/83 | HR 79 | Temp 97.9°F | Wt 250.1 lb

## 2020-07-12 DIAGNOSIS — Z23 Encounter for immunization: Secondary | ICD-10-CM

## 2020-07-12 DIAGNOSIS — Z0000 Encounter for general adult medical examination without abnormal findings: Secondary | ICD-10-CM

## 2020-07-12 DIAGNOSIS — E1165 Type 2 diabetes mellitus with hyperglycemia: Secondary | ICD-10-CM

## 2020-07-12 DIAGNOSIS — K6282 Dysplasia of anus: Secondary | ICD-10-CM

## 2020-07-12 DIAGNOSIS — I152 Hypertension secondary to endocrine disorders: Secondary | ICD-10-CM

## 2020-07-12 DIAGNOSIS — E1159 Type 2 diabetes mellitus with other circulatory complications: Secondary | ICD-10-CM

## 2020-07-12 MED ORDER — BD PEN NEEDLE MINI U/F 31G X 5 MM MISC: 5 refills | Status: DC

## 2020-07-12 MED ORDER — TRESIBA FLEXTOUCH 200 UNIT/ML ~~LOC~~ SOPN: 20.0000 [IU] | PEN_INJECTOR | Freq: Every evening | SUBCUTANEOUS | 3 refills | Status: DC

## 2020-07-12 MED ORDER — EMPAGLIFLOZIN 10 MG PO TABS: 10.0000 mg | ORAL_TABLET | Freq: Every day | ORAL | 3 refills | Status: DC

## 2020-07-12 NOTE — Assessment & Plan Note (Signed)
Was originally scheduled for EUA with possible ablation on 05/20/2020 but had to canceled due to lapse in insurance. Will revisit once patient is able to re enroll for insurance.

## 2020-07-12 NOTE — Assessment & Plan Note (Signed)
BP well controled at 121/83 today. Will continue on lisinopril 20 mg daily

## 2020-07-12 NOTE — Patient Instructions (Signed)
It was a pleasure seeing you in clinic. Today we discussed:   Diabetes: Your blood glucose has been elevated so I have added a new medication Jardiance 10 mg daily in addition to you metformin, insulin, and Victoza.  I have also refilled your insulin and pen needles as well.  Please follow up in 1-2 months to monitor how your diabetes is doing at that time.   If you have any questions or concerns, please call our clinic at [phone] between 9am-5pm and after hours call [phone] and ask for the internal medicine resident on call. If you feel you are having a medical emergency please call 911.   Thank you, we look forward to helping you remain healthy!

## 2020-07-12 NOTE — Assessment & Plan Note (Signed)
Patient administered flu vaccine today.

## 2020-07-12 NOTE — Progress Notes (Signed)
   CC: Diabetes follow up  HPI:  [name] is a 54 y.o. male with history listed below. Presents for follow up of diabetes. Please refer to problem based charting for further details and assessment and plan of current problem and chronic medical conditions.   Past Medical History:  Diagnosis Date  . Acute respiratory failure with hypoxia ([place]) 01/12/2019  . Allergy    seasonal  . COVID-19 12/2018  . Dizziness 03/13/2017  . Flu-like symptoms 12/02/2018  . Flu-like symptoms 12/02/2018  . HIV (human immunodeficiency virus infection) ([place]) 2009   11/20/2012 Last CD4 count 210 and VL <20  . 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 Hypertension 2009   Well controlled on HCTZ  . Hypokalemia 01/12/2019  . Lobar pneumonia ([place]) 01/12/2019  . Otitis media 03/13/2017  . Polyuria 03/13/2017  . Poorly controlled diabetes mellitus ([place]) 03/13/2017  . Type 2 diabetes mellitus (HCC)    Well controlled on metformin  . Weight loss 03/13/2017   Review of Systems:  Negative as per HPI  Physical Exam:  Vitals:   07/12/20 0840  BP: 121/83  Pulse: 79  Temp: 97.9 F (36.6 C)  TempSrc: Oral  SpO2: 100%  Weight: 250 lb 1.6 oz (113.4 kg)   Physical Exam Constitutional:      Appearance: He is obese.  HENT:     Head: Normocephalic and atraumatic.  Eyes:     Extraocular Movements: Extraocular movements intact.     Pupils: Pupils are equal, round, and reactive to light.  Cardiovascular:     Rate and Rhythm: Normal rate and regular rhythm.     Pulses: Normal pulses.     Heart sounds: Normal heart sounds.  Pulmonary:     Effort: Pulmonary effort is normal.     Breath sounds: Normal breath sounds.  Musculoskeletal:     Right lower leg: No edema.     Left lower leg: No edema.  Skin:    Capillary Refill: Capillary refill takes less than 2 seconds.  Neurological:     General: No focal deficit present.     Mental Status: He is alert and oriented to person, place, and time.       Assessment & Plan:   See Encounters Tab for problem based charting.  Patient seen with Dr. Evette Doffing

## 2020-07-12 NOTE — Assessment & Plan Note (Addendum)
Last A1c 8.6 on 05/18/2020 increase from 7.6 during last visit. He checks his blood glucose prior to breakfast and states they range between 140s and 160s lately. States he has been eating more food than before. Reports compliance with insulin, victoza and metformin. His blood sugar continues to trend upwards, suspect he has elevated post prandial blood sugars. Will start on Jardiance 10 mg daily today. Review risk and benefits of medication and he is agreeable to starting new medication.  Plan - Continue metformin 1000 mg twice daily, tresiba 20 units nightly, victoza 18 mg daily - Start Jardiance 10 mg daily - Follow up in 1 -2 months for A1c recheck - Consider short acting meal time insulin and/ CBG monitoring  If diabetes continues to be difficult to control.

## 2020-07-13 NOTE — Progress Notes (Signed)
Internal Medicine Clinic Attending  I saw and evaluated the patient.  I personally confirmed the key portions of the history and exam documented by Dr. Liang and I reviewed pertinent patient test results.  The assessment, diagnosis, and plan were formulated together and I agree with the documentation in the resident's note.  

## 2020-08-09 ENCOUNTER — Other Ambulatory Visit: Payer: Self-pay | Admitting: Infectious Disease

## 2020-08-09 DIAGNOSIS — E1169 Type 2 diabetes mellitus with other specified complication: Secondary | ICD-10-CM

## 2020-09-01 ENCOUNTER — Other Ambulatory Visit: Payer: Self-pay

## 2020-09-01 DIAGNOSIS — I152 Hypertension secondary to endocrine disorders: Secondary | ICD-10-CM

## 2020-09-01 DIAGNOSIS — E1159 Type 2 diabetes mellitus with other circulatory complications: Secondary | ICD-10-CM

## 2020-09-01 MED ORDER — LISINOPRIL 20 MG PO TABS: 20.0000 mg | ORAL_TABLET | Freq: Every day | ORAL | 1 refills | Status: DC

## 2020-09-01 NOTE — Telephone Encounter (Signed)
lisinopril (ZESTRIL) 20 MG tablet(Expired, REFILL REQUEST @  [id number] - [place], [place] [place] Phone:  [phone]  Fax:  [phone]

## 2020-10-03 ENCOUNTER — Ambulatory Visit (INDEPENDENT_AMBULATORY_CARE_PROVIDER_SITE_OTHER): Payer: Self-pay | Admitting: Student

## 2020-10-03 ENCOUNTER — Encounter: Payer: Self-pay | Admitting: Student

## 2020-10-03 ENCOUNTER — Other Ambulatory Visit: Payer: Self-pay

## 2020-10-03 VITALS — BP 130/86 | HR 98 | Wt 254.4 lb

## 2020-10-03 DIAGNOSIS — E1159 Type 2 diabetes mellitus with other circulatory complications: Secondary | ICD-10-CM

## 2020-10-03 DIAGNOSIS — E1165 Type 2 diabetes mellitus with hyperglycemia: Secondary | ICD-10-CM

## 2020-10-03 DIAGNOSIS — Z0000 Encounter for general adult medical examination without abnormal findings: Secondary | ICD-10-CM

## 2020-10-03 DIAGNOSIS — R202 Paresthesia of skin: Secondary | ICD-10-CM

## 2020-10-03 DIAGNOSIS — R200 Anesthesia of skin: Secondary | ICD-10-CM

## 2020-10-03 DIAGNOSIS — I152 Hypertension secondary to endocrine disorders: Secondary | ICD-10-CM

## 2020-10-03 LAB — POCT GLYCOSYLATED HEMOGLOBIN (HGB A1C): Hemoglobin A1C: 9.8 % — AB (ref 4.0–5.6)

## 2020-10-03 LAB — GLUCOSE, CAPILLARY: Glucose-Capillary: 227 mg/dL — ABNORMAL HIGH (ref 70–99)

## 2020-10-03 MED ORDER — TRESIBA FLEXTOUCH 200 UNIT/ML ~~LOC~~ SOPN: 25.0000 [IU] | PEN_INJECTOR | Freq: Every evening | SUBCUTANEOUS | 3 refills | Status: DC

## 2020-10-03 MED ORDER — GABAPENTIN 100 MG PO CAPS: 100.0000 mg | ORAL_CAPSULE | Freq: Three times a day (TID) | ORAL | 2 refills | Status: DC

## 2020-10-03 NOTE — Progress Notes (Signed)
   CC: Diabetes follow up  HPI:  Wayne Wolfe is a 55 y.o. his history listed below presents for follow up of his diabetes. Patent tried Jardiance for a couple of week but stopped due to altered smell of his urine. Also complaining of bilateral toe numbness and tingling. Please refer to problem based charting for further details and assessment and plan of current problem and chronic medical conditions.  Past Medical History:  Diagnosis Date  . Acute respiratory failure with hypoxia (HCC) 01/12/2019  . Allergy    seasonal  . COVID-19 12/2018  . Dizziness 03/13/2017  . Flu-like symptoms 12/02/2018  . Flu-like symptoms 12/02/2018  . HIV (human immunodeficiency virus infection) (HCC) 2009   11/20/2012 Last CD4 count 210 and VL <20  . 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 Hypertension 2009   Well controlled on HCTZ  . Hypokalemia 01/12/2019  . Lobar pneumonia (HCC) 01/12/2019  . Otitis media 03/13/2017  . Polyuria 03/13/2017  . Poorly controlled diabetes mellitus (HCC) 03/13/2017  . Type 2 diabetes mellitus (HCC)    Well controlled on metformin  . Weight loss 03/13/2017   Review of Systems:  Negative as per HPI.   Physical Exam:  Vitals:   10/03/20 0907  BP: 130/86  Pulse: 98  SpO2: 99%  Weight: 254 lb 6.4 oz (115.4 kg)  Physical Exam Constitutional:      Appearance: Normal appearance. He is obese.  HENT:     Head: Normocephalic and atraumatic.     Mouth/Throat:     Mouth: Mucous membranes are moist.     Pharynx: Oropharynx is clear.  Eyes:     Extraocular Movements: Extraocular movements intact.     Pupils: Pupils are equal, round, and reactive to light.  Cardiovascular:     Rate and Rhythm: Normal rate and regular rhythm.  Pulmonary:     Effort: Pulmonary effort is normal.     Breath sounds: Normal breath sounds. No wheezing.  Abdominal:     General: Abdomen is flat. Bowel sounds are normal.     Palpations: Abdomen is soft.  Musculoskeletal:      Right lower leg: No edema.     Left lower leg: No edema.  Skin:    General: Skin is warm and dry.     Capillary Refill: Capillary refill takes less than 2 seconds.  Neurological:     Mental Status: He is alert and oriented to person, place, and time. Mental status is at baseline.     Assessment & Plan:   See Encounters Tab for problem based charting.  Patient discussed with Dr. Antony Contras

## 2020-10-03 NOTE — Addendum Note (Signed)
Addended by: Burnell Blanks on: 10/03/2020 01:39 PM   Modules accepted: Level of Service

## 2020-10-03 NOTE — Patient Instructions (Addendum)
It was a pleasure seeing you in clinic. Today we discussed:   Diabetes: Your A1c today was 9.8 which in increased from our last visit. It is very important to keep you blood sugar controlled. You diabetes is likely causing pain in your feet and keeping it better controlled will help prevent it from becoming worse.  Please stop taking Jardiance. Increase you tresiba to 25 units every night. Please measure you blood sugar every morning before eating. Try setting an alarm on your phone to help remember. You can also consider check your blood sugar 30 minutes before each meal in addition to checking in the morning. Continue with watching you diet and exercising as much as possible.  I have also made a referral for you to have your eyes checked.   Please make a telehealth appointment in 1-2 weeks to follow up on your diabetes.  Foot numbness:  Please start gabapentin 100 mg 3 times day. If you notice symptoms are worse at night you can try taking all 300 mg at night rather than 3 times a day. Please be aware this medication can cause sleepiness. I have also ordered a B12 level and will let you know if the results are abnormal.    If you have any questions or concerns, please call our clinic at [phone] between 9am-5pm and after hours call [phone] and ask for the internal medicine resident on call. If you feel you are having a medical emergency please call 911.   Thank you, we look forward to helping you remain healthy!

## 2020-10-03 NOTE — Assessment & Plan Note (Signed)
Remains well controlled continue present management.

## 2020-10-03 NOTE — Assessment & Plan Note (Addendum)
A1c today up to 9.8 from 8.6 in October 2021. Patient states over Thanksgiving, Christmas, and [place] Years he has been splurging more and not been as mindful of diet or exercise. States he started [place] for a couple of weeks. Noticed that his urine with a different smell than usual and was concerned he had a fungal infection. This resolved after stopping the medication, he did not seek any medical care for this. He denies fever, chills, dysuria, hematuria, drainage, or rashes during the time he was on the medication as well as today. Unclear if his symptoms are medication side effect vs UTI, however he is asymptomatic today, explained this to the patient. He would like to stay off the Jardiance due to the symptoms. States he continues with his metformin, tresiba, and victoza and denies missing doses. He checks this in the afternoon which are generally between 190-210. Advised he check his blood glucose in the morning before breakfast. Notes he has not had an eye exam in a while and would like a referral. Denies polyuria, polydipsia, or blurred vision.  Suspect diabetes is poorly controlled due to diet indiscretion over the holidays. Have had difficulty getting the patient to consistently monitor blood glucose at consistent times, suggested CBG monitoring. He states sister has this and would prefer to continue with his glucose monitor for now. Emphasized imputative of glycemic control in preventing complications from diabetes. Discussed diet modifications, previously talked with diabetic coordinator and feels he has a good understanding of what he should do but has lapsed due to the holidays. Offered additional diabetic counseling which he is not interested in at this time.   Plan - Increase tresiba to 25 units nightly - Stop Jardiance - Continue meformin and victoza - Referral to ophthalmology - Telehealth visit in 1-2 weeks to follow up diabetes

## 2020-10-03 NOTE — Progress Notes (Signed)
Internal Medicine Clinic Attending ? ?Case discussed with Dr. Liang  At the time of the visit.  We reviewed the resident?s history and exam and pertinent patient test results.  I agree with the assessment, diagnosis, and plan of care documented in the resident?s note. ? ?

## 2020-10-03 NOTE — Assessment & Plan Note (Signed)
Patient notes numbness and tingling of toes of both feet. States symptoms vary through the day with no clear pattern and recently have been more bothersome to him. Describes as mostly pina and needles in toes. Likely his pain is secondary to diabetic neuropathy but will also check B12 levels today. Patient interested in trying a medication to help with this issue. Discussed he can take gabapentin 3 times a day or at night if symptoms are worse at night. Warned that medication make him drowsy and to be careful with driving or working while taking this.  Plan - Gabapentin 100 mg 3 times a day / 300 mg at night - B12 levels

## 2020-10-03 NOTE — Assessment & Plan Note (Signed)
Plans on getting COVID booster at next ID visit

## 2020-10-04 LAB — VITAMIN B12: Vitamin B-12: 278 pg/mL (ref 232–1245)

## 2020-10-05 ENCOUNTER — Other Ambulatory Visit: Payer: Self-pay

## 2020-10-05 DIAGNOSIS — B20 Human immunodeficiency virus [HIV] disease: Secondary | ICD-10-CM

## 2020-10-06 LAB — T-HELPER CELL (CD4) - (RCID CLINIC ONLY)
CD4 % Helper T Cell: 22 % — ABNORMAL LOW (ref 33–65)
CD4 T Cell Abs: 455 /uL (ref 400–1790)

## 2020-10-07 ENCOUNTER — Ambulatory Visit (INDEPENDENT_AMBULATORY_CARE_PROVIDER_SITE_OTHER): Payer: Self-pay

## 2020-10-07 ENCOUNTER — Other Ambulatory Visit: Payer: Self-pay

## 2020-10-07 DIAGNOSIS — Z23 Encounter for immunization: Secondary | ICD-10-CM

## 2020-10-07 NOTE — Progress Notes (Signed)
   Covid-19 Vaccination Clinic  Name:  Wayne Wolfe    MRN: [id number] DOB: January 02, 1966  10/07/2020  Wayne Wolfe was observed post Covid-19 immunization for 15 minutes without incident. He was provided with Vaccine Information Sheet and instruction to access the V-Safe system.   Wayne Wolfe was instructed to call 911 with any severe reactions post vaccine: Marland Kitchen Difficulty breathing  . Swelling of face and throat  . A fast heartbeat  . A bad rash all over body  . Dizziness and weakness   Immunizations Administered    Name Date Dose VIS Date Route   Pfizer COVID-19 Vaccine 10/07/2020  9:42 AM 0.3 mL 07/20/2020 Intramuscular   Manufacturer: [facility]   Lot: [id number]   NDC: [place], [place]

## 2020-10-11 NOTE — Addendum Note (Signed)
Addended by: Hulan Fray on: 10/11/2020 06:12 PM   Modules accepted: Orders

## 2020-10-12 LAB — COMPLETE METABOLIC PANEL WITH GFR
AG Ratio: 1.6 (calc) (ref 1.0–2.5)
ALT: 18 U/L (ref 9–46)
AST: 11 U/L (ref 10–35)
Albumin: 4.3 g/dL (ref 3.6–5.1)
Alkaline phosphatase (APISO): 88 U/L (ref 35–144)
BUN: 9 mg/dL (ref 7–25)
CO2: 30 mmol/L (ref 20–32)
Calcium: 9.5 mg/dL (ref 8.6–10.3)
Chloride: 101 mmol/L (ref 98–110)
Creat: 1.19 mg/dL (ref 0.70–1.33)
GFR, Est African American: 79 mL/min/{1.73_m2} (ref >=60)
GFR, Est Non African American: 68 mL/min/{1.73_m2} (ref >=60)
Globulin: 2.7 g/dL (calc) (ref 1.9–3.7)
Glucose, Bld: 223 mg/dL — ABNORMAL HIGH (ref 65–99)
Potassium: 4.7 mmol/L (ref 3.5–5.3)
Sodium: 138 mmol/L (ref 135–146)
Total Bilirubin: 0.4 mg/dL (ref 0.2–1.2)
Total Protein: 7 g/dL (ref 6.1–8.1)

## 2020-10-12 LAB — CBC WITH DIFFERENTIAL/PLATELET
Absolute Monocytes: 413 cells/uL (ref 200–950)
Basophils Absolute: 69 cells/uL (ref 0–200)
Basophils Relative: 1.6 %
Eosinophils Absolute: 99 cells/uL (ref 15–500)
Eosinophils Relative: 2.3 %
HCT: 51.5 % — ABNORMAL HIGH (ref 38.5–50.0)
Hemoglobin: 17 g/dL (ref 13.2–17.1)
Lymphs Abs: 2249 cells/uL (ref 850–3900)
MCH: 29.7 pg (ref 27.0–33.0)
MCHC: 33 g/dL (ref 32.0–36.0)
MCV: 90 fL (ref 80.0–100.0)
MPV: 9.8 fL (ref 7.5–12.5)
Monocytes Relative: 9.6 %
Neutro Abs: 1471 cells/uL — ABNORMAL LOW (ref 1500–7800)
Neutrophils Relative %: 34.2 %
Platelets: 307 10*3/uL (ref 140–400)
RBC: 5.72 10*6/uL (ref 4.20–5.80)
RDW: 13.2 % (ref 11.0–15.0)
Total Lymphocyte: 52.3 %
WBC: 4.3 10*3/uL (ref 3.8–10.8)

## 2020-10-12 LAB — HIV-1 RNA QUANT-NO REFLEX-BLD
HIV 1 RNA Quant: <20 Copies/mL
HIV-1 RNA Quant, Log: <1.3 Log cps/mL

## 2020-10-19 ENCOUNTER — Other Ambulatory Visit: Payer: Self-pay

## 2020-10-19 ENCOUNTER — Encounter: Payer: Self-pay | Admitting: Infectious Disease

## 2020-10-19 ENCOUNTER — Ambulatory Visit (INDEPENDENT_AMBULATORY_CARE_PROVIDER_SITE_OTHER): Payer: Self-pay | Admitting: Infectious Disease

## 2020-10-19 VITALS — BP 135/86 | HR 99 | Temp 98.3°F | Ht 67.0 in | Wt 250.0 lb

## 2020-10-19 DIAGNOSIS — E669 Obesity, unspecified: Secondary | ICD-10-CM

## 2020-10-19 DIAGNOSIS — Z8616 Personal history of COVID-19: Secondary | ICD-10-CM

## 2020-10-19 DIAGNOSIS — Z79899 Other long term (current) drug therapy: Secondary | ICD-10-CM

## 2020-10-19 DIAGNOSIS — E785 Hyperlipidemia, unspecified: Secondary | ICD-10-CM

## 2020-10-19 DIAGNOSIS — E1165 Type 2 diabetes mellitus with hyperglycemia: Secondary | ICD-10-CM

## 2020-10-19 DIAGNOSIS — E1169 Type 2 diabetes mellitus with other specified complication: Secondary | ICD-10-CM

## 2020-10-19 DIAGNOSIS — Z6838 Body mass index (BMI) 38.0-38.9, adult: Secondary | ICD-10-CM

## 2020-10-19 DIAGNOSIS — Z113 Encounter for screening for infections with a predominantly sexual mode of transmission: Secondary | ICD-10-CM

## 2020-10-19 DIAGNOSIS — B20 Human immunodeficiency virus [HIV] disease: Secondary | ICD-10-CM

## 2020-10-19 DIAGNOSIS — A539 Syphilis, unspecified: Secondary | ICD-10-CM

## 2020-10-19 DIAGNOSIS — G4733 Obstructive sleep apnea (adult) (pediatric): Secondary | ICD-10-CM | POA: Insufficient documentation

## 2020-10-19 HISTORY — DX: Obesity, unspecified: E66.9

## 2020-10-19 HISTORY — DX: Obstructive sleep apnea (adult) (pediatric): G47.33

## 2020-10-19 NOTE — Progress Notes (Signed)
Chief complaint: Concern he might have an STI as he stated a condom broke  Subjective:    Patient ID: Wayne Wolfe, male    DOB: April 09, 1966, 55 y.o.   MRN: [id number]  HIV Positive/AIDS   55 year old man previously perfectly suppressed on Prezista Norvir and Truvada whom I changed over to Atripla and then to Complera to Wayne Wolfe, excellent virological control.   Wayne Wolfe is doing relatively well.    He has been seen in IM [facility] clinic recently who are managing his multiple co morbidities including DM  We reviewed all of his labs and I did notice that his hematocrit was a bit elevated and upon questioning it does sound like at times he wakes up at night gasping for air though not frequently.  He also occasionally has daytime somnolence and I suspect he has obstructive sleep apnea.     Past Medical History:  Diagnosis Date  . Acute respiratory failure with hypoxia ([place]) 01/12/2019  . Allergy    seasonal  . COVID-19 12/2018  . Dizziness 03/13/2017  . Flu-like symptoms 12/02/2018  . Flu-like symptoms 12/02/2018  . HIV (human immunodeficiency virus infection) ([place]) 2009   11/20/2012 Last CD4 count 210 and VL <20  . 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 Hypertension 2009   Well controlled on HCTZ  . Hypokalemia 01/12/2019  . Lobar pneumonia ([place]) 01/12/2019  . Obesity 10/19/2020  . Obstructive sleep apnea 10/19/2020  . Otitis media 03/13/2017  . Polyuria 03/13/2017  . Poorly controlled diabetes mellitus ([place]) 03/13/2017  . Type 2 diabetes mellitus (HCC)    Well controlled on metformin  . Weight loss 03/13/2017    Past Surgical History:  Procedure Laterality Date  . CARPAL TUNNEL RELEASE Right 1990s  . none      Family History  Problem Relation Age of Onset  . Diabetes Mellitus II Sister   . Colon polyps Sister   . Diabetes Mellitus II Mother   . Coronary artery disease Mother 56       CABGx6  . Coronary artery disease Sister 22       CABG  . Diabetes Mellitus  II Sister   . Coronary artery disease Sister   . Diabetes Mellitus II Sister   . Colon cancer Neg Hx   . Esophageal cancer Neg Hx   . Rectal cancer Neg Hx   . Stomach cancer Neg Hx       Social History   Socioeconomic History  . Marital status: Single    Spouse name: Not on file  . Number of children: Not on file  . Years of education: 23  . Highest education level: Not on file  Occupational History    Employer: UNEMPLOYED  Tobacco Use  . Smoking status: Former Smoker    Types: Cigarettes    Quit date: 2003    Years since quitting: 19.0  . Smokeless tobacco: Never Used  Vaping Use  . Vaping Use: Never used  Substance and Sexual Activity  . Alcohol use: No    Alcohol/week: 0.0 standard drinks    Comment: Last used 2004  . Drug use: No    Comment: Last used 2003  . Sexual activity: Yes    Partners: Male    Comment: offered condoms  Other Topics Concern  . Not on file  Social History Narrative   On disability. Lives in [place].    Social Determinants of Health   Financial Resource Strain: Not on  file  Food Insecurity: Not on file  Transportation Needs: Not on file  Physical Activity: Not on file  Stress: Not on file  Social Connections: Not on file    No Known Allergies   Current Outpatient Medications:  .  ACCU-CHEK FASTCLIX LANCETS MISC, Check blood sugar before meals and after meals, Disp: 102 each, Rfl: 12 .  acetaminophen (TYLENOL) 500 MG tablet, Take 1,000 mg by mouth every 6 (six) hours as needed for fever., Disp: , Rfl:  .  Blood Glucose Monitoring Suppl (ACCU-CHEK GUIDE) w/Device KIT, 1 each by Does not apply route 6 (six) times daily., Disp: 1 kit, Rfl: 0 .  emtricitabine-rilpivir-tenofovir AF (ODEFSEY) 200-25-25 MG TABS tablet, Take 1 tablet by mouth daily with breakfast., Disp: 30 tablet, Rfl: 3 .  fexofenadine (ALLEGRA) 180 MG tablet, TAKE 1 TABLET(180 MG) BY MOUTH DAILY, Disp: 30 tablet, Rfl: 5 .  gabapentin (NEURONTIN) 100 MG capsule, Take 1  capsule (100 mg total) by mouth 3 (three) times daily., Disp: 90 capsule, Rfl: 2 .  glucose blood (ACCU-CHEK GUIDE) test strip, Check blood sugar before meals and after meals, Disp: 100 each, Rfl: 12 .  insulin degludec (TRESIBA FLEXTOUCH) 200 UNIT/ML FlexTouch Pen, Inject 26 Units into the skin at bedtime. Pt states using free sample from Wayne Wolfe's office right now., Disp: 3 mL, Rfl: 3 .  Insulin Pen Needle (B-D UF III MINI PEN NEEDLES) 31G X 5 MM MISC, USE AS DIRECTED 2 TIMES DAILY TO INJECT INSULIN UNDER THE SKIN, Disp: 200 each, Rfl: 5 .  liraglutide (VICTOZA) 18 MG/3ML SOPN, ADMINISTER 1.2 MG UNDER THE SKIN DAILY, Disp: 18 mL, Rfl: 1 .  lisinopril (ZESTRIL) 20 MG tablet, Take 1 tablet (20 mg total) by mouth daily., Disp: 90 tablet, Rfl: 1 .  metFORMIN (GLUCOPHAGE-XR) 500 MG 24 hr tablet, TAKE 2 TABLETS(1000 MG) BY MOUTH TWICE DAILY, Disp: 360 tablet, Rfl: 1 .  pravastatin (PRAVACHOL) 40 MG tablet, TAKE 1 TABLET BY MOUTH DAILY, Disp: 90 tablet, Rfl: 0    Review of Systems  Constitutional: Negative for activity change, appetite change, chills, diaphoresis, fatigue and fever.  HENT: Negative for congestion, rhinorrhea, sinus pressure, sinus pain, sneezing, sore throat and trouble swallowing.   Eyes: Negative for photophobia and visual disturbance.  Respiratory: Negative for cough, chest tightness, shortness of breath, wheezing and stridor.   Cardiovascular: Negative for chest pain, palpitations and leg swelling.  Gastrointestinal: Negative for abdominal distention, abdominal pain, anal bleeding, blood in stool, constipation, diarrhea, nausea and vomiting.  Genitourinary: Negative for difficulty urinating, dysuria, flank pain and hematuria.  Musculoskeletal: Negative for arthralgias, back pain, gait problem, joint swelling and myalgias.  Skin: Negative for color change, pallor, rash and wound.  Neurological: Negative for tremors, weakness and light-headedness.  Hematological: Negative for  adenopathy. Does not bruise/bleed easily.  Psychiatric/Behavioral: Negative for agitation, behavioral problems, confusion, dysphoric mood and sleep disturbance.       Objective:   Physical Exam Constitutional:      General: He is not in acute distress.    Appearance: He is well-developed. He is not diaphoretic.  HENT:     Head: Normocephalic and atraumatic.     Nose: No rhinorrhea.     Mouth/Throat:     Pharynx: No oropharyngeal exudate.  Eyes:     General: No scleral icterus.    Extraocular Movements: Extraocular movements intact.     Conjunctiva/sclera: Conjunctivae normal.     Pupils: Pupils are equal, round, and reactive to light.  Neck:     Vascular: No JVD.  Cardiovascular:     Rate and Rhythm: Normal rate.     Heart sounds: Normal heart sounds. No murmur heard. No friction rub. No gallop.   Pulmonary:     Effort: Pulmonary effort is normal. No respiratory distress.     Breath sounds: Normal breath sounds. No stridor. No wheezing, rhonchi or rales.  Chest:     Chest wall: No tenderness.  Abdominal:     General: Bowel sounds are normal. There is no distension.  Musculoskeletal:        General: No tenderness.     Cervical back: Normal range of motion and neck supple.  Lymphadenopathy:     Cervical: No cervical adenopathy.  Skin:    General: Skin is warm and dry.     Coloration: Skin is not pale.     Findings: No erythema.  Neurological:     General: No focal deficit present.     Mental Status: He is alert and oriented to person, place, and time.     Motor: No abnormal muscle tone.     Coordination: Coordination normal.  Psychiatric:        Mood and Affect: Mood normal.        Behavior: Behavior normal.        Thought Content: Thought content normal.        Judgment: Judgment normal.           Assessment & Plan:   HIV: Continue Odefsey renew SPAP return to clinic in 6 months  OSA: SPECT he has obstructive sleep apnea would benefit them a sleep study.   He inferred that this something that might be in the works from his primary care doctor.  DM: following in IM clinic  HTN: followed by IM clinic  Vitals:   10/19/20 1337  BP: 135/86  Pulse: 99  Temp: 98.3 F (36.8 C)  SpO2: 96%   STI screening with screening for gonorrhea chlamydia genital extragenital sites also testing for syphilis.  Hyperlipidemia: not at goal currently. Following at [facility]  Abnormal XLL:IYIY at [place] prevention: he had COVID 19 infection before and also now vaccinated including booster

## 2020-10-19 NOTE — Progress Notes (Signed)
Patient orders were released. Patient only had RPR drawn and urine and swabs collected.  Orders re-entered for future lab appointment. Wayne Wolfe

## 2020-10-20 LAB — CYTOLOGY, (ORAL, ANAL, URETHRAL) ANCILLARY ONLY
Chlamydia: NEGATIVE
Chlamydia: NEGATIVE
Comment: NEGATIVE
Comment: NEGATIVE
Comment: NORMAL
Comment: NORMAL
Neisseria Gonorrhea: NEGATIVE
Neisseria Gonorrhea: NEGATIVE

## 2020-10-20 LAB — URINE CYTOLOGY ANCILLARY ONLY
Chlamydia: NEGATIVE
Comment: NEGATIVE
Comment: NORMAL
Neisseria Gonorrhea: NEGATIVE

## 2020-10-20 LAB — RPR TITER: RPR Titer: 1:2 {titer} — ABNORMAL HIGH

## 2020-10-20 LAB — RPR: RPR Ser Ql: REACTIVE — AB

## 2020-10-20 LAB — FLUORESCENT TREPONEMAL AB(FTA)-IGG-BLD: Fluorescent Treponemal ABS: REACTIVE — AB

## 2020-10-25 ENCOUNTER — Ambulatory Visit: Payer: Medicaid Other | Admitting: Student

## 2020-10-25 ENCOUNTER — Other Ambulatory Visit: Payer: Self-pay

## 2020-10-25 DIAGNOSIS — E1165 Type 2 diabetes mellitus with hyperglycemia: Secondary | ICD-10-CM

## 2020-10-25 NOTE — Progress Notes (Signed)
   CC: Diabetes  This is a telephone encounter between [employer] and Wayne Wolfe on 10/25/2020 for diabetes follow up. The visit was conducted with the patient located at sister's house and Wayne Wolfe at [facility]. The patient's identity was confirmed using their DOB and current address. The patient has consented to being evaluated through a telephone encounter and understands the associated risks (an examination cannot be done and the patient may need to come in for an appointment) / benefits (allows the patient to remain at home, decreasing exposure to coronavirus). I personally spent 10 minutes on medical discussion.   HPI:  [name] is a 55 y.o. with PMH as below. Patient scheduled for telehealth visit to follow up for uncontrolled diabetes and recent elevations in A1c. Unfortunately patient is at sisters house today, does not have his glucose meter, and has not yet titrated his tresiba up to 25 units daily as discussed at this office visit on 10/03/2020. Reiterated he makes these changes and monitor glucose in the mornings and 30 minutes before meals. Will have his reschedule for a telehealth visit  in 1 -2 weeks once he has a chance to make these changes and further review glucose meter and diabetes at that time.   Past Medical History:  Diagnosis Date  . Acute respiratory failure with hypoxia ([place]) 01/12/2019  . Allergy    seasonal  . COVID-19 12/2018  . Dizziness 03/13/2017  . Flu-like symptoms 12/02/2018  . Flu-like symptoms 12/02/2018  . HIV (human immunodeficiency virus infection) ([place]) 2009   11/20/2012 Last CD4 count 210 and VL <20  . 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 Hypertension 2009   Well controlled on HCTZ  . Hypokalemia 01/12/2019  . Lobar pneumonia ([place]) 01/12/2019  . Obesity 10/19/2020  . Obstructive sleep apnea 10/19/2020  . Otitis media 03/13/2017  . Polyuria 03/13/2017  . Poorly controlled diabetes mellitus ([place]) 03/13/2017  . Type 2 diabetes  mellitus (HCC)    Well controlled on metformin  . Weight loss 03/13/2017   Review of Systems:  Negative as per HPI    Assessment & Plan:   See Encounters Tab for problem based charting.  Patient discussed with Dr. Evette Doffing

## 2020-10-26 NOTE — Progress Notes (Signed)
Internal Medicine Clinic Attending ? ?Case discussed with Dr. Liang  At the time of the visit.  We reviewed the resident?s history and exam and pertinent patient test results.  I agree with the assessment, diagnosis, and plan of care documented in the resident?s note. ? ?

## 2020-11-18 ENCOUNTER — Other Ambulatory Visit: Payer: Self-pay | Admitting: Infectious Disease

## 2020-11-18 DIAGNOSIS — E1169 Type 2 diabetes mellitus with other specified complication: Secondary | ICD-10-CM

## 2020-11-18 DIAGNOSIS — E785 Hyperlipidemia, unspecified: Secondary | ICD-10-CM

## 2020-11-24 ENCOUNTER — Encounter: Payer: Self-pay | Admitting: Infectious Disease

## 2020-11-30 ENCOUNTER — Encounter: Payer: Medicaid Other | Admitting: Student

## 2020-11-30 NOTE — Progress Notes (Deleted)
   CC: ***  HPI:  Mr.Severin A Freimark is a 55 y.o. man with history of *** who presents to clinic for ***. Their last clinic visit was via telehealth on 10/25/20.   To see the details of this patient's management of their acute and chronic problems, please refer to the Assessment & Plan under the Encounters tab.    Past Medical History:  Diagnosis Date  . Acute respiratory failure with hypoxia ([place]) 01/12/2019  . Allergy    seasonal  . COVID-19 12/2018  . Dizziness 03/13/2017  . Flu-like symptoms 12/02/2018  . Flu-like symptoms 12/02/2018  . HIV (human immunodeficiency virus infection) ([place]) 2009   11/20/2012 Last CD4 count 210 and VL <20  . 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 Hypertension 2009   Well controlled on HCTZ  . Hypokalemia 01/12/2019  . Lobar pneumonia ([place]) 01/12/2019  . Obesity 10/19/2020  . Obstructive sleep apnea 10/19/2020  . Otitis media 03/13/2017  . Polyuria 03/13/2017  . Poorly controlled diabetes mellitus ([place]) 03/13/2017  . Type 2 diabetes mellitus (HCC)    Well controlled on metformin  . Weight loss 03/13/2017   Review of Systems:    ROS  Physical Exam:  There were no vitals filed for this visit. Constitutional: well-appearing *** sitting in chair, in no acute distress HENT: normocephalic atraumatic, mucous membranes moist Eyes: conjunctiva non-erythematous Neck: supple Cardiovascular: regular rate and rhythm, no m/r/g Pulmonary/Chest: normal work of breathing on room air, lungs clear to auscultation bilaterally Abdominal: soft, non-tender, non-distended MSK: normal bulk and tone Neurological: alert & oriented x 3, 5/5 strength in bilateral upper and lower extremities, normal gait Skin: warm and dry*** Psych: ***    Assessment & Plan:   See Encounters Tab for problem based charting.  Patient {GC/GE:[id number]::"discussed with","seen with"} Dr. {NAMES:[id number]::"Butcher","Guilloud","Hoffman","Mullen","Narendra","Raines","Vincent"}

## 2020-12-19 ENCOUNTER — Other Ambulatory Visit: Payer: Self-pay

## 2020-12-19 ENCOUNTER — Encounter: Payer: Self-pay | Admitting: Student

## 2020-12-19 ENCOUNTER — Ambulatory Visit (INDEPENDENT_AMBULATORY_CARE_PROVIDER_SITE_OTHER): Payer: Self-pay | Admitting: Student

## 2020-12-19 ENCOUNTER — Other Ambulatory Visit (HOSPITAL_COMMUNITY)
Admission: RE | Admit: 2020-12-19 | Discharge: 2020-12-19 | Disposition: A | Discharge status: Home / Self Care | Payer: Medicaid Other | Source: Ambulatory Visit | Attending: Internal Medicine | Admitting: Internal Medicine

## 2020-12-19 VITALS — BP 124/77 | HR 81 | Temp 98.0°F | Ht 66.0 in | Wt 249.0 lb

## 2020-12-19 DIAGNOSIS — N50812 Left testicular pain: Secondary | ICD-10-CM | POA: Diagnosis not present

## 2020-12-19 NOTE — Patient Instructions (Addendum)
Mr. Sweetser,   Thank you for your visit to the [facility] today. It was a pleasure meeting you. Today we discussed the following:  1) Concerns with urination, left testicle pain - I did not see or feel anything abnormal on my exam - I have ordered tests on your urine for infection, and I will call you with the result - Continue to stay hydrated and take your medications as prescribed - Please give [place] a call back if you develop intense pain, fever/chills   Please have the front desk schedule you for follow-up with your PCP, Dr. Lisabeth Devoid, in April. You are due for diabetes follow-up. Please bring your glucometer with you to that appointment!   If you have any questions or concerns, please call our clinic at [phone] between 9am-5pm. Outside of these hours, call [phone] and ask for the internal medicine resident on call. If you feel you are having a medical emergency please call 911.

## 2020-12-19 NOTE — Progress Notes (Signed)
   CC: malodorous urine, left testicle pain  HPI:  [name] is a 55 y.o. man with history as below who presents to clinic for evaluation of the above chief complaints. His last clinic visit was a telehealth visit on 10/25/20.    To see the details of this patient's management of their acute and chronic problems, please refer to the Assessment & Plan under the Encounters tab.    Past Medical History:  Diagnosis Date  . Acute respiratory failure with hypoxia ([place]) 01/12/2019  . Allergy    seasonal  . COVID-19 12/2018  . Dizziness 03/13/2017  . Flu-like symptoms 12/02/2018  . Flu-like symptoms 12/02/2018  . HIV (human immunodeficiency virus infection) ([place]) 2009   11/20/2012 Last CD4 count 210 and VL <20  . 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 Hypertension 2009   Well controlled on HCTZ  . Hypokalemia 01/12/2019  . Lobar pneumonia ([place]) 01/12/2019  . Obesity 10/19/2020  . Obstructive sleep apnea 10/19/2020  . Otitis media 03/13/2017  . Polyuria 03/13/2017  . Poorly controlled diabetes mellitus ([place]) 03/13/2017  . Type 2 diabetes mellitus (HCC)    Well controlled on metformin  . Weight loss 03/13/2017   Review of Systems:    Review of Systems  Constitutional: Negative for chills and fever.  Gastrointestinal: Positive for abdominal pain.  Genitourinary: Positive for dysuria and frequency. Negative for flank pain, hematuria and urgency.  Musculoskeletal: Negative for myalgias.  Skin: Negative for itching and rash.  Neurological: Negative for weakness.    Physical Exam:  Vitals:   12/19/20 0846  BP: 124/77  Pulse: 81  Temp: 98 F (36.7 C)  TempSrc: Oral  SpO2: 99%  Weight: 249 lb (112.9 kg)  Height: 5\' 6"  (1.676 m)   Constitutional: well-appearing man sitting in chair, in no acute distress HENT: normocephalic atraumatic, mucous membranes moist Eyes: conjunctiva non-erythematous Neck: supple Pulmonary/Chest: normal work of breathing on room  air Abdominal: soft, non-distended MSK: normal bulk and tone Neurological: alert & oriented x 3, normal gait Skin: warm and dry Psych: normal mood and affect GU: external genitalia appear normal, penis is uncircumcised. No scrotal swelling or discoloration. No penile discharge or lesions. Testes descended bilaterally, smooth, no masses. Minimal tenderness with palpation of left scrotum, no epididymal tenderness bilaterally.    Assessment & Plan:   See Encounters Tab for problem-based charting.  Patient discussed with Dr. Philipp Ovens

## 2020-12-19 NOTE — Assessment & Plan Note (Addendum)
Wayne Wolfe presents for evaluation of 2-3 week history of increased urinary frequency, malodorous urine, and L-sided scrotal pain.  He reports he cannot recall precisely when his symptoms started, but he first noted that his urine had a "different smell," then noticed that his urine was clearer than its normal yellow-green. Around that same time he noticed an intermittent dull pain in the left side of his scrotum. He does not know how to describe the smell of his urine. He asks if it is normal for urine to be clear sometimes. Discussed that urine can smell and appear differently depending on our diet, hydration status, medications, etc. He states the scrotal pain "comes and goes." Reports increased urinary frequency over this same time period. Did not bring his glucometer today but reports that his sugars have ranged high 90s to 100s over the last few weeks, and he reports adherence to his diabetes medications. He endorses burning with urination "sometimes." Denies hesitancy or problems with his stream. Denies fevers or chills, penile discharge. Endorses occasional R-sided abdominal pain. Was screened for STIs ~2 months ago by his ID doctor after a condom broke. He states he has been sexually active since but has used a condom consistently.   On exam, he is well-appearing. External genitalia appear normal, penis is uncircumcised. No scrotal swelling or discoloration. No penile discharge or lesions. Testes descended bilaterally, smooth, no masses. Minimal tenderness with palpation of left scrotum, no epididymal tenderness bilaterally.   Assessment/Plan: Differential for [name]'s symptoms includes hyperglycemia, urinary tract infection, STI, epididymitis, prostatitis. He is overall well-appearing and exam is overall reassuring, making prostatitis and epididymitis less likely. Counseled him regarding the symptoms which can be associated hyperglycemia.  - Urinalysis with reflex microscopy - Urine culture -  Gonorrhea, chlamydia, and yeast urine cytology - Counseled on return precautions - He is overdue for diabetes follow-up. Instructed him to schedule PCP follow-up for April.  ADDENDUM (12/22/20): Urine studies all NORMAL except for glucosuria (3+ glucose). Called patient to inform him of the results. He is scheduled for DM follow-up on 12/30/20.

## 2020-12-19 NOTE — Progress Notes (Signed)
Internal Medicine Clinic Attending  Case discussed with Dr. Shon Baton  At the time of the visit.  We reviewed the resident's history and exam and pertinent patient test results.  I agree with the assessment, diagnosis, and plan of care documented in the resident's note.   Per resident report, testicular exam was unremarkable and he is not currently having pain. Would consider testicular ultrasound if pain continues. He was given return precaution for signs and sxs of torsion.

## 2020-12-20 LAB — URINALYSIS, ROUTINE W REFLEX MICROSCOPIC
Bilirubin, UA: NEGATIVE
Ketones, UA: NEGATIVE
Leukocytes,UA: NEGATIVE
Nitrite, UA: NEGATIVE
RBC, UA: NEGATIVE
Specific Gravity, UA: >=1.03 — AB (ref 1.005–1.030)
Urobilinogen, Ur: 1 mg/dL (ref 0.2–1.0)
pH, UA: 5 (ref 5.0–7.5)

## 2020-12-20 LAB — URINE CYTOLOGY ANCILLARY ONLY
Candida Urine: NEGATIVE
Chlamydia: NEGATIVE
Comment: NEGATIVE
Comment: NORMAL
Neisseria Gonorrhea: NEGATIVE

## 2020-12-22 ENCOUNTER — Telehealth: Payer: Self-pay

## 2020-12-22 LAB — URINE CULTURE: Organism ID, Bacteria: NO GROWTH

## 2020-12-22 NOTE — Telephone Encounter (Signed)
Return pt's call - stated he's having difficulty getting Lisinopril. Informed pt it was last refilled 08/2020 #90 x 1 RF. Stated he had called the pharmacy. I called [facility] - stated last filled 11/29/20 and pt received 90 tabs. I called pt back - informed of the above. Stated he did not received the medication; asked pt to call [facility] back. Then after looking while talking to me, he found the medication and apologized.

## 2020-12-22 NOTE — Telephone Encounter (Signed)
Pls contact pt [phone]

## 2020-12-30 ENCOUNTER — Encounter: Payer: Medicaid Other | Admitting: Internal Medicine

## 2020-12-31 ENCOUNTER — Other Ambulatory Visit: Payer: Self-pay | Admitting: Infectious Disease

## 2020-12-31 DIAGNOSIS — B20 Human immunodeficiency virus [HIV] disease: Secondary | ICD-10-CM

## 2020-12-31 DIAGNOSIS — E1169 Type 2 diabetes mellitus with other specified complication: Secondary | ICD-10-CM

## 2020-12-31 DIAGNOSIS — E785 Hyperlipidemia, unspecified: Secondary | ICD-10-CM

## 2021-01-04 ENCOUNTER — Other Ambulatory Visit: Payer: Self-pay | Admitting: *Deleted

## 2021-01-04 DIAGNOSIS — I152 Hypertension secondary to endocrine disorders: Secondary | ICD-10-CM

## 2021-01-04 DIAGNOSIS — E1159 Type 2 diabetes mellitus with other circulatory complications: Secondary | ICD-10-CM

## 2021-01-04 MED ORDER — LISINOPRIL 20 MG PO TABS: 20.0000 mg | ORAL_TABLET | Freq: Every day | ORAL | 1 refills | Status: DC

## 2021-01-06 ENCOUNTER — Other Ambulatory Visit: Payer: Self-pay | Admitting: Infectious Disease

## 2021-01-06 DIAGNOSIS — E785 Hyperlipidemia, unspecified: Secondary | ICD-10-CM

## 2021-01-06 DIAGNOSIS — E1169 Type 2 diabetes mellitus with other specified complication: Secondary | ICD-10-CM

## 2021-01-09 ENCOUNTER — Other Ambulatory Visit: Payer: Self-pay

## 2021-01-09 ENCOUNTER — Other Ambulatory Visit: Payer: Self-pay | Admitting: Student

## 2021-01-09 DIAGNOSIS — E1169 Type 2 diabetes mellitus with other specified complication: Secondary | ICD-10-CM

## 2021-01-09 MED ORDER — PRAVASTATIN SODIUM 40 MG PO TABS: 40.0000 mg | ORAL_TABLET | Freq: Every day | ORAL | 0 refills | Status: DC

## 2021-01-09 MED ORDER — TRESIBA FLEXTOUCH 200 UNIT/ML ~~LOC~~ SOPN: 25.0000 [IU] | PEN_INJECTOR | Freq: Every evening | SUBCUTANEOUS | 3 refills | Status: DC

## 2021-01-09 NOTE — Telephone Encounter (Signed)
Can we call Mr. Stencil to schedule a DM follow up visit in the next couple of weeks?

## 2021-01-09 NOTE — Telephone Encounter (Signed)
  insulin degludec (TRESIBA FLEXTOUCH) 200 UNIT/ML FlexTouch Pen  pravastatin (PRAVACHOL) 40 MG tablet REFILL REQUEST @   [id number] - [place], [place] - [address] AT [address] Phone:  [phone]  Fax:  [phone]

## 2021-01-09 NOTE — Telephone Encounter (Signed)
Appt has been scheduled with Terisa Starr on 4/26.

## 2021-01-24 ENCOUNTER — Other Ambulatory Visit: Payer: Self-pay

## 2021-01-24 ENCOUNTER — Ambulatory Visit (INDEPENDENT_AMBULATORY_CARE_PROVIDER_SITE_OTHER): Payer: Self-pay | Admitting: Student

## 2021-01-24 ENCOUNTER — Encounter: Payer: Self-pay | Admitting: Student

## 2021-01-24 VITALS — BP 124/83 | HR 82 | Temp 98.6°F | Wt 243.1 lb

## 2021-01-24 DIAGNOSIS — E1159 Type 2 diabetes mellitus with other circulatory complications: Secondary | ICD-10-CM

## 2021-01-24 DIAGNOSIS — E1165 Type 2 diabetes mellitus with hyperglycemia: Secondary | ICD-10-CM

## 2021-01-24 DIAGNOSIS — R200 Anesthesia of skin: Secondary | ICD-10-CM

## 2021-01-24 DIAGNOSIS — I152 Hypertension secondary to endocrine disorders: Secondary | ICD-10-CM

## 2021-01-24 DIAGNOSIS — R202 Paresthesia of skin: Secondary | ICD-10-CM

## 2021-01-24 DIAGNOSIS — E785 Hyperlipidemia, unspecified: Secondary | ICD-10-CM

## 2021-01-24 DIAGNOSIS — E1169 Type 2 diabetes mellitus with other specified complication: Secondary | ICD-10-CM

## 2021-01-24 LAB — GLUCOSE, CAPILLARY: Glucose-Capillary: 229 mg/dL — ABNORMAL HIGH (ref 70–99)

## 2021-01-24 LAB — POCT GLYCOSYLATED HEMOGLOBIN (HGB A1C): Hemoglobin A1C: 12.1 % — AB (ref 4.0–5.6)

## 2021-01-24 MED ORDER — LISINOPRIL 20 MG PO TABS: 20.0000 mg | ORAL_TABLET | Freq: Every day | ORAL | 3 refills | Status: DC

## 2021-01-24 MED ORDER — METFORMIN HCL ER 500 MG PO TB24: ORAL_TABLET | ORAL | 3 refills | Status: DC

## 2021-01-24 MED ORDER — TRESIBA FLEXTOUCH 100 UNIT/ML ~~LOC~~ SOPN: 30.0000 [IU] | PEN_INJECTOR | Freq: Every day | SUBCUTANEOUS | 5 refills | Status: DC

## 2021-01-24 MED ORDER — GABAPENTIN 300 MG PO CAPS: 300.0000 mg | ORAL_CAPSULE | Freq: Three times a day (TID) | ORAL | 2 refills | Status: DC

## 2021-01-24 MED ORDER — GABAPENTIN 100 MG PO CAPS: 100.0000 mg | ORAL_CAPSULE | Freq: Three times a day (TID) | ORAL | 2 refills | Status: DC

## 2021-01-24 MED ORDER — TRULICITY 0.75 MG/0.5ML ~~LOC~~ SOAJ: 0.7500 mg | SUBCUTANEOUS | 0 refills | Status: DC

## 2021-01-24 NOTE — Patient Instructions (Addendum)
It was a pleasure seeing you in clinic. Today we discussed:   Diabetes: You sugars continue to be high. It is very important to control your blood sugars. The elevated levels can cause increase urination and thirst. Controlling you sugars can help improve this.  Please increase tresiba to 30 units daily Start trulicity 6.73 units weekly, Stop using the victoza Please take metformin 2 tablets twice daily   Please pick up gabapentin to help with numbness and tingling in the feet.  Please follow up in 1 month, please bring you glucose meter to your next visit  If you have any questions or concerns, please call our clinic at [phone] between 9am-5pm and after hours call [phone] and ask for the internal medicine resident on call. If you feel you are having a medical emergency please call 911.   Thank you, we look forward to helping you remain healthy!

## 2021-01-25 LAB — LIPID PANEL
Chol/HDL Ratio: 6 ratio — ABNORMAL HIGH (ref 0.0–5.0)
Cholesterol, Total: 161 mg/dL (ref 100–199)
HDL: 27 mg/dL — ABNORMAL LOW (ref >39)
LDL Chol Calc (NIH): 114 mg/dL — ABNORMAL HIGH (ref 0–99)
Triglycerides: 105 mg/dL (ref 0–149)
VLDL Cholesterol Cal: 20 mg/dL (ref 5–40)

## 2021-01-25 NOTE — Progress Notes (Signed)
Internal Medicine Clinic Attending ? ?Case discussed with Dr. Liang  At the time of the visit.  We reviewed the resident?s history and exam and pertinent patient test results.  I agree with the assessment, diagnosis, and plan of care documented in the resident?s note. ? ?

## 2021-01-25 NOTE — Assessment & Plan Note (Signed)
Has not tried gabapentin for his symptoms. Will start on gabapentin 300 mg three time daily and evaluate at next visit.

## 2021-01-25 NOTE — Assessment & Plan Note (Signed)
Patient reports continued polyuria and polydipsia since last OV. Notes Glucose in morning elevated between 160-230 and up to 300 during the day. Unfortunately did not have meter with him today. States he has been feeling very thirsty lately and craving sugary drinking. Estimated he has been have at one can of soda or juice a day. States he has been taking tresiba 26 units daily, stopped the victoza because he felt it would not help as it was not insulin. Notes he has been taking metformin noted ithis has not been picked up from pharmacy recently. Patient notes he has been taking 1 tablet twice a day rather than 2 tablets twice daily.   A1c of 12.1 today from 9.8 in January. Discussed that his glucose control has continued to worsen and is likely causing his increased urination and thirst. Dicussed the importance of good diabetes control in controlling his current symptoms as well as reducing long term health effects. Will increase his tresiba to 30 units daily. Discussed increasing his metformin back to 2 tablets twice daily. Will switch his victoza to trulicity as weekly dosing may be easier for him and appears to be preferred by insurance.  Plan Increase tresiba to 30 units nightly Increase metformin to 1000 mg twice daily Start trulicity 6.57 mg weekly for 4 week, will titrate up at next office visit Stop victoza Follow up in 4 weeks

## 2021-01-25 NOTE — Assessment & Plan Note (Signed)
Lipid panel rechecked today.  Lipid Panel     Component Value Date/Time   CHOL 161 01/24/2021 0958   TRIG 105 01/24/2021 0958   HDL 27 (L) 01/24/2021 0958   CHOLHDL 6.0 (H) 01/24/2021 0958   CHOLHDL 6.0 (H) 05/18/2020 0908   VLDL 18 01/13/2019 0442   LDLCALC 114 (H) 01/24/2021 0958   LDLCALC 117 (H) 05/18/2020 0908   LABVLDL 20 01/24/2021 0958    10 year ASCVD risk of 21.8% May benefit from switching to high intensity statin. Will discuss at next visit.

## 2021-01-25 NOTE — Assessment & Plan Note (Signed)
BP well controlled, continue on current medications.

## 2021-01-25 NOTE — Progress Notes (Signed)
   CC: diabetes follow up  HPI:  [name] is a 55 y.o. male with history below presents for follow up of diabetes. Please refer to problem based charting for further details and assessment and plan of current problem and chronic medical conditions.   Past Medical History:  Diagnosis Date  . Acute respiratory failure with hypoxia ([place]) 01/12/2019  . Allergy    seasonal  . COVID-19 12/2018  . Dizziness 03/13/2017  . Flu-like symptoms 12/02/2018  . Flu-like symptoms 12/02/2018  . HIV (human immunodeficiency virus infection) ([place]) 2009   11/20/2012 Last CD4 count 210 and VL <20  . 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 Hypertension 2009   Well controlled on HCTZ  . Hypokalemia 01/12/2019  . Lobar pneumonia ([place]) 01/12/2019  . Obesity 10/19/2020  . Obstructive sleep apnea 10/19/2020  . Otitis media 03/13/2017  . Polyuria 03/13/2017  . Poorly controlled diabetes mellitus ([place]) 03/13/2017  . Type 2 diabetes mellitus ([facility])    Well controlled on metformin  . Weight loss 03/13/2017   Review of Systems:  Negative as per HPI  Physical Exam:  Vitals:   01/24/21 0841  BP: 124/83  Pulse: 82  Temp: 98.6 F (37 C)  TempSrc: Oral  SpO2: 98%  Weight: 243 lb 1.6 oz (110.3 kg)   Constitutional: Appears well-developed and well-nourished. Obese HENT: Normocephalic and atraumatic Cardiovascular: Normal rate, regular rhythm. Distal pulses intact Respiratory:  Effort is normal.  Lungs are clear to auscultation bilaterally. GI: Nondistended, soft, nontender to palpation, normal active bowel sounds Musculoskeletal: Normal bulk and tone.  No peripheral edema noted. Neurological: Is alert and oriented x4, no apparent focal deficits noted. Skin: Warm and dry.  No rash, erythema, lesions noted. Psychiatric: Normal mood and affect.  Assessment & Plan:   See Encounters Tab for problem based charting.  Patient discussed with Dr. Heber Franklin

## 2021-01-26 ENCOUNTER — Telehealth: Payer: Self-pay

## 2021-01-26 NOTE — Telephone Encounter (Signed)
Need refill on Dulaglutide (TRULICITY) 8.89 VQ/9.4HW SOPN ;pt contact [place] [place] [facility] [place]

## 2021-01-26 NOTE — Telephone Encounter (Signed)
RX was sent via [place] on 01/24/21 by Dr. Lisabeth Devoid w/ receipt confirmation from pharmacy. TC to patient and he was instructed to call pharmacy. SChaplin, RN,BSN

## 2021-01-30 NOTE — Telephone Encounter (Signed)
Pt stated he called the pharmacy who stated a PA is needed for Trulicity. I will ask Regino Schultze to f/u.

## 2021-01-30 NOTE — Telephone Encounter (Signed)
Call to pharmacy for insurance information.  Was able to get name but no number.  Call to patient who said that he did not receive a Card with the information on it.  Will try and find something with insurance company number.  Has Medicare Part D [id number].  [employer] is the name of the plan.  Sander Nephew, RN  01/30/2021 1:28 PM.

## 2021-01-30 NOTE — Telephone Encounter (Signed)
Pt is calling back regarding medicine; pls contact [phone]

## 2021-02-06 ENCOUNTER — Telehealth: Payer: Self-pay | Admitting: *Deleted

## 2021-02-06 NOTE — Telephone Encounter (Signed)
PA for Trulicity was approved 11/08/3660 thru 02/06/2022.

## 2021-02-06 NOTE — Telephone Encounter (Addendum)
Additional information for Trulicity was faxed to [facility].  Awaiting determination.  Sander Nephew, RN 02/06/2021 11:29 AM Fax from [place] was approved 01/04/8591 thru 02/06/2022 if the patient continues with the same insurance Medicare Part D.  Call [phone] for questions.  Sander Nephew, RN 02/06/2021 2:59 PM.  [facility] was called and informed of.  Patient was called also and informed tat medication had been approved and that he can pick up today.  Sander Nephew, RN 02/07/2021 9:58 AM.

## 2021-02-17 ENCOUNTER — Encounter: Payer: Medicaid Other | Admitting: Infectious Disease

## 2021-02-22 ENCOUNTER — Encounter: Payer: Medicaid Other | Admitting: Internal Medicine

## 2021-02-24 ENCOUNTER — Ambulatory Visit: Payer: Medicaid Other | Admitting: Infectious Disease

## 2021-03-06 ENCOUNTER — Other Ambulatory Visit: Payer: Self-pay | Admitting: *Deleted

## 2021-03-06 DIAGNOSIS — E1165 Type 2 diabetes mellitus with hyperglycemia: Secondary | ICD-10-CM

## 2021-03-06 MED ORDER — TRULICITY 0.75 MG/0.5ML ~~LOC~~ SOAJ: 0.7500 mg | SUBCUTANEOUS | 0 refills | Status: DC

## 2021-03-13 ENCOUNTER — Other Ambulatory Visit: Payer: Self-pay | Admitting: Family

## 2021-03-13 DIAGNOSIS — B20 Human immunodeficiency virus [HIV] disease: Secondary | ICD-10-CM

## 2021-03-24 ENCOUNTER — Other Ambulatory Visit: Payer: Self-pay

## 2021-03-24 DIAGNOSIS — Z79899 Other long term (current) drug therapy: Secondary | ICD-10-CM

## 2021-03-24 DIAGNOSIS — B20 Human immunodeficiency virus [HIV] disease: Secondary | ICD-10-CM

## 2021-03-24 DIAGNOSIS — Z113 Encounter for screening for infections with a predominantly sexual mode of transmission: Secondary | ICD-10-CM

## 2021-03-29 ENCOUNTER — Other Ambulatory Visit: Payer: Self-pay

## 2021-03-29 DIAGNOSIS — Z113 Encounter for screening for infections with a predominantly sexual mode of transmission: Secondary | ICD-10-CM

## 2021-03-29 DIAGNOSIS — Z79899 Other long term (current) drug therapy: Secondary | ICD-10-CM

## 2021-03-29 DIAGNOSIS — B20 Human immunodeficiency virus [HIV] disease: Secondary | ICD-10-CM

## 2021-03-30 LAB — URINE CYTOLOGY ANCILLARY ONLY
Chlamydia: NEGATIVE
Comment: NEGATIVE
Comment: NORMAL
Neisseria Gonorrhea: NEGATIVE

## 2021-03-30 LAB — T-HELPER CELL (CD4) - (RCID CLINIC ONLY)
CD4 % Helper T Cell: 22 % — ABNORMAL LOW (ref 33–65)
CD4 T Cell Abs: 425 /uL (ref 400–1790)

## 2021-04-02 LAB — LIPID PANEL
Cholesterol: 141 mg/dL (ref <200)
HDL: 29 mg/dL — ABNORMAL LOW (ref >=40)
LDL Cholesterol (Calc): 94 mg/dL (calc)
Non-HDL Cholesterol (Calc): 112 mg/dL (calc) (ref <130)
Total CHOL/HDL Ratio: 4.9 (calc) (ref <5.0)
Triglycerides: 90 mg/dL (ref <150)

## 2021-04-02 LAB — COMPLETE METABOLIC PANEL WITH GFR
AG Ratio: 1.4 (calc) (ref 1.0–2.5)
ALT: 12 U/L (ref 9–46)
AST: 13 U/L (ref 10–35)
Albumin: 4 g/dL (ref 3.6–5.1)
Alkaline phosphatase (APISO): 56 U/L (ref 35–144)
BUN: 12 mg/dL (ref 7–25)
CO2: 28 mmol/L (ref 20–32)
Calcium: 9.3 mg/dL (ref 8.6–10.3)
Chloride: 103 mmol/L (ref 98–110)
Creat: 1.17 mg/dL (ref 0.70–1.33)
GFR, Est African American: 80 mL/min/{1.73_m2} (ref >=60)
GFR, Est Non African American: 69 mL/min/{1.73_m2} (ref >=60)
Globulin: 2.9 g/dL (calc) (ref 1.9–3.7)
Glucose, Bld: 118 mg/dL — ABNORMAL HIGH (ref 65–99)
Potassium: 4.8 mmol/L (ref 3.5–5.3)
Sodium: 138 mmol/L (ref 135–146)
Total Bilirubin: 0.5 mg/dL (ref 0.2–1.2)
Total Protein: 6.9 g/dL (ref 6.1–8.1)

## 2021-04-02 LAB — CBC WITH DIFFERENTIAL/PLATELET
Absolute Monocytes: 356 cells/uL (ref 200–950)
Basophils Absolute: 72 cells/uL (ref 0–200)
Basophils Relative: 1.6 %
Eosinophils Absolute: 131 cells/uL (ref 15–500)
Eosinophils Relative: 2.9 %
HCT: 49.1 % (ref 38.5–50.0)
Hemoglobin: 16.1 g/dL (ref 13.2–17.1)
Lymphs Abs: 2205 cells/uL (ref 850–3900)
MCH: 28.9 pg (ref 27.0–33.0)
MCHC: 32.8 g/dL (ref 32.0–36.0)
MCV: 88.2 fL (ref 80.0–100.0)
MPV: 9 fL (ref 7.5–12.5)
Monocytes Relative: 7.9 %
Neutro Abs: 1737 cells/uL (ref 1500–7800)
Neutrophils Relative %: 38.6 %
Platelets: 320 10*3/uL (ref 140–400)
RBC: 5.57 10*6/uL (ref 4.20–5.80)
RDW: 12.8 % (ref 11.0–15.0)
Total Lymphocyte: 49 %
WBC: 4.5 10*3/uL (ref 3.8–10.8)

## 2021-04-02 LAB — HIV-1 RNA QUANT-NO REFLEX-BLD
HIV 1 RNA Quant: <20 Copies/mL — ABNORMAL HIGH
HIV-1 RNA Quant, Log: <1.3 Log cps/mL — ABNORMAL HIGH

## 2021-04-02 LAB — RPR: RPR Ser Ql: REACTIVE — AB

## 2021-04-02 LAB — FLUORESCENT TREPONEMAL AB(FTA)-IGG-BLD: Fluorescent Treponemal ABS: REACTIVE — AB

## 2021-04-02 LAB — RPR TITER: RPR Titer: 1:2 {titer} — ABNORMAL HIGH

## 2021-04-04 ENCOUNTER — Other Ambulatory Visit: Payer: Self-pay

## 2021-04-04 DIAGNOSIS — E1165 Type 2 diabetes mellitus with hyperglycemia: Secondary | ICD-10-CM

## 2021-04-04 MED ORDER — TRULICITY 0.75 MG/0.5ML ~~LOC~~ SOAJ: 0.7500 mg | SUBCUTANEOUS | 0 refills | Status: DC

## 2021-04-04 NOTE — Telephone Encounter (Signed)
Dulaglutide (TRULICITY) 9.43 QW/0.3LD SOPN, refill request @  [facility] [place], [place] Phone:  [phone]  Fax:  [phone]

## 2021-04-13 ENCOUNTER — Ambulatory Visit (INDEPENDENT_AMBULATORY_CARE_PROVIDER_SITE_OTHER): Payer: Self-pay | Admitting: Infectious Disease

## 2021-04-13 ENCOUNTER — Other Ambulatory Visit: Payer: Self-pay

## 2021-04-13 ENCOUNTER — Ambulatory Visit: Payer: Self-pay

## 2021-04-13 ENCOUNTER — Other Ambulatory Visit (HOSPITAL_COMMUNITY): Payer: Self-pay

## 2021-04-13 ENCOUNTER — Encounter: Payer: Self-pay | Admitting: Infectious Disease

## 2021-04-13 ENCOUNTER — Ambulatory Visit (INDEPENDENT_AMBULATORY_CARE_PROVIDER_SITE_OTHER): Payer: Self-pay

## 2021-04-13 VITALS — BP 131/87 | HR 81 | Temp 98.1°F | Wt 243.6 lb

## 2021-04-13 DIAGNOSIS — E1169 Type 2 diabetes mellitus with other specified complication: Secondary | ICD-10-CM

## 2021-04-13 DIAGNOSIS — E1159 Type 2 diabetes mellitus with other circulatory complications: Secondary | ICD-10-CM

## 2021-04-13 DIAGNOSIS — A539 Syphilis, unspecified: Secondary | ICD-10-CM

## 2021-04-13 DIAGNOSIS — E785 Hyperlipidemia, unspecified: Secondary | ICD-10-CM

## 2021-04-13 DIAGNOSIS — E1165 Type 2 diabetes mellitus with hyperglycemia: Secondary | ICD-10-CM

## 2021-04-13 DIAGNOSIS — K6282 Dysplasia of anus: Secondary | ICD-10-CM

## 2021-04-13 DIAGNOSIS — I152 Hypertension secondary to endocrine disorders: Secondary | ICD-10-CM

## 2021-04-13 DIAGNOSIS — B20 Human immunodeficiency virus [HIV] disease: Secondary | ICD-10-CM

## 2021-04-13 DIAGNOSIS — Z23 Encounter for immunization: Secondary | ICD-10-CM

## 2021-04-13 MED ORDER — ODEFSEY 200-25-25 MG PO TABS: 1.0000 | ORAL_TABLET | Freq: Every day | ORAL | 11 refills | Status: DC

## 2021-04-13 NOTE — Progress Notes (Signed)
Chief complaint: Concerned about a problem which he was being seen for at [facility]  Subjective:    Patient ID: Wayne Wolfe, male    DOB: [birth year], 55 y.o.   MRN: [id number]  HIV Positive/AIDS  55 year old man previously perfectly suppressed on Prezista Norvir and Truvada whom I changed over to Atripla and then to Complera to [facility], excellent virological control.   Wayne Wolfe is following closely with internal medicine.  You mentioned that he was seen at [facility] for something that he might need to have surgery for.  He claimed that he no longer had assistance to go to this clinic.  Further review of his chart he was diagnosed with AIN at [facility] clinic.  He does actually have Medicare and so certainly visits with this clinic and other clinic should be covered.  Knowing this he now prefers to be followed locally in [place] and I referred him to [facility] surgery for evaluation of his AIN.  He also had questions about his kidney function and we went over his antiretroviral regimen and other antiretroviral regimens.     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   Hypertension 2009   Well controlled on HCTZ   Hypokalemia 01/12/2019   Lobar pneumonia ([place]) 01/12/2019   Obesity 10/19/2020   Obstructive sleep apnea 10/19/2020   Otitis media 03/13/2017   Polyuria 03/13/2017   Poorly controlled diabetes mellitus ([place]) 03/13/2017   Type 2 diabetes mellitus ([place])    Well controlled on metformin   Weight loss 03/13/2017    Past Surgical History:  Procedure Laterality Date   CARPAL TUNNEL RELEASE Right 1990s   none       Family History  Problem Relation Age of Onset   Diabetes Mellitus II Sister    Colon polyps Sister    Diabetes Mellitus II Mother    Coronary artery disease Mother 28       CABGx6   Coronary artery disease Sister 75       CABG   Diabetes Mellitus II Sister    Coronary artery disease Sister    Diabetes Mellitus II Sister    Colon cancer Neg Hx    Esophageal cancer Neg Hx    Rectal cancer Neg Hx    Stomach cancer Neg Hx       Social History   Socioeconomic History   Marital status: Single    Spouse name: Not on file   Number of children: Not on file   Years of education: 40   Highest education level: Not on file  Occupational History    Employer: UNEMPLOYED  Tobacco Use   Smoking status: Former    Types: Cigarettes    Quit date: 2003    Years since quitting: 19.5   Smokeless tobacco: Never  Vaping Use   Vaping Use: Never used  Substance and Sexual Activity   Alcohol use: No    Alcohol/week: 0.0 standard drinks    Comment: Last used 2004   Drug use: No    Comment: Last used 2003   Sexual activity: Yes    Partners:  Male    Comment: [place]  Other Topics Concern   Not on file  Social History Narrative   On disability. Lives in [place].    Social Determinants of Health   Financial Resource Strain: Not on file  Food Insecurity: Not on file  Transportation Needs: Not on file  Physical Activity: Not on file  Stress: Not on file  Social Connections: Not on file    No Known Allergies   Current Outpatient Medications:    ACCU-CHEK FASTCLIX LANCETS MISC, Check blood sugar before meals and after meals, Disp: 102 each, Rfl: 12   acetaminophen (TYLENOL) 500 MG tablet, Take 1,000 mg by mouth every 6 (six) hours as needed for fever., Disp: , Rfl:    Blood Glucose Monitoring Suppl (ACCU-CHEK GUIDE) w/Device KIT, 1 each by Does not apply route 6 (six) times daily., Disp: 1 kit, Rfl: 0   Dulaglutide (TRULICITY) 3.82 NK/5.3ZJ SOPN, Inject 0.75 mg into  the skin once a week., Disp: 2 mL, Rfl: 0   fexofenadine (ALLEGRA) 180 MG tablet, TAKE 1 TABLET(180 MG) BY MOUTH DAILY, Disp: 30 tablet, Rfl: 5   gabapentin (NEURONTIN) 300 MG capsule, Take 1 capsule (300 mg total) by mouth 3 (three) times daily., Disp: 90 capsule, Rfl: 2   glucose blood (ACCU-CHEK GUIDE) test strip, Check blood sugar before meals and after meals, Disp: 100 each, Rfl: 12   insulin degludec (TRESIBA FLEXTOUCH) 100 UNIT/ML FlexTouch Pen, Inject 30 Units into the skin daily., Disp: 15 mL, Rfl: 5   Insulin Pen Needle (B-D UF III MINI PEN NEEDLES) 31G X 5 MM MISC, USE AS DIRECTED 2 TIMES DAILY TO INJECT INSULIN UNDER THE SKIN, Disp: 200 each, Rfl: 5   lisinopril (ZESTRIL) 20 MG tablet, Take 1 tablet (20 mg total) by mouth daily., Disp: 90 tablet, Rfl: 3   metFORMIN (GLUCOPHAGE-XR) 500 MG 24 hr tablet, TAKE 2 TABLETS(1000 MG) BY MOUTH TWICE DAILY, Disp: 360 tablet, Rfl: 3   ODEFSEY 200-25-25 MG TABS tablet, TAKE 1 TABLET BY MOUTH DAILY WITH BREAKFAST, Disp: 30 tablet, Rfl: 3   pravastatin (PRAVACHOL) 40 MG tablet, TAKE 1 TABLET(40 MG) BY MOUTH DAILY, Disp: 90 tablet, Rfl: 3    Review of Systems  Constitutional:  Negative for activity change, appetite change, chills, diaphoresis, fatigue and fever.  HENT:  Negative for congestion, rhinorrhea, sinus pressure, sinus pain, sneezing, sore throat and trouble swallowing.   Eyes:  Negative for photophobia and visual disturbance.  Respiratory:  Negative for cough, chest tightness, shortness of breath, wheezing and stridor.   Cardiovascular:  Negative for chest pain, palpitations and leg swelling.  Gastrointestinal:  Negative for abdominal distention, abdominal pain, anal bleeding, blood in stool, constipation, diarrhea, nausea and vomiting.  Genitourinary:  Negative for difficulty urinating, dysuria, flank pain and hematuria.  Musculoskeletal:  Negative for arthralgias, back pain, gait problem, joint swelling and myalgias.  Skin:  Negative  for color change, pallor, rash and wound.  Neurological:  Negative for tremors, weakness and light-headedness.  Hematological:  Negative for adenopathy. Does not bruise/bleed easily.  Psychiatric/Behavioral:  Negative for agitation, behavioral problems, confusion, dysphoric mood and sleep disturbance.       Objective:   Physical Exam Constitutional:      General: He is not in acute distress.    Appearance: He is well-developed. He is not diaphoretic.  HENT:     Head: Normocephalic and atraumatic.     Nose: No rhinorrhea.     Mouth/Throat:     Pharynx:  No oropharyngeal exudate.  Eyes:     General: No scleral icterus.    Extraocular Movements: Extraocular movements intact.     Conjunctiva/sclera: Conjunctivae normal.     Pupils: Pupils are equal, round, and reactive to light.  Neck:     Vascular: No JVD.  Cardiovascular:     Rate and Rhythm: Normal rate.     Heart sounds: Normal heart sounds. No murmur heard.   No friction rub. No gallop.  Pulmonary:     Effort: Pulmonary effort is normal. No respiratory distress.     Breath sounds: Normal breath sounds. No stridor. No wheezing, rhonchi or rales.  Chest:     Chest wall: No tenderness.  Abdominal:     General: Bowel sounds are normal. There is no distension.  Musculoskeletal:        General: No tenderness.     Cervical back: Normal range of motion and neck supple.  Lymphadenopathy:     Cervical: No cervical adenopathy.  Skin:    General: Skin is warm and dry.     Coloration: Skin is not pale.     Findings: No erythema.  Neurological:     General: No focal deficit present.     Mental Status: He is alert and oriented to person, place, and time.     Motor: No abnormal muscle tone.     Coordination: Coordination normal.  Psychiatric:        Attention and Perception: Attention and perception normal.        Mood and Affect: Mood is anxious.        Speech: Speech normal.        Behavior: Behavior normal.        Thought  Content: Thought content normal.        Judgment: Judgment normal.          Assessment & Plan:   HIV: Reviewed how to take [facility] with food and avoidance of antacids.  Reviewed other possible options that he could be switched to including Dovato but he would prefer to be on his current regimen  DM: following in IM clinic  HTN: better controlled  Vitals:   04/13/21 0959  BP: 131/87  Pulse: 81  Temp: 98.1 F (36.7 C)    Hyperlipidemia: Following at [facility] at goal  Lipid Panel     Component Value Date/Time   CHOL 141 03/29/2021 0955   CHOL 161 01/24/2021 0958   TRIG 90 03/29/2021 0955   HDL 29 (L) 03/29/2021 0955   HDL 27 (L) 01/24/2021 0958   CHOLHDL 4.9 03/29/2021 0955   VLDL 18 01/13/2019 0442   LDLCALC 94 03/29/2021 0955   LABVLDL 20 01/24/2021 0958     Abnormal QXI:[facility]WTU at [facility]: he has not followed up with them. I am referring him to [facility]. obstructs the utmost importance of following up on this so that he does not develop a rectal cancer and that we are proactive about his health  COVID prevention: gave a booster today  I spent more than 40 minutes with the patient including face to face counseling of the patient personally reviewing radiographs, along with pertinent laboratory microbiological, virological data review of medical records before and during the visit and in coordination of his care.

## 2021-04-13 NOTE — Progress Notes (Signed)
   Covid-19 Vaccination Clinic  Name:  JAMICAH ANSTEAD    MRN: [id number] DOB: 1965/11/06  04/13/2021  Mr. Hentz was observed post Covid-19 immunization for 15 minutes without incident. He was provided with Vaccine Information Sheet and instruction to access the V-Safe system.   Mr. Cimo was instructed to call 911 with any severe reactions post vaccine: Difficulty breathing  Swelling of face and throat  A fast heartbeat  A bad rash all over body  Dizziness and weakness   Immunizations Administered     Name Date Dose VIS Date Route   PFIZER Comrnaty(Gray TOP) Covid-19 Vaccine 04/13/2021 10:41 AM 0.3 mL 09/08/2020 Intramuscular   Manufacturer: [place]   Lot: [id number]   NDC: Heppner Knox Holdman, CMA

## 2021-04-18 ENCOUNTER — Other Ambulatory Visit: Payer: Medicaid Other

## 2021-05-01 ENCOUNTER — Other Ambulatory Visit (HOSPITAL_COMMUNITY): Payer: Self-pay

## 2021-05-02 ENCOUNTER — Other Ambulatory Visit: Payer: Self-pay | Admitting: Student

## 2021-05-02 ENCOUNTER — Encounter: Payer: Medicaid Other | Admitting: Infectious Disease

## 2021-05-02 DIAGNOSIS — E1165 Type 2 diabetes mellitus with hyperglycemia: Secondary | ICD-10-CM

## 2021-05-22 ENCOUNTER — Encounter: Payer: Self-pay | Admitting: Infectious Disease

## 2021-05-23 ENCOUNTER — Ambulatory Visit: Payer: Self-pay | Admitting: Student

## 2021-05-23 VITALS — BP 131/81 | HR 82 | Temp 98.0°F | Ht 66.0 in | Wt 252.2 lb

## 2021-05-23 DIAGNOSIS — K6282 Dysplasia of anus: Secondary | ICD-10-CM

## 2021-05-23 DIAGNOSIS — I152 Hypertension secondary to endocrine disorders: Secondary | ICD-10-CM

## 2021-05-23 DIAGNOSIS — E1165 Type 2 diabetes mellitus with hyperglycemia: Secondary | ICD-10-CM

## 2021-05-23 DIAGNOSIS — E1159 Type 2 diabetes mellitus with other circulatory complications: Secondary | ICD-10-CM

## 2021-05-23 DIAGNOSIS — E785 Hyperlipidemia, unspecified: Secondary | ICD-10-CM

## 2021-05-23 DIAGNOSIS — E1169 Type 2 diabetes mellitus with other specified complication: Secondary | ICD-10-CM

## 2021-05-23 LAB — POCT GLYCOSYLATED HEMOGLOBIN (HGB A1C): Hemoglobin A1C: 8.4 % — AB (ref 4.0–5.6)

## 2021-05-23 LAB — GLUCOSE, CAPILLARY: Glucose-Capillary: 104 mg/dL — ABNORMAL HIGH (ref 70–99)

## 2021-05-23 MED ORDER — TRULICITY 1.5 MG/0.5ML ~~LOC~~ SOAJ
1.5000 mg | SUBCUTANEOUS | 2 refills | Status: DC
Start: 2021-05-23 — End: 2021-08-30

## 2021-05-23 NOTE — Patient Instructions (Signed)
It was a pleasure seeing you in clinic. Today we discussed:   Diabetes: You are doing a great job with reducing you blood sugars. Please increase your trulicity to 1.5 mg weekly once out of you current dose. Please continue taking tresiba 30 units daily and metformin twice a day.   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 If you feel you are having a medical emergency please call 911.   Thank you, we look forward to helping you remain healthy!

## 2021-05-24 LAB — MICROALBUMIN / CREATININE URINE RATIO
Creatinine, Urine: 60.4 mg/dL
Microalb/Creat Ratio: 6 mg/g creat (ref 0–29)
Microalbumin, Urine: 3.8 ug/mL

## 2021-05-26 MED ORDER — METFORMIN HCL ER (MOD) 1000 MG PO TB24: ORAL_TABLET | ORAL | 2 refills | Status: DC

## 2021-05-26 MED ORDER — ROSUVASTATIN CALCIUM 20 MG PO TABS: 20.0000 mg | ORAL_TABLET | Freq: Every day | ORAL | 2 refills | Status: DC

## 2021-05-26 NOTE — Assessment & Plan Note (Signed)
Dicussed switching to high intensity statin given his diabetes and elevated LDL on prior panel. He has not had any issues with pravastatin, does not recall ever being on a high intensity statin in the past. Plan to start rosuvastatin 20 mg daily. Will recheck lipid at next visit.

## 2021-05-26 NOTE — Assessment & Plan Note (Signed)
BP well controlled on current medications. Continue lisinopril 20 mg daily. Stable renal function from labs on 03/29/2021

## 2021-05-26 NOTE — Progress Notes (Signed)
   CC: Diabetes follow up, AIN, hyperlipidemia  HPI:  Wayne Wolfe is a 55 y.o. male with history below present for follow up of diabetes and other chronic conditions. Please refer to problem based charting for further details and assessment and plan of current problem and chronic medical conditions.   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   Hypertension 2009   Well controlled on HCTZ   Hypokalemia 01/12/2019   Lobar pneumonia ([place]) 01/12/2019   Obesity 10/19/2020   Obstructive sleep apnea 10/19/2020   Otitis media 03/13/2017   Polyuria 03/13/2017   Poorly controlled diabetes mellitus ([place]) 03/13/2017   Type 2 diabetes mellitus ([place])    Well controlled on metformin   Weight loss 03/13/2017   Review of Systems: Negative as per HPI  Physical Exam:  Vitals:   05/23/21 1352  BP: 131/81  Pulse: 82  Temp: 98 F (36.7 C)  SpO2: 99%  Weight: 252 lb 3.2 oz (114.4 kg)  Height: '5\' 6"'$  (1.676 m)   Constitutional: Sitting up in chain with shoes off, in no distress, obese HENT: Normocephalic and atraumatic, EOMI, conjunctiva normal, moist mucous membranes Cardiovascular: Normal rate, regular rhythm, S1 and S2 present, no murmurs, rubs, gallops.  Distal pulses intact Respiratory: No respiratory distress, no accessory muscle use.  Effort is normal.  Lungs are clear to auscultation bilaterally. GI: Nondistended, soft, nontender to palpation, normal active bowel sounds Musculoskeletal: Normal bulk and tone.  No peripheral edema noted. Neurological: Is alert and oriented x4, no apparent focal deficits noted. Skin: Warm and dry.  No wounds or ulceration on  bilateral feet Psychiatric: Normal mood and affect.    Assessment & Plan:   See Encounters Tab for problem based charting.  Patient discussed with Dr. Jimmye Norman

## 2021-05-26 NOTE — Assessment & Plan Note (Signed)
Notes occasional blood streaked stool. Was previously follow by surgery at [facility] with plan for EUA and possible ablation unfortunately patient lost insurance. Referred to [facility] by Dr. Bonnita Hollow at recent ID office visit. Patient told he needed to obtain orange card prior to scheduling an appointment. Althought  per our records surgery is not currently accepting orange card referrals.  Patient has applied and awaiting reply for his orange card. Patient will call if he need further assistance with referral to surgery.

## 2021-05-26 NOTE — Assessment & Plan Note (Addendum)
Patient reporting improved fasting blood sugars. Fasting blood sugars at home between 120-130, with lowest reading of 110. A1c down to 8.4 from 12.1 at last visit. Urine microalbumin creatinine wnl. Patient notes he has been more consistent with taking his diabetic medication. Denies any prior issues with affording his medications. Notes he has been doing well with the trulicity.Dnies abdominal pain, nausea, or vomiting. Does note ocassional diarrhea a couple times a week, does not feel this correlates with trulicity doses or when he takes more of his metformin. Notes he takes his metformin twice daily sometimes three times daily. Notes prior urinary symptoms have resolved. Congratulated patient on improved glycemic control and encouraged him to continue.   Discussed titrating his dose of trulicity and metformin as he is tolerating this well. Patient agreeable with this plan.  Diabetic foot exam completed today. Is due for diabetic eye exam. Denies prior history of diabetic retinopathy. Will make appointment with [facility] for diabetic eye screening.   Plan Continue tresiba 30 units daily Increase trulicity to 1.5 mg weekly Increase metformin to 1000 mg twice daily Referral to [facility] for diabetic eye exam Follow up in 3 months  Addendum   Attempted to switch metformin to 1000 mg tabs but unfortunately his insurance with only cover 500 mg tablets. Called patient to clarify this and he is agreeable to taking 2 tabs twice daily

## 2021-05-30 NOTE — Progress Notes (Signed)
Internal Medicine Clinic Attending ? ?Case discussed with Dr. Liang  At the time of the visit.  We reviewed the resident?s history and exam and pertinent patient test results.  I agree with the assessment, diagnosis, and plan of care documented in the resident?s note. ? ?

## 2021-06-01 ENCOUNTER — Encounter: Payer: Self-pay | Admitting: Student

## 2021-06-07 ENCOUNTER — Telehealth: Payer: Self-pay | Admitting: Student

## 2021-06-07 NOTE — Telephone Encounter (Signed)
Patient walked into clinic today so a copy of his GCCN/orange card could be scanned into his chart.  Also, he would like Dr. Lisabeth Devoid to give him a call.  Informed the patient that she was not in the office today, but I will forward her message.

## 2021-06-13 ENCOUNTER — Other Ambulatory Visit: Payer: Self-pay | Admitting: Student

## 2021-06-13 DIAGNOSIS — K6282 Dysplasia of anus: Secondary | ICD-10-CM

## 2021-06-13 NOTE — Telephone Encounter (Signed)
Called patient. 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 Recently obtained orange card. Discussed with referral coordinator will make new referral for general surgery for him to follow up on his recent rectal bleeding.

## 2021-08-09 ENCOUNTER — Encounter: Payer: Medicaid Other | Admitting: Student

## 2021-08-28 ENCOUNTER — Other Ambulatory Visit: Payer: Self-pay | Admitting: Student

## 2021-08-28 DIAGNOSIS — E1165 Type 2 diabetes mellitus with hyperglycemia: Secondary | ICD-10-CM

## 2021-08-31 ENCOUNTER — Ambulatory Visit: Payer: Self-pay

## 2021-09-02 ENCOUNTER — Other Ambulatory Visit: Payer: Self-pay | Admitting: Student

## 2021-09-02 DIAGNOSIS — E785 Hyperlipidemia, unspecified: Secondary | ICD-10-CM

## 2021-09-02 DIAGNOSIS — E1169 Type 2 diabetes mellitus with other specified complication: Secondary | ICD-10-CM

## 2021-09-20 ENCOUNTER — Other Ambulatory Visit: Payer: Self-pay

## 2021-09-20 ENCOUNTER — Ambulatory Visit (INDEPENDENT_AMBULATORY_CARE_PROVIDER_SITE_OTHER): Payer: Self-pay

## 2021-09-20 DIAGNOSIS — Z23 Encounter for immunization: Secondary | ICD-10-CM

## 2021-09-20 NOTE — Progress Notes (Signed)
° °  Covid-19 Vaccination Clinic  Name:  Wayne Wolfe    MRN: [id number] DOB: [birth year]  09/20/2021  Mr. Nolden was observed post Covid-19 immunization for 15 minutes without incident. He was provided with Vaccine Information Sheet and instruction to access the V-Safe system.   Mr. Ripberger was instructed to call 911 with any severe reactions post vaccine: Difficulty breathing  Swelling of face and throat  A fast heartbeat  A bad rash all over body  Dizziness and weakness   Immunizations Administered     Name Date Dose VIS Date Route   Pfizer Covid-19 Vaccine Bivalent Booster 09/20/2021 11:23 AM 0.3 mL 05/31/2021 Intramuscular   Manufacturer: [place]   Lot: [id number]   [place]: [place]

## 2021-10-16 ENCOUNTER — Other Ambulatory Visit: Payer: Self-pay

## 2021-10-30 ENCOUNTER — Ambulatory Visit (INDEPENDENT_AMBULATORY_CARE_PROVIDER_SITE_OTHER): Payer: Self-pay | Admitting: Infectious Disease

## 2021-10-30 ENCOUNTER — Encounter: Payer: Self-pay | Admitting: Infectious Disease

## 2021-10-30 ENCOUNTER — Other Ambulatory Visit: Payer: Self-pay

## 2021-10-30 VITALS — BP 121/81 | HR 78 | Temp 97.2°F | Ht 66.0 in | Wt 247.0 lb

## 2021-10-30 DIAGNOSIS — Z113 Encounter for screening for infections with a predominantly sexual mode of transmission: Secondary | ICD-10-CM

## 2021-10-30 DIAGNOSIS — Z23 Encounter for immunization: Secondary | ICD-10-CM

## 2021-10-30 DIAGNOSIS — K6282 Dysplasia of anus: Secondary | ICD-10-CM

## 2021-10-30 DIAGNOSIS — E1169 Type 2 diabetes mellitus with other specified complication: Secondary | ICD-10-CM

## 2021-10-30 DIAGNOSIS — E1159 Type 2 diabetes mellitus with other circulatory complications: Secondary | ICD-10-CM

## 2021-10-30 DIAGNOSIS — I152 Hypertension secondary to endocrine disorders: Secondary | ICD-10-CM

## 2021-10-30 DIAGNOSIS — B20 Human immunodeficiency virus [HIV] disease: Secondary | ICD-10-CM

## 2021-10-30 DIAGNOSIS — E785 Hyperlipidemia, unspecified: Secondary | ICD-10-CM

## 2021-10-30 DIAGNOSIS — A539 Syphilis, unspecified: Secondary | ICD-10-CM

## 2021-10-30 NOTE — Progress Notes (Signed)
Chief complaint: Some burning when he urinates Subjective:    Patient ID: Wayne Wolfe, male    DOB: 1966/06/18, 56 y.o.   MRN: [id number]  HIV Positive/AIDS  56 year old man previously perfectly suppressed on Prezista Norvir and Truvada whom I changed over to Atripla and then to Complera to [facility], excellent virological control.   Wayne Wolfe is following closely with internal medicine.  He has  AIN  followed by some kind of surgery.   Wayne Wolfe says that he had protective receptive anal course but unprotected oral sex with another man and has now burning when he urinates is concerned that he might have chlamydia.      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   Hypertension 2009   Well controlled on HCTZ   Hypokalemia 01/12/2019   Lobar pneumonia ([place]) 01/12/2019   Obesity 10/19/2020   Obstructive sleep apnea 10/19/2020   Otitis media 03/13/2017   Polyuria 03/13/2017   Poorly controlled diabetes mellitus ([place]) 03/13/2017   Type 2 diabetes mellitus ([place])    Well controlled on metformin   Weight loss 03/13/2017    Past Surgical History:  Procedure Laterality Date   CARPAL TUNNEL RELEASE Right 1990s   none      Family History  Problem Relation Age of Onset   Diabetes Mellitus II Sister    Colon polyps Sister    Diabetes Mellitus II Mother    Coronary artery disease Mother 33       CABGx6   Coronary artery disease Sister 40       CABG   Diabetes Mellitus II Sister    Coronary artery disease Sister    Diabetes Mellitus II Sister    Colon cancer Neg Hx    Esophageal cancer Neg Hx    Rectal cancer Neg Hx    Stomach cancer Neg Hx       Social  History   Socioeconomic History   Marital status: Single    Spouse name: Not on file   Number of children: Not on file   Years of education: 3   Highest education level: Not on file  Occupational History    Employer: UNEMPLOYED  Tobacco Use   Smoking status: Former    Types: Cigarettes    Quit date: 2003    Years since quitting: 20.0   Smokeless tobacco: Never  Vaping Use   Vaping Use: Never used  Substance and Sexual Activity   Alcohol use: No    Alcohol/week: 0.0 standard drinks    Comment: Last used 2004   Drug use: No    Comment: Last used 2003   Sexual activity: Yes    Partners: Male    Comment: [place]  Other Topics Concern   Not on file  Social History Narrative   On disability. Lives in [place].    Social Determinants of Health   Financial Resource Strain: Not on file  Food Insecurity: Not on file  Transportation Needs: Not on file  Physical Activity: Not on file  Stress: Not on file  Social Connections: Not on file    No Known Allergies  Current Outpatient Medications:    ACCU-CHEK FASTCLIX LANCETS MISC, Check blood sugar before meals and after meals, Disp: 102 each, Rfl: 12   acetaminophen (TYLENOL) 500 MG tablet, Take 1,000 mg by mouth every 6 (six) hours as needed for fever., Disp: , Rfl:    Blood Glucose Monitoring Suppl (ACCU-CHEK GUIDE) w/Device KIT, 1 each by Does not apply route 6 (six) times daily., Disp: 1 kit, Rfl: 0   emtricitabine-rilpivir-tenofovir AF (ODEFSEY) 200-25-25 MG TABS tablet, Take 1 tablet by mouth daily with breakfast., Disp: 30 tablet, Rfl: 11   fexofenadine (ALLEGRA) 180 MG tablet, TAKE 1 TABLET(180 MG) BY MOUTH DAILY, Disp: 30 tablet, Rfl: 5   gabapentin (NEURONTIN) 300 MG capsule, Take 1 capsule (300 mg total) by mouth 3 (three) times daily., Disp: 90 capsule, Rfl: 2   glucose blood (ACCU-CHEK GUIDE) test strip, Check blood sugar before meals and after meals, Disp: 100 each, Rfl: 12   insulin degludec (TRESIBA  FLEXTOUCH) 100 UNIT/ML FlexTouch Pen, Inject 30 Units into the skin daily., Disp: 15 mL, Rfl: 5   Insulin Pen Needle (B-D UF III MINI PEN NEEDLES) 31G X 5 MM MISC, USE AS DIRECTED 2 TIMES DAILY TO INJECT INSULIN UNDER THE SKIN, Disp: 200 each, Rfl: 5   lisinopril (ZESTRIL) 20 MG tablet, Take 1 tablet (20 mg total) by mouth daily., Disp: 90 tablet, Rfl: 3   metFORMIN (GLUMETZA) 1000 MG (MOD) 24 hr tablet, TAKE 2 TABLETS(1000 MG) BY MOUTH TWICE DAILY, Disp: 30 tablet, Rfl: 2   rosuvastatin (CRESTOR) 20 MG tablet, TAKE 1 TABLET(20 MG) BY MOUTH DAILY, Disp: 90 tablet, Rfl: 3   TRULICITY 1.5 PX/1.0GY SOPN, ADMINISTER 1.5 MG UNDER THE SKIN 1 TIME A WEEK, Disp: 6 mL, Rfl: 3    Review of Systems  Constitutional:  Negative for activity change, appetite change, chills, diaphoresis, fatigue, fever and unexpected weight change.  HENT:  Negative for congestion, rhinorrhea, sinus pressure, sneezing, sore throat and trouble swallowing.   Eyes:  Negative for photophobia and visual disturbance.  Respiratory:  Negative for cough, chest tightness, shortness of breath, wheezing and stridor.   Cardiovascular:  Negative for chest pain, palpitations and leg swelling.  Gastrointestinal:  Negative for abdominal distention, abdominal pain, anal bleeding, blood in stool, constipation, diarrhea, nausea and vomiting.  Genitourinary:  Positive for dysuria. Negative for difficulty urinating, flank pain and hematuria.  Musculoskeletal:  Negative for arthralgias, back pain, gait problem, joint swelling and myalgias.  Skin:  Negative for color change, pallor, rash and wound.  Neurological:  Negative for dizziness, tremors, weakness and light-headedness.  Hematological:  Negative for adenopathy. Does not bruise/bleed easily.  Psychiatric/Behavioral:  Negative for agitation, behavioral problems, confusion, decreased concentration, dysphoric mood and sleep disturbance.       Objective:   Physical Exam Constitutional:       General: He is not in acute distress.    Appearance: Normal appearance. He is well-developed. He is not ill-appearing or diaphoretic.  HENT:     Head: Normocephalic and atraumatic.     Right Ear: Hearing and external ear normal.     Left Ear: Hearing and external ear normal.     Nose: No nasal deformity or rhinorrhea.  Eyes:     General: No scleral icterus.    Conjunctiva/sclera: Conjunctivae normal.     Right eye: Right conjunctiva is not injected.     Left eye: Left conjunctiva is not injected.     Pupils: Pupils are equal, round, and reactive to light.  Neck:     Vascular: No JVD.  Cardiovascular:     Rate and Rhythm: Normal rate and regular rhythm.     Heart sounds: S1 normal and S2 normal.    Friction rub present.  Pulmonary:     Effort: Pulmonary effort is normal. No respiratory distress.     Breath sounds: No wheezing.  Abdominal:     General: Bowel sounds are normal. There is no distension.     Palpations: Abdomen is soft.     Tenderness: There is no abdominal tenderness.  Genitourinary:    Rectum: Guaiac result negative.  Musculoskeletal:        General: Normal range of motion.     Right shoulder: Normal.     Left shoulder: Normal.     Cervical back: Normal range of motion and neck supple.     Right hip: Normal.     Left hip: Normal.     Right knee: Normal.     Left knee: Normal.  Lymphadenopathy:     Head:     Right side of head: No submandibular, preauricular or posterior auricular adenopathy.     Left side of head: No submandibular, preauricular or posterior auricular adenopathy.     Cervical: No cervical adenopathy.     Right cervical: No superficial or deep cervical adenopathy.    Left cervical: No superficial or deep cervical adenopathy.  Skin:    General: Skin is warm and dry.     Coloration: Skin is not pale.     Findings: No abrasion, bruising, ecchymosis, erythema, lesion or rash.     Nails: There is no clubbing.  Neurological:     General: No  focal deficit present.     Mental Status: He is alert and oriented to person, place, and time.     Sensory: No sensory deficit.     Coordination: Coordination normal.     Gait: Gait normal.  Psychiatric:        Attention and Perception: He is attentive.        Mood and Affect: Mood normal.        Speech: Speech normal.        Behavior: Behavior normal. Behavior is cooperative.        Thought Content: Thought content normal.        Judgment: Judgment normal.          Assessment & Plan:   HIV disease  Rechecking viral load today CD4 count CBC CMP  I will continue him on Odefsey with food and avoidance of antacids.  Dysuria history of gonorrhea chlamydia syphilis: We will check syphilis and blood as well as GC chlamydia and urine as well as an oropharynx and rectum.  Diabetes mellitus: His A1c has come down to 8  He is on multiple oral agents as well as insulin.  He says of some trouble with one of his diabetic medications  Hypertension: Blood pressure well controlled he is continue on lisinopril Vitals:   10/30/21 1131  BP: 121/81  Pulse: 78  Temp: (!) 97.2 F (36.2 C)     Hyperlipidemia: Most recent metabolic panel showed his LDL at 94.  He is on Crestor consider intensification of dose   Lipid Panel     Component Value Date/Time   CHOL 141 03/29/2021 0955   CHOL 161 01/24/2021 0958   TRIG 90 03/29/2021 0955   HDL 29 (L) 03/29/2021 0955   HDL 27 (L) 01/24/2021 0958   CHOLHDL  4.9 03/29/2021 0955   VLDL 18 01/13/2019 0442   LDLCALC 94 03/29/2021 0955   LABVLDL 20 01/24/2021 0958    Vaccine counseling: Recommended that he get Prevnar

## 2021-10-31 LAB — CYTOLOGY, (ORAL, ANAL, URETHRAL) ANCILLARY ONLY
Chlamydia: NEGATIVE
Comment: NEGATIVE
Comment: NORMAL
Neisseria Gonorrhea: NEGATIVE

## 2021-10-31 LAB — URINE CYTOLOGY ANCILLARY ONLY
Chlamydia: NEGATIVE
Comment: NEGATIVE
Comment: NORMAL
Neisseria Gonorrhea: NEGATIVE

## 2021-10-31 LAB — T-HELPER CELL (CD4) - (RCID CLINIC ONLY)
CD4 % Helper T Cell: 23 % — ABNORMAL LOW (ref 33–65)
CD4 T Cell Abs: 427 /uL (ref 400–1790)

## 2021-11-01 LAB — RPR: RPR Ser Ql: REACTIVE — AB

## 2021-11-01 LAB — LIPID PANEL
Cholesterol: 114 mg/dL (ref <200)
HDL: 28 mg/dL — ABNORMAL LOW (ref >=40)
LDL Cholesterol (Calc): 70 mg/dL (calc)
Non-HDL Cholesterol (Calc): 86 mg/dL (calc) (ref <130)
Total CHOL/HDL Ratio: 4.1 (calc) (ref <5.0)
Triglycerides: 82 mg/dL (ref <150)

## 2021-11-01 LAB — COMPLETE METABOLIC PANEL WITH GFR
AG Ratio: 1.7 (calc) (ref 1.0–2.5)
ALT: 13 U/L (ref 9–46)
AST: 10 U/L (ref 10–35)
Albumin: 4.4 g/dL (ref 3.6–5.1)
Alkaline phosphatase (APISO): 68 U/L (ref 35–144)
BUN: 11 mg/dL (ref 7–25)
CO2: 32 mmol/L (ref 20–32)
Calcium: 9.4 mg/dL (ref 8.6–10.3)
Chloride: 100 mmol/L (ref 98–110)
Creat: 1.12 mg/dL (ref 0.70–1.30)
Globulin: 2.6 g/dL (calc) (ref 1.9–3.7)
Glucose, Bld: 164 mg/dL — ABNORMAL HIGH (ref 65–99)
Potassium: 4.3 mmol/L (ref 3.5–5.3)
Sodium: 136 mmol/L (ref 135–146)
Total Bilirubin: 0.8 mg/dL (ref 0.2–1.2)
Total Protein: 7 g/dL (ref 6.1–8.1)
eGFR: 77 mL/min/{1.73_m2} (ref >=60)

## 2021-11-01 LAB — CBC WITH DIFFERENTIAL/PLATELET
Absolute Monocytes: 406 cells/uL (ref 200–950)
Basophils Absolute: 70 cells/uL (ref 0–200)
Basophils Relative: 1.8 %
Eosinophils Absolute: 90 cells/uL (ref 15–500)
Eosinophils Relative: 2.3 %
HCT: 51.4 % — ABNORMAL HIGH (ref 38.5–50.0)
Hemoglobin: 17.1 g/dL (ref 13.2–17.1)
Lymphs Abs: 2083 cells/uL (ref 850–3900)
MCH: 29.5 pg (ref 27.0–33.0)
MCHC: 33.3 g/dL (ref 32.0–36.0)
MCV: 88.8 fL (ref 80.0–100.0)
MPV: 9.6 fL (ref 7.5–12.5)
Monocytes Relative: 10.4 %
Neutro Abs: 1252 cells/uL — ABNORMAL LOW (ref 1500–7800)
Neutrophils Relative %: 32.1 %
Platelets: 315 10*3/uL (ref 140–400)
RBC: 5.79 10*6/uL (ref 4.20–5.80)
RDW: 13.3 % (ref 11.0–15.0)
Total Lymphocyte: 53.4 %
WBC: 3.9 10*3/uL (ref 3.8–10.8)

## 2021-11-01 LAB — FLUORESCENT TREPONEMAL AB(FTA)-IGG-BLD: Fluorescent Treponemal ABS: REACTIVE — AB

## 2021-11-01 LAB — HIV-1 RNA QUANT-NO REFLEX-BLD
HIV 1 RNA Quant: NOT DETECTED Copies/mL
HIV-1 RNA Quant, Log: NOT DETECTED Log cps/mL

## 2021-11-01 LAB — RPR TITER: RPR Titer: 1:2 {titer} — ABNORMAL HIGH

## 2021-11-02 ENCOUNTER — Ambulatory Visit: Payer: Medicaid Other

## 2021-11-03 ENCOUNTER — Ambulatory Visit: Payer: PPO

## 2021-11-03 ENCOUNTER — Other Ambulatory Visit: Payer: Self-pay

## 2021-11-03 DIAGNOSIS — B20 Human immunodeficiency virus [HIV] disease: Secondary | ICD-10-CM

## 2021-11-06 LAB — CT/NG RNA, TMA RECTAL
Chlamydia Trachomatis RNA: NOT DETECTED
Neisseria Gonorrhoeae RNA: NOT DETECTED

## 2021-11-07 ENCOUNTER — Encounter: Payer: Medicaid Other | Admitting: Internal Medicine

## 2021-11-09 ENCOUNTER — Telehealth: Payer: Self-pay

## 2021-11-09 NOTE — Telephone Encounter (Signed)
Patient requesting lab results

## 2021-11-10 ENCOUNTER — Telehealth: Payer: Self-pay

## 2021-11-10 DIAGNOSIS — E1165 Type 2 diabetes mellitus with hyperglycemia: Secondary | ICD-10-CM

## 2021-11-10 MED ORDER — TRESIBA FLEXTOUCH 100 UNIT/ML ~~LOC~~ SOPN: 30.0000 [IU] | PEN_INJECTOR | Freq: Every day | SUBCUTANEOUS | 0 refills | Status: DC

## 2021-11-10 NOTE — Telephone Encounter (Signed)
Sample med handed to patient today by Dr. Jimmye Norman.

## 2021-11-10 NOTE — Telephone Encounter (Signed)
Patient notified he may p/u sample today before noon. States he is on his way.

## 2021-11-10 NOTE — Telephone Encounter (Signed)
Patient's last OV 05/23/21 with 3 month return request. He has scheduled an appt for 11/20/21 with [facility] Team.  We do have a sample of Tresiba 100 units/mL.   1 box = 5 pens  Lot [id number]  Exp date 06/01/2023

## 2021-11-10 NOTE — Telephone Encounter (Signed)
Pt states the pharmacy do not have insulin degludec (TRESIBA FLEXTOUCH) 100 UNIT/ML FlexTouch Pen in stock. Want to know if he can get a sample on this medication. Please call pt back.

## 2021-11-10 NOTE — Telephone Encounter (Signed)
Patient aware of results.   Wayne Wolfe, CMA  

## 2021-11-20 ENCOUNTER — Encounter: Payer: Self-pay | Admitting: Internal Medicine

## 2021-11-20 ENCOUNTER — Ambulatory Visit (INDEPENDENT_AMBULATORY_CARE_PROVIDER_SITE_OTHER): Payer: PPO | Admitting: Internal Medicine

## 2021-11-20 VITALS — BP 134/82 | HR 95 | Temp 98.2°F | Ht 66.0 in | Wt 245.7 lb

## 2021-11-20 DIAGNOSIS — R911 Solitary pulmonary nodule: Secondary | ICD-10-CM | POA: Diagnosis not present

## 2021-11-20 DIAGNOSIS — E1159 Type 2 diabetes mellitus with other circulatory complications: Secondary | ICD-10-CM | POA: Diagnosis not present

## 2021-11-20 DIAGNOSIS — G4733 Obstructive sleep apnea (adult) (pediatric): Secondary | ICD-10-CM

## 2021-11-20 DIAGNOSIS — I152 Hypertension secondary to endocrine disorders: Secondary | ICD-10-CM

## 2021-11-20 DIAGNOSIS — E1165 Type 2 diabetes mellitus with hyperglycemia: Secondary | ICD-10-CM

## 2021-11-20 LAB — GLUCOSE, CAPILLARY: Glucose-Capillary: 171 mg/dL — ABNORMAL HIGH (ref 70–99)

## 2021-11-20 LAB — POCT GLYCOSYLATED HEMOGLOBIN (HGB A1C): Hemoglobin A1C: 9.5 % — AB (ref 4.0–5.6)

## 2021-11-20 MED ORDER — TRULICITY 3 MG/0.5ML ~~LOC~~ SOAJ: 3.0000 mg | SUBCUTANEOUS | 0 refills | Status: DC

## 2021-11-20 MED ORDER — ACCU-CHEK GUIDE W/DEVICE KIT: 1.0000 | PACK | Freq: Every day | 0 refills | Status: AC

## 2021-11-20 NOTE — Assessment & Plan Note (Addendum)
STOPBANG score 6, high risk for moderate to severe OSA. Patient will need sleep study prior to initiation of CPAP.   - split night study ordered

## 2021-11-20 NOTE — Patient Instructions (Addendum)
Mr Wayne Wolfe,  It was a pleasure seeing you in clinic. Today we discussed:   Diabetes: Your A1c is 9.5 at this visit. At this time, I have sent an increased dosing of the Trulicity to your pharmacy.  I have sent a glucose meter prescription to your pharmacy. Please check your blood sugars 3-4 times daily, especially when you feel like it is low. Bring your glucose meter to your next appointment in 2 weeks for insulin adjustments. Continue Tresiba 30U daily and Metformin daily.  Referral to ophthalmology placed at this visit   Sleep apnea:  Order placed for sleep study. Please schedule this at your earliest convenience   Lung nodule:  Order placed for CT Chest to follow up on this. Please schedule at your earliest convenience  You are due for your Shingles vaccine - please get this at your local pharmacy at your earliest convenience   If you have any questions or concerns, please call our clinic at [phone] between 9am-5pm and after hours call [phone] and ask for the internal medicine resident on call. If you feel you are having a medical emergency please call 911.   Thank you, we look forward to helping you remain healthy!

## 2021-11-20 NOTE — Progress Notes (Signed)
° °  CC: diabetes follow up   HPI:  [name] is a 56 y.o. male with PMHx as stated below presenting for follow up of his diabetes. Please see problem based charting for complete assessment and plan.   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  Physical Exam:  Vitals:   11/20/21 0958  BP: 134/82  Pulse: 95  Temp: 98.2 F (36.8 C)  TempSrc: Oral  SpO2: 97%  Weight: 245 lb 11.2 oz (111.4 kg)  Height: 5\' 6"  (1.676 m)   Physical Exam  Constitutional: Middle aged obese male, no acute distress.  Cardiovascular: Normal rate, regular rhythm, S1 and S2 present, no murmurs, rubs, gallops.  Distal pulses intact Respiratory: No respiratory distress, Lungs are clear to auscultation bilaterally. Musculoskeletal: Normal bulk and tone.  No peripheral edema noted. Neurological: Is alert and oriented x4, no apparent focal deficits noted. Skin: Warm and dry.  No rash, erythema, lesions noted. Psychiatric: Normal mood and affect.   Assessment & Plan:   See Encounters Tab for problem based charting.  Patient discussed with Dr. Dareen Piano

## 2021-11-20 NOTE — Assessment & Plan Note (Signed)
BP Readings from Last 3 Encounters:  11/20/21 134/82  10/30/21 121/81  05/23/21 131/81   Plan:  Continue lisinopril 20mg  daily

## 2021-11-20 NOTE — Assessment & Plan Note (Signed)
HbA1c increased to 9.5 from 8.4 six months ago. Patient is currently on metformin 1000mg  twice daily, Tresiba 79X daily, and Trulicity 1.5mg  weekly. He has not been able to check his blood sugars over the past two weeks due to meter malfunction. He reports that when he was checking, his blood sugars averaged 160-200s; however, he did note that he had episodes of shakiness, sweating and feeling unwell that improved with oral intake - however, did not check his glucose during these episodes.  Of note, patient does report that his pharmacy has had [place] on back order and he had been rationing out his insulin. He was able to get some insulin from our sample last month and notes having enough at this time. Patient advised to contact us if this happens again and we can either provide samples or adjust his basal insulin regimen.   Plan: Increase Trulicity to 3mg  weekly Continue Tresiba 30U daily and Metformin 1000mg  bid  Prescription for blood glucose monitor sent to pharmacy  Patient advised to monitor blood glucose at home 3-4 times daily  Return in 2 weeks for insulin adjustment Referral to ophthalmology

## 2021-11-20 NOTE — Assessment & Plan Note (Signed)
70mm pulmonary nodule in right lower lobe initially noted on CT Chest in November 2019; repeat CT chest in 2020 noted it to be stable in size; recommend for 2 year follow up.  Plan: Repeat CT chest wo contrast

## 2021-11-22 NOTE — Progress Notes (Signed)
Internal Medicine Clinic Attending ° °Case discussed with Dr. Aslam  At the time of the visit.  We reviewed the resident’s history and exam and pertinent patient test results.  I agree with the assessment, diagnosis, and plan of care documented in the resident’s note.  °

## 2021-11-30 ENCOUNTER — Other Ambulatory Visit: Payer: Self-pay

## 2021-11-30 ENCOUNTER — Ambulatory Visit (HOSPITAL_COMMUNITY)
Admission: RE | Admit: 2021-11-30 | Discharge: 2021-11-30 | Disposition: A | Discharge status: Home / Self Care | Payer: PPO | Source: Ambulatory Visit | Attending: Internal Medicine | Admitting: Internal Medicine

## 2021-11-30 DIAGNOSIS — R911 Solitary pulmonary nodule: Secondary | ICD-10-CM | POA: Diagnosis not present

## 2021-12-04 ENCOUNTER — Encounter: Payer: Self-pay | Admitting: Student

## 2021-12-04 ENCOUNTER — Other Ambulatory Visit (HOSPITAL_COMMUNITY): Payer: Self-pay

## 2021-12-04 ENCOUNTER — Ambulatory Visit (INDEPENDENT_AMBULATORY_CARE_PROVIDER_SITE_OTHER): Payer: PPO | Admitting: Student

## 2021-12-04 ENCOUNTER — Other Ambulatory Visit: Payer: Self-pay

## 2021-12-04 VITALS — BP 123/77 | HR 82 | Temp 97.7°F | Ht 66.0 in | Wt 245.2 lb

## 2021-12-04 DIAGNOSIS — G4733 Obstructive sleep apnea (adult) (pediatric): Secondary | ICD-10-CM | POA: Diagnosis not present

## 2021-12-04 DIAGNOSIS — E11649 Type 2 diabetes mellitus with hypoglycemia without coma: Secondary | ICD-10-CM | POA: Diagnosis not present

## 2021-12-04 DIAGNOSIS — I152 Hypertension secondary to endocrine disorders: Secondary | ICD-10-CM

## 2021-12-04 DIAGNOSIS — R911 Solitary pulmonary nodule: Secondary | ICD-10-CM

## 2021-12-04 DIAGNOSIS — E119 Type 2 diabetes mellitus without complications: Secondary | ICD-10-CM

## 2021-12-04 DIAGNOSIS — Z794 Long term (current) use of insulin: Secondary | ICD-10-CM | POA: Diagnosis not present

## 2021-12-04 DIAGNOSIS — E1159 Type 2 diabetes mellitus with other circulatory complications: Secondary | ICD-10-CM | POA: Diagnosis not present

## 2021-12-04 DIAGNOSIS — Z0000 Encounter for general adult medical examination without abnormal findings: Secondary | ICD-10-CM | POA: Insufficient documentation

## 2021-12-04 DIAGNOSIS — E1165 Type 2 diabetes mellitus with hyperglycemia: Secondary | ICD-10-CM

## 2021-12-04 MED ORDER — ZOSTER VAC RECOMB ADJUVANTED 50 MCG/0.5ML IM SUSR: 0.5000 mL | Freq: Once | INTRAMUSCULAR | 0 refills | Status: AC

## 2021-12-04 MED ORDER — TRULICITY 3 MG/0.5ML ~~LOC~~ SOAJ
3.0000 mg | SUBCUTANEOUS | 0 refills | Status: DC
  Filled 2021-12-04: qty 2, 28d supply, fill #0

## 2021-12-04 NOTE — Progress Notes (Signed)
? ?CC: follow up diabetes and episodes of hypoglycemia ? ?HPI: ? ?[name] is a 56 y.o. male with a past medical history stated below and presents today for follow-up after having multiple episodes of hypoglycemia. Please see problem based assessment and plan for additional details. ? ?Past Medical History:  ?Diagnosis Date  ? Acute respiratory failure with hypoxia ([place]) 01/12/2019  ? Allergy   ? seasonal  ? COVID-19 12/2018  ? Dizziness 03/13/2017  ? Flu-like symptoms 12/02/2018  ? Flu-like symptoms 12/02/2018  ? HIV (human immunodeficiency virus infection) ([place]) 2009  ? 11/20/2012 Last CD4 count 210 and VL <20  ? 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 Hypertension 2009  ? Well controlled on HCTZ  ? Hypokalemia 01/12/2019  ? Lobar pneumonia ([place]) 01/12/2019  ? Obesity 10/19/2020  ? Obstructive sleep apnea 10/19/2020  ? Otitis media 03/13/2017  ? Polyuria 03/13/2017  ? Poorly controlled diabetes mellitus ([place]) 03/13/2017  ? Type 2 diabetes mellitus ([place])   ? Well controlled on metformin  ? Weight loss 03/13/2017  ? ? ?Current Outpatient Medications on File Prior to Visit  ?Medication Sig Dispense Refill  ? ACCU-CHEK FASTCLIX LANCETS MISC Check blood sugar before meals and after meals 102 each 12  ? acetaminophen (TYLENOL) 500 MG tablet Take 1,000 mg by mouth every 6 (six) hours as needed for fever.    ? Blood Glucose Monitoring Suppl (ACCU-CHEK GUIDE) w/Device KIT 1 each by Does not apply route 6 (six) times daily. 1 kit 0  ? emtricitabine-rilpivir-tenofovir AF (ODEFSEY) 200-25-25 MG TABS tablet Take 1 tablet by mouth daily with breakfast. 30 tablet 11  ? fexofenadine (ALLEGRA) 180 MG tablet TAKE 1 TABLET(180 MG) BY MOUTH DAILY 30 tablet 5  ? gabapentin (NEURONTIN) 300 MG capsule Take 1 capsule (300 mg total) by mouth 3 (three) times daily. 90 capsule 2  ? glucose blood (ACCU-CHEK GUIDE) test strip Check blood sugar before meals and after meals 100 each 12  ? insulin degludec (TRESIBA FLEXTOUCH) 100  UNIT/ML FlexTouch Pen Inject 30 Units into the skin daily. 15 mL 0  ? Insulin Pen Needle (B-D UF III MINI PEN NEEDLES) 31G X 5 MM MISC USE AS DIRECTED 2 TIMES DAILY TO INJECT INSULIN UNDER THE SKIN 200 each 5  ? lisinopril (ZESTRIL) 20 MG tablet Take 1 tablet (20 mg total) by mouth daily. 90 tablet 3  ? metFORMIN (GLUMETZA) 1000 MG (MOD) 24 hr tablet TAKE 2 TABLETS(1000 MG) BY MOUTH TWICE DAILY 30 tablet 2  ? rosuvastatin (CRESTOR) 20 MG tablet TAKE 1 TABLET(20 MG) BY MOUTH DAILY 90 tablet 3  ? ?No current facility-administered medications on file prior to visit.  ? ? ?Family History  ?Problem Relation Age of Onset  ? Diabetes Mellitus II Sister   ? Colon polyps Sister   ? Diabetes Mellitus II Mother   ? Coronary artery disease Mother 69  ?     CABGx6  ? Coronary artery disease Sister 27  ?     CABG  ? Diabetes Mellitus II Sister   ? Coronary artery disease Sister   ? Diabetes Mellitus II Sister   ? Colon cancer Neg Hx   ? Esophageal cancer Neg Hx   ? Rectal cancer Neg Hx   ? Stomach cancer Neg Hx   ? ? ?Social History  ? ?Socioeconomic History  ? Marital status: Single  ?  Spouse name: Not on file  ? Number of children: Not on file  ?  Years of education: 37  ? Highest education level: Not on file  ?Occupational History  ?  Employer: UNEMPLOYED  ?Tobacco Use  ? Smoking status: Former  ?  Types: Cigarettes  ?  Quit date: 2003  ?  Years since quitting: 20.1  ? Smokeless tobacco: Never  ?Vaping Use  ? Vaping Use: Never used  ?Substance and Sexual Activity  ? Alcohol use: No  ?  Alcohol/week: 0.0 standard drinks  ?  Comment: Last used 2004  ? Drug use: No  ?  Comment: Last used 2003  ? Sexual activity: Yes  ?  Partners: Male  ?  Comment: accepted condoms  ?Other Topics Concern  ? Not on file  ?Social History Narrative  ? On disability. Lives in [place].   ? ?Social Determinants of Health  ? ?Financial Resource Strain: Not on file  ?Food Insecurity: Not on file  ?Transportation Needs: Not on file  ?Physical Activity:  Not on file  ?Stress: Not on file  ?Social Connections: Not on file  ?Intimate Partner Violence: Not on file  ? ? ?Review of Systems: ?ROS negative except for what is noted on the assessment and plan. ? ?Vitals:  ? 12/04/21 1023  ?BP: 123/77  ?Pulse: 82  ?Temp: 97.7 ?F (36.5 ?C)  ?TempSrc: Oral  ?SpO2: 97%  ?Weight: 245 lb 3.2 oz (111.2 kg)  ?Height: 5' 6"  (1.676 m)  ? ? ?Physical Exam: ?Constitutional: Well-appearing, no acute distress ?HENT: normocephalic atraumatic, mucous membranes moist ?Eyes: conjunctiva non-erythematous ?Neck: supple ?Pulmonary/Chest: normal work of breathing on room air ?Abdominal: soft, non-tender, non-distended ?MSK: normal bulk and tone.  No wounds of his feet ?Neurological: alert & oriented x 3 ?Skin: warm and dry ?Psych: Normal mood and thought process ? ?Assessment & Plan:  ? ?See Encounters Tab for problem based charting. ? ?Patient discussed with Dr. Cain Sieve ? ?Sanjuana Letters, D.O. ?[facility], PGY-2 ?Pager: [phone], Phone: [phone] ?Date 12/04/2021 Time 11:07 AM  ?

## 2021-12-04 NOTE — Assessment & Plan Note (Signed)
Shingles vaccine prescription given to the patient. ?

## 2021-12-04 NOTE — Patient Instructions (Addendum)
Thank you, [name] for allowing [place] to provide your care today. Today we discussed . ? ?Diabetes ?Your sugar levels that you brought in look great. I am glad to hear that you are not having any episodes of low blood sugars. If you have feelings of dizziness, lightheadedness or think your sugar is low. Please check it immediately. If these occur  please call our clinic to be evaluated. I have placed you a referral to podiatry   ? ?I have also sent your trulicity prescription to the [facility]. They do have the 3 mg dosing available.  ? ?Healthcare maintenance ?Please follow up with the eye doctor as well as for your sleep study.  ? ?I have ordered the following labs for you: ? ?Lab Orders  ?No laboratory test(s) ordered today  ?  ? ? ?Referrals ordered today:  ? ? ?Referral Orders    ?     Ambulatory referral to Podiatry     ? ?I have ordered the following medication/changed the following medications:  ? ?Stop the following medications: ?Medications Discontinued During This Encounter  ?Medication Reason  ? Dulaglutide (TRULICITY) 3 LK/4.4WN SOPN Reorder  ?  ? ?Start the following medications: ?Meds ordered this encounter  ?Medications  ? Dulaglutide (TRULICITY) 3 UU/7.2ZD SOPN  ?  Sig: Inject 3 mg as directed once a week.  ?  Dispense:  2 mL  ?  Refill:  0  ?  ? ?Follow up: 2 months  ? ?Should you have any questions or concerns please call the internal medicine clinic at [phone].   ? ?Sanjuana Letters, D.O. ?[place] ?  ?

## 2021-12-04 NOTE — Assessment & Plan Note (Addendum)
Assessment: ?Last A1c of 9.5.  Recently evaluated in our clinic and had his Trulicity increased to 3 mg and continued on Tresiba 30 units daily metformin 1000 mg twice daily.  He unfortunately was unable to pick up the Trulicity as it was out of stock.  He denies feeling as though he has had low blood sugar since.  He brought in readings and forgot to bring in his glucometer.  His morning glucose readings have been in the low 100s he is and within range.  States he has been eating more oatmeal throughout the day and has not had episodes of hypoglycemia.  He is doing well otherwise, did endorse some increased urinary frequency for you days but this has resolved.  Denies any discharge or burning with urination.  Suspect that he may have had the episodes of hyperglycemia that is now resolved. ? ?I reached out to the [facility] outpatient pharmacy who do have Trulicity 3 mg available.  We discussed that this may cause him to have low blood sugars to call us if this occurs. ? ?Plan: ?-Follow-up A1c in 2 months ?-Continue Tresiba 30 units daily, metformin at 1000 mg twice daily.  Decrease long-acting insulin if hypoglycemic events occur with increasing Trulicity ?-Trulicity 3 mg, instructed patient about GI side effects and to call our clinic if they become constant ?-Foot exam performed negative.  Referral placed to podiatry ?-Last visit referral placed to ophthalmology, patient to call them back ?

## 2021-12-04 NOTE — Assessment & Plan Note (Signed)
Current regimen of lisinopril 20 mg daily.  Blood pressure today 123/77.  Continue to monitor with annual BMP checks ?

## 2021-12-04 NOTE — Assessment & Plan Note (Signed)
Results of CT scan reviewed with patient.  Repeat imaging showed a stable nodule that is suspected to be benign in appearance. ?

## 2021-12-04 NOTE — Assessment & Plan Note (Signed)
Sleep study placed at his last visit as procedure rather than referral.  New order placed ?

## 2021-12-04 NOTE — Progress Notes (Signed)
Internal Medicine Clinic Attending  Case discussed with Dr. Katsadouros  At the time of the visit.  We reviewed the resident's history and exam and pertinent patient test results.  I agree with the assessment, diagnosis, and plan of care documented in the resident's note.  

## 2021-12-12 ENCOUNTER — Other Ambulatory Visit (HOSPITAL_COMMUNITY): Payer: Self-pay

## 2021-12-19 DIAGNOSIS — K6282 Dysplasia of anus: Secondary | ICD-10-CM | POA: Diagnosis not present

## 2021-12-29 ENCOUNTER — Telehealth: Payer: Self-pay

## 2021-12-29 NOTE — Telephone Encounter (Signed)
Pt is requesting a call back  .. he stated that he still cant get his medicine and the last time he was able to come here and collect a sample supply ( TRESIBA  )  ?

## 2022-01-01 ENCOUNTER — Telehealth: Payer: Self-pay

## 2022-01-01 ENCOUNTER — Ambulatory Visit (INDEPENDENT_AMBULATORY_CARE_PROVIDER_SITE_OTHER): Payer: PPO | Admitting: Podiatry

## 2022-01-01 DIAGNOSIS — M79674 Pain in right toe(s): Secondary | ICD-10-CM | POA: Diagnosis not present

## 2022-01-01 DIAGNOSIS — E0843 Diabetes mellitus due to underlying condition with diabetic autonomic (poly)neuropathy: Secondary | ICD-10-CM | POA: Diagnosis not present

## 2022-01-01 DIAGNOSIS — B351 Tinea unguium: Secondary | ICD-10-CM

## 2022-01-01 DIAGNOSIS — M79675 Pain in left toe(s): Secondary | ICD-10-CM | POA: Diagnosis not present

## 2022-01-01 NOTE — Telephone Encounter (Signed)
Medication Samples will be provided to the patient. ? ?Drug name: Wayne Wolfe    Strength: 100 units/mL        Qty: 1 box with 5 pens LOT: [id number] Exp.Date: 01/29/2024 ? ?Dosing instructions: Inject 30 Units into the skin daily. - Subcutaneous ? ?Patient notified. He will p/u sample tomorrow AM ?

## 2022-01-01 NOTE — Progress Notes (Signed)
? ?  SUBJECTIVE ?Patient with a history of diabetes mellitus presents to office today complaining of elongated, thickened nails that cause pain while ambulating in shoes.  Patient is unable to trim their own nails.  Patient is diabetic and also presents for routine diabetic foot exam.  No significant complaints.  Patient is here for further evaluation and treatment. ? ? ?Past Medical History:  ?Diagnosis Date  ? Acute respiratory failure with hypoxia ([place]) 01/12/2019  ? Allergy   ? seasonal  ? COVID-19 12/2018  ? Dizziness 03/13/2017  ? Flu-like symptoms 12/02/2018  ? Flu-like symptoms 12/02/2018  ? HIV (human immunodeficiency virus infection) ([place]) 2009  ? 11/20/2012 Last CD4 count 210 and VL <20  ? 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 Hypertension 2009  ? Well controlled on HCTZ  ? Hypokalemia 01/12/2019  ? Lobar pneumonia ([place]) 01/12/2019  ? Obesity 10/19/2020  ? Obstructive sleep apnea 10/19/2020  ? Otitis media 03/13/2017  ? Polyuria 03/13/2017  ? Poorly controlled diabetes mellitus ([place]) 03/13/2017  ? Type 2 diabetes mellitus ([place])   ? Well controlled on metformin  ? Weight loss 03/13/2017  ? ? ?OBJECTIVE ?General Patient is awake, alert, and oriented x 3 and in no acute distress. ?Derm Skin is dry and supple bilateral. Negative open lesions or macerations. Remaining integument unremarkable. Nails are tender, long, thickened and dystrophic with subungual debris, consistent with onychomycosis, 1-5 bilateral. No signs of infection noted. ?Vasc  DP and PT pedal pulses palpable bilaterally. Temperature gradient within normal limits.  ?Neuro Epicritic and protective threshold sensation diminished bilaterally.  ?Musculoskeletal Exam No symptomatic pedal deformities noted bilateral. Muscular strength within normal limits. ? ?ASSESSMENT ?1. Diabetes Mellitus w/ peripheral neuropathy ?2.  Pain due to onychomycosis of toenails bilateral ? ?PLAN OF CARE ?1. Patient evaluated today.  Comprehensive diabetic foot exam  performed today ?2. Instructed to maintain good pedal hygiene and foot care. Stressed importance of controlling blood sugar.  ?3. Mechanical debridement of nails 1-5 bilaterally performed using a nail nipper. Filed with dremel without incident.  ?4. Return to clinic in 3 mos.  ? ? ? ?Edrick Kins, DPM ?[place] ? ?Dr. Edrick Kins, DPM  ?  ?[address]                                      ?[place], [place] [address]                ?Office [phone]  ?Fax [phone] ? ? ? ? ? ?

## 2022-01-01 NOTE — Telephone Encounter (Signed)
Pt states [facility] is out of sock for insulin degludec (TRESIBA FLEXTOUCH) 100 UNIT/ML FlexTouch Pen . Requesting a samples from the clinic. Please call pt back.  ?

## 2022-01-03 ENCOUNTER — Other Ambulatory Visit (HOSPITAL_COMMUNITY): Payer: Self-pay

## 2022-01-12 ENCOUNTER — Institutional Professional Consult (permissible substitution): Payer: PPO | Admitting: Acute Care

## 2022-01-12 DIAGNOSIS — R85612 Low grade squamous intraepithelial lesion on cytologic smear of anus (LGSIL): Secondary | ICD-10-CM | POA: Diagnosis not present

## 2022-01-12 DIAGNOSIS — K6282 Dysplasia of anus: Secondary | ICD-10-CM | POA: Diagnosis not present

## 2022-01-19 ENCOUNTER — Institutional Professional Consult (permissible substitution): Payer: PPO | Admitting: Acute Care

## 2022-01-24 ENCOUNTER — Other Ambulatory Visit: Payer: Self-pay | Admitting: Student

## 2022-01-24 DIAGNOSIS — E1165 Type 2 diabetes mellitus with hyperglycemia: Secondary | ICD-10-CM

## 2022-02-05 ENCOUNTER — Encounter: Payer: PPO | Admitting: Student

## 2022-02-07 ENCOUNTER — Encounter: Payer: PPO | Admitting: Student

## 2022-02-08 ENCOUNTER — Encounter: Payer: Self-pay | Admitting: Student

## 2022-02-09 ENCOUNTER — Telehealth: Payer: Self-pay

## 2022-02-09 NOTE — Telephone Encounter (Signed)
Patient called complaining of post nasal drip and hoarseness. Patient denies any fever or any other symptoms. I advised the patient that this sounds like it is related to his allergies. Patient stated he had not tried any OTC medication to relieve his symptoms and was requesting that Dr. Tommy Medal send in medication for his symptoms. I advised the patient to try OTC allergy medication first to see if that helps. Patient verbalized understanding  ?

## 2022-02-11 ENCOUNTER — Other Ambulatory Visit: Payer: Self-pay | Admitting: Student

## 2022-02-11 DIAGNOSIS — E1159 Type 2 diabetes mellitus with other circulatory complications: Secondary | ICD-10-CM

## 2022-02-12 NOTE — Telephone Encounter (Signed)
Next appt scheduled 5/17 with Dr Lisabeth Devoid. ?

## 2022-02-14 ENCOUNTER — Ambulatory Visit (INDEPENDENT_AMBULATORY_CARE_PROVIDER_SITE_OTHER): Payer: PPO | Admitting: Student

## 2022-02-14 ENCOUNTER — Other Ambulatory Visit: Payer: Self-pay

## 2022-02-14 ENCOUNTER — Encounter: Payer: Self-pay | Admitting: Student

## 2022-02-14 VITALS — BP 128/83 | HR 90 | Temp 98.1°F | Ht 66.0 in | Wt 243.0 lb

## 2022-02-14 DIAGNOSIS — Z0000 Encounter for general adult medical examination without abnormal findings: Secondary | ICD-10-CM

## 2022-02-14 DIAGNOSIS — Z9109 Other allergy status, other than to drugs and biological substances: Secondary | ICD-10-CM

## 2022-02-14 DIAGNOSIS — E1165 Type 2 diabetes mellitus with hyperglycemia: Secondary | ICD-10-CM | POA: Diagnosis not present

## 2022-02-14 DIAGNOSIS — E119 Type 2 diabetes mellitus without complications: Secondary | ICD-10-CM

## 2022-02-14 DIAGNOSIS — E1169 Type 2 diabetes mellitus with other specified complication: Secondary | ICD-10-CM

## 2022-02-14 DIAGNOSIS — E785 Hyperlipidemia, unspecified: Secondary | ICD-10-CM

## 2022-02-14 DIAGNOSIS — K6282 Dysplasia of anus: Secondary | ICD-10-CM | POA: Diagnosis not present

## 2022-02-14 DIAGNOSIS — Z7984 Long term (current) use of oral hypoglycemic drugs: Secondary | ICD-10-CM | POA: Diagnosis not present

## 2022-02-14 DIAGNOSIS — Z794 Long term (current) use of insulin: Secondary | ICD-10-CM | POA: Diagnosis not present

## 2022-02-14 DIAGNOSIS — J301 Allergic rhinitis due to pollen: Secondary | ICD-10-CM

## 2022-02-14 LAB — POCT GLYCOSYLATED HEMOGLOBIN (HGB A1C): Hemoglobin A1C: 11.8 % — AB (ref 4.0–5.6)

## 2022-02-14 LAB — GLUCOSE, CAPILLARY: Glucose-Capillary: 344 mg/dL — ABNORMAL HIGH (ref 70–99)

## 2022-02-14 MED ORDER — ACCU-CHEK FASTCLIX LANCETS MISC: 12 refills | Status: AC

## 2022-02-14 MED ORDER — FEXOFENADINE HCL 180 MG PO TABS: ORAL_TABLET | ORAL | 5 refills | Status: DC

## 2022-02-14 NOTE — Patient Instructions (Addendum)
Diabetes ?Please an appointment with the eye doctor ?Continue your current medications ? ?Allergies  ?Try allegra, Claritin, or zyrtec ?Continue using steroid nasal sprays to help with congestion ?Sinus rinses can also help with symptoms ? ?Follow up in 3 months ? ? ? ?

## 2022-02-15 MED ORDER — BD PEN NEEDLE MINI U/F 31G X 5 MM MISC: 5 refills | Status: DC

## 2022-02-15 MED ORDER — TRULICITY 3 MG/0.5ML ~~LOC~~ SOAJ: 3.0000 mg | SUBCUTANEOUS | 0 refills | Status: DC

## 2022-02-16 DIAGNOSIS — Z9109 Other allergy status, other than to drugs and biological substances: Secondary | ICD-10-CM | POA: Insufficient documentation

## 2022-02-16 NOTE — Assessment & Plan Note (Signed)
Not called ophthalmologist to set up an appointment he does have the phone number and will call to make a follow-up

## 2022-02-16 NOTE — Assessment & Plan Note (Signed)
Last check in January, continue rosuvastatin 20 mg daily

## 2022-02-16 NOTE — Assessment & Plan Note (Signed)
[facility].  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  He is to follow-up with ID for long-term treatment of this.

## 2022-02-16 NOTE — Progress Notes (Signed)
Established Patient Office Visit  Subjective   Patient ID: Wayne Wolfe, male    DOB: [birth year]  Age: 56 y.o. MRN: [id number]  Chief Complaint  Patient presents with   Follow-up   Diabetes   Hypertension   Sinus Problem    Pt stated that he has allergies and has notice some congestion going on  for a week now     Wayne Wolfe is a 56 year old man who presents today for follow-up of his diabetes.Please refer to problem based charting for further details and assessment and plan of current problem and chronic medical conditions.    Patient Active Problem List   Diagnosis Date Noted   Environmental allergies 02/16/2022   Healthcare maintenance 12/04/2021   Obstructive sleep apnea 10/19/2020   Obesity 10/19/2020   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   Multiple joint pain 02/11/2020   Syphilis 12/14/2019   Unprotected sexual intercourse 12/01/2019   Pulmonary nodule 12/26/2017   Routine screening for STI (sexually transmitted infection) 10/16/2017   Knee effusion, right 07/19/2017   Atypical chest pain 03/20/2017   Otitis media 03/13/2017   Colon cancer screening 02/24/2016   Testicular pain 01/20/2016   Numbness and tingling of both feet 08/26/2013   Keloid of skin 06/25/2012   Type 2 diabetes mellitus ([place]) 03/12/2011   Hyperlipidemia associated with type 2 diabetes mellitus ([place]) 03/12/2011   Hypertension associated with diabetes ([place]) 04/20/2008      Review of Systems  All other systems reviewed and are negative.    Objective:     BP 128/83 (BP Location: Left Arm, Patient Position: Sitting, Cuff Size: Normal)   Pulse 90   Temp 98.1 F (36.7 C) (Oral)   Ht '5\' 6"'$  (1.676 m)   Wt 243 lb (110.2 kg)   SpO2 98%   BMI 39.22 kg/m  BP Readings from Last 3 Encounters:  02/14/22 128/83  12/04/21 123/77  11/20/21 134/82      Physical Exam Constitutional:      Appearance: Normal appearance.  HENT:     Head: Normocephalic and atraumatic.  Eyes:      Conjunctiva/sclera: Conjunctivae normal.     Pupils: Pupils are equal, round, and reactive to light.  Cardiovascular:     Rate and Rhythm: Normal rate and regular rhythm.  Pulmonary:     Effort: Pulmonary effort is normal.     Breath sounds: Normal breath sounds. No rhonchi or rales.  Abdominal:     General: Abdomen is flat. Bowel sounds are normal. There is no distension.     Palpations: Abdomen is soft.     Tenderness: There is no abdominal tenderness.  Musculoskeletal:     Right lower leg: No edema.     Left lower leg: No edema.  Skin:    General: Skin is warm and dry.  Neurological:     General: No focal deficit present.     Mental Status: He is alert and oriented to person, place, and time.     Results for orders placed or performed in visit on 02/14/22  Glucose, capillary  Result Value Ref Range   Glucose-Capillary 344 (H) 70 - 99 mg/dL  POC Hbg A1C  Result Value Ref Range   Hemoglobin A1C 11.8 (A) 4.0 - 5.6 %   HbA1c POC (<> result, manual entry)     HbA1c, POC (prediabetic range)     HbA1c, POC (controlled diabetic range)      Last hemoglobin A1c Lab Results  Component Value Date  HGBA1C 11.8 (A) 02/14/2022      The ASCVD Risk score (Arnett DK, et al., 2019) failed to calculate for the following reasons:   The valid total cholesterol range is 130 to 320 mg/dL    Assessment & Plan:   Problem List Items Addressed This Visit       Digestive   Anal dysplasia (Chronic)    [facility]  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  He is to follow-up with ID for long-term treatment of this.         Endocrine   Type 2 diabetes mellitus (HCC) - Primary    A1c today is 11.8 increased from 9.53 months ago.  He did not bring his glucose meter in today but reports fasting blood sugars in the morning of 1 30-1 40.  Patient initially reports he has been taking all his diabetes medications.  On chart review appears he has not filled  many of his medications since prior to January.  On further discussion states he sometimes takes his [place] in the morning and sometimes at night.  He states if he does take it at night he does not give himself further insulin in the morning and restarts it the next morning.  He also frequently forgets to take evening dose of his metformin.  States he may be using 1.5 mg of Trulicity versus 3 mg but is not sure about this.  I discussed the importance of consistently taking his diabetes medications.  His metformin is also a long-acting formulation so instructed him to take 2000 mg once a day so he does not forget an evening dose.  We will work on medication consistency and follow-up diabetes in 2 weeks.  Tresiba 30 units in the morning Metformin 2000 mg 24-hour tablet once daily Trulicity 3 mg weekly, could increase at next visit if he has been been tolerating 3 mg weekly Refilled pen needles and lancets Follow-up in 2 weeks, reminded patient to bring his glucometer to his next visit         Relevant Medications   Accu-Chek FastClix Lancets MISC   Dulaglutide (TRULICITY) 3 JO/8.4ZY SOPN   Insulin Pen Needle (B-D UF III MINI PEN NEEDLES) 31G X 5 MM MISC   Other Relevant Orders   POC Hbg A1C (Completed)     Other   Healthcare maintenance    Not called ophthalmologist to set up an appointment he does have the phone number and will call to make a follow-up       Environmental allergies    Reports watery eyes, nasal congestion, and sneezing.  Reports previous history of having intermittent environmental allergies.  Used Allegra in the past which worked well.  Has not required this lately.  Discussed restarting Allegra or other antihistamine to help with allergy symptoms in addition to Flonase and nasal rinses.       Other Visit Diagnoses     Uncontrolled type 2 diabetes mellitus with hyperglycemia (HCC)  (Chronic)      Relevant Medications   Accu-Chek FastClix Lancets MISC    Dulaglutide (TRULICITY) 3 SA/6.3KZ SOPN   Insulin Pen Needle (B-D UF III MINI PEN NEEDLES) 31G X 5 MM MISC   Seasonal allergic rhinitis due to pollen       Relevant Medications   fexofenadine (ALLEGRA) 180 MG tablet       Return in about 3 months (around 05/17/2022).    Wayne Beard, MD

## 2022-02-16 NOTE — Assessment & Plan Note (Signed)
A1c today is 11.8 increased from 9.53 months ago.  He did not bring his glucose meter in today but reports fasting blood sugars in the morning of 1 30-1 40.  Patient initially reports he has been taking all his diabetes medications.  On chart review appears he has not filled many of his medications since prior to January.  On further discussion states he sometimes takes his [place] in the morning and sometimes at night.  He states if he does take it at night he does not give himself further insulin in the morning and restarts it the next morning.  He also frequently forgets to take evening dose of his metformin.  States he may be using 1.5 mg of Trulicity versus 3 mg but is not sure about this.  I discussed the importance of consistently taking his diabetes medications.  His metformin is also a long-acting formulation so instructed him to take 2000 mg once a day so he does not forget an evening dose.  We will work on medication consistency and follow-up diabetes in 2 weeks.  Tresiba 30 units in the morning Metformin 2000 mg 24-hour tablet once daily Trulicity 3 mg weekly, could increase at next visit if he has been been tolerating 3 mg weekly Refilled pen needles and lancets Follow-up in 2 weeks, reminded patient to bring his glucometer to his next visit

## 2022-02-16 NOTE — Assessment & Plan Note (Signed)
Reports watery eyes, nasal congestion, and sneezing.  Reports previous history of having intermittent environmental allergies.  Used Allegra in the past which worked well.  Has not required this lately.  Discussed restarting Allegra or other antihistamine to help with allergy symptoms in addition to Flonase and nasal rinses.

## 2022-02-20 NOTE — Progress Notes (Signed)
Internal Medicine Clinic Attending  Case discussed with the resident at the time of the visit.  We reviewed the resident's history and exam and pertinent patient test results.  I agree with the assessment, diagnosis, and plan of care documented in the resident's note.  

## 2022-04-09 ENCOUNTER — Other Ambulatory Visit: Payer: Medicaid Other

## 2022-04-16 DIAGNOSIS — Z79899 Other long term (current) drug therapy: Secondary | ICD-10-CM | POA: Diagnosis not present

## 2022-04-16 DIAGNOSIS — E785 Hyperlipidemia, unspecified: Secondary | ICD-10-CM | POA: Diagnosis not present

## 2022-04-16 DIAGNOSIS — K6282 Dysplasia of anus: Secondary | ICD-10-CM | POA: Diagnosis not present

## 2022-04-16 DIAGNOSIS — B20 Human immunodeficiency virus [HIV] disease: Secondary | ICD-10-CM | POA: Diagnosis not present

## 2022-04-16 DIAGNOSIS — E119 Type 2 diabetes mellitus without complications: Secondary | ICD-10-CM | POA: Diagnosis not present

## 2022-04-23 ENCOUNTER — Other Ambulatory Visit: Payer: HMO

## 2022-04-23 ENCOUNTER — Telehealth: Payer: Self-pay | Admitting: Student

## 2022-04-23 ENCOUNTER — Other Ambulatory Visit (HOSPITAL_COMMUNITY)
Admission: RE | Admit: 2022-04-23 | Discharge: 2022-04-23 | Disposition: A | Discharge status: Home / Self Care | Payer: PPO | Source: Ambulatory Visit | Attending: Infectious Disease | Admitting: Infectious Disease

## 2022-04-23 ENCOUNTER — Other Ambulatory Visit: Payer: Self-pay

## 2022-04-23 ENCOUNTER — Encounter: Payer: Self-pay | Admitting: Infectious Disease

## 2022-04-23 ENCOUNTER — Ambulatory Visit: Payer: PPO

## 2022-04-23 DIAGNOSIS — B20 Human immunodeficiency virus [HIV] disease: Secondary | ICD-10-CM

## 2022-04-23 DIAGNOSIS — E785 Hyperlipidemia, unspecified: Secondary | ICD-10-CM | POA: Diagnosis not present

## 2022-04-23 NOTE — Telephone Encounter (Signed)
Pt states he call the pharmacy for the following medication and they are out of stock.  The patient is asking for samples.  Please call the patient back.  TRESIBA FLEXTOUCH 100 UNIT/ML FlexTouch Pen    [id number] - [place], [place] - [address]

## 2022-04-24 ENCOUNTER — Encounter: Payer: Medicaid Other | Admitting: Infectious Disease

## 2022-04-24 LAB — URINE CYTOLOGY ANCILLARY ONLY
Chlamydia: NEGATIVE
Comment: NEGATIVE
Comment: NORMAL
Neisseria Gonorrhea: NEGATIVE

## 2022-04-24 LAB — T-HELPER CELL (CD4) - (RCID CLINIC ONLY)
CD4 % Helper T Cell: 23 % — ABNORMAL LOW (ref 33–65)
CD4 T Cell Abs: 451 /uL (ref 400–1790)

## 2022-04-25 ENCOUNTER — Telehealth: Payer: Self-pay

## 2022-04-25 NOTE — Telephone Encounter (Signed)
Pa for pt ( [employer] ) came through via fax from pharmacy .Marland Kitchen Was submitted  with last office visit and labs on cover my meds .Marland Kitchen Awaiting approval or denial

## 2022-04-25 NOTE — Telephone Encounter (Signed)
DECISION :    APPROVED   COVERAGE START DATE :04/25/2022   COVERAGE END DATE 09/30/2022     ( COPY SENT TO PHARMACY ALSO )

## 2022-04-28 LAB — LIPID PANEL
Cholesterol: 89 mg/dL (ref <200)
HDL: 27 mg/dL — ABNORMAL LOW (ref >=40)
LDL Cholesterol (Calc): 47 mg/dL (calc)
Non-HDL Cholesterol (Calc): 62 mg/dL (calc) (ref <130)
Total CHOL/HDL Ratio: 3.3 (calc) (ref <5.0)
Triglycerides: 73 mg/dL (ref <150)

## 2022-04-28 LAB — CBC WITH DIFFERENTIAL/PLATELET
Absolute Monocytes: 401 cells/uL (ref 200–950)
Basophils Absolute: 59 cells/uL (ref 0–200)
Basophils Relative: 1.3 %
Eosinophils Absolute: 131 cells/uL (ref 15–500)
Eosinophils Relative: 2.9 %
HCT: 46.3 % (ref 38.5–50.0)
Hemoglobin: 15.7 g/dL (ref 13.2–17.1)
Lymphs Abs: 2309 cells/uL (ref 850–3900)
MCH: 30.3 pg (ref 27.0–33.0)
MCHC: 33.9 g/dL (ref 32.0–36.0)
MCV: 89.4 fL (ref 80.0–100.0)
MPV: 9.2 fL (ref 7.5–12.5)
Monocytes Relative: 8.9 %
Neutro Abs: 1602 cells/uL (ref 1500–7800)
Neutrophils Relative %: 35.6 %
Platelets: 280 10*3/uL (ref 140–400)
RBC: 5.18 10*6/uL (ref 4.20–5.80)
RDW: 13.3 % (ref 11.0–15.0)
Total Lymphocyte: 51.3 %
WBC: 4.5 10*3/uL (ref 3.8–10.8)

## 2022-04-28 LAB — COMPLETE METABOLIC PANEL WITH GFR
AG Ratio: 1.6 (calc) (ref 1.0–2.5)
ALT: 15 U/L (ref 9–46)
AST: 10 U/L (ref 10–35)
Albumin: 3.9 g/dL (ref 3.6–5.1)
Alkaline phosphatase (APISO): 64 U/L (ref 35–144)
BUN: 14 mg/dL (ref 7–25)
CO2: 26 mmol/L (ref 20–32)
Calcium: 8.6 mg/dL (ref 8.6–10.3)
Chloride: 105 mmol/L (ref 98–110)
Creat: 1.05 mg/dL (ref 0.70–1.30)
Globulin: 2.5 g/dL (calc) (ref 1.9–3.7)
Glucose, Bld: 211 mg/dL — ABNORMAL HIGH (ref 65–99)
Potassium: 4 mmol/L (ref 3.5–5.3)
Sodium: 138 mmol/L (ref 135–146)
Total Bilirubin: 0.4 mg/dL (ref 0.2–1.2)
Total Protein: 6.4 g/dL (ref 6.1–8.1)
eGFR: 83 mL/min/{1.73_m2} (ref >=60)

## 2022-04-28 LAB — FLUORESCENT TREPONEMAL AB(FTA)-IGG-BLD: Fluorescent Treponemal ABS: REACTIVE — AB

## 2022-04-28 LAB — HIV-1 RNA QUANT-NO REFLEX-BLD
HIV 1 RNA Quant: NOT DETECTED Copies/mL
HIV-1 RNA Quant, Log: NOT DETECTED Log cps/mL

## 2022-04-28 LAB — RPR TITER: RPR Titer: 1:1 {titer} — ABNORMAL HIGH

## 2022-04-28 LAB — RPR: RPR Ser Ql: REACTIVE — AB

## 2022-05-02 ENCOUNTER — Ambulatory Visit: Payer: PPO

## 2022-05-02 ENCOUNTER — Encounter: Payer: PPO | Admitting: Student

## 2022-05-08 ENCOUNTER — Ambulatory Visit: Payer: PPO

## 2022-05-08 ENCOUNTER — Other Ambulatory Visit: Payer: Self-pay

## 2022-05-08 ENCOUNTER — Encounter: Payer: Self-pay | Admitting: Infectious Disease

## 2022-05-08 ENCOUNTER — Other Ambulatory Visit (HOSPITAL_COMMUNITY): Payer: Self-pay

## 2022-05-08 ENCOUNTER — Ambulatory Visit (INDEPENDENT_AMBULATORY_CARE_PROVIDER_SITE_OTHER): Payer: PPO | Admitting: Infectious Disease

## 2022-05-08 VITALS — BP 119/82 | HR 84 | Temp 97.3°F | Wt 239.0 lb

## 2022-05-08 DIAGNOSIS — K6282 Dysplasia of anus: Secondary | ICD-10-CM | POA: Diagnosis not present

## 2022-05-08 DIAGNOSIS — E1169 Type 2 diabetes mellitus with other specified complication: Secondary | ICD-10-CM | POA: Diagnosis not present

## 2022-05-08 DIAGNOSIS — E1159 Type 2 diabetes mellitus with other circulatory complications: Secondary | ICD-10-CM

## 2022-05-08 DIAGNOSIS — Z87891 Personal history of nicotine dependence: Secondary | ICD-10-CM

## 2022-05-08 DIAGNOSIS — E785 Hyperlipidemia, unspecified: Secondary | ICD-10-CM | POA: Diagnosis not present

## 2022-05-08 DIAGNOSIS — B20 Human immunodeficiency virus [HIV] disease: Secondary | ICD-10-CM

## 2022-05-08 DIAGNOSIS — Z794 Long term (current) use of insulin: Secondary | ICD-10-CM

## 2022-05-08 DIAGNOSIS — I152 Hypertension secondary to endocrine disorders: Secondary | ICD-10-CM

## 2022-05-08 MED ORDER — ODEFSEY 200-25-25 MG PO TABS: 1.0000 | ORAL_TABLET | Freq: Every day | ORAL | 11 refills | Status: DC

## 2022-05-08 NOTE — Progress Notes (Signed)
Chief complaint: " I have lost medicare" Subjective:    Patient ID: Wayne Wolfe, male    DOB: 04/25/1966, 56 y.o.   MRN: [id number]  HIV Positive/AIDS   56 year old man previously perfectly suppressed on Prezista Norvir and Truvada whom I changed over to Atripla and then to Complera to ODEFSEY, excellent virological control.   Wayne Wolfe is set up for repeat high resolution anoscopy over at [facility] but is worried now that he apparently lost Medicare --about whether he will be able to afford the visit.  I feel he still should be seen as we have sent other uninsured patients to that clinic as well.  I am fairly shocked that his Medicare was taken away but he says it was taken away when he was deemed to no longer have a disability.  He will need to meet with financial aid and make sure he is on the proper assistance program as the most recent note indicated SPAP      Past Medical History:  Diagnosis Date   Acute respiratory failure with hypoxia (HCC) 01/12/2019   Allergy    seasonal   COVID-19 12/2018   Dizziness 03/13/2017   Flu-like symptoms 12/02/2018   Flu-like symptoms 12/02/2018   HIV (human immunodeficiency virus infection) (HCC) 2009   11/20/2012 Last CD4 count 210 and VL <20   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   Hypertension 2009   Well controlled on HCTZ   Hypokalemia 01/12/2019   Lobar pneumonia (HCC) 01/12/2019   Obesity 10/19/2020   Obstructive sleep apnea 10/19/2020   Otitis media 03/13/2017   Polyuria 03/13/2017   Poorly controlled diabetes mellitus (HCC) 03/13/2017   Type 2 diabetes mellitus (HCC)    Well controlled on metformin   Weight loss 03/13/2017    Past Surgical History:  Procedure Laterality Date   CARPAL TUNNEL RELEASE Right 1990s   none      Family History  Problem Relation Age of Onset   Diabetes Mellitus II Sister    Colon polyps Sister    Diabetes Mellitus II Mother    Coronary artery disease Mother 66        CABGx6   Coronary artery disease Sister 63       CABG   Diabetes Mellitus II Sister    Coronary artery disease Sister    Diabetes Mellitus II Sister    Colon cancer Neg Hx    Esophageal cancer Neg Hx    Rectal cancer Neg Hx    Stomach cancer Neg Hx       Social History   Socioeconomic History   Marital status: Single    Spouse name: Not on file   Number of children: Not on file   Years of education: 13   Highest education level: Not on file  Occupational History    Employer: UNEMPLOYED  Tobacco Use   Smoking status: Former    Types: Cigarettes    Quit date: 2003    Years since quitting: 20.6   Smokeless tobacco: Never  Vaping Use   Vaping Use: Never used  Substance and Sexual Activity   Alcohol use: No    Alcohol/week: 0.0 standard drinks of alcohol    Comment: Last used 2004   Drug use: No    Comment: Last used 2003   Sexual activity: Yes    Partners: Male    Comment: accepted condoms  Other Topics Concern   Not on file  Social History   Narrative   On disability. Lives in [place].    Social Determinants of Health   Financial Resource Strain: Not on file  Food Insecurity: Not on file  Transportation Needs: Not on file  Physical Activity: Not on file  Stress: Not on file  Social Connections: Not on file    No Known Allergies   Current Outpatient Medications:    Accu-Chek FastClix Lancets MISC, Check blood sugar before meals and after meals, Disp: 102 each, Rfl: 12   acetaminophen (TYLENOL) 500 MG tablet, Take 1,000 mg by mouth every 6 (six) hours as needed for fever., Disp: , Rfl:    Blood Glucose Monitoring Suppl (ACCU-CHEK GUIDE) w/Device KIT, 1 each by Does not apply route 6 (six) times daily., Disp: 1 kit, Rfl: 0   Dulaglutide (TRULICITY) 3 IN/8.6VE SOPN, Inject 3 mg into the skin once a week as directed, Disp: 2 mL, Rfl: 0   emtricitabine-rilpivir-tenofovir AF (ODEFSEY) 200-25-25 MG TABS tablet, Take 1 tablet by mouth daily with breakfast., Disp:  30 tablet, Rfl: 11   fexofenadine (ALLEGRA) 180 MG tablet, TAKE 1 TABLET(180 MG) BY MOUTH DAILY, Disp: 30 tablet, Rfl: 5   gabapentin (NEURONTIN) 300 MG capsule, Take 1 capsule (300 mg total) by mouth 3 (three) times daily., Disp: 90 capsule, Rfl: 2   glucose blood (ACCU-CHEK GUIDE) test strip, Check blood sugar before meals and after meals, Disp: 100 each, Rfl: 12   Insulin Pen Needle (B-D UF III MINI PEN NEEDLES) 31G X 5 MM MISC, USE AS DIRECTED 2 TIMES DAILY TO INJECT INSULIN UNDER THE SKIN, Disp: 200 each, Rfl: 5   lisinopril (ZESTRIL) 20 MG tablet, TAKE 1 TABLET(20 MG) BY MOUTH DAILY, Disp: 90 tablet, Rfl: 3   metFORMIN (GLUMETZA) 1000 MG (MOD) 24 hr tablet, TAKE 2 TABLETS(1000 MG) BY MOUTH TWICE DAILY, Disp: 30 tablet, Rfl: 2   rosuvastatin (CRESTOR) 20 MG tablet, TAKE 1 TABLET(20 MG) BY MOUTH DAILY, Disp: 90 tablet, Rfl: 3   TRESIBA FLEXTOUCH 100 UNIT/ML FlexTouch Pen, ADMINISTER 30 UNITS UNDER THE SKIN DAILY, Disp: 15 mL, Rfl: 3    Review of Systems  Constitutional:  Negative for activity change, appetite change, chills, diaphoresis, fatigue, fever and unexpected weight change.  HENT:  Negative for congestion, rhinorrhea, sinus pressure, sneezing, sore throat and trouble swallowing.   Eyes:  Negative for photophobia and visual disturbance.  Respiratory:  Negative for cough, chest tightness, shortness of breath, wheezing and stridor.   Cardiovascular:  Negative for chest pain, palpitations and leg swelling.  Gastrointestinal:  Negative for abdominal distention, abdominal pain, anal bleeding, blood in stool, constipation, diarrhea, nausea and vomiting.  Genitourinary:  Negative for difficulty urinating, dysuria, flank pain and hematuria.  Musculoskeletal:  Negative for arthralgias, back pain, gait problem, joint swelling and myalgias.  Skin:  Negative for color change, pallor, rash and wound.  Neurological:  Negative for dizziness, tremors, weakness and light-headedness.  Hematological:   Negative for adenopathy. Does not bruise/bleed easily.  Psychiatric/Behavioral:  Negative for agitation, behavioral problems, confusion, decreased concentration, dysphoric mood and sleep disturbance.        Objective:   Physical Exam Constitutional:      Appearance: He is well-developed.  HENT:     Head: Normocephalic and atraumatic.  Eyes:     Conjunctiva/sclera: Conjunctivae normal.  Cardiovascular:     Rate and Rhythm: Normal rate and regular rhythm.  Pulmonary:     Effort: Pulmonary effort is normal. No respiratory distress.     Breath sounds: No  wheezing.  Abdominal:     General: There is no distension.     Palpations: Abdomen is soft.  Musculoskeletal:        General: No tenderness. Normal range of motion.     Cervical back: Normal range of motion and neck supple.  Skin:    General: Skin is warm and dry.     Coloration: Skin is not pale.     Findings: No erythema or rash.  Neurological:     General: No focal deficit present.     Mental Status: He is alert and oriented to person, place, and time.  Psychiatric:        Mood and Affect: Mood normal.        Behavior: Behavior normal.        Thought Content: Thought content normal.        Judgment: Judgment normal.           Assessment & Plan:   HIV disease:  I reviewed Wayne Wolfe's most recent viral load from July 24 and it was not detected and his CD4 count from same date which was 451  Continue his [facility] prescription with food and avoidance of an acids   There were no vitals filed for this visit.   Hyperlipidemia continue Crestor, LDL is 47  Lipid Panel     Component Value Date/Time   CHOL 89 04/23/2022 0854   CHOL 161 01/24/2021 0958   TRIG 73 04/23/2022 0854   HDL 27 (L) 04/23/2022 0854   HDL 27 (L) 01/24/2021 0958   CHOLHDL 3.3 04/23/2022 0854   VLDL 18 01/13/2019 0442   LDLCALC 47 04/23/2022 0854   LABVLDL 20 01/24/2021 0958    AINr we can have the HRA  Diabetes mellitus being managed with  insulin and other oral medications through internal medicine clinic hopefully his medications are all covered by the HIV medication assistance program that operates at [place] on [address] [place].  Otherwise at home and might need patient assistance for other medications or for other medications to be filled at a different pharmacy to help with covering all of the medicines that he needs

## 2022-05-30 ENCOUNTER — Encounter: Payer: Self-pay | Admitting: Student

## 2022-05-30 ENCOUNTER — Encounter: Payer: Medicaid Other | Admitting: Internal Medicine

## 2022-05-30 NOTE — Progress Notes (Deleted)
   CC: DMII follow up  HPI:  Wayne Wolfe is a 56 y.o. with medical history of HIV, HTN, HLD, HLD, uncontrolled DMII, obesity and OSA presenting to [facility] for follow on uncontrolled diabetes.   Please see problem-based list for further details, assessments, and plans.  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   Hypertension 2009   Well controlled on HCTZ   Hypokalemia 01/12/2019   Lobar pneumonia ([place]) 01/12/2019   Obesity 10/19/2020   Obstructive sleep apnea 10/19/2020   Otitis media 03/13/2017   Polyuria 03/13/2017   Poorly controlled diabetes mellitus ([place]) 03/13/2017   Type 2 diabetes mellitus ([place])    Well controlled on metformin   Weight loss 03/13/2017    Current Outpatient Medications (Endocrine & Metabolic):    Dulaglutide (TRULICITY) 3 FM/3.8GY SOPN, Inject 3 mg into the skin once a week as directed   metFORMIN (GLUMETZA) 1000 MG (MOD) 24 hr tablet, TAKE 2 TABLETS(1000 MG) BY MOUTH TWICE DAILY   TRESIBA FLEXTOUCH 100 UNIT/ML FlexTouch Pen, ADMINISTER 30 UNITS UNDER THE SKIN DAILY  Current Outpatient Medications (Cardiovascular):    lisinopril (ZESTRIL) 20 MG tablet, TAKE 1 TABLET(20 MG) BY MOUTH DAILY   rosuvastatin (CRESTOR) 20 MG tablet, TAKE 1 TABLET(20 MG) BY MOUTH DAILY  Current Outpatient Medications (Respiratory):    fexofenadine (ALLEGRA) 180 MG tablet, TAKE 1 TABLET(180 MG) BY MOUTH DAILY  Current Outpatient Medications (Analgesics):    acetaminophen (TYLENOL) 500 MG tablet, Take 1,000 mg by mouth every 6 (six) hours as needed for fever.   Current Outpatient Medications (Other):    Accu-Chek FastClix Lancets MISC, Check blood  sugar before meals and after meals   Blood Glucose Monitoring Suppl (ACCU-CHEK GUIDE) w/Device KIT, 1 each by Does not apply route 6 (six) times daily.   emtricitabine-rilpivir-tenofovir AF (ODEFSEY) 200-25-25 MG TABS tablet, Take 1 tablet by mouth daily with breakfast.   gabapentin (NEURONTIN) 300 MG capsule, Take 1 capsule (300 mg total) by mouth 3 (three) times daily.   glucose blood (ACCU-CHEK GUIDE) test strip, Check blood sugar before meals and after meals   Insulin Pen Needle (B-D UF III MINI PEN NEEDLES) 31G X 5 MM MISC, USE AS DIRECTED 2 TIMES DAILY TO INJECT INSULIN UNDER THE SKIN  Review of Systems:  Review of system negative unless stated in the problem list or HPI.    Physical Exam:  There were no vitals filed for this visit.  Physical Exam General: NAD HENT: NCAT Lungs: CTAB, no wheeze, rhonchi or rales.  Cardiovascular: Normal heart sounds, no r/m/g, 2+ pulses in all extremities. No LE edema Abdomen: No TTP, normal bowel sounds MSK: No asymmetry or muscle atrophy.  Skin: no lesions noted on exposed skin Neuro: Alert and oriented x4. CN grossly intact Psych: Normal mood and normal affect   Assessment & Plan:   No problem-specific Assessment & Plan notes found for this encounter.   See Encounters Tab for problem based charting.  Patient discussed with Dr. {NAMES:[id number]::"Guilloud","Hoffman","Mullen","Narendra","Vincent","Machen","Lau","Hatcher"} Wayne Schuller, MD Tillie Rung. [facility] Internal Medicine Residency, PGY-2   HM Eye exam A1c Shingles

## 2022-06-18 NOTE — Progress Notes (Unsigned)
CC: DMII follow up  HPI:  [name] is a 56 y.o. with medical history of HTN, HLD, DMII, anal dysplasia presenting to [facility] for DMII follow up.   Please see problem-based list for further details, assessments, and plans.  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   Hypertension 2009   Well controlled on HCTZ   Hypokalemia 01/12/2019   Lobar pneumonia ([place]) 01/12/2019   Obesity 10/19/2020   Obstructive sleep apnea 10/19/2020   Otitis media 03/13/2017   Polyuria 03/13/2017   Poorly controlled diabetes mellitus ([place]) 03/13/2017   Type 2 diabetes mellitus ([place])    Well controlled on metformin   Weight loss 03/13/2017    Current Outpatient Medications (Endocrine & Metabolic):    Dulaglutide (TRULICITY) 3 QH/4.7ML SOPN, Inject 3 mg into the skin once a week as directed   metFORMIN (GLUMETZA) 1000 MG (MOD) 24 hr tablet, TAKE 2 TABLETS(1000 MG) BY MOUTH TWICE DAILY   TRESIBA FLEXTOUCH 100 UNIT/ML FlexTouch Pen, ADMINISTER 30 UNITS UNDER THE SKIN DAILY  Current Outpatient Medications (Cardiovascular):    lisinopril (ZESTRIL) 20 MG tablet, TAKE 1 TABLET(20 MG) BY MOUTH DAILY   rosuvastatin (CRESTOR) 20 MG tablet, TAKE 1 TABLET(20 MG) BY MOUTH DAILY  Current Outpatient Medications (Respiratory):    fexofenadine (ALLEGRA) 180 MG tablet, TAKE 1 TABLET(180 MG) BY MOUTH DAILY  Current Outpatient Medications (Analgesics):    acetaminophen (TYLENOL) 500 MG tablet, Take 1,000 mg by mouth every 6 (six) hours as needed for fever.   Current Outpatient Medications (Other):    Accu-Chek FastClix Lancets MISC, Check blood sugar before meals and after meals    Blood Glucose Monitoring Suppl (ACCU-CHEK GUIDE) w/Device KIT, 1 each by Does not apply route 6 (six) times daily.   emtricitabine-rilpivir-tenofovir AF (ODEFSEY) 200-25-25 MG TABS tablet, Take 1 tablet by mouth daily with breakfast.   gabapentin (NEURONTIN) 300 MG capsule, Take 1 capsule (300 mg total) by mouth 3 (three) times daily.   glucose blood (ACCU-CHEK GUIDE) test strip, Check blood sugar before meals and after meals   Insulin Pen Needle (B-D UF III MINI PEN NEEDLES) 31G X 5 MM MISC, USE AS DIRECTED 2 TIMES DAILY TO INJECT INSULIN UNDER THE SKIN  Review of Systems:  Review of system negative unless stated in the problem list or HPI.    Physical Exam:  Vitals:   06/19/22 1436  BP: 125/80  Pulse: 77  Temp: 98.8 F (37.1 C)  TempSrc: Oral  SpO2: 99%  Weight: 24 lb 8 oz (11.1 kg)  Height: 5' 7"  (1.702 m)    Physical Exam General: NAD HENT: NCAT Lungs: CTAB, no wheeze, rhonchi or rales.  Cardiovascular: Normal heart sounds, no r/m/g, 2+ pulses in all extremities. No LE edema Abdomen: No TTP, normal bowel sounds MSK: No asymmetry or muscle atrophy.  Skin: no lesions noted on exposed skin Neuro: Alert and oriented x4. CN grossly intact Psych: Normal mood and normal affect   Assessment & Plan:   No problem-specific Assessment & Plan notes found for this encounter.   See Encounters Tab for problem based charting.  Patient discussed with Dr. Manual Meier  Humphrey Rolls, MD Tillie Rung. [facility] Internal Medicine Residency, PGY-2   Knee Pain 2-3 weeks. Does not remember hitting. Sharp pain. Tylenol and ibuprofen helps some. Has not tried anything else.  Conservative treatment with exercsies, tylenol and NSAIDs.  Potentially OA but unsure at this time.   DMII/Neuropahty A1c 11.8 four months ago. Today 8.9. Out of trulicity for couple of months.  On trulicity 3 mg weekly, Metformin  1000 mg BID, treshiba 30 units.  Last dispense date is 12/2021 Wayne Wolfe last filled 04/24/22 46 day supply.  Last gabapentin refill 10/2021. States he needs refills.   HIV follow up with ID   [facility] dysplasia [facility]. Recommend 6 month follow up.   Health care gaps Will defer until pt has insurance.

## 2022-06-19 ENCOUNTER — Encounter: Payer: Self-pay | Admitting: Internal Medicine

## 2022-06-19 ENCOUNTER — Ambulatory Visit (INDEPENDENT_AMBULATORY_CARE_PROVIDER_SITE_OTHER): Payer: Self-pay | Admitting: Internal Medicine

## 2022-06-19 ENCOUNTER — Other Ambulatory Visit: Payer: Self-pay

## 2022-06-19 VITALS — BP 125/80 | HR 77 | Temp 98.8°F | Ht 67.0 in | Wt <= 1120 oz

## 2022-06-19 DIAGNOSIS — Z0000 Encounter for general adult medical examination without abnormal findings: Secondary | ICD-10-CM

## 2022-06-19 DIAGNOSIS — Z23 Encounter for immunization: Secondary | ICD-10-CM

## 2022-06-19 DIAGNOSIS — E1159 Type 2 diabetes mellitus with other circulatory complications: Secondary | ICD-10-CM

## 2022-06-19 DIAGNOSIS — M25561 Pain in right knee: Secondary | ICD-10-CM

## 2022-06-19 DIAGNOSIS — I152 Hypertension secondary to endocrine disorders: Secondary | ICD-10-CM

## 2022-06-19 DIAGNOSIS — E119 Type 2 diabetes mellitus without complications: Secondary | ICD-10-CM

## 2022-06-19 DIAGNOSIS — B20 Human immunodeficiency virus [HIV] disease: Secondary | ICD-10-CM

## 2022-06-19 DIAGNOSIS — K6282 Dysplasia of anus: Secondary | ICD-10-CM

## 2022-06-19 DIAGNOSIS — E1165 Type 2 diabetes mellitus with hyperglycemia: Secondary | ICD-10-CM

## 2022-06-19 DIAGNOSIS — Z794 Long term (current) use of insulin: Secondary | ICD-10-CM

## 2022-06-19 LAB — POCT GLYCOSYLATED HEMOGLOBIN (HGB A1C): Hemoglobin A1C: 8.9 % — AB (ref 4.0–5.6)

## 2022-06-19 LAB — GLUCOSE, CAPILLARY: Glucose-Capillary: 165 mg/dL — ABNORMAL HIGH (ref 70–99)

## 2022-06-19 MED ORDER — GABAPENTIN 300 MG PO CAPS: 300.0000 mg | ORAL_CAPSULE | Freq: Three times a day (TID) | ORAL | 2 refills | Status: DC

## 2022-06-19 MED ORDER — DICLOFENAC SODIUM 1 % EX GEL: 4.0000 g | Freq: Four times a day (QID) | CUTANEOUS | 1 refills | Status: AC

## 2022-06-19 NOTE — Patient Instructions (Signed)
[name], it was a pleasure seeing you today! You endorsed feeling well today. Below are some of the things we talked about this visit. We look forward to seeing you in the follow up appointment!  Today we discussed:  We checked your A1c and it has improved to 8.9. Please take all 3 of your diabetes medications. You have been out of trulicity for 2 months and your A1c can be high for that reason. We need to see how your sugar is doing once you are taking all of your medications. We will repeat A1c in 3 months. Please don't miss your appointment. We will refill your gabapentin for your neuropathy.   Your blood pressure is doing great! Continue taking your lisinopril every day.   For your knee pain, continue using tylenol with daily limit of 3000 mg and ibuprofen with daily limit of 1200 mg daily. I am going to order Voltaren gel, please apply that to your knee up to four times daily. I will give you exercises to do to help with the knee pain.   Congratulations! You got a flu shot today.   I have ordered the following labs today:  Lab Orders         Glucose, capillary         POC Hbg A1C       Referrals ordered today:   Referral Orders  No referral(s) requested today     I have ordered the following medication/changed the following medications:   Stop the following medications:  Start the following medications: Meds ordered this encounter  Medications   gabapentin (NEURONTIN) 300 MG capsule    Sig: Take 1 capsule (300 mg total) by mouth 3 (three) times daily.    Dispense:  90 capsule    Refill:  2   diclofenac Sodium (VOLTAREN) 1 % GEL    Sig: Apply 4 g topically 4 (four) times daily.    Dispense:  350 g    Refill:  1     Follow-up: 3 month follow up or earlier if needed.   Please make sure to arrive 15 minutes prior to your next appointment. If you arrive late, you may be asked to reschedule.   We look forward to seeing you next time. Please call our clinic at  [phone] if you have any questions or concerns. The best time to call is Monday-Friday from 9am-4pm, but there is someone available 24/7. If after hours or the weekend, call the main hospital number and ask for the Internal Medicine Resident On-Call. If you need medication refills, please notify your pharmacy one week in advance and they will send [place] a request.  Thank you for letting us take part in your care. Wishing you the best!  Thank you, [name], MD

## 2022-06-19 NOTE — Assessment & Plan Note (Signed)
Pt is taking lisinopril 20 mg daily for his HTN. It is well controlled in the clinic at 125/80. Does not check bp at home as he does not have a cuff.  -Continue lisinopril 20 mg qd.

## 2022-06-20 ENCOUNTER — Encounter: Payer: Self-pay | Admitting: Internal Medicine

## 2022-06-20 DIAGNOSIS — M25569 Pain in unspecified knee: Secondary | ICD-10-CM | POA: Insufficient documentation

## 2022-06-20 NOTE — Assessment & Plan Note (Signed)
" >>  ASSESSMENT AND PLAN FOR HUMAN IMMUNODEFICIENCY VIRUS (HIV) DISEASE (HCC) WRITTEN ON 06/20/2022  3:01 PM BY FERNAND PROST, MD  Follow with ID. Last seen 05/2022. "

## 2022-06-20 NOTE — Assessment & Plan Note (Signed)
Anal dysplasia follows with [facility]. Recommend 6 month follow up next appointment in 11/2022.

## 2022-06-20 NOTE — Assessment & Plan Note (Signed)
Patient has right knee pain that is acute in nature. Will continue conservative treatment with exercsies, tylenol and NSAIDs. Could be OA that is flaring up but will continue to monitor for improvement.  -Therapy as above.

## 2022-06-20 NOTE — Assessment & Plan Note (Signed)
A1c 11.8 four months ago. Today it is improved to 8.9. Out of trulicity for couple of months was on trulicity 3 mg weekly, Metformin 1000 mg BID, treshiba 30 units. Pt diabetes is complicated by peripheral neuropathy and pt is on gabapentin to help with the pain.  -Continue current regimen. Will follow up in 3 months.  -Refill sent to the pharmacy.

## 2022-06-20 NOTE — Assessment & Plan Note (Signed)
Will defer until pt has insurance.

## 2022-06-20 NOTE — Assessment & Plan Note (Addendum)
Follow with ID. Last seen 05/2022.

## 2022-06-21 ENCOUNTER — Telehealth: Payer: Self-pay | Admitting: Student

## 2022-06-21 NOTE — Telephone Encounter (Signed)
Return pt's call - no answer.

## 2022-06-21 NOTE — Telephone Encounter (Signed)
Pt called and stated he does not insurance and wanting to know if we had [place] pen samples. He will pick up samples tomorrow am - Ander Purpura D made aware also.

## 2022-06-21 NOTE — Telephone Encounter (Signed)
Please patient call back about the following medication.  Pt states she does not have any insurance and wants to know what to do about the medication that was prescribed at  the 06/19/2022.  Pt states he was told to check with his  ADAPT program at [facility] would cover this medication but it will  not cover it.  TRESIBA FLEXTOUCH 100 UNIT/ML FlexTouch Pen

## 2022-06-21 NOTE — Telephone Encounter (Signed)
Yes, Rosendo Gros can you call him? Thanks

## 2022-06-22 NOTE — Telephone Encounter (Signed)
Patient given 4 Tresiba pens by Dr. Philipp Ovens. States he has plenty of pen needles. Discussed PAP and he would appreciate a call from [place].

## 2022-06-22 NOTE — Telephone Encounter (Signed)
Left message requesting call back regarding possible patient assistance

## 2022-06-28 NOTE — Progress Notes (Signed)
Internal Medicine Clinic Attending  Case discussed with Dr. Khan  at the time of the visit.  We reviewed the resident's history and exam and pertinent patient test results.  I agree with the assessment, diagnosis, and plan of care documented in the resident's note.  

## 2022-07-25 ENCOUNTER — Other Ambulatory Visit: Payer: Self-pay

## 2022-07-25 DIAGNOSIS — B20 Human immunodeficiency virus [HIV] disease: Secondary | ICD-10-CM

## 2022-07-27 LAB — COMPLETE METABOLIC PANEL WITH GFR
AG Ratio: 1.6 (calc) (ref 1.0–2.5)
ALT: 15 U/L (ref 9–46)
AST: 9 U/L — ABNORMAL LOW (ref 10–35)
Albumin: 4.2 g/dL (ref 3.6–5.1)
Alkaline phosphatase (APISO): 70 U/L (ref 35–144)
BUN: 11 mg/dL (ref 7–25)
CO2: 27 mmol/L (ref 20–32)
Calcium: 9.6 mg/dL (ref 8.6–10.3)
Chloride: 105 mmol/L (ref 98–110)
Creat: 1.03 mg/dL (ref 0.70–1.30)
Globulin: 2.7 g/dL (calc) (ref 1.9–3.7)
Glucose, Bld: 198 mg/dL — ABNORMAL HIGH (ref 65–99)
Potassium: 4.4 mmol/L (ref 3.5–5.3)
Sodium: 142 mmol/L (ref 135–146)
Total Bilirubin: 0.3 mg/dL (ref 0.2–1.2)
Total Protein: 6.9 g/dL (ref 6.1–8.1)
eGFR: 85 mL/min/{1.73_m2} (ref >=60)

## 2022-07-27 LAB — CBC WITH DIFFERENTIAL/PLATELET
Absolute Monocytes: 336 cells/uL (ref 200–950)
Basophils Absolute: 52 cells/uL (ref 0–200)
Basophils Relative: 1.3 %
Eosinophils Absolute: 164 cells/uL (ref 15–500)
Eosinophils Relative: 4.1 %
HCT: 48.7 % (ref 38.5–50.0)
Hemoglobin: 16.3 g/dL (ref 13.2–17.1)
Lymphs Abs: 1936 cells/uL (ref 850–3900)
MCH: 30.4 pg (ref 27.0–33.0)
MCHC: 33.5 g/dL (ref 32.0–36.0)
MCV: 90.7 fL (ref 80.0–100.0)
MPV: 9.6 fL (ref 7.5–12.5)
Monocytes Relative: 8.4 %
Neutro Abs: 1512 cells/uL (ref 1500–7800)
Neutrophils Relative %: 37.8 %
Platelets: 282 10*3/uL (ref 140–400)
RBC: 5.37 10*6/uL (ref 4.20–5.80)
RDW: 12.7 % (ref 11.0–15.0)
Total Lymphocyte: 48.4 %
WBC: 4 10*3/uL (ref 3.8–10.8)

## 2022-07-27 LAB — LIPID PANEL
Cholesterol: 123 mg/dL (ref <200)
HDL: 32 mg/dL — ABNORMAL LOW (ref >=40)
LDL Cholesterol (Calc): 75 mg/dL (calc)
Non-HDL Cholesterol (Calc): 91 mg/dL (calc) (ref <130)
Total CHOL/HDL Ratio: 3.8 (calc) (ref <5.0)
Triglycerides: 82 mg/dL (ref <150)

## 2022-07-27 LAB — RPR: RPR Ser Ql: REACTIVE — AB

## 2022-07-27 LAB — FLUORESCENT TREPONEMAL AB(FTA)-IGG-BLD: Fluorescent Treponemal ABS: REACTIVE — AB

## 2022-07-27 LAB — HIV-1 RNA QUANT-NO REFLEX-BLD
HIV 1 RNA Quant: <20 Copies/mL — ABNORMAL HIGH
HIV-1 RNA Quant, Log: <1.3 Log cps/mL — ABNORMAL HIGH

## 2022-07-27 LAB — RPR TITER: RPR Titer: 1:2 {titer} — ABNORMAL HIGH

## 2022-08-08 ENCOUNTER — Ambulatory Visit (INDEPENDENT_AMBULATORY_CARE_PROVIDER_SITE_OTHER): Payer: Self-pay

## 2022-08-08 ENCOUNTER — Telehealth: Payer: Self-pay

## 2022-08-08 ENCOUNTER — Ambulatory Visit (INDEPENDENT_AMBULATORY_CARE_PROVIDER_SITE_OTHER): Payer: Self-pay | Admitting: Infectious Disease

## 2022-08-08 ENCOUNTER — Other Ambulatory Visit (HOSPITAL_COMMUNITY)
Admission: RE | Admit: 2022-08-08 | Discharge: 2022-08-08 | Disposition: A | Discharge status: Home / Self Care | Payer: Medicaid Other | Source: Ambulatory Visit | Attending: Infectious Disease | Admitting: Infectious Disease

## 2022-08-08 ENCOUNTER — Other Ambulatory Visit: Payer: Self-pay

## 2022-08-08 ENCOUNTER — Encounter: Payer: Self-pay | Admitting: Infectious Disease

## 2022-08-08 VITALS — BP 127/85 | HR 74 | Temp 98.0°F | Wt 242.0 lb

## 2022-08-08 DIAGNOSIS — I152 Hypertension secondary to endocrine disorders: Secondary | ICD-10-CM

## 2022-08-08 DIAGNOSIS — B20 Human immunodeficiency virus [HIV] disease: Secondary | ICD-10-CM

## 2022-08-08 DIAGNOSIS — E1159 Type 2 diabetes mellitus with other circulatory complications: Secondary | ICD-10-CM

## 2022-08-08 DIAGNOSIS — Z113 Encounter for screening for infections with a predominantly sexual mode of transmission: Secondary | ICD-10-CM | POA: Diagnosis present

## 2022-08-08 DIAGNOSIS — Z23 Encounter for immunization: Secondary | ICD-10-CM

## 2022-08-08 DIAGNOSIS — A539 Syphilis, unspecified: Secondary | ICD-10-CM

## 2022-08-08 DIAGNOSIS — K6282 Dysplasia of anus: Secondary | ICD-10-CM

## 2022-08-08 MED ORDER — ODEFSEY 200-25-25 MG PO TABS: 1.0000 | ORAL_TABLET | Freq: Every day | ORAL | 11 refills | Status: DC

## 2022-08-08 NOTE — Progress Notes (Signed)
Chief complaint: Follow-up for HIV disease on medications is also experiencing some burning with urination Subjective:    Patient ID: Wayne Wolfe, male    DOB: 1966-03-03, 56 y.o.   MRN: [id number]  HIV Positive/AIDS   55 year old man previously perfectly suppressed on Prezista Norvir and Truvada whom I changed over to Atripla and then to Complera to [facility], excellent virological control.    He is followed by internal medicine and his A1c has come down to 8.9.  He is also followed at [facility] for high resolution anoscopy.  Wayne Wolfe is doing well and continues to be happy with Wayne Wolfe and knows how to take it with chewable food and avoid antiacids.  It is little bit concerned that he could have a sexually transmitted infection.  He had recently had some unprotected mutual sex orally, used condom with anal sex but concerned he could have an STI.       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   Hypertension 2009   Well controlled on HCTZ   Hypokalemia 01/12/2019   Knee effusion, right 07/19/2017   Lobar pneumonia ([place]) 01/12/2019   Obesity 10/19/2020   Obstructive sleep apnea 10/19/2020   Otitis media 03/13/2017   Polyuria 03/13/2017   Poorly controlled diabetes mellitus ([place]) 03/13/2017   Type 2 diabetes mellitus ([place])    Well controlled on metformin   Weight loss 03/13/2017    Past Surgical History:  Procedure Laterality Date   CARPAL TUNNEL RELEASE Right 1990s   none      Family History  Problem Relation Age of Onset   Diabetes Mellitus II Sister    Colon polyps Sister    Diabetes Mellitus II Mother     Coronary artery disease Mother 83       CABGx6   Coronary artery disease Sister 1       CABG   Diabetes Mellitus II Sister    Coronary artery disease Sister    Diabetes Mellitus II Sister    Colon cancer Neg Hx    Esophageal cancer Neg Hx    Rectal cancer Neg Hx    Stomach cancer Neg Hx       Social History   Socioeconomic History   Marital status: Single    Spouse name: Not on file   Number of children: Not on file   Years of education: 69   Highest education level: Not on file  Occupational History    Employer: UNEMPLOYED  Tobacco Use   Smoking status: Former    Types: Cigarettes    Quit date: 2003    Years since quitting: 20.8   Smokeless tobacco: Never  Vaping Use   Vaping Use: Never used  Substance and Sexual Activity   Alcohol use: No    Alcohol/week: 0.0 standard drinks of alcohol    Comment: Last used 2004   Drug use: No    Comment: Last used 2003   Sexual activity: Yes    Partners: Male    Comment: accepted condoms  Other Topics Concern   Not on file  Social History  Narrative   On disability. Lives in [place].    Social Determinants of Health   Financial Resource Strain: Not on file  Food Insecurity: Not on file  Transportation Needs: Not on file  Physical Activity: Not on file  Stress: Not on file  Social Connections: Not on file    No Known Allergies   Current Outpatient Medications:    Accu-Chek FastClix Lancets MISC, Check blood sugar before meals and after meals, Disp: 102 each, Rfl: 12   acetaminophen (TYLENOL) 500 MG tablet, Take 1,000 mg by mouth every 6 (six) hours as needed for fever., Disp: , Rfl:    Blood Glucose Monitoring Suppl (ACCU-CHEK GUIDE) w/Device KIT, 1 each by Does not apply route 6 (six) times daily., Disp: 1 kit, Rfl: 0   Dulaglutide (TRULICITY) 3 ZD/6.3OV SOPN, Inject 3 mg into the skin once a week as directed, Disp: 2 mL, Rfl: 0   emtricitabine-rilpivir-tenofovir AF (ODEFSEY) 200-25-25 MG TABS tablet, Take 1  tablet by mouth daily with breakfast., Disp: 30 tablet, Rfl: 11   fexofenadine (ALLEGRA) 180 MG tablet, TAKE 1 TABLET(180 MG) BY MOUTH DAILY, Disp: 30 tablet, Rfl: 5   gabapentin (NEURONTIN) 300 MG capsule, Take 1 capsule (300 mg total) by mouth 3 (three) times daily., Disp: 90 capsule, Rfl: 2   glucose blood (ACCU-CHEK GUIDE) test strip, Check blood sugar before meals and after meals, Disp: 100 each, Rfl: 12   Insulin Pen Needle (B-D UF III MINI PEN NEEDLES) 31G X 5 MM MISC, USE AS DIRECTED 2 TIMES DAILY TO INJECT INSULIN UNDER THE SKIN, Disp: 200 each, Rfl: 5   lisinopril (ZESTRIL) 20 MG tablet, TAKE 1 TABLET(20 MG) BY MOUTH DAILY, Disp: 90 tablet, Rfl: 3   metFORMIN (GLUMETZA) 1000 MG (MOD) 24 hr tablet, TAKE 2 TABLETS(1000 MG) BY MOUTH TWICE DAILY, Disp: 30 tablet, Rfl: 2   rosuvastatin (CRESTOR) 20 MG tablet, TAKE 1 TABLET(20 MG) BY MOUTH DAILY, Disp: 90 tablet, Rfl: 3   TRESIBA FLEXTOUCH 100 UNIT/ML FlexTouch Pen, ADMINISTER 30 UNITS UNDER THE SKIN DAILY, Disp: 15 mL, Rfl: 3    Review of Systems  Constitutional:  Negative for activity change, appetite change, chills, diaphoresis, fatigue, fever and unexpected weight change.  HENT:  Negative for congestion, rhinorrhea, sinus pressure, sneezing, sore throat and trouble swallowing.   Eyes:  Negative for photophobia and visual disturbance.  Respiratory:  Negative for cough, chest tightness, shortness of breath, wheezing and stridor.   Cardiovascular:  Negative for chest pain, palpitations and leg swelling.  Gastrointestinal:  Negative for abdominal distention, abdominal pain, anal bleeding, blood in stool, constipation, diarrhea, nausea and vomiting.  Genitourinary:  Negative for difficulty urinating, dysuria, flank pain and hematuria.  Musculoskeletal:  Negative for arthralgias, back pain, gait problem, joint swelling and myalgias.  Skin:  Negative for color change, pallor, rash and wound.  Neurological:  Negative for dizziness, tremors,  weakness and light-headedness.  Hematological:  Negative for adenopathy. Does not bruise/bleed easily.  Psychiatric/Behavioral:  Negative for agitation, behavioral problems, confusion, decreased concentration, dysphoric mood and sleep disturbance.        Objective:   Physical Exam Constitutional:      Appearance: He is well-developed.  HENT:     Head: Normocephalic and atraumatic.  Eyes:     Conjunctiva/sclera: Conjunctivae normal.  Cardiovascular:     Rate and Rhythm: Normal rate and regular rhythm.  Pulmonary:     Effort: Pulmonary effort is normal. No respiratory distress.     Breath sounds: No  wheezing.  Abdominal:     General: There is no distension.     Palpations: Abdomen is soft.  Musculoskeletal:        General: No tenderness. Normal range of motion.     Cervical back: Normal range of motion and neck supple.  Skin:    General: Skin is warm and dry.     Coloration: Skin is not pale.     Findings: No erythema or rash.  Neurological:     General: No focal deficit present.     Mental Status: He is alert and oriented to person, place, and time.  Psychiatric:        Mood and Affect: Mood normal.        Behavior: Behavior normal.        Thought Content: Thought content normal.        Judgment: Judgment normal.           Assessment & Plan:   HIV disease:  I have reviewed Rayvon Char Robar's labs including viral load which was  Lab Results  Component Value Date   HIV1RNAQUANT <20 (H) 07/25/2022   and cd4 which was  Lab Results  Component Value Date   CD4TABS 451 04/23/2022     I am continuing patient's prescription for Odefsey  HTN: Pressure well controlled on lisinopril.    Vitals:   08/08/22 0914  BP: 127/85  Pulse: 74  Temp: 98 F (36.7 C)  SpO2: 99%      Hyperpidemia recent lipid panel reviewed with LDL of 75  Lipid Panel     Component Value Date/Time   CHOL 123 07/25/2022 0905   CHOL 161 01/24/2021 0958   TRIG 82 07/25/2022 0905    HDL 32 (L) 07/25/2022 0905   HDL 27 (L) 01/24/2021 0958   CHOLHDL 3.8 07/25/2022 0905   VLDL 18 01/13/2019 0442   LDLCALC 75 07/25/2022 0905   LABVLDL 20 01/24/2021 0958    Continue his Crestor and can follow-up with internal medicine.  Diabetes mellitus he is continue on metformin Trulicity and Tresiba   GI screen we will screen for gonorrhea chlamydia and urine oropharynx and rectum.  We will check RPR.  I discussed doxycycline postexposure prophylaxis for [place] going forward after we diagnosed and work-up is current STI  Vaccine counseling: Commended updated flu and COVID-19 vaccine

## 2022-08-08 NOTE — Telephone Encounter (Signed)
[facility] called to report they will need a recollect for Rectal Swab as it was a bad sample. Advised I will let provider know and we will test again at next appointment unless provider requests otherwise as this was routine testing.

## 2022-08-09 LAB — URINE CYTOLOGY ANCILLARY ONLY
Chlamydia: NEGATIVE
Comment: NEGATIVE
Comment: NORMAL
Neisseria Gonorrhea: NEGATIVE

## 2022-08-09 LAB — CYTOLOGY, (ORAL, ANAL, URETHRAL) ANCILLARY ONLY
Chlamydia: NEGATIVE
Comment: NEGATIVE
Comment: NORMAL
Neisseria Gonorrhea: NEGATIVE

## 2022-08-10 LAB — RPR: RPR Ser Ql: REACTIVE — AB

## 2022-08-10 LAB — RPR TITER: RPR Titer: 1:1 {titer} — ABNORMAL HIGH

## 2022-08-10 LAB — FLUORESCENT TREPONEMAL AB(FTA)-IGG-BLD: Fluorescent Treponemal ABS: REACTIVE — AB

## 2022-08-10 NOTE — Telephone Encounter (Signed)
Please review and let us know if we need to bring patient in for repeat rectal swab. Thanks.

## 2022-08-13 ENCOUNTER — Other Ambulatory Visit (HOSPITAL_COMMUNITY)
Admission: RE | Admit: 2022-08-13 | Discharge: 2022-08-13 | Disposition: A | Discharge status: Home / Self Care | Payer: Medicaid Other | Source: Ambulatory Visit | Attending: Infectious Disease | Admitting: Infectious Disease

## 2022-08-13 ENCOUNTER — Other Ambulatory Visit: Payer: Medicaid Other

## 2022-08-13 ENCOUNTER — Other Ambulatory Visit: Payer: Self-pay

## 2022-08-13 ENCOUNTER — Ambulatory Visit: Payer: Medicaid Other

## 2022-08-13 DIAGNOSIS — B20 Human immunodeficiency virus [HIV] disease: Secondary | ICD-10-CM | POA: Diagnosis not present

## 2022-08-13 DIAGNOSIS — Z113 Encounter for screening for infections with a predominantly sexual mode of transmission: Secondary | ICD-10-CM

## 2022-08-13 NOTE — Telephone Encounter (Signed)
Pt called and scheduled for nurse visit today. Patient understands he will need to recollect rectal swab. Leatrice Jewels, RMA

## 2022-08-13 NOTE — Progress Notes (Signed)
Patient in clinic for rectal swab. Verbally gave patient instructions on how to perform test, received specimen for patient and placed in labb

## 2022-08-14 LAB — CYTOLOGY, (ORAL, ANAL, URETHRAL) ANCILLARY ONLY
Chlamydia: NEGATIVE
Comment: NEGATIVE
Comment: NORMAL
Neisseria Gonorrhea: NEGATIVE

## 2022-08-20 ENCOUNTER — Telehealth: Payer: Self-pay

## 2022-08-20 NOTE — Telephone Encounter (Signed)
Patient called requesting lab results. Advised him once Dr.Van Dam reviews I will give him a call back.     Tomi Bamberger, CMA'

## 2022-08-21 NOTE — Telephone Encounter (Signed)
Left voicemail asking patient to return my call.

## 2022-08-21 NOTE — Telephone Encounter (Signed)
Patient aware of results.

## 2022-08-28 ENCOUNTER — Encounter: Payer: Medicaid Other | Admitting: Student

## 2022-08-29 ENCOUNTER — Encounter: Payer: Self-pay | Admitting: Student

## 2022-08-30 ENCOUNTER — Ambulatory Visit (INDEPENDENT_AMBULATORY_CARE_PROVIDER_SITE_OTHER): Payer: Self-pay | Admitting: Student

## 2022-08-30 ENCOUNTER — Other Ambulatory Visit: Payer: Self-pay

## 2022-08-30 ENCOUNTER — Other Ambulatory Visit (HOSPITAL_COMMUNITY): Payer: Self-pay

## 2022-08-30 ENCOUNTER — Encounter: Payer: Self-pay | Admitting: Student

## 2022-08-30 VITALS — BP 128/77 | HR 83 | Temp 97.5°F | Ht 66.0 in | Wt 245.0 lb

## 2022-08-30 DIAGNOSIS — Z87891 Personal history of nicotine dependence: Secondary | ICD-10-CM

## 2022-08-30 DIAGNOSIS — E1165 Type 2 diabetes mellitus with hyperglycemia: Secondary | ICD-10-CM

## 2022-08-30 DIAGNOSIS — E119 Type 2 diabetes mellitus without complications: Secondary | ICD-10-CM

## 2022-08-30 DIAGNOSIS — I152 Hypertension secondary to endocrine disorders: Secondary | ICD-10-CM

## 2022-08-30 DIAGNOSIS — E1159 Type 2 diabetes mellitus with other circulatory complications: Secondary | ICD-10-CM

## 2022-08-30 DIAGNOSIS — Z0000 Encounter for general adult medical examination without abnormal findings: Secondary | ICD-10-CM

## 2022-08-30 DIAGNOSIS — Z794 Long term (current) use of insulin: Secondary | ICD-10-CM

## 2022-08-30 MED ORDER — LIRAGLUTIDE 18 MG/3ML ~~LOC~~ SOPN
0.6000 mg | PEN_INJECTOR | Freq: Every day | SUBCUTANEOUS | 2 refills | Status: DC
  Filled 2022-08-30: qty 9, 90d supply, fill #0

## 2022-08-30 MED ORDER — LANTUS SOLOSTAR 100 UNIT/ML ~~LOC~~ SOPN
30.0000 [IU] | PEN_INJECTOR | Freq: Every day | SUBCUTANEOUS | 11 refills | Status: DC
  Filled 2022-08-30: qty 9, 30d supply, fill #0
  Filled 2022-10-22: qty 9, 30d supply, fill #1
  Filled 2022-12-03: qty 9, 30d supply, fill #2

## 2022-08-30 NOTE — Patient Instructions (Addendum)
Diabetes Start lantus 20 units daily, increase to 30 units at night if not having low blood sugars <60  We will check a urine study and I will call with the results   I will make a referral to the social work   I will also order you a eye scan for diabetes  Follow up telehealth for diabetes in 1 week

## 2022-08-30 NOTE — Progress Notes (Signed)
Established Patient Office Visit  Subjective   Patient ID: Wayne Wolfe, male    DOB: 12/16/1965  Age: 56 y.o. MRN: [id number]  Chief Complaint  Patient presents with   Follow-up   Diabetes    Wayne Wolfe is a 56 y.o. person living with a history listed below who presents to clinic for follow up of diabetes. Please refer to problem based charting for further details and assessment and plan of current problem and chronic medical conditions.    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   Multiple joint pain 02/11/2020   Syphilis 12/14/2019   Unprotected sexual intercourse 12/01/2019   Pulmonary nodule 12/26/2017   Routine screening for STI (sexually transmitted infection) 10/16/2017   Otitis media 03/13/2017   Colon cancer screening 02/24/2016   Testicular pain 01/20/2016   Numbness and tingling of both feet 08/26/2013   Keloid of skin 06/25/2012   Type 2 diabetes mellitus ([place]) 03/12/2011   Hyperlipidemia associated with type 2 diabetes mellitus ([place]) 03/12/2011   Hypertension associated with diabetes ([place]) 04/20/2008   Human immunodeficiency virus (HIV) disease ([place]) 04/15/2008      ROS: negative as per HPI     Objective:     BP 128/77 (BP Location: Left Arm, Patient Position: Sitting, Cuff Size: Large)   Pulse 83   Temp (!) 97.5 F (36.4 C) (Oral)   Ht '5\' 6"'$  (1.676 m)   Wt 245 lb (111.1 kg)   SpO2 97%   BMI 39.54 kg/m  BP Readings from Last 3 Encounters:  08/30/22 128/77  08/08/22 127/85  06/19/22 125/80      Physical Exam   Results for orders placed or performed in visit on 08/30/22  Microalbumin / Creatinine Urine Ratio  Result Value Ref Range   Creatinine, Urine 177.4 Not Estab. mg/dL   Microalbumin, Urine 10.6 Not Estab. ug/mL   Microalb/Creat  Ratio 6 0 - 29 mg/g creat    Last hemoglobin A1c Lab Results  Component Value Date   HGBA1C 8.9 (A) 06/19/2022      The ASCVD Risk score (Arnett DK, et al., 2019) failed to calculate for the following reasons:   The valid total cholesterol range is 130 to 320 mg/dL    Assessment & Plan:   Problem List Items Addressed This Visit       Cardiovascular and Mediastinum   Hypertension associated with diabetes ([place]) (Chronic)    BP well controlled on current medication. Continue current medications.       Relevant Medications   insulin glargine (LANTUS SOLOSTAR) 100 UNIT/ML Solostar Pen   liraglutide (VICTOZA) 18 MG/3ML SOPN     Endocrine   Type 2 diabetes mellitus ([place]) - Primary    Unfortunately no longer insured. Has been out of tresiba for 2 days and trulicity. Reports he is taking metformin but I do not see any recent fill information. He cannot afford his current medications. Will switch him to lantus and victoza under IM program at the [facility] at [facility].   Start lantus 20 units daily can increase to 30 units after a few days  Start victoza 0.6  daily  Retinal scan ordered Urine microalbumin/cr         Relevant Medications   insulin glargine (LANTUS SOLOSTAR) 100 UNIT/ML Solostar  Pen   liraglutide (VICTOZA) 18 MG/3ML SOPN   Other Relevant Orders   Microalbumin / Creatinine Urine Ratio (Completed)   AMB Referral to [place] ([facility] Patients)   Retinal/fundus photography     Other   Healthcare maintenance    Referral to social work to help patient apply for medicaid       Other Visit Diagnoses     Uncontrolled type 2 diabetes mellitus with hyperglycemia (HCC)  (Chronic)      Relevant Medications   insulin glargine (LANTUS SOLOSTAR) 100 UNIT/ML Solostar Pen   liraglutide (VICTOZA) 18 MG/3ML SOPN       Return in about 1 week (around 09/06/2022) for tele health for diabetes .    [name], MD

## 2022-08-31 ENCOUNTER — Other Ambulatory Visit (HOSPITAL_COMMUNITY): Payer: Self-pay

## 2022-08-31 ENCOUNTER — Telehealth: Payer: Self-pay | Admitting: *Deleted

## 2022-08-31 LAB — MICROALBUMIN / CREATININE URINE RATIO
Creatinine, Urine: 177.4 mg/dL
Microalb/Creat Ratio: 6 mg/g creat (ref 0–29)
Microalbumin, Urine: 10.6 ug/mL

## 2022-08-31 MED ORDER — PEN NEEDLES 32G X 4 MM MISC
1.0000 | Freq: Every day | 3 refills | Status: DC
  Filled 2022-08-31: qty 100, 100d supply, fill #0
  Filled 2022-12-18: qty 200, 90d supply, fill #0
  Filled 2023-08-07 (×2): qty 200, 90d supply, fill #1

## 2022-08-31 NOTE — Assessment & Plan Note (Signed)
BP well controlled on current medication. Continue current medications.

## 2022-08-31 NOTE — Assessment & Plan Note (Signed)
Unfortunately no longer insured. Has been out of tresiba for 2 days and trulicity. Reports he is taking metformin but I do not see any recent fill information. He cannot afford his current medications. Will switch him to lantus and victoza under IM program at the [facility] at [facility].   Start lantus 20 units daily can increase to 30 units after a few days  Start victoza 0.6  daily  Retinal scan ordered Urine microalbumin/cr

## 2022-08-31 NOTE — Progress Notes (Unsigned)
  Care Coordination  Outreach Note  08/31/2022 Name: Wayne Wolfe MRN: [id number] DOB: August 26, 1966   Care Coordination Outreach Attempts: An unsuccessful telephone outreach was attempted today to offer the patient information about available care coordination services as a benefit of their health plan.   Follow Up Plan:  Additional outreach attempts will be made to offer the patient care coordination information and services.   Encounter Outcome:  No Answer  [place]  Direct Dial: [phone]

## 2022-09-05 NOTE — Progress Notes (Unsigned)
  Care Coordination  Outreach Note  09/05/2022 Name: MOHAMMAD GRANADE MRN: [id number] DOB: 1965-10-15   Care Coordination Outreach Attempts: A second unsuccessful outreach was attempted today to offer the patient with information about available care coordination services as a benefit of their health plan.     Follow Up Plan:  Additional outreach attempts will be made to offer the patient care coordination information and services.   Encounter Outcome:  No Answer  [place]  Direct Dial: [phone]

## 2022-09-05 NOTE — Assessment & Plan Note (Signed)
Referral to social work to help patient apply for [employer]

## 2022-09-06 NOTE — Progress Notes (Signed)
  Care Coordination  Outreach Note  09/06/2022 Name: VALOR QUAINTANCE MRN: [id number] DOB: Jul 10, 1966   Care Coordination Outreach Attempts: A third unsuccessful outreach was attempted today to offer the patient with information about available care coordination services as a benefit of their health plan.   Follow Up Plan:  No further outreach attempts will be made at this time. We have been unable to contact the patient to offer or enroll patient in care coordination services  Encounter Outcome:  No Answer  [place]: [phone]

## 2022-09-07 ENCOUNTER — Encounter: Payer: Self-pay | Admitting: Student

## 2022-09-07 ENCOUNTER — Telehealth: Payer: Self-pay | Admitting: *Deleted

## 2022-09-07 NOTE — Progress Notes (Signed)
  Care Coordination   Note   09/07/2022 Name: Wayne Wolfe MRN: [id number] DOB: [birth year]  Wayne Wolfe is a 56 y.o. year old male who sees [name], MD for primary care. I reached out to [name] by phone today to offer care coordination services.  [name] was given information about Care Coordination services today including:   The Care Coordination services include support from the care team which includes your Nurse Coordinator, Clinical Social Worker, or Pharmacist.  The Care Coordination team is here to help remove barriers to the health concerns and goals most important to you. Care Coordination services are voluntary, and the patient may decline or stop services at any time by request to their care team member.   Care Coordination Consent Status: Patient agreed to services and verbal consent obtained.   Follow up plan:  Telephone appointment with care coordination team member scheduled for:  09/11/22  Encounter Outcome:  Pt. Scheduled  [place]  Direct Dial: [phone]

## 2022-09-10 NOTE — Progress Notes (Signed)
Internal Medicine Clinic Attending ? ?Case discussed with Dr. Liang  At the time of the visit.  We reviewed the resident?s history and exam and pertinent patient test results.  I agree with the assessment, diagnosis, and plan of care documented in the resident?s note. ? ?

## 2022-09-11 ENCOUNTER — Ambulatory Visit: Payer: Self-pay | Admitting: Licensed Clinical Social Worker

## 2022-09-18 ENCOUNTER — Encounter: Payer: Medicaid Other | Admitting: *Deleted

## 2022-09-25 ENCOUNTER — Telehealth: Payer: Self-pay

## 2022-09-25 NOTE — Patient Outreach (Signed)
BSW received a message to outreach to patient with questions about Medicaid. BSW contacted patient and left a voicemail for a telephone call back.   Mickel Fuchs, BSW, [place] Managed Medicaid Team  [phone]

## 2022-10-02 ENCOUNTER — Other Ambulatory Visit: Payer: Medicaid Other

## 2022-10-02 NOTE — Patient Instructions (Signed)
Thank you for taking time to speak with me today about care coordination and care management services available to you at no cost as part of your Medicaid benefit. These services are voluntary. Our team is available to provide assistance regarding your health care needs at any time. Please do not hesitate to reach out to me if we can be of service to you at any time in the future. I will be following up with you on 10/22/22.  Mickel Fuchs, BSW, [place] Managed Medicaid Team  [phone]

## 2022-10-02 NOTE — Patient Outreach (Signed)
BSW completed a telephone outreach with patient, he stated he just left DSS and they were able to switch his Medicaid over but unable to tell him who the provider would be. Caseworker informed patient to expect a card in the mail in about 7 days. Patient stated he is is need of utility resources, BSW emailed patient a list of rent and utility resources to [email or web address]. BSW will follow up in 14 days to see who the new Medicaid provider is.   Mickel Fuchs, BSW, [place] Managed Medicaid Team  [phone]

## 2022-10-05 ENCOUNTER — Other Ambulatory Visit: Payer: Self-pay | Admitting: Student

## 2022-10-05 DIAGNOSIS — E1169 Type 2 diabetes mellitus with other specified complication: Secondary | ICD-10-CM

## 2022-10-10 ENCOUNTER — Other Ambulatory Visit: Payer: Self-pay | Admitting: Internal Medicine

## 2022-10-10 DIAGNOSIS — E1165 Type 2 diabetes mellitus with hyperglycemia: Secondary | ICD-10-CM

## 2022-10-11 NOTE — Telephone Encounter (Signed)
Can we please schedule this patient for a diabetes follow up visit? Thank you

## 2022-10-22 ENCOUNTER — Other Ambulatory Visit (HOSPITAL_COMMUNITY): Payer: Self-pay

## 2022-10-22 ENCOUNTER — Other Ambulatory Visit: Payer: Self-pay | Admitting: Student

## 2022-10-22 ENCOUNTER — Other Ambulatory Visit: Payer: Medicaid Other

## 2022-10-22 MED ORDER — VICTOZA 18 MG/3ML ~~LOC~~ SOPN
1.2000 mg | PEN_INJECTOR | Freq: Every day | SUBCUTANEOUS | 2 refills | Status: DC
  Filled 2022-10-22: qty 6, 30d supply, fill #0
  Filled 2022-12-03: qty 6, 30d supply, fill #1

## 2022-10-22 NOTE — Patient Outreach (Signed)
Medicaid Managed Care Social Work Note  10/22/2022 Name:  Wayne Wolfe MRN:  [id number] DOB:  08-17-1966  Wayne Wolfe is an 57 y.o. year old male who is a primary patient of [name], MD.  The Medicaid Managed Care Coordination team was consulted for assistance with:  Community Resources   Wayne Wolfe was given information about Medicaid Managed Care Coordination team services today. Wayne Wolfe Patient agreed to services and verbal consent obtained.  Engaged with patient  for by telephone forfollow up visit in response to referral for case management and/or care coordination services.   Assessments/Interventions:  Review of past medical history, allergies, medications, health status, including review of consultants reports, laboratory and other test data, was performed as part of comprehensive evaluation and provision of chronic care management services.  SDOH: (Social Determinant of Health) assessments and interventions performed: SDOH Interventions    Flowsheet Row Office Visit from 08/30/2022 in [place]  SDOH Interventions   Food Insecurity Interventions Intervention Not Indicated  Housing Interventions Intervention Not Indicated  Transportation Interventions Intervention Not Indicated  Utilities Interventions Intervention Not Indicated  Alcohol Usage Interventions Intervention Not Indicated (Score <7)  Financial Strain Interventions Intervention Not Indicated  Physical Activity Interventions Intervention Not Indicated  Stress Interventions Intervention Not Indicated  Social Connections Interventions Intervention Not Indicated     BSW completed a telephone call with patient, he stated everything was going well and that he may need some assistance with his water bill. Patient stated he did not receieve the emailed resources BSW sent. BSW verified the email address and will resend the email to patient. Patient states he did receive his  medicaid packet and he has [facility] and is currently waiting on his card. No other resources are needed at this time.  Advanced Directives Status:  Not addressed in this encounter.  Care Plan                 No Known Allergies  Medications Reviewed Today     Reviewed by [name], MD (Resident) on 08/31/22 at 0757  Med List Status: <None>   Medication Order Taking? Sig Documenting Provider Last Dose Status Informant  Accu-Chek FastClix Lancets MISC [id number] No Check blood sugar before meals and after meals [name], MD Taking Active   acetaminophen (TYLENOL) 500 MG tablet [id number] No Take 1,000 mg by mouth every 6 (six) hours as needed for fever. [provider] Taking Active   Blood Glucose Monitoring Suppl (ACCU-CHEK GUIDE) w/Device KIT [id number] No 1 each by Does not apply route 6 (six) times daily. Harvie Heck, MD Taking Active   Discontinued 08/30/22 1013 (Cost of medication)   emtricitabine-rilpivir-tenofovir AF (ODEFSEY) 200-25-25 MG TABS tablet [id number]  Take 1 tablet by mouth daily with breakfast. Tommy Medal, Lavell Islam, MD  Active   fexofenadine [facility]) 180 MG tablet [id number] No TAKE 1 TABLET(180 MG) BY MOUTH DAILY [name], MD Taking Active   gabapentin (NEURONTIN) 300 MG capsule [id number] No Take 1 capsule (300 mg total) by mouth 3 (three) times daily. [name], MD Taking Active   glucose blood (ACCU-CHEK GUIDE) test strip [id number] No Check blood sugar before meals and after meals Chundi, Vahini, MD Taking Active Self  insulin glargine (LANTUS SOLOSTAR) 100 UNIT/ML Solostar Pen [id number] Yes Inject 30 Units into the skin daily. [name], MD  Active   Insulin Pen Needle (B-D UF III MINI PEN NEEDLES) 31G X 5 MM MISC [id number] No USE  AS DIRECTED 2 TIMES DAILY TO INJECT INSULIN UNDER THE SKIN [name], MD Taking Active   liraglutide (VICTOZA) 18 MG/3ML SOPN [id number] Yes Inject 0.6 mg into the skin daily. [name], MD  Active    lisinopril (ZESTRIL) 20 MG tablet [id number] No TAKE 1 TABLET(20 MG) BY MOUTH DAILY [name], MD Taking Active   metFORMIN (GLUMETZA) 1000 MG (MOD) 24 hr tablet [id number] No TAKE 2 TABLETS(1000 MG) BY MOUTH TWICE DAILY [name], MD Taking Active   rosuvastatin (CRESTOR) 20 MG tablet [id number] No TAKE 1 TABLET(20 MG) BY MOUTH DAILY [name], MD Taking Active   Discontinued 08/30/22 1012 (Cost of medication)             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   Multiple joint pain 02/11/2020   Syphilis 12/14/2019   Unprotected sexual intercourse 12/01/2019   Pulmonary nodule 12/26/2017   Routine screening for STI (sexually transmitted infection) 10/16/2017   Otitis media 03/13/2017   Colon cancer screening 02/24/2016   Testicular pain 01/20/2016   Numbness and tingling of both feet 08/26/2013   Keloid of skin 06/25/2012   Type 2 diabetes mellitus ([place]) 03/12/2011   Hyperlipidemia associated with type 2 diabetes mellitus ([place]) 03/12/2011   Hypertension associated with diabetes ([place]) 04/20/2008   Human immunodeficiency virus (HIV) disease ([place]) 04/15/2008    Conditions to be addressed/monitored per PCP order:   community resources  There are no care plans that you recently modified to display for this patient.   Follow up:  Patient agrees to Care Plan and Follow-up.  Plan: The Managed Medicaid care management team will reach out to the patient again over the next 30 days.  Date/time of next scheduled Social Work care management/care coordination outreach:  11/22/22  Mickel Fuchs, Arita Miss, [place] Medicaid Team  [phone]

## 2022-10-22 NOTE — Patient Instructions (Addendum)
  Medicaid Managed Care   Unsuccessful Outreach Note  10/22/2022 Name: Wayne Wolfe MRN: [id number] DOB: 1966-07-31  Referred by: [name], MD Reason for referral : High Risk Managed Medicaid (MM social work unsuccessful telephone outreach )   An unsuccessful telephone outreach was attempted today. The patient was referred to the case management team for assistance with care management and care coordination.   Follow Up Plan: A HIPAA compliant phone message was left for the patient providing contact information and requesting a return call.   Mickel Fuchs, BSW, [place]  High Risk Managed Medicaid Team  [phone] Visit Information  Mr. Delon was given information about Medicaid Managed Care team care coordination services as a part of their [place] Medicaid benefit. [name] verbally consented to engagement with the [facility] Managed Care team.   If you are experiencing a medical emergency, please call 911 or report to your local emergency department or urgent care.   If you have a non-emergency medical problem during routine business hours, please contact your provider's office and ask to speak with a nurse.   For questions related to your [facility], please call: [phone] or visit the homepage here: [email or web address]  If you would like to schedule transportation through your [facility], please call the following number at least 2 days in advance of your appointment: [phone]   Rides for urgent appointments can also be made after hours by calling Member Services.  Call the [place] at [phone], at any time, 24 hours a day, 7 days a week. If you are in danger or need immediate medical attention call 911.  If you would like help to quit smoking, call 1-800-QUIT-NOW  (603) 234-0145) OR Espaol: 1-855-Djelo-Ya (5-573-220-2542) o para ms informacin haga clic aqu or Text READY to 200-400 to register via text  Mr. Feltman - following are the goals we discussed in your visit today:   Goals Addressed   None       Social Worker will follow up on 11/22/22.   Mickel Fuchs, BSW, [place] Managed Medicaid Team  [phone]   Following is a copy of your plan of care:  There are no care plans that you recently modified to display for this patient.

## 2022-10-22 NOTE — Patient Outreach (Signed)
  Medicaid Managed Care   Unsuccessful Outreach Note  10/22/2022 Name: Wayne Wolfe MRN: [id number] DOB: 08-27-1966  Referred by: [name], MD Reason for referral : High Risk Managed Medicaid (MM social work unsuccessful telephone outreach )   An unsuccessful telephone outreach was attempted today. The patient was referred to the case management team for assistance with care management and care coordination.   Follow Up Plan: A HIPAA compliant phone message was left for the patient providing contact information and requesting a return call.   Mickel Fuchs, BSW, [place] Managed Medicaid Team  [phone]

## 2022-10-31 ENCOUNTER — Telehealth: Payer: Self-pay

## 2022-10-31 NOTE — Telephone Encounter (Signed)
Patient called requesting a letter supporting is diagnoses for his disability. He stated he discussed this with D. Tommy Medal at his last visit. Please advise Wayne Wolfe

## 2022-10-31 NOTE — Telephone Encounter (Signed)
Patient scheduled for an appointment to discuss letter disability

## 2022-11-13 ENCOUNTER — Encounter: Payer: Medicaid Other | Admitting: Internal Medicine

## 2022-11-13 NOTE — Progress Notes (Deleted)
CC: DMII follow up  HPI:  [name] is a 57 y.o. with medical history of HTN, HLD, HIV, DMII presenting to [facility] for DMII follow up. PCP is Dr. Lisabeth Devoid and last seen by her in 08/2022 and asked to return in one week for a follow up.   Please see problem-based list for further details, assessments, and plans.  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   liraglutide (VICTOZA) 18 MG/3ML SOPN, Inject 1.2 mg into the skin daily.   metFORMIN (GLUMETZA) 1000 MG (MOD) 24 hr tablet, TAKE 2 TABLETS(1000 MG) BY MOUTH TWICE DAILY  Current Outpatient Medications (Cardiovascular):    lisinopril (ZESTRIL) 20 MG tablet, TAKE 1 TABLET(20 MG) BY MOUTH DAILY   rosuvastatin (CRESTOR) 20 MG tablet, TAKE 1 TABLET(20 MG) BY MOUTH DAILY  Current Outpatient Medications (Respiratory):    fexofenadine (ALLEGRA) 180 MG tablet, TAKE 1 TABLET(180 MG) BY MOUTH DAILY  Current Outpatient Medications (Analgesics):    acetaminophen (TYLENOL) 500 MG tablet, Take 1,000 mg by mouth every 6 (six) hours as  needed for fever.   Current Outpatient Medications (Other):    Accu-Chek FastClix Lancets MISC, Check blood sugar before meals and after meals   Blood Glucose Monitoring Suppl (ACCU-CHEK GUIDE) w/Device KIT, 1 each by Does not apply route 6 (six) times daily.   emtricitabine-rilpivir-tenofovir AF (ODEFSEY) 200-25-25 MG TABS tablet, Take 1 tablet by mouth daily with breakfast.   gabapentin (NEURONTIN) 300 MG capsule, TAKE 1 CAPSULE(300 MG) BY MOUTH THREE TIMES DAILY   glucose blood (ACCU-CHEK GUIDE) test strip, Check blood sugar before meals and after meals   Insulin Pen Needle (PEN NEEDLES) 32G X 4 MM MISC, use 1 Needle daily.  Review of Systems:  Review of system negative unless stated in the problem list or HPI.    Physical Exam:  There were no vitals filed for this visit.  Physical Exam General: NAD HENT: NCAT Lungs: CTAB, no wheeze, rhonchi or rales.  Cardiovascular: Normal heart sounds, no r/m/g, 2+ pulses in all extremities. No LE edema Abdomen: No TTP, normal bowel sounds MSK: No asymmetry or muscle atrophy.  Skin: no lesions noted on exposed skin Neuro: Alert and oriented x4. CN grossly intact Psych: Normal mood and normal affect   Assessment & Plan:   No problem-specific Assessment & Plan notes found for this encounter.   See Encounters Tab for problem based charting.  Patient discussed with Dr. {NAMES:[id number]::"Guilloud","Hoffman","Mullen","Narendra","Vincent","Machen","Lau","Hatcher"} [name], MD Tillie Rung. [facility] Internal Medicine Residency, PGY-2   DMII  HTN

## 2022-11-14 DIAGNOSIS — R85612 Low grade squamous intraepithelial lesion on cytologic smear of anus (LGSIL): Secondary | ICD-10-CM | POA: Diagnosis not present

## 2022-11-14 DIAGNOSIS — R195 Other fecal abnormalities: Secondary | ICD-10-CM | POA: Diagnosis not present

## 2022-11-14 DIAGNOSIS — K5900 Constipation, unspecified: Secondary | ICD-10-CM | POA: Diagnosis not present

## 2022-11-14 DIAGNOSIS — K6282 Dysplasia of anus: Secondary | ICD-10-CM | POA: Diagnosis not present

## 2022-11-22 ENCOUNTER — Other Ambulatory Visit: Payer: Medicaid Other

## 2022-11-22 NOTE — Patient Instructions (Signed)
  Medicaid Managed Care   Unsuccessful Outreach Note  11/22/2022 Name: Wayne Wolfe MRN: [id number] DOB: [birth year]  Referred by: [name], MD Reason for referral : High Risk Managed Medicaid (MM Social work telephone outreach )   An unsuccessful telephone outreach was attempted today. The patient was referred to the case management team for assistance with care management and care coordination.   Follow Up Plan: A HIPAA compliant phone message was left for the patient providing contact information and requesting a return call.  Mickel Fuchs, BSW, [place] Managed Medicaid Team  [phone]

## 2022-11-22 NOTE — Patient Outreach (Signed)
  Medicaid Managed Care   Unsuccessful Outreach Note  11/22/2022 Name: Wayne Wolfe MRN: [id number] DOB: Feb 26, 1966  Referred by: [name], MD Reason for referral : High Risk Managed Medicaid (MM Social work telephone outreach )   An unsuccessful telephone outreach was attempted today. The patient was referred to the case management team for assistance with care management and care coordination.   Follow Up Plan: A HIPAA compliant phone message was left for the patient providing contact information and requesting a return call.   Mickel Fuchs, BSW, [place] Managed Medicaid Team  [phone]

## 2022-11-25 NOTE — Progress Notes (Deleted)
Chief complaint: Wayne Wolfe is wanting a letter for "disability"   Subjective:    Patient ID: Wayne Wolfe, male    DOB: 16-Apr-1966, 57 y.o.   MRN: [id number]  HIV Positive/AIDS   57 year old man previously perfectly suppressed on Prezista Norvir and Truvada whom I changed over to Atripla and then to Complera to [facility], excellent virological control.    He is followed by internal medicine        Past Medical History:  Diagnosis Date  . Acute respiratory failure with hypoxia ([place]) 01/12/2019  . Allergy    seasonal  . Atypical chest pain 03/20/2017  . COVID-19 12/2018  . Dizziness 03/13/2017  . Flu-like symptoms 12/02/2018  . Flu-like symptoms 12/02/2018  . HIV (human immunodeficiency virus infection) ([place]) 2009   11/20/2012 Last CD4 count 210 and VL <20  . 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 Hypertension 2009   Well controlled on HCTZ  . Hypokalemia 01/12/2019  . Knee effusion, right 07/19/2017  . Lobar pneumonia ([place]) 01/12/2019  . Obesity 10/19/2020  . Obstructive sleep apnea 10/19/2020  . Otitis media 03/13/2017  . Polyuria 03/13/2017  . Poorly controlled diabetes mellitus ([place]) 03/13/2017  . Type 2 diabetes mellitus ([facility])    Well controlled on metformin  . Weight loss 03/13/2017    Past Surgical History:  Procedure Laterality Date  . CARPAL TUNNEL RELEASE Right 1990s  . none      Family History  Problem Relation Age of Onset  . Diabetes Mellitus II Sister   . Colon polyps Sister   . Diabetes Mellitus II Mother   . Coronary artery disease Mother 74       CABGx6  . Coronary artery disease Sister 66       CABG  . Diabetes Mellitus II Sister   . Coronary artery disease Sister   . Diabetes Mellitus II Sister   . Colon cancer Neg Hx   . Esophageal cancer Neg Hx   . Rectal cancer Neg Hx   . Stomach cancer Neg Hx       Social History   Socioeconomic History  . Marital status: Single    Spouse name: Not on file  . Number of children: Not on  file  . Years of education: 64  . Highest education level: Not on file  Occupational History    Employer: UNEMPLOYED  Tobacco Use  . Smoking status: Former    Types: Cigarettes    Quit date: 2003    Years since quitting: 21.1  . Smokeless tobacco: Never  Vaping Use  . Vaping Use: Never used  Substance and Sexual Activity  . Alcohol use: No    Alcohol/week: 0.0 standard drinks of alcohol    Comment: Last used 2004  . Drug use: No    Comment: Last used 2003  . Sexual activity: Yes    Partners: Male    Comment: accepted condoms  Other Topics Concern  . Not on file  Social History Narrative   On disability. Lives in [place].    Social Determinants of Health   Financial Resource Strain: Low Risk  (08/30/2022)   Overall Financial Resource Strain (CARDIA)   . Difficulty of Paying Living Expenses: Not hard at all  Food Insecurity: No Food Insecurity (08/30/2022)   Hunger Vital Sign   . Worried About Charity fundraiser in the Last Year: Never true   . Ran Out of Food in the Last Year: Never true  Transportation Needs: No Transportation Needs (08/30/2022)   PRAPARE - Transportation   . Lack of Transportation (Medical): No   . Lack of Transportation (Non-Medical): No  Physical Activity: Inactive (08/30/2022)   Exercise Vital Sign   . Days of Exercise per Week: 0 days   . Minutes of Exercise per Session: 0 min  Stress: No Stress Concern Present (08/30/2022)   [place]   . Feeling of Stress : Not at all  Social Connections: Moderately Isolated (08/30/2022)   Social Connection and Isolation Panel [NHANES]   . Frequency of Communication with Friends and Family: More than three times a week   . Frequency of Social Gatherings with Friends and Family: Once a week   . Attends Religious Services: More than 4 times per year   . Active Member of Clubs or Organizations: No   . Attends Archivist Meetings:  Never   . Marital Status: Never married    No Known Allergies   Current Outpatient Medications:  .  Accu-Chek FastClix Lancets MISC, Check blood sugar before meals and after meals, Disp: 102 each, Rfl: 12 .  acetaminophen (TYLENOL) 500 MG tablet, Take 1,000 mg by mouth every 6 (six) hours as needed for fever., Disp: , Rfl:  .  Blood Glucose Monitoring Suppl (ACCU-CHEK GUIDE) w/Device KIT, 1 each by Does not apply route 6 (six) times daily., Disp: 1 kit, Rfl: 0 .  emtricitabine-rilpivir-tenofovir AF (ODEFSEY) 200-25-25 MG TABS tablet, Take 1 tablet by mouth daily with breakfast., Disp: 30 tablet, Rfl: 11 .  fexofenadine (ALLEGRA) 180 MG tablet, TAKE 1 TABLET(180 MG) BY MOUTH DAILY, Disp: 30 tablet, Rfl: 5 .  gabapentin (NEURONTIN) 300 MG capsule, TAKE 1 CAPSULE(300 MG) BY MOUTH THREE TIMES DAILY, Disp: 90 capsule, Rfl: 2 .  glucose blood (ACCU-CHEK GUIDE) test strip, Check blood sugar before meals and after meals, Disp: 100 each, Rfl: 12 .  insulin glargine (LANTUS SOLOSTAR) 100 UNIT/ML Solostar Pen, Inject 30 Units into the skin daily., Disp: 9 mL, Rfl: 11 .  Insulin Pen Needle (PEN NEEDLES) 32G X 4 MM MISC, use 1 Needle daily., Disp: 200 each, Rfl: 3 .  liraglutide (VICTOZA) 18 MG/3ML SOPN, Inject 1.2 mg into the skin daily., Disp: 6 mL, Rfl: 2 .  lisinopril (ZESTRIL) 20 MG tablet, TAKE 1 TABLET(20 MG) BY MOUTH DAILY, Disp: 90 tablet, Rfl: 3 .  metFORMIN (GLUMETZA) 1000 MG (MOD) 24 hr tablet, TAKE 2 TABLETS(1000 MG) BY MOUTH TWICE DAILY, Disp: 30 tablet, Rfl: 2 .  rosuvastatin (CRESTOR) 20 MG tablet, TAKE 1 TABLET(20 MG) BY MOUTH DAILY, Disp: 90 tablet, Rfl: 3    Review of Systems  Constitutional:  Negative for activity change, appetite change, chills, diaphoresis, fatigue, fever and unexpected weight change.  HENT:  Negative for congestion, rhinorrhea, sinus pressure, sneezing, sore throat and trouble swallowing.   Eyes:  Negative for photophobia and visual disturbance.  Respiratory:   Negative for cough, chest tightness, shortness of breath, wheezing and stridor.   Cardiovascular:  Negative for chest pain, palpitations and leg swelling.  Gastrointestinal:  Negative for abdominal distention, abdominal pain, anal bleeding, blood in stool, constipation, diarrhea, nausea and vomiting.  Genitourinary:  Negative for difficulty urinating, dysuria, flank pain and hematuria.  Musculoskeletal:  Negative for arthralgias, back pain, gait problem, joint swelling and myalgias.  Skin:  Negative for color change, pallor, rash and wound.  Neurological:  Negative for dizziness, tremors, weakness and light-headedness.  Hematological:  Negative  for adenopathy. Does not bruise/bleed easily.  Psychiatric/Behavioral:  Negative for agitation, behavioral problems, confusion, decreased concentration, dysphoric mood and sleep disturbance.       Objective:   Physical Exam Constitutional:      Appearance: He is well-developed.  HENT:     Head: Normocephalic and atraumatic.  Eyes:     Conjunctiva/sclera: Conjunctivae normal.  Cardiovascular:     Rate and Rhythm: Normal rate and regular rhythm.  Pulmonary:     Effort: Pulmonary effort is normal. No respiratory distress.     Breath sounds: No wheezing.  Abdominal:     General: There is no distension.     Palpations: Abdomen is soft.  Musculoskeletal:        General: No tenderness. Normal range of motion.     Cervical back: Normal range of motion and neck supple.  Skin:    General: Skin is warm and dry.     Coloration: Skin is not pale.     Findings: No erythema or rash.  Neurological:     General: No focal deficit present.     Mental Status: He is alert and oriented to person, place, and time.  Psychiatric:        Mood and Affect: Mood normal.        Behavior: Behavior normal.        Thought Content: Thought content normal.        Judgment: Judgment normal.         Assessment & Plan:   HIV disease:  I have reviewed Rayvon Char  Chunn's labs including viral load which was  Lab Results  Component Value Date   HIV1RNAQUANT <20 (H) 07/25/2022   and cd4 which was  Lab Results  Component Value Date   CD4TABS 451 04/23/2022     I am continuing patient's prescription for Odefsey  HTN: Pressure well controlled on lisinopril.    There were no vitals filed for this visit.     Hyperpidemia recent lipid panel reviewed with LDL of 75  Lipid Panel     Component Value Date/Time   CHOL 123 07/25/2022 0905   CHOL 161 01/24/2021 0958   TRIG 82 07/25/2022 0905   HDL 32 (L) 07/25/2022 0905   HDL 27 (L) 01/24/2021 0958   CHOLHDL 3.8 07/25/2022 0905   VLDL 18 01/13/2019 0442   LDLCALC 75 07/25/2022 0905   LABVLDL 20 01/24/2021 0958    Continue his Crestor and can follow-up with internal medicine.  Diabetes mellitus he is continue on metformin Trulicity and Tresiba   GI screen we will screen for gonorrhea chlamydia and urine oropharynx and rectum.  We will check RPR.  I discussed doxycycline postexposure prophylaxis for [place] going forward after we diagnosed and work-up is current STI  Vaccine counseling: Commended updated flu and COVID-19 vaccine

## 2022-11-26 ENCOUNTER — Ambulatory Visit: Payer: Medicaid Other | Admitting: Infectious Disease

## 2022-11-26 DIAGNOSIS — E1169 Type 2 diabetes mellitus with other specified complication: Secondary | ICD-10-CM

## 2022-11-26 DIAGNOSIS — I152 Hypertension secondary to endocrine disorders: Secondary | ICD-10-CM

## 2022-11-26 DIAGNOSIS — B20 Human immunodeficiency virus [HIV] disease: Secondary | ICD-10-CM

## 2022-11-26 DIAGNOSIS — K6282 Dysplasia of anus: Secondary | ICD-10-CM

## 2022-11-26 DIAGNOSIS — E119 Type 2 diabetes mellitus without complications: Secondary | ICD-10-CM

## 2022-11-27 ENCOUNTER — Telehealth: Payer: Self-pay

## 2022-11-27 NOTE — Telephone Encounter (Signed)
..   Medicaid Managed Care   Unsuccessful Outreach Note  11/27/2022 Name: Wayne Wolfe MRN: [id number] DOB: 1966/07/23  Referred by: [name], MD Reason for referral : Appointment   A second unsuccessful telephone outreach was attempted today. The patient was referred to the case management team for assistance with care management and care coordination.   Follow Up Plan: A HIPAA compliant phone message was left for the patient providing contact information and requesting a return call.  The care management team will reach out to the patient again over the next 7 days.    [place]

## 2022-12-03 ENCOUNTER — Other Ambulatory Visit (HOSPITAL_COMMUNITY): Payer: Self-pay

## 2022-12-05 ENCOUNTER — Telehealth: Payer: Self-pay

## 2022-12-05 DIAGNOSIS — K921 Melena: Secondary | ICD-10-CM | POA: Diagnosis not present

## 2022-12-05 NOTE — Telephone Encounter (Signed)
..   Medicaid Managed Care   Unsuccessful Outreach Note  12/05/2022 Name: Wayne Wolfe MRN: [id number] DOB: [birth year]  Referred by: [name], MD Reason for referral : Appointment   Third unsuccessful telephone outreach was attempted today. The patient was referred to the case management team for assistance with care management and care coordination. The patient's primary care provider has been notified of our unsuccessful attempts to make or maintain contact with the patient. The care management team is pleased to engage with this patient at any time in the future should he/she be interested in assistance from the care management team.   Follow Up Plan: We have been unable to make contact with the patient for follow up. The care management team is available to follow up with the patient after provider conversation with the patient regarding recommendation for care management engagement and subsequent re-referral to the care management team.   [place], [place]

## 2022-12-13 ENCOUNTER — Encounter: Payer: Medicaid Other | Admitting: Student

## 2022-12-14 ENCOUNTER — Telehealth: Payer: Self-pay

## 2022-12-14 NOTE — Telephone Encounter (Signed)
Patient called stating that he takes [facility] and wanted to know if he could take Miralax and Benefiber.

## 2022-12-14 NOTE — Telephone Encounter (Signed)
Both are ok to take with [facility]. Called patient back to review and no further questions. Estill Bamberg

## 2022-12-18 ENCOUNTER — Other Ambulatory Visit (HOSPITAL_COMMUNITY): Payer: Self-pay

## 2022-12-25 ENCOUNTER — Ambulatory Visit (INDEPENDENT_AMBULATORY_CARE_PROVIDER_SITE_OTHER): Payer: Medicaid Other

## 2022-12-25 ENCOUNTER — Ambulatory Visit: Payer: Medicaid Other | Admitting: Internal Medicine

## 2022-12-25 ENCOUNTER — Ambulatory Visit (HOSPITAL_COMMUNITY)
Admission: RE | Admit: 2022-12-25 | Discharge: 2022-12-25 | Disposition: A | Discharge status: Home / Self Care | Payer: Medicaid Other | Source: Ambulatory Visit | Attending: Internal Medicine | Admitting: Internal Medicine

## 2022-12-25 ENCOUNTER — Encounter: Payer: Self-pay | Admitting: Internal Medicine

## 2022-12-25 ENCOUNTER — Other Ambulatory Visit: Payer: Self-pay

## 2022-12-25 VITALS — BP 122/76 | HR 83 | Temp 98.2°F | Ht 67.0 in | Wt 240.7 lb

## 2022-12-25 DIAGNOSIS — E1165 Type 2 diabetes mellitus with hyperglycemia: Secondary | ICD-10-CM | POA: Diagnosis not present

## 2022-12-25 DIAGNOSIS — Z0000 Encounter for general adult medical examination without abnormal findings: Secondary | ICD-10-CM | POA: Diagnosis not present

## 2022-12-25 DIAGNOSIS — Z794 Long term (current) use of insulin: Secondary | ICD-10-CM | POA: Diagnosis not present

## 2022-12-25 DIAGNOSIS — E1159 Type 2 diabetes mellitus with other circulatory complications: Secondary | ICD-10-CM

## 2022-12-25 DIAGNOSIS — I152 Hypertension secondary to endocrine disorders: Secondary | ICD-10-CM | POA: Diagnosis not present

## 2022-12-25 DIAGNOSIS — R079 Chest pain, unspecified: Secondary | ICD-10-CM | POA: Diagnosis not present

## 2022-12-25 DIAGNOSIS — R3589 Other polyuria: Secondary | ICD-10-CM

## 2022-12-25 DIAGNOSIS — E119 Type 2 diabetes mellitus without complications: Secondary | ICD-10-CM

## 2022-12-25 LAB — POCT URINALYSIS DIPSTICK
Blood, UA: NEGATIVE
Glucose, UA: POSITIVE — AB
Ketones, UA: NEGATIVE
Leukocytes, UA: NEGATIVE
Nitrite, UA: NEGATIVE
Protein, UA: POSITIVE — AB
Spec Grav, UA: >=1.03 — AB (ref 1.010–1.025)
Urobilinogen, UA: 1 E.U./dL
pH, UA: 5.5 (ref 5.0–8.0)

## 2022-12-25 LAB — POCT GLYCOSYLATED HEMOGLOBIN (HGB A1C): Hemoglobin A1C: 11.9 % — AB (ref 4.0–5.6)

## 2022-12-25 LAB — GLUCOSE, CAPILLARY: Glucose-Capillary: 225 mg/dL — ABNORMAL HIGH (ref 70–99)

## 2022-12-25 MED ORDER — INSULIN DEGLUDEC 100 UNIT/ML ~~LOC~~ SOPN: 30.0000 [IU] | PEN_INJECTOR | Freq: Every day | SUBCUTANEOUS | 3 refills | Status: DC

## 2022-12-25 MED ORDER — METFORMIN HCL ER (MOD) 1000 MG PO TB24: ORAL_TABLET | ORAL | 2 refills | Status: DC

## 2022-12-25 MED ORDER — VICTOZA 18 MG/3ML ~~LOC~~ SOPN: 1.8000 mg | PEN_INJECTOR | Freq: Every day | SUBCUTANEOUS | 2 refills | Status: DC

## 2022-12-25 NOTE — Assessment & Plan Note (Addendum)
Urinating more, past month, odor, rushing. Has occasional slight pain or burning end of stream. No changes ot color and no blood.  Blood sugar control has not been very good. Polyuria likely related to DM. Patient is concerned about possible infection so we will check dip for patient reassurance.  POCT urine indicative of glucosuria 500mg /dl and some trace protein. Negative nitrites or leukocytes. No sign of infection. Will need to improve glucose control

## 2022-12-25 NOTE — Assessment & Plan Note (Addendum)
Reports intermittent sharp chest pain on left side. Yesterday was first time he has experienced it in long time. No specific provoking factors. Pain will last just a few seconds before spontaneously resolving. It does not radiate anywhere. It is not associated with any nausea, vomiting, sweating, or dizziness.  Heart is regular rate and rhythm and bp well controlled. History and physical most consistent with MSK pain.  Patient has multiple risk factors for heart disease including DM, HTN, HLD and obesity. EKG was checked and shows no significant interval change when compared to his prior. Overall reassuring.

## 2022-12-25 NOTE — Progress Notes (Signed)
Subjective:   Wayne Wolfe is a 57 y.o. male who presents for an Initial Medicare Annual Wellness Visit. I connected with  Wayne Wolfe on 12/25/22 by a  Face-To-Face encounter  and verified that I am speaking with the correct person using two identifiers.  Patient Location: Other:  Office/Clinic  Provider Location: Office/Clinic  I discussed the limitations of evaluation and management by telemedicine. The patient expressed understanding and agreed to proceed.  Review of Systems    Defer to PCP       Objective:    Today's Vitals   12/25/22 1142 12/25/22 1143  BP: 122/76   Pulse: 83   Temp: 98.2 F (36.8 C)   TempSrc: Oral   SpO2: 100%   Weight: 240 lb 11.2 oz (109.2 kg)   Height: 5\' 7"  (1.702 m)   PainSc:  4    Body mass index is 37.7 kg/m.     12/25/2022   11:45 AM 12/25/2022    8:55 AM 08/30/2022    9:47 AM 06/19/2022    2:44 PM 02/14/2022    1:24 PM 12/04/2021   10:31 AM 11/20/2021    9:57 AM  Advanced Directives  Does Patient Have a Medical Advance Directive? No No No No No No No  Would patient like information on creating a medical advance directive? No - Patient declined Yes (MAU/Ambulatory/Procedural Areas - Information given) No - Patient declined Yes (MAU/Ambulatory/Procedural Areas - Information given) No - Patient declined No - Patient declined No - Guardian declined    Current Medications (verified) Outpatient Encounter Medications as of 12/25/2022  Medication Sig   Accu-Chek FastClix Lancets MISC Check blood sugar before meals and after meals   acetaminophen (TYLENOL) 500 MG tablet Take 1,000 mg by mouth every 6 (six) hours as needed for fever.   Blood Glucose Monitoring Suppl (ACCU-CHEK GUIDE) w/Device KIT 1 each by Does not apply route 6 (six) times daily.   emtricitabine-rilpivir-tenofovir AF (ODEFSEY) 200-25-25 MG TABS tablet Take 1 tablet by mouth daily with breakfast.   fexofenadine (ALLEGRA) 180 MG tablet TAKE 1 TABLET(180 MG) BY MOUTH DAILY    gabapentin (NEURONTIN) 300 MG capsule TAKE 1 CAPSULE(300 MG) BY MOUTH THREE TIMES DAILY   glucose blood (ACCU-CHEK GUIDE) test strip Check blood sugar before meals and after meals   insulin degludec (TRESIBA) 100 UNIT/ML FlexTouch Pen Inject 30 Units into the skin at bedtime.   Insulin Pen Needle (PEN NEEDLES) 32G X 4 MM MISC Use daily as directed.   liraglutide (VICTOZA) 18 MG/3ML SOPN Inject 1.8 mg into the skin daily.   lisinopril (ZESTRIL) 20 MG tablet TAKE 1 TABLET(20 MG) BY MOUTH DAILY   metFORMIN (GLUMETZA) 1000 MG (MOD) 24 hr tablet TAKE 2 TABLETS(1000 MG) BY MOUTH TWICE DAILY   rosuvastatin (CRESTOR) 20 MG tablet TAKE 1 TABLET(20 MG) BY MOUTH DAILY   No facility-administered encounter medications on file as of 12/25/2022.    Allergies (verified) Patient has no known allergies.   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   Hypertension 2009   Well controlled on HCTZ  Hypokalemia 01/12/2019   Knee effusion, right 07/19/2017   Lobar pneumonia ([place]) 01/12/2019   Obesity 10/19/2020   Obstructive sleep apnea 10/19/2020   Otitis media 03/13/2017   Polyuria 03/13/2017   Poorly controlled diabetes mellitus ([place]) 03/13/2017   Type 2 diabetes mellitus ([place])    Well controlled on metformin   Weight loss 03/13/2017   Past Surgical History:  Procedure Laterality Date   CARPAL TUNNEL RELEASE Right 1990s   none     Family History  Problem Relation Age of Onset   Diabetes Mellitus II Sister    Colon polyps Sister    Diabetes Mellitus II Mother    Coronary artery disease Mother 20       CABGx6   Coronary artery  disease Sister 68       CABG   Diabetes Mellitus II Sister    Coronary artery disease Sister    Diabetes Mellitus II Sister    Colon cancer Neg Hx    Esophageal cancer Neg Hx    Rectal cancer Neg Hx    Stomach cancer Neg Hx    Social History   Socioeconomic History   Marital status: Single    Spouse name: Not on file   Number of children: Not on file   Years of education: 76   Highest education level: Not on file  Occupational History    Employer: UNEMPLOYED  Tobacco Use   Smoking status: Former    Types: Cigarettes    Quit date: 2003    Years since quitting: 21.2   Smokeless tobacco: Never  Vaping Use   Vaping Use: Never used  Substance and Sexual Activity   Alcohol use: No    Alcohol/week: 0.0 standard drinks of alcohol    Comment: Last used 2004   Drug use: No    Comment: Last used 2003   Sexual activity: Yes    Partners: Male    Comment: accepted condoms  Other Topics Concern   Not on file  Social History Narrative   On disability. Lives in [place].    Social Determinants of Health   Financial Resource Strain: Medium Risk (12/25/2022)   Overall Financial Resource Strain (CARDIA)    Difficulty of Paying Living Expenses: Somewhat hard  Food Insecurity: Food Insecurity Present (12/25/2022)   Hunger Vital Sign    Worried About Running Out of Food in the Last Year: Sometimes true    Ran Out of Food in the Last Year: Sometimes true  Transportation Needs: No Transportation Needs (12/25/2022)   PRAPARE - Hydrologist (Medical): No    Lack of Transportation (Non-Medical): No  Physical Activity: Inactive (12/25/2022)   Exercise Vital Sign    Days of Exercise per Week: 1 day    Minutes of Exercise per Session: 0 min  Stress: Stress Concern Present (12/25/2022)   [place]    Feeling of Stress : To some extent  Social Connections: Moderately Integrated (12/25/2022)    Social Connection and Isolation Panel [NHANES]    Frequency of Communication with Friends and Family: More than three times a week    Frequency of Social Gatherings with Friends and Family: Three times a week    Attends Religious Services: More than 4 times per year    Active Member of Clubs or Organizations: Yes    Attends Archivist Meetings: 1 to 4 times per year    Marital Status: Never married  Tobacco Counseling Counseling given: Not Answered   Clinical Intake:  Pre-visit preparation completed: Yes  Pain : 0-10 Pain Score: 4  Pain Type: Chronic pain Pain Location: Knee Pain Orientation: Right Pain Descriptors / Indicators: Constant Pain Onset: More than a month ago Pain Frequency: Constant Pain Relieving Factors: tylenol Effect of Pain on Daily Activities: pain during movement  Pain Relieving Factors: tylenol  Nutritional Risks: None Diabetes: Yes CBG done?: Yes CBG resulted in Enter/ Edit results?: Yes Did pt. bring in CBG monitor from home?: No  How often do you need to have someone help you when you read instructions, pamphlets, or other written materials from your doctor or pharmacy?: 1 - Never What is the last grade level you completed in school?: 13 years  Diabetic?Nutrition Risk Assessment:  Has the patient had any N/V/D within the last 2 months?  No  Does the patient have any non-healing wounds?  No  Has the patient had any unintentional weight loss or weight gain?  No   Diabetes:  Is the patient diabetic?  Yes  If diabetic, was a CBG obtained today?  Yes  Did the patient bring in their glucometer from home?  No   Financial Strains and Diabetes Management:  Are you having any financial strains with the device, your supplies or your medication? No .  Does the patient want to be seen by Chronic Care Management for management of their diabetes?  No  Would the patient like to be referred to a Nutritionist or for Diabetic Management?  No    Diabetic Exams:  Diabetic Eye Exam: Overdue for diabetic eye exam. Pt has been advised about the importance in completing this exam. Patient advised to call and schedule an eye exam. Diabetic Foot Exam: Completed 01/01/2022    Interpreter Needed?: No  Information entered by :: Elenor Wildes,cma   Activities of Daily Living    12/25/2022   11:45 AM 12/25/2022    8:55 AM  In your present state of health, do you have any difficulty performing the following activities:  Hearing? 0 0  Vision? 0 0  Difficulty concentrating or making decisions? 0 0  Walking or climbing stairs? 0 0  Dressing or bathing? 0 0  Doing errands, shopping? 0 0    Patient Care Team: Tommy Medal, Lavell Islam, MD as PCP - Infectious Diseases (Infectious Diseases)  Indicate any recent Medical Services you may have received from other than [facility] providers in the past year (date may be approximate).     Assessment:   This is a routine wellness examination for Wayne Wolfe.  Hearing/Vision screen No results found.  Dietary issues and exercise activities discussed:     Goals Addressed   None   Depression Screen    12/25/2022   11:45 AM 12/25/2022    8:55 AM 08/30/2022    9:46 AM 08/08/2022    9:17 AM 06/19/2022    2:42 PM 05/08/2022    8:44 AM 02/14/2022    1:22 PM  PHQ 2/9 Scores  PHQ - 2 Score 0 0 0 0 0 1 0  PHQ- 9 Score 0 0   1  1    Fall Risk    12/25/2022   11:45 AM 12/25/2022    8:55 AM 08/30/2022    9:46 AM 08/08/2022    9:17 AM 06/19/2022    2:42 PM  [place] in the past year? 0 0 0 0 0  Number falls in past yr: 0  0 0   Injury with Fall? 0  0 0   Risk for fall due to : No Fall Risks No Fall Risks No Fall Risks  No Fall Risks  Follow up Falls evaluation completed;Falls prevention discussed Falls evaluation completed Falls evaluation completed;Falls prevention discussed  Falls evaluation completed    FALL RISK PREVENTION PERTAINING TO THE HOME:  Any stairs in or around the home?  Patient  declined If so, are there any without handrails? Patient declined Home free of loose throw rugs in walkways, pet beds, electrical cords, etc? Patient declined Adequate lighting in your home to reduce risk of falls? Patient declined  ASSISTIVE DEVICES UTILIZED TO PREVENT FALLS:  Life alert? No  Use of a cane, walker or w/c? No  Grab bars in the bathroom? Yes  Shower chair or bench in shower? No  Elevated toilet seat or a handicapped toilet? No   TIMED UP AND GO:  Was the test performed? No .  Length of time to ambulate 10 feet: 0 sec.   Gait slow and steady without use of assistive device  Cognitive Function:        12/25/2022   11:46 AM  6CIT Screen  What Year? 0 points  What month? 0 points  What time? 0 points  Count back from 20 0 points  Months in reverse 0 points  Repeat phrase 0 points  Total Score 0 points    Immunizations Immunization History  Administered Date(s) Administered   COVID-19, mRNA, vaccine(Comirnaty)12 years and older 08/08/2022   H1N1 10/04/2008   Hepatitis A 08/24/2008, 12/06/2009   Hepatitis A, Ped/Adol-2 Dose 08/24/2008, 12/06/2009   Hepatitis B 04/08/2008, 08/24/2008, 12/17/2008   Hepatitis B, PED/ADOLESCENT 04/08/2008, 08/24/2008, 12/17/2008   Influenza Split 08/13/2011, 06/25/2012   Influenza Whole 07/14/2008, 08/02/2009, 07/10/2010   Influenza,inj,Quad PF,6+ Mos 06/15/2013, 07/29/2014, 07/28/2015, 07/26/2016, 07/17/2017, 06/19/2018, 06/11/2019, 07/12/2020, 06/19/2022   Influenza-Unspecified 08/31/2021   Meningococcal Conjugate 10/04/2016, 11/29/2016   Meningococcal Mcv4o 10/04/2016, 11/29/2016   PFIZER Comirnaty(Gray Top)Covid-19 Tri-Sucrose Vaccine 04/13/2021   PFIZER(Purple Top)SARS-COV-2 Vaccination 12/16/2019, 01/06/2020, 10/07/2020   PNEUMOCOCCAL CONJUGATE-20 10/30/2021   Pfizer Covid-19 Vaccine Bivalent Booster 70yrs & up 09/20/2021   Pneumococcal Conjugate-13 06/19/2018   Pneumococcal Polysaccharide-23 04/08/2008, 02/03/2013    Tdap 07/25/2018    TDAP status: Up to date  Flu Vaccine status: Up to date  Pneumococcal vaccine status: Due, Education has been provided regarding the importance of this vaccine. Advised may receive this vaccine at local pharmacy or Health Dept. Aware to provide a copy of the vaccination record if obtained from local pharmacy or Health Dept. Verbalized acceptance and understanding.  Covid-19 vaccine status: Completed vaccines  Qualifies for Shingles Vaccine? No   Zostavax completed No   Shingrix Completed?: No.    Education has been provided regarding the importance of this vaccine. Patient has been advised to call insurance company to determine out of pocket expense if they have not yet received this vaccine. Advised may also receive vaccine at local pharmacy or Health Dept. Verbalized acceptance and understanding.  Screening Tests Health Maintenance  Topic Date Due   Zoster Vaccines- Shingrix (1 of 2) Never done   OPHTHALMOLOGY EXAM  12/23/2013   COVID-19 Vaccine (7 - 2023-24 season) 10/03/2022   FOOT EXAM  01/02/2023   HEMOGLOBIN A1C  03/27/2023   Diabetic kidney evaluation - eGFR measurement  07/26/2023   LIPID PANEL  07/26/2023   Diabetic kidney evaluation - Urine ACR  08/31/2023   Medicare Annual Wellness (AWV)  12/25/2023   DTaP/Tdap/Td (2 - Td or Tdap) 07/25/2028   COLONOSCOPY (Pts 45-37yrs Insurance coverage will need to be confirmed)  01/25/2030   INFLUENZA VACCINE  Completed   Hepatitis C Screening  Completed   HIV Screening  Completed   HPV VACCINES  Aged Out    Health Maintenance  Health Maintenance Due  Topic Date Due   Zoster Vaccines- Shingrix (1 of 2) Never done   OPHTHALMOLOGY EXAM  12/23/2013   COVID-19 Vaccine (7 - 2023-24 season) 10/03/2022    Colorectal cancer screening: Type of screening: Colonoscopy. Completed 01/23/2020. Repeat every 10 years  Lung Cancer Screening: (Low Dose CT Chest recommended if Age 70-80 years, 30 pack-year currently  smoking OR have quit w/in 15years.) does not qualify.   Lung Cancer Screening Referral: N/A  Additional Screening:  Hepatitis C Screening: does not qualify; Completed 05/18/2020  Vision Screening: Recommended annual ophthalmology exams for early detection of glaucoma and other disorders of the eye. Is the patient up to date with their annual eye exam?  No  Who is the provider or what is the name of the office in which the patient attends annual eye exams? N/A If pt is not established with a provider, would they like to be referred to a provider to establish care? Yes .   Dental Screening: Recommended annual dental exams for proper oral hygiene  Community Resource Referral / Chronic Care Management: CRR required this visit?  No   CCM required this visit?  Yes      Plan:     I have personally reviewed and noted the following in the patient's chart:   Medical and social history Use of alcohol, tobacco or illicit drugs  Current medications and supplements including opioid prescriptions. Patient is not currently taking opioid prescriptions. Functional ability and status Nutritional status Physical activity Advanced directives List of other physicians Hospitalizations, surgeries, and ER visits in previous 12 months Vitals Screenings to include cognitive, depression, and falls Referrals and appointments  In addition, I have reviewed and discussed with patient certain preventive protocols, quality metrics, and best practice recommendations. A written personalized care plan for preventive services as well as general preventive health recommendations were provided to patient.     Kerin Perna, [facility]   12/25/2022   Nurse Notes: Face-To-Face Visit  Wayne Wolfe , Thank you for taking time to come for your Medicare Wellness Visit. I appreciate your ongoing commitment to your health goals. Please review the following plan we discussed and let me know if I can assist you in the future.    These are the goals we discussed:  Goals      Blood Pressure < 140/90     HEMOGLOBIN A1C < 7.0     LDL CALC < 100        This is a list of the screening recommended for you and due dates:  Health Maintenance  Topic Date Due   Zoster (Shingles) Vaccine (1 of 2) Never done   Eye exam for diabetics  12/23/2013   COVID-19 Vaccine (7 - 2023-24 season) 10/03/2022   Complete foot exam   01/02/2023   Hemoglobin A1C  03/27/2023   Yearly kidney function blood test for diabetes  07/26/2023   Lipid (cholesterol) test  07/26/2023   Yearly kidney health urinalysis for diabetes  08/31/2023   Medicare Annual Wellness Visit  12/25/2023   DTaP/Tdap/Td vaccine (2 - Td or Tdap) 07/25/2028   Colon Cancer Screening  01/25/2030   Flu  Shot  Completed   Hepatitis C Screening: USPSTF Recommendation to screen - Ages 8-79 yo.  Completed   HIV Screening  Completed   HPV Vaccine  Aged Out

## 2022-12-25 NOTE — Progress Notes (Signed)
CC: A1c/ DM  HPI:[name] is a 57 y.o. male who presents for evaluation of DM. Please see individual problem based A/P for details.  40 yom with hx of HTN, DM, OSA, HIV and anal dysplasia  Type 2 diabetes mellitus ([place]) Subjectively thinks that glucose control has worsened since he switched from [place] and Trulicity to victoza and lantus. Switch was made because previous medicines were not covered by insurance. Patient now has insurance again and is wanting to switch back to [place]. He does not think he has been taking the metformin. Says AM CBG typically low 100s, PM CBG typically around 200. Patient also complains of increased polyuria over the last month.  A1c worsened to 11.9 up from 8.9. Polyuria likely related to this.  Will stop lantus and restart tresiba 30 units. Increase Victoza to 1.8. Reordered metformin 1000 BID for patient to restart. Referral to ophtho placed.    Hypertension associated with diabetes ([place]) Compliant with lisinopril. No complaints BP well controlled today Continue current regimen  Polyuria Urinating more, past month, odor, rushing. Has occasional slight pain or burning end of stream. No changes ot color and no blood.  Blood sugar control has not been very good. Polyuria likely related to DM. Patient is concerned about possible infection so we will check dip for patient reassurance.  POCT urine indicative of glucosuria 500mg /dl and some trace protein. Negative nitrites or leukocytes. No sign of infection. Will need to improve glucose control  Chest pain Reports intermittent sharp chest pain on left side. Yesterday was first time he has experienced it in long time. No specific provoking factors. Pain will last just a few seconds before spontaneously resolving. It does not radiate anywhere. It is not associated with any nausea, vomiting, sweating, or dizziness.  Heart is regular rate and rhythm and bp well controlled. History and physical most  consistent with MSK pain.  Patient has multiple risk factors for heart disease including DM, HTN, HLD and obesity. EKG was checked and shows no significant interval change when compared to his prior. Overall reassuring.    Depression, PHQ-9: Based on the patients  Christine Visit from 12/25/2022 in [place]  PHQ-9 Total Score 0      score we have .  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   Hypertension 2009   Well controlled on HCTZ   Hypokalemia 01/12/2019   Knee effusion, right 07/19/2017   Lobar pneumonia ([place]) 01/12/2019   Obesity 10/19/2020   Obstructive sleep apnea 10/19/2020   Otitis media 03/13/2017   Polyuria 03/13/2017   Poorly controlled diabetes mellitus ([place]) 03/13/2017   Type 2 diabetes mellitus ([place])    Well controlled on metformin   Weight loss 03/13/2017   Review of Systems:   See HPI  Physical Exam: Vitals:   12/25/22 0856  BP: 122/76  Pulse: 83  Temp: 98.2 F (36.8 C)  TempSrc: Oral  SpO2: 100%  Weight: 240 lb 11.2 oz (109.2 kg)  Height: 5\' 7"  (1.702 m)     General: NAD, obese HEENT: Conjunctiva nl ,  antiicteric sclerae, moist mucous membranes, no exudate or erythema Cardiovascular: Normal rate, regular rhythm.  No murmurs, rubs, or gallops Pulmonary : Equal breath sounds, No wheezes, rales, or rhonchi Abdominal: soft, nontender,  bowel sounds present Ext: No edema in lower extremities, no tenderness to palpation of lower extremities.   Assessment & Plan:   See Encounters Tab for problem based charting.  Patient discussed with Dr. Daryll Drown

## 2022-12-25 NOTE — Assessment & Plan Note (Signed)
Subjectively thinks that glucose control has worsened since he switched from [place] and Trulicity to victoza and lantus. Switch was made because previous medicines were not covered by insurance. Patient now has insurance again and is wanting to switch back to [place]. He does not think he has been taking the metformin. Says AM CBG typically low 100s, PM CBG typically around 200. Patient also complains of increased polyuria over the last month.  A1c worsened to 11.9 up from 8.9. Polyuria likely related to this.  Will stop lantus and restart tresiba 30 units. Increase Victoza to 1.8. Reordered metformin 1000 BID for patient to restart. Referral to ophtho placed.

## 2022-12-25 NOTE — Assessment & Plan Note (Signed)
Compliant with lisinopril. No complaints BP well controlled today Continue current regimen

## 2022-12-25 NOTE — Patient Instructions (Addendum)
Dear Wayne Wolfe,  Thank you for trusting [place] with your care.   We discussed your diabetes we will restart the Tresiba 30 units. Stop the lantus. Continue taking the metformin 1000mg  BID. We are increasing the Victoza to 1.8.I have placed a referral for an eye exam.   We will check a urine dipstick for your increased urination. If necessary, we will start antibiotics for you.   We will check an EKG for your chest pain.   Your blood pressure is well controlled, keep up the great work. Please continue to follow with the infectious disease doctors and [facility] for your other issues.   We would like to see you back in 1 month to see how you are doing with the diabetes medicines. Please bring your glucometer with you.

## 2023-01-02 ENCOUNTER — Other Ambulatory Visit: Payer: Self-pay | Admitting: Student

## 2023-01-02 ENCOUNTER — Other Ambulatory Visit (HOSPITAL_COMMUNITY): Payer: Self-pay

## 2023-01-02 NOTE — Progress Notes (Signed)
Internal Medicine Clinic Attending  Case discussed with Dr. Gawaluck  at the time of the visit.  We reviewed the resident's history and exam and pertinent patient test results.  I agree with the assessment, diagnosis, and plan of care documented in the resident's note.  

## 2023-01-04 ENCOUNTER — Other Ambulatory Visit (HOSPITAL_COMMUNITY): Payer: Self-pay

## 2023-01-07 ENCOUNTER — Telehealth: Payer: Self-pay

## 2023-01-07 NOTE — Progress Notes (Signed)
Internal Medicine Clinic Attending  Case and documentation reviewed.  I reviewed the AWV findings.  I agree with the assessment, diagnosis, and plan of care documented in the AWV note.    Sharline Lehane, MD    

## 2023-01-07 NOTE — Telephone Encounter (Signed)
Return call from pt stating [facility] is having trouble filling [place] rx d/t his insurance. And he prefers to be back on Lantus if Evaristo Bury is not covered by his insurance; and filled at [facility]. I called [facility] who stated Evaristo Bury will be ready by tomorrow @ 1345 PM. I asked this person if he could run the rx to see if it is covered by the pt's insurance - he stated it's pending and to call back in 45 mins. I called the pt back to let him know what [facility] had stated. Pt stated he will call [facility] back and if Evaristo Bury is not covered by his insurance, he will call our office back.

## 2023-01-07 NOTE — Telephone Encounter (Signed)
Patient called regarding rx for Evaristo Bury patient stated he is unable to get the medication due to having problems with his insurance. Patient is requesting to be switched back to regular lantus, patient is also requesting rx for victoza to be sent to [facility]. Please send both rx to [facility].

## 2023-01-09 NOTE — Telephone Encounter (Signed)
Call from pt stating he's having trouble getting Wayne Wolfe b/c of his insurance. Pt requesting to be back on Lantus. He's out of insulin.  I called [facility] at [facility]; unable to speak to anyone - "call cannot be completed at this time.". Send new rx to [facility].

## 2023-01-09 NOTE — Telephone Encounter (Signed)
Pt is requesting a call back  from the last phone call 4/8

## 2023-01-09 NOTE — Telephone Encounter (Signed)
[facility] states pt's insurance will not cover [place]. No particular reason was given when she re- processed the rx again; states to call [phone].

## 2023-01-10 ENCOUNTER — Telehealth: Payer: Self-pay

## 2023-01-10 ENCOUNTER — Other Ambulatory Visit: Payer: Self-pay | Admitting: Internal Medicine

## 2023-01-10 ENCOUNTER — Other Ambulatory Visit: Payer: Self-pay

## 2023-01-10 ENCOUNTER — Other Ambulatory Visit (HOSPITAL_COMMUNITY): Payer: Self-pay

## 2023-01-10 DIAGNOSIS — E1165 Type 2 diabetes mellitus with hyperglycemia: Secondary | ICD-10-CM

## 2023-01-10 DIAGNOSIS — E1169 Type 2 diabetes mellitus with other specified complication: Secondary | ICD-10-CM

## 2023-01-10 MED ORDER — INSULIN GLARGINE 100 UNIT/ML SOLOSTAR PEN
30.0000 [IU] | PEN_INJECTOR | Freq: Every day | SUBCUTANEOUS | 3 refills | Status: DC
  Filled 2023-01-10: qty 15, 50d supply, fill #0

## 2023-01-10 MED ORDER — ROSUVASTATIN CALCIUM 20 MG PO TABS: ORAL_TABLET | ORAL | 3 refills | Status: DC

## 2023-01-10 MED ORDER — VICTOZA 18 MG/3ML ~~LOC~~ SOPN
1.8000 mg | PEN_INJECTOR | Freq: Every day | SUBCUTANEOUS | 2 refills | Status: DC
Start: 2023-01-10 — End: 2023-02-20

## 2023-01-10 MED ORDER — INSULIN GLARGINE 100 UNIT/ML ~~LOC~~ SOLN: 30.0000 [IU] | Freq: Every day | SUBCUTANEOUS | 3 refills | Status: DC

## 2023-01-10 NOTE — Telephone Encounter (Signed)
DECISION :    Approved  today   Request Reference Number: [id number]. VICTOZA INJ 18MG /3ML is approved through 01/10/2024.      For further questions, call [employer] at [phone].    Authorization Expiration Date: 01/10/2024    Drug Victoza 18MG Ronny Bacon pen-injectors ePA cloud logo    Form [employer] Medicaid Electronic Prior Authorization Form (2017 NCPDP)     ( COPY SENT TO PHARMACY  AND PLACED TO SCANNED TO CHART )

## 2023-01-10 NOTE — Telephone Encounter (Signed)
Sorry to hear that the Wayne Wolfe ended up not going through and he wasn't able to get in touch with anyone from pharmacy. I have sent in his old Lantus Rx to [facility] and refilled the victoza through [facility]

## 2023-01-10 NOTE — Telephone Encounter (Signed)
Pa for pt ( VICTOZA ) came through  on cover my meds was submitted with office notes  and last labs .Marland Kitchen Awaiting approval or denial

## 2023-01-23 ENCOUNTER — Encounter: Payer: Medicaid Other | Admitting: Internal Medicine

## 2023-01-23 NOTE — Progress Notes (Deleted)
   CC: 4 week f/u  HPI:Mr.TERYN GUST is a 57 y.o. male who presents for evaluation of ***. Please see individual problem based A/P for details.  Anal dysplasia - [facility] *** who seeing? *** what have they done for this? *** when last see?  DM and obesity - 3/24 A1c 12 - lantus and victoza - due for foot - seen eye? HLD - on crestor 20 HTN - lisinopril  Syphilis *** last titer 1:1 seems to be adequately controlled and last checked nov 23.  HIV - has been following with ID   Depression, PHQ-9: Based on the patients  Flowsheet Row Office Visit from 12/25/2022 in [facility]  PHQ-9 Total Score 0      score we have ***.  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   General: *** HEENT: Conjunctiva nl , antiicteric sclerae, moist mucous membranes, no exudate or erythema Cardiovascular: Normal rate, regular rhythm.  No murmurs, rubs, or gallops Pulmonary : Equal breath sounds, No wheezes, rales, or rhonchi Abdominal: soft, nontender,  bowel sounds present Ext: No edema in lower extremities, no  tenderness to palpation of lower extremities.   Assessment & Plan:   See Encounters Tab for problem based charting.  Patient {GC/GE:[id number]::"discussed with","seen with"} Dr. {[id number]::"N. Hoffman","Guilloud","Mullen","Narendra","Raines","Vincent","Williams"}

## 2023-01-24 DIAGNOSIS — R350 Frequency of micturition: Secondary | ICD-10-CM | POA: Diagnosis not present

## 2023-02-01 ENCOUNTER — Telehealth: Payer: Self-pay

## 2023-02-01 NOTE — Progress Notes (Signed)
   Care Guide Note  02/01/2023 Name: Wayne Wolfe MRN: [id number] DOB: [birth year]  Referred by: Adron Bene, MD Reason for referral : Care Coordination (Outreach to schedule with Pharm d New MM DM )   Wayne Wolfe is a 57 y.o. year old male who is a primary care patient of Adron Bene, MD. Wayne Wolfe was referred to the pharmacist for assistance related to DM.    Successful contact was made with the patient to discuss pharmacy services including being ready for the pharmacist to call at least 5 minutes before the scheduled appointment time, to have medication bottles and any blood sugar or blood pressure readings ready for review. The patient agreed to meet with the pharmacist via with the pharmacist via telephone visit on (date/time).  02/04/2023   Wayne Wolfe, RMA Care Guide [facility]  [place], [place] [address] Direct Dial: [phone] [email or web address]

## 2023-02-04 ENCOUNTER — Telehealth: Payer: Self-pay | Admitting: Pharmacist

## 2023-02-04 ENCOUNTER — Other Ambulatory Visit: Payer: Medicaid Other | Admitting: Pharmacist

## 2023-02-04 NOTE — Progress Notes (Signed)
Patient appearing on report for newly acquired Managed Medicaid and elevated A1c.  Outreached patient to discuss glucose control and medication management. Patient first answered phone and asked for callback in 15 minutes. Completed callback - no answer x2. Left voicemail for patient to return my call at their convenience.   Lynnda Shields, PharmD, BCPS Clinical Pharmacist [facility]

## 2023-02-14 ENCOUNTER — Other Ambulatory Visit: Payer: Self-pay

## 2023-02-14 ENCOUNTER — Other Ambulatory Visit: Payer: Medicaid Other

## 2023-02-14 ENCOUNTER — Other Ambulatory Visit (HOSPITAL_COMMUNITY)
Admission: RE | Admit: 2023-02-14 | Discharge: 2023-02-14 | Disposition: A | Discharge status: Home / Self Care | Payer: Medicaid Other | Source: Ambulatory Visit | Attending: Infectious Disease | Admitting: Infectious Disease

## 2023-02-14 DIAGNOSIS — B20 Human immunodeficiency virus [HIV] disease: Secondary | ICD-10-CM | POA: Diagnosis not present

## 2023-02-14 LAB — COMPLETE METABOLIC PANEL WITH GFR
Albumin: 4.2 g/dL (ref 3.6–5.1)
Creat: 1 mg/dL (ref 0.70–1.30)
Globulin: 2.6 g/dL (calc) (ref 1.9–3.7)
Glucose, Bld: 227 mg/dL — ABNORMAL HIGH (ref 65–99)
Potassium: 4.1 mmol/L (ref 3.5–5.3)
Sodium: 137 mmol/L (ref 135–146)
eGFR: 87 mL/min/{1.73_m2} (ref >=60)

## 2023-02-14 LAB — CBC WITH DIFFERENTIAL/PLATELET
Basophils Relative: 1.6 %
HCT: 48.6 % (ref 38.5–50.0)
Lymphs Abs: 2107 cells/uL (ref 850–3900)
MCHC: 33.5 g/dL (ref 32.0–36.0)
MPV: 9.9 fL (ref 7.5–12.5)
Neutrophils Relative %: 37 %
Platelets: 291 10*3/uL (ref 140–400)
Total Lymphocyte: 49 %

## 2023-02-15 LAB — URINE CYTOLOGY ANCILLARY ONLY
Chlamydia: NEGATIVE
Comment: NEGATIVE
Comment: NORMAL
Neisseria Gonorrhea: NEGATIVE

## 2023-02-15 LAB — RPR: RPR Ser Ql: REACTIVE — AB

## 2023-02-15 LAB — HEPATITIS C ANTIBODY: Hepatitis C Ab: NONREACTIVE

## 2023-02-15 LAB — T-HELPER CELL (CD4) - (RCID CLINIC ONLY)
CD4 % Helper T Cell: 25 % — ABNORMAL LOW (ref 33–65)
CD4 T Cell Abs: 458 /uL (ref 400–1790)

## 2023-02-17 LAB — HIV-1 RNA QUANT-NO REFLEX-BLD: HIV 1 RNA Quant: NOT DETECTED {copies}/mL

## 2023-02-18 LAB — CBC WITH DIFFERENTIAL/PLATELET
Absolute Monocytes: 391 cells/uL (ref 200–950)
Basophils Absolute: 69 cells/uL (ref 0–200)
Eosinophils Absolute: 142 cells/uL (ref 15–500)
Eosinophils Relative: 3.3 %
Hemoglobin: 16.3 g/dL (ref 13.2–17.1)
MCH: 29.7 pg (ref 27.0–33.0)
MCV: 88.7 fL (ref 80.0–100.0)
Monocytes Relative: 9.1 %
Neutro Abs: 1591 cells/uL (ref 1500–7800)
RBC: 5.48 10*6/uL (ref 4.20–5.80)
RDW: 12.9 % (ref 11.0–15.0)
WBC: 4.3 10*3/uL (ref 3.8–10.8)

## 2023-02-18 LAB — COMPLETE METABOLIC PANEL WITH GFR
AG Ratio: 1.6 (calc) (ref 1.0–2.5)
ALT: 15 U/L (ref 9–46)
AST: 10 U/L (ref 10–35)
Alkaline phosphatase (APISO): 66 U/L (ref 35–144)
BUN: 13 mg/dL (ref 7–25)
CO2: 28 mmol/L (ref 20–32)
Calcium: 9.2 mg/dL (ref 8.6–10.3)
Chloride: 101 mmol/L (ref 98–110)
Total Bilirubin: 0.5 mg/dL (ref 0.2–1.2)
Total Protein: 6.8 g/dL (ref 6.1–8.1)

## 2023-02-18 LAB — LIPID PANEL
Cholesterol: 116 mg/dL (ref <200)
HDL: 30 mg/dL — ABNORMAL LOW (ref >=40)
LDL Cholesterol (Calc): 68 mg/dL (calc)
Non-HDL Cholesterol (Calc): 86 mg/dL (calc) (ref <130)
Total CHOL/HDL Ratio: 3.9 (calc) (ref <5.0)
Triglycerides: 97 mg/dL (ref <150)

## 2023-02-18 LAB — RPR TITER: RPR Titer: 1:2 {titer} — ABNORMAL HIGH

## 2023-02-18 LAB — T PALLIDUM AB: T Pallidum Abs: POSITIVE — AB

## 2023-02-18 LAB — HIV-1 RNA QUANT-NO REFLEX-BLD: HIV-1 RNA Quant, Log: NOT DETECTED {Log_copies}/mL

## 2023-02-20 ENCOUNTER — Encounter: Payer: Self-pay | Admitting: Student

## 2023-02-20 ENCOUNTER — Other Ambulatory Visit: Payer: Self-pay

## 2023-02-20 ENCOUNTER — Ambulatory Visit: Payer: Medicaid Other | Admitting: Student

## 2023-02-20 ENCOUNTER — Other Ambulatory Visit (HOSPITAL_COMMUNITY): Payer: Self-pay

## 2023-02-20 DIAGNOSIS — E1169 Type 2 diabetes mellitus with other specified complication: Secondary | ICD-10-CM

## 2023-02-20 DIAGNOSIS — Z6836 Body mass index (BMI) 36.0-36.9, adult: Secondary | ICD-10-CM

## 2023-02-20 DIAGNOSIS — Z794 Long term (current) use of insulin: Secondary | ICD-10-CM | POA: Diagnosis not present

## 2023-02-20 DIAGNOSIS — Z7984 Long term (current) use of oral hypoglycemic drugs: Secondary | ICD-10-CM

## 2023-02-20 DIAGNOSIS — R0789 Other chest pain: Secondary | ICD-10-CM

## 2023-02-20 DIAGNOSIS — E1165 Type 2 diabetes mellitus with hyperglycemia: Secondary | ICD-10-CM

## 2023-02-20 DIAGNOSIS — R079 Chest pain, unspecified: Secondary | ICD-10-CM

## 2023-02-20 MED ORDER — GABAPENTIN 300 MG PO CAPS
300.0000 mg | ORAL_CAPSULE | Freq: Three times a day (TID) | ORAL | 2 refills | Status: DC
Start: 2023-02-20 — End: 2024-07-13
  Filled 2023-02-20 – 2023-03-05 (×2): qty 90, 30d supply, fill #0
  Filled 2023-08-16: qty 90, 30d supply, fill #1

## 2023-02-20 MED ORDER — METFORMIN HCL ER (MOD) 1000 MG PO TB24
1000.0000 mg | ORAL_TABLET | Freq: Two times a day (BID) | ORAL | 3 refills | Status: DC
Start: 2023-02-20 — End: 2023-02-21
  Filled 2023-02-20: qty 180, 90d supply, fill #0

## 2023-02-20 MED ORDER — LIRAGLUTIDE 18 MG/3ML ~~LOC~~ SOPN
1.8000 mg | PEN_INJECTOR | Freq: Every day | SUBCUTANEOUS | 2 refills | Status: DC
Start: 2023-02-20 — End: 2023-07-30
  Filled 2023-02-20 – 2023-03-05 (×2): qty 9, 30d supply, fill #0

## 2023-02-20 MED ORDER — INSULIN GLARGINE 100 UNIT/ML SOLOSTAR PEN
30.0000 [IU] | PEN_INJECTOR | Freq: Every day | SUBCUTANEOUS | 3 refills | Status: DC
Start: 2023-02-20 — End: 2023-10-10
  Filled 2023-02-20 – 2023-03-05 (×2): qty 15, 50d supply, fill #0
  Filled 2023-05-07: qty 15, 50d supply, fill #1
  Filled 2023-06-27: qty 15, 50d supply, fill #2
  Filled 2023-08-14: qty 15, 50d supply, fill #3

## 2023-02-20 NOTE — Patient Instructions (Addendum)
We will send a referral for cardiology so you can get a stress test.   Bring your glucometer.

## 2023-02-21 ENCOUNTER — Other Ambulatory Visit: Payer: Self-pay | Admitting: Student

## 2023-02-21 ENCOUNTER — Other Ambulatory Visit (HOSPITAL_COMMUNITY): Payer: Self-pay

## 2023-02-21 DIAGNOSIS — Z794 Long term (current) use of insulin: Secondary | ICD-10-CM

## 2023-02-21 MED ORDER — METFORMIN HCL ER 500 MG PO TB24
1000.0000 mg | ORAL_TABLET | Freq: Two times a day (BID) | ORAL | 2 refills | Status: DC
Start: 2023-02-21 — End: 2024-07-27
  Filled 2023-02-21 – 2023-03-05 (×2): qty 360, 90d supply, fill #0

## 2023-02-21 NOTE — Progress Notes (Deleted)
Chief complaint: Follow-up for HIV disease on medications is also experiencing some burning with urination Subjective:    Patient ID: Wayne Wolfe, male    DOB: [birth year], 57 y.o.   MRN: [id number]  HIV Positive/AIDS   57 year old man previously perfectly suppressed on Prezista Norvir and Truvada whom I changed over to Atripla and then to Complera to [facility], excellent virological control.    He is followed by internal medicine and his A1c has come down to 8.9.  He is also followed at [facility] for high resolution anoscopy.  Viron is doing well and continues to be happy with Charlett Lango and knows how to take it with chewable food and avoid antiacids.  It is little bit concerned that he could have a sexually transmitted infection.  He had recently had some unprotected mutual sex orally, used condom with anal sex but concerned he could have an STI.       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   Hypertension 2009   Well controlled on HCTZ   Hypokalemia 01/12/2019   Knee effusion, right 07/19/2017   Lobar pneumonia (HCC) 01/12/2019   Obesity 10/19/2020   Obstructive sleep apnea 10/19/2020   Otitis media 03/13/2017   Polyuria 03/13/2017   Poorly controlled diabetes mellitus (HCC) 03/13/2017   Type 2 diabetes mellitus (HCC)    Well controlled on metformin   Weight loss 03/13/2017    Past Surgical History:  Procedure Laterality Date   CARPAL TUNNEL RELEASE Right 1990s   none      Family History  Problem Relation Age of Onset   Diabetes Mellitus II Sister    Colon polyps Sister    Diabetes Mellitus II Mother     Coronary artery disease Mother 30       CABGx6   Coronary artery disease Sister 8       CABG   Diabetes Mellitus II Sister    Coronary artery disease Sister    Diabetes Mellitus II Sister    Colon cancer Neg Hx    Esophageal cancer Neg Hx    Rectal cancer Neg Hx    Stomach cancer Neg Hx       Social History   Socioeconomic History   Marital status: Single    Spouse name: Not on file   Number of children: Not on file   Years of education: 13   Highest education level: Not on file  Occupational History    Employer: UNEMPLOYED  Tobacco Use   Smoking status: Former    Types: Cigarettes    Quit date: 2003    Years since quitting: 21.4   Smokeless tobacco: Never  Vaping Use   Vaping Use: Never used  Substance and Sexual Activity   Alcohol use: No    Alcohol/week: 0.0 standard drinks of alcohol    Comment: Last used 2004   Drug use: No    Comment: Last used 2003   Sexual activity: Yes    Partners: Male    Comment: accepted condoms  Other Topics Concern   Not on file  Social History  Narrative   On disability. Lives in [place].    Social Determinants of Health   Financial Resource Strain: Medium Risk (12/25/2022)   Overall Financial Resource Strain (CARDIA)    Difficulty of Paying Living Expenses: Somewhat hard  Food Insecurity: Food Insecurity Present (12/25/2022)   Hunger Vital Sign    Worried About Running Out of Food in the Last Year: Sometimes true    Ran Out of Food in the Last Year: Sometimes true  Transportation Needs: No Transportation Needs (12/25/2022)   PRAPARE - Administrator, Civil Service (Medical): No    Lack of Transportation (Non-Medical): No  Physical Activity: Inactive (12/25/2022)   Exercise Vital Sign    Days of Exercise per Week: 1 day    Minutes of Exercise per Session: 0 min  Stress: Stress Concern Present (12/25/2022)   [employer] of Occupational Health - Occupational Stress Questionnaire    Feeling of Stress : To  some extent  Social Connections: Moderately Integrated (12/25/2022)   Social Connection and Isolation Panel [NHANES]    Frequency of Communication with Friends and Family: More than three times a week    Frequency of Social Gatherings with Friends and Family: Three times a week    Attends Religious Services: More than 4 times per year    Active Member of Clubs or Organizations: Yes    Attends Banker Meetings: 1 to 4 times per year    Marital Status: Never married    No Known Allergies   Current Outpatient Medications:    Accu-Chek FastClix Lancets MISC, Check blood sugar before meals and after meals, Disp: 102 each, Rfl: 12   acetaminophen (TYLENOL) 500 MG tablet, Take 1,000 mg by mouth every 6 (six) hours as needed for fever., Disp: , Rfl:    Blood Glucose Monitoring Suppl (ACCU-CHEK GUIDE) w/Device KIT, 1 each by Does not apply route 6 (six) times daily., Disp: 1 kit, Rfl: 0   emtricitabine-rilpivir-tenofovir AF (ODEFSEY) 200-25-25 MG TABS tablet, Take 1 tablet by mouth daily with breakfast., Disp: 30 tablet, Rfl: 11   fexofenadine (ALLEGRA) 180 MG tablet, TAKE 1 TABLET(180 MG) BY MOUTH DAILY, Disp: 30 tablet, Rfl: 5   gabapentin (NEURONTIN) 300 MG capsule, TAKE 1 CAPSULE(300 MG) BY MOUTH THREE TIMES DAILY, Disp: 90 capsule, Rfl: 2   glucose blood (ACCU-CHEK GUIDE) test strip, Check blood sugar before meals and after meals, Disp: 100 each, Rfl: 12   insulin glargine (LANTUS) 100 UNIT/ML Solostar Pen, Inject 30 Units into the skin daily., Disp: 15 mL, Rfl: 3   Insulin Pen Needle (PEN NEEDLES) 32G X 4 MM MISC, Use daily as directed., Disp: 200 each, Rfl: 3   liraglutide (VICTOZA) 18 MG/3ML SOPN, Inject 1.8 mg into the skin daily., Disp: 9 mL, Rfl: 2   lisinopril (ZESTRIL) 20 MG tablet, TAKE 1 TABLET(20 MG) BY MOUTH DAILY, Disp: 90 tablet, Rfl: 3   metFORMIN (GLUCOPHAGE-XR) 500 MG 24 hr tablet, Take 2 tablets (1,000 mg total) by mouth 2 (two) times daily with a meal., Disp: 360  tablet, Rfl: 2   rosuvastatin (CRESTOR) 20 MG tablet, TAKE 1 TABLET(20 MG) BY MOUTH DAILY, Disp: 90 tablet, Rfl: 3    Review of Systems     Objective:   Physical Exam        Assessment & Plan:    HIV disease:  I have reviewed Cliffton Asters Baus's labs including viral load which was  Lab Results  Component Value Date   HIV1RNAQUANT  Not Detected 02/14/2023   and cd4 which was  Lab Results  Component Value Date   CD4TABS 458 02/14/2023     I am continuing patient's prescription for [facility] with food  HTN: continue lisinopril   IDDM: he will continue insulin, metformin, victoza  Hyperlipidemia  Lipid Panel     Component Value Date/Time   CHOL 116 02/14/2023 0825   CHOL 161 01/24/2021 0958   TRIG 97 02/14/2023 0825   HDL 30 (L) 02/14/2023 0825   HDL 27 (L) 01/24/2021 0958   CHOLHDL 3.9 02/14/2023 0825   VLDL 18 01/13/2019 0442   LDLCALC 68 02/14/2023 0825   LABVLDL 20 01/24/2021 0958   He will continue with crestor    STI screen:

## 2023-02-22 ENCOUNTER — Ambulatory Visit: Payer: Medicaid Other | Admitting: Infectious Disease

## 2023-02-22 DIAGNOSIS — G4733 Obstructive sleep apnea (adult) (pediatric): Secondary | ICD-10-CM

## 2023-02-22 DIAGNOSIS — Z113 Encounter for screening for infections with a predominantly sexual mode of transmission: Secondary | ICD-10-CM

## 2023-02-22 DIAGNOSIS — A539 Syphilis, unspecified: Secondary | ICD-10-CM

## 2023-02-22 DIAGNOSIS — E1159 Type 2 diabetes mellitus with other circulatory complications: Secondary | ICD-10-CM

## 2023-02-22 DIAGNOSIS — K6282 Dysplasia of anus: Secondary | ICD-10-CM

## 2023-02-22 DIAGNOSIS — B20 Human immunodeficiency virus [HIV] disease: Secondary | ICD-10-CM

## 2023-02-22 DIAGNOSIS — Z794 Long term (current) use of insulin: Secondary | ICD-10-CM

## 2023-02-24 NOTE — Assessment & Plan Note (Signed)
Patient continues to have atypical chest discomfort that is occasionally associated w/ dyspnea but w/o N/V or diaphoresis. In addition this chest discomfort is more of a soreness than the sharp pain previously described.   Unclear etiology. It is possible that this is angina, though it is atypical. He has poorly controlled diabetes and HIV. In addition, a recent CT showed 2V coronary artery disease.   Also considered pulmonary embolism, but believe this is less likely. Patient denies LE swelling. He is not tachycardic and his O2 saturations are 98% this clinic visit.   Low suspicion for dissection or Ptx.  -Plan to continue modification of risk factors  -Referred patient to cardiology for consideration stress testing given his risk.

## 2023-02-24 NOTE — Assessment & Plan Note (Signed)
He did not bring his glucometer this visit. Patient will return in 2 weeks with glucometer and for further titration of his insulin regimen.

## 2023-02-24 NOTE — Progress Notes (Signed)
   CC: chest pain and shortness of breath  HPI:  Wayne Wolfe is a 57 y.o. M with Pmh per below who presents for chest discomfort and dyspnea.    Patient states that about two weeks ago he began having some mild dyspnea. He states that this was most prominent when he was carrying on a conversation. He denies dyspnea with ambulation. He also states that he has had chest discomfort which he states is different from what he described during his previous clinic visit. This chest pain is a soreness in his chest and occurs in both the left and right chest. He denies any radiation of the pain and has no associated N/V. He states that the chest pain is sometimes associated with his dyspnea and sometimes occurs independently. He does not recall any specific provoking or exacerbating factors. He notes that both his dyspnea and chest discomfort are self limiting. He notes that he has also had a mild non productive cough during this time.   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   Physical Exam:  Vitals:   02/20/23 1431  BP: 101/86  Pulse: 93  Resp: (!) 28  Temp: 98.1 F (36.7 C)  TempSrc: Oral   SpO2: 98%  Weight: 236 lb 3.2 oz (107.1 kg)  Height: 5\' 7"  (1.702 m)   Constitutional: Well-developed, well-nourished, and in no distress.  Chest wall: Non TTP, no erythema of overlying skin   Cardiovascular: Normal rate, regular rhythm, intact distal pulses. No gallop and no friction rub.  No murmur heard. No lower extremity edema  Pulmonary: Non labored breathing on room air, no wheezing or rales  Abdominal: Soft. Normal bowel sounds. Non distended and non tender Musculoskeletal: Normal range of motion.     Neurological: Alert and oriented to person, place, and time. Non focal  Skin: Skin is warm and dry.    Assessment & Plan:   See Encounters Tab for problem based charting.  Patient discussed with Dr. Heide Spark

## 2023-02-24 NOTE — Assessment & Plan Note (Signed)
Counseled patient on this. He is currently on GLP1.

## 2023-02-26 NOTE — Progress Notes (Signed)
Internal Medicine Clinic Attending  Case discussed with Dr. Carter  At the time of the visit.  We reviewed the resident's history and exam and pertinent patient test results.  I agree with the assessment, diagnosis, and plan of care documented in the resident's note.  

## 2023-02-28 ENCOUNTER — Encounter: Payer: Self-pay | Admitting: *Deleted

## 2023-03-04 ENCOUNTER — Other Ambulatory Visit (HOSPITAL_COMMUNITY): Payer: Self-pay

## 2023-03-05 ENCOUNTER — Other Ambulatory Visit: Payer: Self-pay | Admitting: Student

## 2023-03-05 ENCOUNTER — Other Ambulatory Visit (HOSPITAL_COMMUNITY): Payer: Self-pay

## 2023-03-06 ENCOUNTER — Encounter: Payer: Medicaid Other | Admitting: Student

## 2023-03-12 ENCOUNTER — Other Ambulatory Visit: Payer: Self-pay

## 2023-03-12 ENCOUNTER — Other Ambulatory Visit (HOSPITAL_COMMUNITY): Payer: Self-pay

## 2023-03-12 DIAGNOSIS — E1159 Type 2 diabetes mellitus with other circulatory complications: Secondary | ICD-10-CM

## 2023-03-12 DIAGNOSIS — I152 Hypertension secondary to endocrine disorders: Secondary | ICD-10-CM

## 2023-03-12 MED ORDER — LISINOPRIL 20 MG PO TABS
20.0000 mg | ORAL_TABLET | Freq: Every day | ORAL | 3 refills | Status: DC
Start: 2023-03-12 — End: 2024-04-07
  Filled 2023-03-12: qty 90, 90d supply, fill #0
  Filled 2023-06-13: qty 90, 90d supply, fill #1
  Filled 2023-09-16: qty 90, 90d supply, fill #2
  Filled 2023-12-16: qty 90, 90d supply, fill #3

## 2023-03-13 ENCOUNTER — Other Ambulatory Visit (HOSPITAL_COMMUNITY): Payer: Self-pay

## 2023-04-12 ENCOUNTER — Other Ambulatory Visit: Payer: Self-pay

## 2023-04-24 ENCOUNTER — Telehealth: Payer: Self-pay

## 2023-04-24 ENCOUNTER — Ambulatory Visit: Payer: Medicaid Other | Admitting: Student

## 2023-04-24 ENCOUNTER — Other Ambulatory Visit (HOSPITAL_COMMUNITY): Payer: Self-pay

## 2023-04-24 VITALS — BP 123/76 | HR 78 | Temp 98.1°F | Wt 237.0 lb

## 2023-04-24 DIAGNOSIS — M25561 Pain in right knee: Secondary | ICD-10-CM | POA: Diagnosis not present

## 2023-04-24 DIAGNOSIS — Z794 Long term (current) use of insulin: Secondary | ICD-10-CM | POA: Diagnosis not present

## 2023-04-24 DIAGNOSIS — Z7984 Long term (current) use of oral hypoglycemic drugs: Secondary | ICD-10-CM

## 2023-04-24 DIAGNOSIS — E119 Type 2 diabetes mellitus without complications: Secondary | ICD-10-CM | POA: Diagnosis present

## 2023-04-24 DIAGNOSIS — E1169 Type 2 diabetes mellitus with other specified complication: Secondary | ICD-10-CM

## 2023-04-24 LAB — POCT GLYCOSYLATED HEMOGLOBIN (HGB A1C): Hemoglobin A1C: 11.3 % — AB (ref 4.0–5.6)

## 2023-04-24 LAB — GLUCOSE, CAPILLARY: Glucose-Capillary: 252 mg/dL — ABNORMAL HIGH (ref 70–99)

## 2023-04-24 MED ORDER — DEXCOM G7 SENSOR MISC
3 refills | Status: DC
Start: 2023-04-24 — End: 2024-07-27
  Filled 2023-04-24: qty 1, 10d supply, fill #0
  Filled 2023-04-24: qty 3, 30d supply, fill #0

## 2023-04-24 MED ORDER — EMPAGLIFLOZIN 10 MG PO TABS
10.0000 mg | ORAL_TABLET | Freq: Every day | ORAL | 0 refills | Status: DC
  Filled 2023-04-24 (×2): qty 90, 90d supply, fill #0

## 2023-04-24 MED ORDER — DICLOFENAC SODIUM 1 % EX GEL
4.0000 g | Freq: Four times a day (QID) | CUTANEOUS | 3 refills | Status: DC
Start: 2023-04-24 — End: 2024-08-06
  Filled 2023-04-24: qty 100, 7d supply, fill #0
  Filled 2023-12-16: qty 100, 7d supply, fill #1
  Filled 2024-04-07 – 2024-04-21 (×2): qty 100, 7d supply, fill #2

## 2023-04-24 NOTE — Telephone Encounter (Signed)
Prior Authorization for patient (Dexcom G7 Sensor) came through on cover my meds was submitted with last office notes and labs awaiting approval or denial.  NWG:[id number]

## 2023-04-24 NOTE — Assessment & Plan Note (Addendum)
Morbid obesity with BMI>35 and associated type 2 DM, HTN and HLD. He has lost about 10 pounds over last year.

## 2023-04-24 NOTE — Telephone Encounter (Signed)
Decision:Approved Gracy Bruins (Key: [id number]) PA Case ID #: [id number] Need Help? Call us at [phone] Outcome Approved today Request Reference Number: [id number]. DEXCOM G7 MIS SENSOR is approved through 10/25/2023. For further questions, call [employer] at [phone]. Authorization Expiration Date: 10/25/2023 Drug Dexcom G7 Sensor ePA cloud logo Form [employer] Electronic Prior Authorization Form [phone] [place]CPDP)

## 2023-04-24 NOTE — Assessment & Plan Note (Signed)
Pt reports two months of pain since hitting knee in kitchen but appears a chronic issue per chart review. His pain comes and goes, is worse with movement and is at times severe 10/10. No trauma, no swelling, no erythema, no abrasions, no fevers. I believe it is arthritic pain. PLAN: Voltaren gel prn and PT refer

## 2023-04-24 NOTE — Telephone Encounter (Signed)
Decision:Approved Wayne Wolfe (Key: Micki Riley) PA Case ID #: [id number] Need Help? Call us at [phone] Outcome Approved today Request Reference Number: [id number]. JARDIANCE TAB 10MG  is approved through 04/23/2024. For further questions, call [employer] at [phone]. Authorization Expiration Date: 04/23/2024 Drug Jardiance 10MG  tablets ePA cloud logo Form [employer] Electronic Prior Authorization Form [phone] NCPDP)

## 2023-04-24 NOTE — Progress Notes (Signed)
CC: DM follow up and knee pain  HPI:  [name] is a 57 y.o. male living with a history stated below and presents today for knee pain and A1C. Please see problem based assessment and plan for additional details.  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   Hypertension 2009   Well controlled on HCTZ   Hypokalemia 01/12/2019   Knee effusion, right 07/19/2017   Lobar pneumonia (HCC) 01/12/2019   Obesity 10/19/2020   Obstructive sleep apnea 10/19/2020   Otitis media 03/13/2017   Polyuria 03/13/2017   Poorly controlled diabetes mellitus (HCC) 03/13/2017   Type 2 diabetes mellitus (HCC)    Well controlled on metformin   Weight loss 03/13/2017    Current Outpatient Medications on File Prior to Visit  Medication Sig Dispense Refill   Accu-Chek FastClix Lancets MISC Check blood sugar before meals and after meals 102 each 12   acetaminophen (TYLENOL) 500 MG tablet Take 1,000 mg by mouth every 6 (six) hours as needed for fever.     Blood Glucose Monitoring Suppl (ACCU-CHEK GUIDE) w/Device KIT 1 each by Does not apply route 6 (six) times daily. 1 kit 0   emtricitabine-rilpivir-tenofovir AF (ODEFSEY) 200-25-25 MG TABS tablet Take 1 tablet by mouth daily with breakfast. 30 tablet 11   fexofenadine (ALLEGRA) 180 MG tablet TAKE 1 TABLET(180 MG) BY MOUTH DAILY 30 tablet 5   gabapentin (NEURONTIN) 300 MG capsule Take 1 capsule (300 mg total) by mouth 3 (three) times daily. 90 capsule 2   glucose blood (ACCU-CHEK GUIDE) test strip Check blood sugar before meals and after meals 100 each 12   insulin glargine (LANTUS) 100 UNIT/ML  Solostar Pen Inject 30 Units into the skin daily. 15 mL 3   Insulin Pen Needle (PEN NEEDLES) 32G X 4 MM MISC Use daily as directed. 200 each 3   liraglutide (VICTOZA) 18 MG/3ML SOPN Inject 1.8 mg into the skin daily. 9 mL 2   lisinopril (ZESTRIL) 20 MG tablet Take 1 tablet (20 mg total) by mouth daily. 90 tablet 3   metFORMIN (GLUCOPHAGE-XR) 500 MG 24 hr tablet Take 2 tablets (1,000 mg total) by mouth 2 (two) times daily with a meal. 360 tablet 2   rosuvastatin (CRESTOR) 20 MG tablet TAKE 1 TABLET(20 MG) BY MOUTH DAILY 90 tablet 3   No current facility-administered medications on file prior to visit.    Family History  Problem Relation Age of Onset   Diabetes Mellitus II Sister    Colon polyps Sister    Diabetes Mellitus II Mother    Coronary artery disease Mother 41       CABGx6   Coronary artery disease Sister 61       CABG   Diabetes Mellitus II Sister    Coronary artery disease Sister    Diabetes Mellitus II Sister    Colon cancer Neg Hx    Esophageal cancer Neg Hx    Rectal cancer Neg Hx    Stomach cancer Neg Hx     Social History   Socioeconomic History   Marital status: Single  Spouse name: Not on file   Number of children: Not on file   Years of education: 13   Highest education level: Not on file  Occupational History    Employer: UNEMPLOYED  Tobacco Use   Smoking status: Former    Current packs/day: 0.00    Types: Cigarettes    Quit date: 2003    Years since quitting: 21.5   Smokeless tobacco: Never  Vaping Use   Vaping status: Never Used  Substance and Sexual Activity   Alcohol use: No    Alcohol/week: 0.0 standard drinks of alcohol    Comment: Last used 2004   Drug use: No    Comment: Last used 2003   Sexual activity: Yes    Partners: Male    Comment: accepted condoms  Other Topics Concern   Not on file  Social History Narrative   On disability. Lives in [place].    Social Determinants of Health   Financial Resource Strain: Medium Risk  (12/25/2022)   Overall Financial Resource Strain (CARDIA)    Difficulty of Paying Living Expenses: Somewhat hard  Food Insecurity: Food Insecurity Present (12/25/2022)   Hunger Vital Sign    Worried About Running Out of Food in the Last Year: Sometimes true    Ran Out of Food in the Last Year: Sometimes true  Transportation Needs: No Transportation Needs (12/25/2022)   PRAPARE - Administrator, Civil Service (Medical): No    Lack of Transportation (Non-Medical): No  Physical Activity: Inactive (12/25/2022)   Exercise Vital Sign    Days of Exercise per Week: 1 day    Minutes of Exercise per Session: 0 min  Stress: Stress Concern Present (12/25/2022)   [employer] of Occupational Health - Occupational Stress Questionnaire    Feeling of Stress : To some extent  Social Connections: Moderately Integrated (12/25/2022)   Social Connection and Isolation Panel [NHANES]    Frequency of Communication with Friends and Family: More than three times a week    Frequency of Social Gatherings with Friends and Family: Three times a week    Attends Religious Services: More than 4 times per year    Active Member of Clubs or Organizations: Yes    Attends Banker Meetings: 1 to 4 times per year    Marital Status: Never married  Intimate Partner Violence: Not At Risk (12/25/2022)   Humiliation, Afraid, Rape, and Kick questionnaire    Fear of Current or Ex-Partner: No    Emotionally Abused: No    Physically Abused: No    Sexually Abused: No    Review of Systems: ROS negative except for what is noted on the assessment and plan.  Vitals:   04/24/23 0830  BP: 123/76  Pulse: 78  Temp: 98.1 F (36.7 C)  TempSrc: Oral  SpO2: 98%  Weight: 237 lb (107.5 kg)    Physical Exam: Constitutional: well-appearing male sitting in chair, in no acute distress HENT: normocephalic atraumatic, mucous membranes moist Eyes: conjunctiva non-erythematous Cardiovascular: regular rate and  rhythm, no m/r/g Pulmonary/Chest: normal work of breathing on room air, lungs clear to auscultation bilaterally Abdominal: soft, non-tender, non-distended MSK: normal bulk and tone, mild tenderness R medial knee without swelling or erythema Neurological: alert & oriented x 3, no focal deficit Skin: warm and dry Psych: normal mood and behavior  Assessment & Plan:   Patient seen with Dr. Heide Spark  Morbid obesity [facility]) Morbid obesity with BMI>35 and associated type 2 DM, HTN and HLD.  He has lost about 10 pounds over last year.  Type 2 diabetes mellitus with other specified complication (HCC) A1C is 11.3, a slight improvement from 11.9 in April. He denies side effects from insulin, metformin, victoza. I wonder if he is missing some of his victoza per refill history, but possible he had access to samples. At any rate, diabetes is not controlled. He is hesitant to wear a CGM as it causes anxiety, but he is agreeable to a two week trial.  PLAN: Start Jardiance 10mg  Continue metformin 1000BID, Victoza 1.8, Lantus 30 u every day Refer to Nutrition, encourage weight loss, Dexcom sensor ACR and foot exam today  Knee pain Pt reports two months of pain since hitting knee in kitchen but appears a chronic issue per chart review. His pain comes and goes, is worse with movement and is at times severe 10/10. No trauma, no swelling, no erythema, no abrasions, no fevers. I believe it is arthritic pain. PLAN: Voltaren gel prn and PT refer  Katheran James, D.O. [facility] Internal Medicine, PGY-1 Phone: [phone] Date 04/24/2023 Time 10:06 AM

## 2023-04-24 NOTE — Patient Instructions (Signed)
Mr Faro,  Thank you for coming in today. This after visit summary is an important review of tests, referrals, and medication changes that were discussed during your visit. If you have questions or concerns, call the clinic at [phone] between 9am - 4pm. Outside of clinic business hours, call the main hospital at [phone] and ask the operator for the on-call internal medicine resident.   Best, Dr. Katheran James

## 2023-04-24 NOTE — Assessment & Plan Note (Addendum)
A1C is 11.3, a slight improvement from 11.9 in April. He denies side effects from insulin, metformin, victoza. I wonder if he is missing some of his victoza per refill history, but possible he had access to samples. At any rate, diabetes is not controlled. He is hesitant to wear a CGM as it causes anxiety, but he is agreeable to a two week trial.  PLAN: Start Jardiance 10mg  Continue metformin 1000BID, Victoza 1.8, Lantus 30 u every day Refer to Nutrition, encourage weight loss, Dexcom sensor ACR and foot exam today

## 2023-04-24 NOTE — Telephone Encounter (Signed)
Prior Authorization for patient Wayne Wolfe) came through on cover my meds wa submitted with last office notes and labs awaiting approval or denial.  TDD:[id number]

## 2023-04-25 ENCOUNTER — Other Ambulatory Visit (HOSPITAL_COMMUNITY): Payer: Self-pay

## 2023-04-25 LAB — MICROALBUMIN / CREATININE URINE RATIO
Microalb/Creat Ratio: 6 mg/g creat (ref 0–29)
Microalbumin, Urine: 8.2 ug/mL

## 2023-04-26 NOTE — Progress Notes (Signed)
Internal Medicine Clinic Attending  I was physically present during the key portions of the resident provided service and participated in the medical decision making of patient's management care. I reviewed pertinent patient test results.  The assessment, diagnosis, and plan were formulated together and I agree with the documentation in the resident's note.  Narendra, Nischal, MD  

## 2023-04-29 ENCOUNTER — Other Ambulatory Visit (HOSPITAL_COMMUNITY): Payer: Self-pay

## 2023-04-29 ENCOUNTER — Telehealth: Payer: Self-pay

## 2023-04-29 ENCOUNTER — Encounter: Payer: Self-pay | Admitting: Cardiovascular Disease

## 2023-04-29 ENCOUNTER — Ambulatory Visit: Payer: Medicaid Other | Admitting: Cardiovascular Disease

## 2023-04-29 VITALS — BP 112/78 | HR 95 | Ht 67.0 in | Wt 232.8 lb

## 2023-04-29 DIAGNOSIS — E1159 Type 2 diabetes mellitus with other circulatory complications: Secondary | ICD-10-CM | POA: Diagnosis not present

## 2023-04-29 DIAGNOSIS — R9431 Abnormal electrocardiogram [ECG] [EKG]: Secondary | ICD-10-CM

## 2023-04-29 DIAGNOSIS — Z794 Long term (current) use of insulin: Secondary | ICD-10-CM

## 2023-04-29 DIAGNOSIS — Z01810 Encounter for preprocedural cardiovascular examination: Secondary | ICD-10-CM | POA: Diagnosis not present

## 2023-04-29 DIAGNOSIS — I2089 Other forms of angina pectoris: Secondary | ICD-10-CM | POA: Diagnosis not present

## 2023-04-29 DIAGNOSIS — I152 Hypertension secondary to endocrine disorders: Secondary | ICD-10-CM | POA: Diagnosis not present

## 2023-04-29 MED ORDER — METOPROLOL TARTRATE 100 MG PO TABS
ORAL_TABLET | ORAL | 0 refills | Status: DC
  Filled 2023-04-29: qty 1, 1d supply, fill #0

## 2023-04-29 NOTE — Progress Notes (Signed)
   04/29/2023  Patient ID: Wayne Wolfe, male   DOB: 07/18/1966, 57 y.o.   MRN: [id number]  Outreach attempt to discuss diabetes management.  Patient previously scheduled with Lynnda Shields, PharmD after being identified as being new to managed Medicaid with a  diabetes diagnosis.  She was unable to contact patient for scheduled appointment; I was also not about to reach Mr. Payant.  I did leave a HIPAA compliant voicemail with my direct number for the patient to return my call.  Lenna Gilford, PharmD, DPLA

## 2023-04-29 NOTE — Patient Instructions (Addendum)
Medication Instructions:  Your physician recommends that you continue on your current medications as directed. Please refer to the Current Medication list given to you today.  *If you need a refill on your cardiac medications before your next appointment, please call your pharmacy*  Lab Work: BMET today If you have labs (blood work) drawn today and your tests are completely normal, you will receive your results only by: MyChart Message (if you have MyChart) OR A paper copy in the mail If you have any lab test that is abnormal or we need to change your treatment, we will call you to review the results.  Testing/Procedures: Coronary CT Angiogram Your physician has requested that you have cardiac CT. Cardiac computed tomography (CT) is a painless test that uses an x-ray machine to take clear, detailed pictures of your heart. For further information please visit [email or web address] Please follow instruction sheet as given.  Follow-Up: At [facility], you and your health needs are our priority.  As part of our continuing mission to provide you with exceptional heart care, we have created designated Provider Care Teams.  These Care Teams include your primary Cardiologist (physician) and Advanced Practice Providers (APPs -  Physician Assistants and Nurse Practitioners) who all work together to provide you with the care you need, when you need it.  We recommend signing up for the patient portal called "MyChart".  Sign up information is provided on this After Visit Summary.  MyChart is used to connect with patients for Virtual Visits (Telemedicine).  Patients are able to view lab/test results, encounter notes, upcoming appointments, etc.  Non-urgent messages can be sent to your provider as well.   To learn more about what you can do with MyChart, go to [email or web address].    Your next appointment:   3 month(s)  Provider:   Kristeen Miss, MD    Other Instructions   Your cardiac  CT will be scheduled at:   [facility] [address] [place], [place] [address] [phone]  Please arrive at the [facility] and Children's Entrance (Entrance C2) of [facility] 30 minutes prior to test start time. You can use the FREE valet parking offered at entrance C (encouraged to control the heart rate for the test)  Proceed to the [facility] Radiology Department (first floor) to check-in and test prep.  All radiology patients and guests should use entrance C2 at [facility], accessed from [facility], even though the hospital's physical address listed is [address]     Please follow these instructions carefully (unless otherwise directed):  An IV will be required for this test and Nitroglycerin will be given.  Hold all erectile dysfunction medications at least 3 days (72 hrs) prior to test. (Ie viagra, cialis, sildenafil, tadalafil, etc)   On the Night Before the Test: Be sure to Drink plenty of water. Do not consume any caffeinated/decaffeinated beverages or chocolate 12 hours prior to your test. Do not take any antihistamines 12 hours prior to your test.  On the Day of the Test: Drink plenty of water until 1 hour prior to the test. Do not eat any food 1 hour prior to test. You may take your regular medications prior to the test.  Take metoprolol (Lopressor) two hours prior to test. If you take Furosemide/Hydrochlorothiazide/Spironolactone, please HOLD on the morning of the test.      After the Test: Drink plenty of water. After receiving IV contrast, you may experience a mild  flushed feeling. This is normal. On occasion, you may experience a mild rash up to 24 hours after the test. This is not dangerous. If this occurs, you can take Benadryl 25 mg and increase your fluid intake. If you experience trouble breathing, this can be serious. If it is severe call 911 IMMEDIATELY. If it is mild, please call our office. If  you take any of these medications: Glipizide/Metformin, Avandament, Glucavance, please do not take 48 hours after completing test unless otherwise instructed.  We will call to schedule your test 2-4 weeks out understanding that some insurance companies will need an authorization prior to the service being performed.   For more information and frequently asked questions, please visit our website : [email or web address]  For non-scheduling related questions, please contact the cardiac imaging nurse navigator should you have any questions/concerns: Cardiac Imaging Nurse Navigators Direct Office Dial: [phone]   For scheduling needs, including cancellations and rescheduling, please call [place], [phone].

## 2023-04-29 NOTE — Progress Notes (Signed)
  Cardiology Office Note:  .   Date:  04/29/2023  ID:  Wayne Wolfe, DOB [birth year], MRN [id number] PCP: Marrianne Mood, MD  [facility] Providers Cardiologist:  Kodiak Rollyson  Click to update primary MD,subspecialty MD or APP then REFRESH:1}   History of Present Illness: . April 29, 2023   Wayne Wolfe is a 57 y.o. male who we are seeing today for episodes of chest pain.  He has a history of diabetes mellitus, hypertension, hyperlipidemia.  Has occasional episodes of CP  Occasional dyspnea   Lung CT ( to follow up for a lung nodule) showed Coronary artery calcification    CP occurs at rest and also while walking   CP will last for several minutes.   Eats an unrestricted diet HbA1c is 11.3  Regularly eats processed    Does not get regular exercise  Is not working currently  Worked at [employer], Is working on getting another job  Has DOE   ROS:   Studies Reviewed: Marland Kitchen        EKG Interpretation Date/Time:  Monday April 29 2023 09:55:55 EDT Ventricular Rate:  88 PR Interval:  192 QRS Duration:  76 QT Interval:  332 QTC Calculation: 401 R Axis:   39  Text Interpretation: Normal sinus rhythm T wave abnormality, consider inferolateral ischemia When compared with ECG of 25-Dec-2022 10:05, T wave inversion now evident in Inferior leads T wave inversion now evident in Lateral leads The Inferior and lateral TWI are new from his most recent ECG from December 25, 2022 but were seen on a previous ECG on December 22, 2017 . Confirmed by Wayne Wolfe [phone]) on 04/29/2023 10:23:35 AM   Risk Assessment/Calculations:            Physical Exam:   VS:  BP 112/78   Pulse 95   Ht 5\' 7"  (1.702 m)   Wt 232 lb 12.8 oz (105.6 kg)   SpO2 96%   BMI 36.46 kg/m    Wt Readings from Last 3 Encounters:  04/29/23 232 lb 12.8 oz (105.6 kg)  04/24/23 237 lb (107.5 kg)  02/20/23 236 lb 3.2 oz (107.1 kg)    GEN: moderately obese , middle age man , in no acute distress NECK: No JVD; No carotid  bruits CARDIAC: RRR, no murmurs, rubs, gallops RESPIRATORY:  Clear to auscultation without rales, wheezing or rhonchi  ABDOMEN: Soft, non-tender, non-distended EXTREMITIES:  No edema; No deformity   ASSESSMENT AND PLAN: .    1.  Chest pain: [name] presents for further evaluation of chest pain.  He has very poorly controlled diabetes mellitus.  His EKG today shows some T wave inversions that are new from March 2024 although be seen/similar changes were seen in a previous EKG in 2019.  I like to schedule him for a coronary CT angiogram.  Will see him again in 3 months for follow-up visit.  We discussed the possibility that he may need a heart catheterization and we will call him and discussed heart catheterization if that is the case.  2.  Poorly controlled diabetes mellitus: His hemoglobin A1c is 11.3.  I encouraged him to reduce the foods that are white, wheat, sweet.  I have advised him to exercise regularly.    I think he would benefit from getting back into the workforce.  Follow up visit in 3 months        Dispo:    Signed, Wayne Miss, MD

## 2023-05-03 ENCOUNTER — Telehealth (HOSPITAL_COMMUNITY): Payer: Self-pay | Admitting: *Deleted

## 2023-05-03 NOTE — Telephone Encounter (Signed)
Reaching out to patient to offer assistance regarding upcoming cardiac imaging study; pt verbalizes understanding of appt date/time, parking situation and where to check in, pre-test NPO status and medications ordered, and verified current allergies; name and call back number provided for further questions should they arise  Larey Brick RN Navigator Cardiac Imaging [facility] [phone] office [phone] cell  Patient to take 100mg  metoprolol tartrate two hours prior to his cardiac CT scan.  He is aware to arrive at 9:30 AM.

## 2023-05-06 ENCOUNTER — Ambulatory Visit (HOSPITAL_COMMUNITY)
Admission: RE | Admit: 2023-05-06 | Discharge: 2023-05-06 | Disposition: A | Discharge status: Home / Self Care | Payer: Medicaid Other | Source: Ambulatory Visit | Attending: Cardiovascular Disease | Admitting: Cardiovascular Disease

## 2023-05-06 DIAGNOSIS — I25118 Atherosclerotic heart disease of native coronary artery with other forms of angina pectoris: Secondary | ICD-10-CM | POA: Diagnosis not present

## 2023-05-06 DIAGNOSIS — R9431 Abnormal electrocardiogram [ECG] [EKG]: Secondary | ICD-10-CM | POA: Insufficient documentation

## 2023-05-06 DIAGNOSIS — E1159 Type 2 diabetes mellitus with other circulatory complications: Secondary | ICD-10-CM | POA: Diagnosis not present

## 2023-05-06 DIAGNOSIS — I2089 Other forms of angina pectoris: Secondary | ICD-10-CM | POA: Insufficient documentation

## 2023-05-06 DIAGNOSIS — Z01810 Encounter for preprocedural cardiovascular examination: Secondary | ICD-10-CM | POA: Diagnosis present

## 2023-05-06 DIAGNOSIS — I152 Hypertension secondary to endocrine disorders: Secondary | ICD-10-CM | POA: Insufficient documentation

## 2023-05-06 MED ORDER — NITROGLYCERIN 0.4 MG SL SUBL
0.8000 mg | SUBLINGUAL_TABLET | SUBLINGUAL | Status: DC | PRN
  Administered 2023-05-06: 0.8 mg via SUBLINGUAL

## 2023-05-06 MED ORDER — IOHEXOL 350 MG/ML SOLN
100.0000 mL | Freq: Once | INTRAVENOUS | Status: AC | PRN
  Administered 2023-05-06: 100 mL via INTRAVENOUS

## 2023-05-06 MED ORDER — NITROGLYCERIN 0.4 MG SL SUBL
SUBLINGUAL_TABLET | SUBLINGUAL | Status: AC
  Filled 2023-05-06: qty 2

## 2023-05-07 ENCOUNTER — Other Ambulatory Visit (HOSPITAL_COMMUNITY): Payer: Self-pay

## 2023-05-13 ENCOUNTER — Telehealth: Payer: Self-pay | Admitting: Cardiovascular Disease

## 2023-05-13 NOTE — Telephone Encounter (Signed)
Returned call to Diane with [facility] and confirmed that we do see that the patient has a 5 mm posterior right lower lobe pulmonary nodule.   Prior CT in chart from 11/30/21 shows: 1. Continued stability of 0.6 cm right lower lobe pulmonary nodule, considered benign. No active pulmonary disease.

## 2023-05-13 NOTE — Telephone Encounter (Signed)
Diane from [facility] is calling to make sure our providers saw the addendum created on the patient's CT results. Please advise.

## 2023-05-20 ENCOUNTER — Ambulatory Visit: Payer: Medicaid Other | Admitting: Physical Therapy

## 2023-05-28 NOTE — Therapy (Unsigned)
OUTPATIENT PHYSICAL THERAPY LOWER EXTREMITY EVALUATION   Patient Name: Wayne Wolfe MRN: [id number] DOB:Mar 22, 1966, 57 y.o., male Today's Date: 05/29/2023  END OF SESSION:  PT End of Session - 05/29/23 1351     Visit Number 1    Number of Visits 12    Date for PT Re-Evaluation 07/10/23    Authorization Type M[employer]    PT Start Time 0845    PT Stop Time 0930    PT Time Calculation (min) 45 min    Activity Tolerance Patient tolerated treatment well    Behavior During Therapy [facility] for tasks assessed/performed             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   Hypertension 2009   Well controlled on HCTZ   Hypokalemia 01/12/2019   Knee effusion, right 07/19/2017   Lobar pneumonia (HCC) 01/12/2019   Obesity 10/19/2020   Obstructive sleep apnea 10/19/2020   Otitis media 03/13/2017   Polyuria 03/13/2017   Poorly controlled diabetes mellitus (HCC) 03/13/2017   Type 2 diabetes mellitus (HCC)    Well controlled on metformin   Weight loss 03/13/2017   Past Surgical History:  Procedure Laterality Date   CARPAL TUNNEL RELEASE Right 1990s   none     Patient Active Problem List   Diagnosis Date Noted   Chest pain 12/25/2022   Knee pain 06/20/2022   Environmental allergies 02/16/2022   Healthcare maintenance 12/04/2021   Obstructive sleep apnea 10/19/2020   Morbid obesity (HCC) 10/19/2020   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   Multiple joint pain 02/11/2020   Syphilis 12/14/2019   Unprotected sexual intercourse 12/01/2019   Pulmonary nodule 12/26/2017   Routine screening for STI (sexually transmitted infection)  10/16/2017   Otitis media 03/13/2017   Polyuria 03/13/2017   Colon cancer screening 02/24/2016   Testicular pain 01/20/2016   Numbness and tingling of both feet 08/26/2013   Keloid of skin 06/25/2012   Type 2 diabetes mellitus with other specified complication (HCC) 03/12/2011   Hyperlipidemia associated with type 2 diabetes mellitus (HCC) 03/12/2011   Hypertension associated with diabetes (HCC) 04/20/2008   Human immunodeficiency virus (HIV) disease (HCC) 04/15/2008    PCP: Earl Lagos, MD  REFERRING PROVIDER: Earl Lagos, MD  REFERRING DIAG: M25.561 (ICD-10-CM) - Acute pain of right knee  THERAPY DIAG:  Chronic pain of right knee  Muscle weakness (generalized)  Rationale for Evaluation and Treatment: Rehabilitation  ONSET DATE: 3-6 mos   SUBJECTIVE:   SUBJECTIVE STATEMENT: Patient reported pain in Rt knee for 6 months.  He has pain with walking, stiffness with transfers and climbing stairs.  He endorses some weakness and some crepitus. He recalls a time when he hit his knee on something and thinks maybe that made it worse.  He has not had any imaging.   PERTINENT HISTORY: 6 mos + PAIN:  Are you having pain? Yes: NPRS scale: 6/10 Pain location: Rt medial  Pain description: sharp stabbing burn  Aggravating factors: walking, bending Relieving factors: ice, tylenol   PRECAUTIONS: Other: none   RED FLAGS: None   WEIGHT BEARING RESTRICTIONS:  No  FALLS:  Has patient fallen in last 6 months? No  LIVING ENVIRONMENT: Lives with: lives alone Lives in: House/apartment Stairs: Yes: External: 3 steps; on right going up Has following equipment at home: None  OCCUPATION: Not working , has had multiple jobs in various fields   PLOF: Independent, family and friends, sports events, shopping  PATIENT GOALS: I want to be able to move better.  Get rid of this pain.   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 45.5 cm, Lt. 42 cm   MUSCLE LENGTH: Tightness in hips ER and IR   POSTURE:  genu varum   PALPATION: Pain medial joint line in Rt LE  LOWER EXTREMITY ROM:  Active ROM Right eval Left eval  Hip flexion    Hip extension    Hip abduction    Hip adduction    Hip internal rotation    Hip external rotation    Knee flexion 116 124  Knee extension 0 0  Ankle dorsiflexion    Ankle plantarflexion    Ankle inversion    Ankle eversion     (Blank rows = not tested)  LOWER EXTREMITY MMT:  MMT Right eval Left eval  Hip flexion 4- 4  Hip extension    Hip abduction 3- 3+  Hip adduction    Hip internal rotation    Hip external rotation    Knee flexion 4+ 5  Knee extension 4+ pain  5  Ankle dorsiflexion    Ankle plantarflexion    Ankle inversion    Ankle eversion     (Blank rows = not tested)  LOWER EXTREMITY SPECIAL TESTS:  Knee neg for stability testing   FUNCTIONAL TESTS:  5 times sit to stand: 23 sec   SLS limited to < 5 sec on Rt and Lt.  GAIT: Distance walked: 100 Assistive device utilized: None Level of assistance: Complete Independence Comments: normal    TODAY'S TREATMENT:                                                                                                                              DATE: 05/29/23    PATIENT EDUCATION:  Education details: POC, HEP and support of knee with improved hip mobility and strength  Person educated: Patient Education method: Explanation Education comprehension: verbalized understanding and returned demonstration  HOME EXERCISE PROGRAM: Access Code: [email or web address] URL: [email or web address] Date: 05/29/2023 Prepared by: Karie Mainland  Exercises - Supine Hamstring Stretch with Strap  - 1 x daily - 7 x weekly - 1 sets - 5 reps - 30 hold - Hooklying Single  Knee to Chest Stretch  - 1 x daily - 7 x weekly - 1 sets - 5 reps - 30  hold - Supine Lower Trunk Rotation  - 1 x daily - 7 x weekly - 2 sets - 10 reps - 10 hold - Small Range Straight Leg Raise  - 1 x daily - 7 x weekly - 2 sets - 10 reps - 5 hold   ASSESSMENT:  CLINICAL IMPRESSION: Patient is a 57 y.o. male who was seen today for physical therapy evaluation and treatment for Rt knee pain.   OBJECTIVE IMPAIRMENTS: decreased mobility, difficulty walking, decreased ROM, decreased strength, increased edema, increased fascial restrictions, impaired flexibility, postural dysfunction, obesity, and pain.   ACTIVITY LIMITATIONS: carrying, lifting, bending, standing, squatting, sleeping, stairs, transfers, and locomotion level  PARTICIPATION LIMITATIONS: shopping and community activity  PERSONAL FACTORS: Fitness and 1 comorbidity: diabetes  are also affecting patient's functional outcome.   REHAB POTENTIAL: Excellent  CLINICAL DECISION MAKING: Stable/uncomplicated  EVALUATION COMPLEXITY: Low   GOALS:   LONG TERM GOALS: Target date: 07/10/2023    Pt will be independent with home exercise program for lower extremity strength Baseline: Unknown Goal status: INITIAL  2.  Patient will increase hip abduction strength to 4 out of 5 bilaterally in order to support knees with functional activity Baseline: 3-/5 to 3+/5 Goal status: INITIAL  3.  Patient will perform 5 x STS in 15 sec or less Baseline: 23 sec Goal status: INITIAL  4.  Patient will be able to walk in the community, shopping with pain no more than 4/10 Baseline: 6/10-7/10 avg. Goal status: INITIAL  5.  Patient will be able to report being able to use the stairs in the community without > 4/10 pain  Baseline: 6/10/ to 7/10 Goal status: INITIAL  6.  LEFS score will increase  by 10 or more points indicating improved functional mobility   Baseline: 13/80 16% able  Goal status: INITIAL   PLAN:  PT FREQUENCY: 2x/week  PT  DURATION: 6 weeks  PLANNED INTERVENTIONS: Therapeutic exercises, Therapeutic activity, Neuromuscular re-education, Balance training, Gait training, Patient/Family education, Self Care, Joint mobilization, Dry Needling, Taping, Ionotophoresis 4mg /ml Dexamethasone, Manual therapy, and Re-evaluation  PLAN FOR NEXT SESSION: NuStep, HEP, gait and functional testing , hip strength    Danashia Landers, PT 05/29/2023, 1:53 PM   Karie Mainland, PT 05/29/23 3:41 PM Phone: [phone] Fax: [phone]

## 2023-05-29 ENCOUNTER — Ambulatory Visit: Payer: Medicaid Other | Attending: Internal Medicine | Admitting: Physical Therapy

## 2023-05-29 ENCOUNTER — Telehealth: Payer: Self-pay | Admitting: Pharmacist

## 2023-05-29 DIAGNOSIS — M6281 Muscle weakness (generalized): Secondary | ICD-10-CM | POA: Diagnosis present

## 2023-05-29 DIAGNOSIS — M25561 Pain in right knee: Secondary | ICD-10-CM | POA: Insufficient documentation

## 2023-05-29 DIAGNOSIS — G8929 Other chronic pain: Secondary | ICD-10-CM | POA: Insufficient documentation

## 2023-05-29 NOTE — Progress Notes (Signed)
Patient appearing on report for newly acquired Managed Medicaid and elevated A1c.  Outreached patient to discuss glucose control and medication management. Left voicemail for patient to return my call at their convenience.   Lynnda Shields, PharmD, BCPS Clinical Pharmacist [facility]

## 2023-06-07 ENCOUNTER — Encounter: Payer: Medicaid Other | Admitting: Student

## 2023-06-07 NOTE — Progress Notes (Deleted)
CC: ***  HPI:  [name] is a 57 y.o. male with a past medical history of hypertension, type 2 diabetes, hyperlipidemia presenting for acute visit patient has concerns for sinus infection.  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   Hypertension 2009   Well controlled on HCTZ   Hypokalemia 01/12/2019   Knee effusion, right 07/19/2017   Lobar pneumonia (HCC) 01/12/2019   Obesity 10/19/2020   Obstructive sleep apnea 10/19/2020   Otitis media 03/13/2017   Polyuria 03/13/2017   Poorly controlled diabetes mellitus (HCC) 03/13/2017   Type 2 diabetes mellitus (HCC)    Well controlled on metformin   Weight loss 03/13/2017     Current Outpatient Medications:    Accu-Chek FastClix Lancets MISC, Check blood sugar before meals and after meals, Disp: 102 each, Rfl: 12   acetaminophen (TYLENOL) 500 MG tablet, Take 1,000 mg by mouth every 6 (six) hours as needed for fever., Disp: , Rfl:    Blood Glucose Monitoring Suppl (ACCU-CHEK GUIDE) w/Device KIT, 1 each by Does not apply route 6 (six) times daily., Disp: 1 kit, Rfl: 0   Continuous Glucose Sensor (DEXCOM G7 SENSOR) MISC, Change sensor every 10 days or as directed by your provider (for diabetes)., Disp: 3 each, Rfl: 3   diclofenac Sodium (VOLTAREN) 1 % GEL, Apply 4 grams topically 4 (four) times daily as needed, Disp: 100 g, Rfl: 3   empagliflozin (JARDIANCE) 10 MG TABS tablet, Take 1 tablet (10 mg total) by mouth daily before breakfast., Disp: 90 tablet, Rfl: 0   emtricitabine-rilpivir-tenofovir AF (ODEFSEY) 200-25-25 MG TABS tablet, Take 1 tablet by mouth daily with  breakfast., Disp: 30 tablet, Rfl: 11   fexofenadine (ALLEGRA) 180 MG tablet, TAKE 1 TABLET(180 MG) BY MOUTH DAILY, Disp: 30 tablet, Rfl: 5   gabapentin (NEURONTIN) 300 MG capsule, Take 1 capsule (300 mg total) by mouth 3 (three) times daily., Disp: 90 capsule, Rfl: 2   glucose blood (ACCU-CHEK GUIDE) test strip, Check blood sugar before meals and after meals, Disp: 100 each, Rfl: 12   insulin glargine (LANTUS) 100 UNIT/ML Solostar Pen, Inject 30 Units into the skin daily., Disp: 15 mL, Rfl: 3   Insulin Pen Needle (PEN NEEDLES) 32G X 4 MM MISC, Use daily as directed., Disp: 200 each, Rfl: 3   liraglutide (VICTOZA) 18 MG/3ML SOPN, Inject 1.8 mg into the skin daily., Disp: 9 mL, Rfl: 2   lisinopril (ZESTRIL) 20 MG tablet, Take 1 tablet (20 mg total) by mouth daily., Disp: 90 tablet, Rfl: 3   metFORMIN (GLUCOPHAGE-XR) 500 MG 24 hr tablet, Take 2 tablets (1,000 mg total) by mouth 2 (two) times daily with a meal., Disp: 360 tablet, Rfl: 2   metoprolol tartrate (LOPRESSOR) 100 MG tablet, Take 1 tablet by mouth two hours prior to scan, Disp: 1 tablet, Rfl: 0   rosuvastatin (CRESTOR) 20 MG tablet, TAKE 1 TABLET(20 MG) BY MOUTH DAILY, Disp: 90 tablet, Rfl: 3  Review of Systems:  ***  Constitutional: Eye: Respiratory: Cardiovascular: GI: MSK: GU: Skin: Neuro: Endocrine:   Physical Exam:  There were no vitals filed  for this visit. *** General: Patient is sitting comfortably in the room  Eyes: Pupils equal and reactive to light, EOM intact  Head: Normocephalic, atraumatic  Neck: Supple, nontender, full range of motion, No JVD Cardio: Regular rate and rhythm, no murmurs, rubs or gallops. 2+ pulses to bilateral upper and lower extremities  Chest: No chest tenderness Pulmonary: Clear to ausculation bilaterally with no rales, rhonchi, and crackles  Abdomen: Soft, nontender with normoactive bowel sounds with no rebound or guarding  Neuro: Alert and orientated x3. CN II-XII intact. Sensation intact  to upper and lower extremities. 2+ patellar reflex.  Back: No midline tenderness, no step off or deformities noted. No paraspinal muscle tenderness.  Skin: No rashes noted  MSK: 5/5 strength to upper and lower extremities.    Assessment & Plan:   No problem-specific Assessment & Plan notes found for this encounter.    Patient {GC/GE:[id number]::"discussed with","seen with"} Dr. {NAMES:[id number]::"Guilloud","Hoffman","Mullen","Narendra","Williams","Vincent"}  Modena Slater, DO PGY-2 Internal Medicine Resident  Pager: [phone]

## 2023-06-10 ENCOUNTER — Other Ambulatory Visit: Payer: Self-pay

## 2023-06-10 DIAGNOSIS — B20 Human immunodeficiency virus [HIV] disease: Secondary | ICD-10-CM

## 2023-06-10 DIAGNOSIS — Z113 Encounter for screening for infections with a predominantly sexual mode of transmission: Secondary | ICD-10-CM

## 2023-06-11 ENCOUNTER — Other Ambulatory Visit: Payer: Medicaid Other

## 2023-06-12 ENCOUNTER — Other Ambulatory Visit: Payer: Medicaid Other

## 2023-06-12 ENCOUNTER — Other Ambulatory Visit: Payer: Self-pay

## 2023-06-12 ENCOUNTER — Other Ambulatory Visit (HOSPITAL_COMMUNITY)
Admission: RE | Admit: 2023-06-12 | Discharge: 2023-06-12 | Disposition: A | Discharge status: Home / Self Care | Payer: Medicaid Other | Source: Ambulatory Visit | Attending: Infectious Disease | Admitting: Infectious Disease

## 2023-06-12 DIAGNOSIS — Z113 Encounter for screening for infections with a predominantly sexual mode of transmission: Secondary | ICD-10-CM | POA: Diagnosis present

## 2023-06-12 DIAGNOSIS — B20 Human immunodeficiency virus [HIV] disease: Secondary | ICD-10-CM | POA: Insufficient documentation

## 2023-06-13 LAB — URINE CYTOLOGY ANCILLARY ONLY
Chlamydia: NEGATIVE
Comment: NEGATIVE
Comment: NORMAL
Neisseria Gonorrhea: NEGATIVE

## 2023-06-13 LAB — T-HELPER CELL (CD4) - (RCID CLINIC ONLY)
CD4 % Helper T Cell: 24 % — ABNORMAL LOW (ref 33–65)
CD4 T Cell Abs: 526 /uL (ref 400–1790)

## 2023-06-14 ENCOUNTER — Ambulatory Visit: Payer: Medicaid Other | Attending: Internal Medicine | Admitting: Physical Therapy

## 2023-06-14 ENCOUNTER — Other Ambulatory Visit (HOSPITAL_COMMUNITY): Payer: Self-pay

## 2023-06-14 ENCOUNTER — Telehealth: Payer: Self-pay | Admitting: Physical Therapy

## 2023-06-14 LAB — COMPLETE METABOLIC PANEL WITH GFR
AG Ratio: 1.5 (calc) (ref 1.0–2.5)
ALT: 15 U/L (ref 9–46)
AST: 10 U/L (ref 10–35)
Albumin: 4.2 g/dL (ref 3.6–5.1)
Alkaline phosphatase (APISO): 62 U/L (ref 35–144)
BUN: 12 mg/dL (ref 7–25)
CO2: 29 mmol/L (ref 20–32)
Calcium: 9.5 mg/dL (ref 8.6–10.3)
Chloride: 101 mmol/L (ref 98–110)
Creat: 1.1 mg/dL (ref 0.70–1.30)
Globulin: 2.8 g/dL (ref 1.9–3.7)
Glucose, Bld: 207 mg/dL — ABNORMAL HIGH (ref 65–99)
Potassium: 4.2 mmol/L (ref 3.5–5.3)
Sodium: 138 mmol/L (ref 135–146)
Total Bilirubin: 0.5 mg/dL (ref 0.2–1.2)
Total Protein: 7 g/dL (ref 6.1–8.1)
eGFR: 78 mL/min/{1.73_m2} (ref >=60)

## 2023-06-14 LAB — CBC WITH DIFFERENTIAL/PLATELET
Absolute Monocytes: 510 {cells}/uL (ref 200–950)
Basophils Absolute: 69 {cells}/uL (ref 0–200)
Basophils Relative: 1.4 %
Eosinophils Absolute: 123 {cells}/uL (ref 15–500)
Eosinophils Relative: 2.5 %
HCT: 48.7 % (ref 38.5–50.0)
Hemoglobin: 16.1 g/dL (ref 13.2–17.1)
Lymphs Abs: 2445 {cells}/uL (ref 850–3900)
MCH: 29.7 pg (ref 27.0–33.0)
MCHC: 33.1 g/dL (ref 32.0–36.0)
MCV: 89.9 fL (ref 80.0–100.0)
MPV: 9.6 fL (ref 7.5–12.5)
Monocytes Relative: 10.4 %
Neutro Abs: 1754 {cells}/uL (ref 1500–7800)
Neutrophils Relative %: 35.8 %
Platelets: 269 10*3/uL (ref 140–400)
RBC: 5.42 10*6/uL (ref 4.20–5.80)
RDW: 13.1 % (ref 11.0–15.0)
Total Lymphocyte: 49.9 %
WBC: 4.9 10*3/uL (ref 3.8–10.8)

## 2023-06-14 LAB — HIV-1 RNA QUANT-NO REFLEX-BLD
HIV 1 RNA Quant: NOT DETECTED {copies}/mL
HIV-1 RNA Quant, Log: NOT DETECTED {Log_copies}/mL

## 2023-06-14 LAB — T PALLIDUM AB: T Pallidum Abs: POSITIVE — AB

## 2023-06-14 LAB — RPR: RPR Ser Ql: REACTIVE — AB

## 2023-06-14 LAB — RPR TITER: RPR Titer: 1:1 {titer} — ABNORMAL HIGH

## 2023-06-14 NOTE — Telephone Encounter (Signed)
Left  voicemail for patient regarding no show to appointment. Left reminder of upcoming appointment date/ time as well as a reminder of the clinic attendance policy.

## 2023-06-19 ENCOUNTER — Ambulatory Visit: Payer: Medicaid Other | Admitting: Physical Therapy

## 2023-06-19 ENCOUNTER — Telehealth: Payer: Self-pay | Admitting: Physical Therapy

## 2023-06-19 NOTE — Telephone Encounter (Signed)
Patient no show for his appt today, 06/19/23.  Called patient to follow up to notify of the attendance policy.  He has 1 more appt Friday.  If he fails to no show he will be discharged.  If he attends he will need to make 1 appt at a time going forward.  Provided contact info for our clinic if needed for follow up.   Karie Mainland, PT 06/19/23 9:56 AM Phone: [phone] Fax: [phone]

## 2023-06-21 ENCOUNTER — Ambulatory Visit: Payer: Medicaid Other | Admitting: Physical Therapy

## 2023-06-25 ENCOUNTER — Ambulatory Visit: Payer: Medicaid Other | Admitting: Physical Therapy

## 2023-06-25 ENCOUNTER — Ambulatory Visit: Payer: Medicaid Other | Admitting: Infectious Disease

## 2023-06-26 ENCOUNTER — Ambulatory Visit: Payer: Medicaid Other | Admitting: Infectious Disease

## 2023-06-27 ENCOUNTER — Telehealth: Payer: Self-pay

## 2023-06-27 ENCOUNTER — Other Ambulatory Visit: Payer: Self-pay | Admitting: Internal Medicine

## 2023-06-27 ENCOUNTER — Ambulatory Visit: Payer: Medicaid Other | Admitting: Physical Therapy

## 2023-06-27 DIAGNOSIS — M25561 Pain in right knee: Secondary | ICD-10-CM

## 2023-06-27 NOTE — Telephone Encounter (Signed)
Patient called regarding a referral to [facility] at [facility], patient stated he has missed a couple of appointments and the office is requesting a new referral.

## 2023-06-27 NOTE — Progress Notes (Signed)
New referral to PT placed - patient missed appts and needs new referral.

## 2023-07-02 ENCOUNTER — Encounter: Payer: Medicaid Other | Admitting: Physical Therapy

## 2023-07-02 NOTE — Progress Notes (Deleted)
Chief complaint: Follow-up for HIV disease on medications is also experiencing some burning with urination Subjective:    Patient ID: Wayne Wolfe, male    DOB: Sep 22, 1966, 57 y.o.   MRN: [id number]  HIV Positive/AIDS   57 year old man previously perfectly suppressed on Prezista Norvir and Truvada whom I changed over to Atripla and then to Complera to [facility], excellent virological control.    He is followed by internal medicine and his A1c has come down to 8.9.  He is also followed at [facility] for high resolution anoscopy.  [name] is doing well and continues to be happy with Charlett Lango and knows how to take it with chewable food and avoid antiacids.  It is little bit concerned that he could have a sexually transmitted infection.  He had recently had some unprotected mutual sex orally, used condom with anal sex but concerned he could have an STI.       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   Hypertension 2009   Well controlled on HCTZ   Hypokalemia 01/12/2019   Knee effusion, right 07/19/2017   Lobar pneumonia (HCC) 01/12/2019   Obesity 10/19/2020   Obstructive sleep apnea 10/19/2020   Otitis media 03/13/2017   Polyuria 03/13/2017   Poorly controlled diabetes mellitus (HCC) 03/13/2017   Type 2 diabetes mellitus (HCC)    Well controlled on metformin   Weight loss 03/13/2017    Past Surgical History:  Procedure Laterality Date   CARPAL TUNNEL RELEASE Right 1990s   none      Family History  Problem Relation Age of Onset   Diabetes Mellitus II Sister    Colon polyps Sister    Diabetes Mellitus II Mother     Coronary artery disease Mother 82       CABGx6   Coronary artery disease Sister 77       CABG   Diabetes Mellitus II Sister    Coronary artery disease Sister    Diabetes Mellitus II Sister    Colon cancer Neg Hx    Esophageal cancer Neg Hx    Rectal cancer Neg Hx    Stomach cancer Neg Hx       Social History   Socioeconomic History   Marital status: Single    Spouse name: Not on file   Number of children: Not on file   Years of education: 13   Highest education level: Not on file  Occupational History    Employer: UNEMPLOYED  Tobacco Use   Smoking status: Former    Current packs/day: 0.00    Types: Cigarettes    Quit date: 2003    Years since quitting: 21.7   Smokeless tobacco: Never  Vaping Use   Vaping status: Never Used  Substance and Sexual Activity   Alcohol use: No    Alcohol/week: 0.0 standard drinks of alcohol    Comment: Last used 2004   Drug use: No    Comment: Last used 2003   Sexual activity: Yes    Partners: Male    Comment: accepted condoms  Other Topics Concern  Not on file  Social History Narrative   On disability. Lives in [place].    Social Determinants of Health   Financial Resource Strain: Medium Risk (12/25/2022)   Overall Financial Resource Strain (CARDIA)    Difficulty of Paying Living Expenses: Somewhat hard  Food Insecurity: Food Insecurity Present (12/25/2022)   Hunger Vital Sign    Worried About Running Out of Food in the Last Year: Sometimes true    Ran Out of Food in the Last Year: Sometimes true  Transportation Needs: No Transportation Needs (12/25/2022)   PRAPARE - Administrator, Civil Service (Medical): No    Lack of Transportation (Non-Medical): No  Physical Activity: Inactive (12/25/2022)   Exercise Vital Sign    Days of Exercise per Week: 1 day    Minutes of Exercise per Session: 0 min  Stress: Stress Concern Present (12/25/2022)   Wayne Wolfe [employer] Occupational [employer] - Occupational Stress Questionnaire     Feeling of Stress : To some extent  Social Connections: Moderately Integrated (12/25/2022)   Social Connection and Isolation Panel [NHANES]    Frequency of Communication with Friends and Family: More than three times a week    Frequency of Social Gatherings with Friends and Family: Three times a week    Attends Religious Services: More than 4 times per year    Active Member of Clubs or Organizations: Yes    Attends Banker Meetings: 1 to 4 times per year    Marital Status: Never married    No Known Allergies   Current Outpatient Medications:    Accu-Chek FastClix Lancets MISC, Check blood sugar before meals and after meals, Disp: 102 each, Rfl: 12   acetaminophen (TYLENOL) 500 MG tablet, Take 1,000 mg by mouth every 6 (six) hours as needed for fever., Disp: , Rfl:    Blood Glucose Monitoring Suppl (ACCU-CHEK GUIDE) w/Device KIT, 1 each by Does not apply route 6 (six) times daily., Disp: 1 kit, Rfl: 0   Continuous Glucose Sensor (DEXCOM G7 SENSOR) MISC, Change sensor every 10 days or as directed by your provider (for diabetes)., Disp: 3 each, Rfl: 3   diclofenac Sodium (VOLTAREN) 1 % GEL, Apply 4 grams topically 4 (four) times daily as needed, Disp: 100 g, Rfl: 3   empagliflozin (JARDIANCE) 10 MG TABS tablet, Take 1 tablet (10 mg total) by mouth daily before breakfast., Disp: 90 tablet, Rfl: 0   emtricitabine-rilpivir-tenofovir AF (ODEFSEY) 200-25-25 MG TABS tablet, Take 1 tablet by mouth daily with breakfast., Disp: 30 tablet, Rfl: 11   fexofenadine (ALLEGRA) 180 MG tablet, TAKE 1 TABLET(180 MG) BY MOUTH DAILY, Disp: 30 tablet, Rfl: 5   gabapentin (NEURONTIN) 300 MG capsule, Take 1 capsule (300 mg total) by mouth 3 (three) times daily., Disp: 90 capsule, Rfl: 2   glucose blood (ACCU-CHEK GUIDE) test strip, Check blood sugar before meals and after meals, Disp: 100 each, Rfl: 12   insulin glargine (LANTUS) 100 UNIT/ML Solostar Pen, Inject 30 Units into the skin daily., Disp: 15  mL, Rfl: 3   Insulin Pen Needle (PEN NEEDLES) 32G X 4 MM MISC, Use daily as directed., Disp: 200 each, Rfl: 3   liraglutide (VICTOZA) 18 MG/3ML SOPN, Inject 1.8 mg into the skin daily., Disp: 9 mL, Rfl: 2   lisinopril (ZESTRIL) 20 MG tablet, Take 1 tablet (20 mg total) by mouth daily., Disp: 90 tablet, Rfl: 3   metFORMIN (GLUCOPHAGE-XR) 500 MG 24 hr tablet, Take 2 tablets (1,000 mg total) by  mouth 2 (two) times daily with a meal., Disp: 360 tablet, Rfl: 2   metoprolol tartrate (LOPRESSOR) 100 MG tablet, Take 1 tablet by mouth two hours prior to scan, Disp: 1 tablet, Rfl: 0   rosuvastatin (CRESTOR) 20 MG tablet, TAKE 1 TABLET(20 MG) BY MOUTH DAILY, Disp: 90 tablet, Rfl: 3    Review of Systems  Constitutional:  Negative for activity change, appetite change, chills, diaphoresis, fatigue, fever and unexpected weight change.  HENT:  Negative for congestion, rhinorrhea, sinus pressure, sneezing, sore throat and trouble swallowing.   Eyes:  Negative for photophobia and visual disturbance.  Respiratory:  Negative for cough, chest tightness, shortness of breath, wheezing and stridor.   Cardiovascular:  Negative for chest pain, palpitations and leg swelling.  Gastrointestinal:  Negative for abdominal distention, abdominal pain, anal bleeding, blood in stool, constipation, diarrhea, nausea and vomiting.  Genitourinary:  Negative for difficulty urinating, dysuria, flank pain and hematuria.  Musculoskeletal:  Negative for arthralgias, back pain, gait problem, joint swelling and myalgias.  Skin:  Negative for color change, pallor, rash and wound.  Neurological:  Negative for dizziness, tremors, weakness and light-headedness.  Hematological:  Negative for adenopathy. Does not bruise/bleed easily.  Psychiatric/Behavioral:  Negative for agitation, behavioral problems, confusion, decreased concentration, dysphoric mood and sleep disturbance.        Objective:   Physical Exam Constitutional:       Appearance: He is well-developed.  HENT:     Head: Normocephalic and atraumatic.  Eyes:     Conjunctiva/sclera: Conjunctivae normal.  Cardiovascular:     Rate and Rhythm: Normal rate and regular rhythm.  Pulmonary:     Effort: Pulmonary effort is normal. No respiratory distress.     Breath sounds: No wheezing.  Abdominal:     General: There is no distension.     Palpations: Abdomen is soft.  Musculoskeletal:        General: No tenderness. Normal range of motion.     Cervical back: Normal range of motion and neck supple.  Skin:    General: Skin is warm and dry.     Coloration: Skin is not pale.     Findings: No erythema or rash.  Neurological:     General: No focal deficit present.     Mental Status: He is alert and oriented to person, place, and time.  Psychiatric:        Mood and Affect: Mood normal.        Behavior: Behavior normal.        Thought Content: Thought content normal.        Judgment: Judgment normal.           Assessment & Plan:    HIV disease:  I have reviewed Cliffton Asters Mielke's labs including viral load which was  Lab Results  Component Value Date   HIV1RNAQUANT Not Detected 06/12/2023   and cd4 which was  Lab Results  Component Value Date   CD4TABS 526 06/12/2023     HTN  Hyperlipidemia: continue crestor    DM; he will continue on    STI screening:   Vaccine counseling recommended updated COVID and flu vaccines as well as Zoster vaccination.

## 2023-07-03 ENCOUNTER — Ambulatory Visit: Payer: Medicaid Other | Admitting: Infectious Disease

## 2023-07-04 ENCOUNTER — Ambulatory Visit: Payer: Medicaid Other | Admitting: Infectious Disease

## 2023-07-04 ENCOUNTER — Encounter: Payer: Self-pay | Admitting: Infectious Disease

## 2023-07-04 ENCOUNTER — Other Ambulatory Visit (HOSPITAL_COMMUNITY)
Admission: RE | Admit: 2023-07-04 | Discharge: 2023-07-04 | Disposition: A | Discharge status: Home / Self Care | Payer: Medicaid Other | Source: Ambulatory Visit | Attending: Infectious Disease | Admitting: Infectious Disease

## 2023-07-04 ENCOUNTER — Encounter: Payer: Medicaid Other | Admitting: Physical Therapy

## 2023-07-04 ENCOUNTER — Other Ambulatory Visit: Payer: Self-pay

## 2023-07-04 VITALS — BP 131/84 | HR 107 | Temp 98.0°F | Wt 240.0 lb

## 2023-07-04 DIAGNOSIS — Z23 Encounter for immunization: Secondary | ICD-10-CM

## 2023-07-04 DIAGNOSIS — E1169 Type 2 diabetes mellitus with other specified complication: Secondary | ICD-10-CM

## 2023-07-04 DIAGNOSIS — R8290 Unspecified abnormal findings in urine: Secondary | ICD-10-CM

## 2023-07-04 DIAGNOSIS — B20 Human immunodeficiency virus [HIV] disease: Secondary | ICD-10-CM | POA: Diagnosis present

## 2023-07-04 DIAGNOSIS — E785 Hyperlipidemia, unspecified: Secondary | ICD-10-CM

## 2023-07-04 DIAGNOSIS — Z113 Encounter for screening for infections with a predominantly sexual mode of transmission: Secondary | ICD-10-CM

## 2023-07-04 DIAGNOSIS — K635 Polyp of colon: Secondary | ICD-10-CM

## 2023-07-04 DIAGNOSIS — Z7185 Encounter for immunization safety counseling: Secondary | ICD-10-CM

## 2023-07-04 HISTORY — DX: Encounter for immunization safety counseling: Z71.85

## 2023-07-04 HISTORY — DX: Unspecified abnormal findings in urine: R82.90

## 2023-07-04 MED ORDER — ODEFSEY 200-25-25 MG PO TABS
1.0000 | ORAL_TABLET | Freq: Every day | ORAL | 11 refills | Status: DC
Start: 2023-07-04 — End: 2023-09-13

## 2023-07-04 NOTE — Progress Notes (Signed)
Subjective:    Patient ID: Wayne Wolfe, male    DOB: 1966/02/27, 57 y.o.   MRN: [id number]  HPI  Discussed the use of AI scribe software for clinical note transcription with the patient, who gave verbal consent to proceed.  History of Present Illness   The patient, with a history of HIV and diabetes, presents with a new complaint of an unusual odor in his urine. He has noticed this change for a while and is unsure of the cause. He denies any other urinary symptoms such as pain, frequency, or changes in color. He has been adherent to his HIV medication, and his viral load has been undetectable for over 15 years. His CD4 count is also healthy at 526. He is currently managing his diabetes with Lantus insulin, but his recent blood sugar was elevated at 207. He has been screened for gonorrhea and chlamydia via urine test, which was negative, but has not been swabbed for oral or rectal gonorrhea and chlamydia. He has also been followed for colon health due to a previous polyp, but he is not due for another follow-up for a year or more.       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   Hypertension 2009   Well controlled on HCTZ   Hypokalemia 01/12/2019   Knee effusion, right 07/19/2017   Lobar pneumonia (HCC) 01/12/2019   Obesity 10/19/2020   Obstructive sleep apnea 10/19/2020   Otitis media 03/13/2017   Polyuria 03/13/2017   Poorly controlled diabetes mellitus (HCC) 03/13/2017   Type 2 diabetes mellitus (HCC)    Well controlled on metformin   Vaccine counseling 07/04/2023   Weight loss 03/13/2017    Past  Surgical History:  Procedure Laterality Date   CARPAL TUNNEL RELEASE Right 1990s   none      Family History  Problem Relation Age of Onset   Diabetes Mellitus II Sister    Colon polyps Sister    Diabetes Mellitus II Mother    Coronary artery disease Mother 58       CABGx6   Coronary artery disease Sister 72       CABG   Diabetes Mellitus II Sister    Coronary artery disease Sister    Diabetes Mellitus II Sister    Colon cancer Neg Hx    Esophageal cancer Neg Hx    Rectal cancer Neg Hx    Stomach cancer Neg Hx       Social History   Socioeconomic History   Marital status: Single    Spouse name: Not on file   Number of children: Not on file   Years of education: 13   Highest education level: Not on file  Occupational History    Employer: UNEMPLOYED  Tobacco Use   Smoking status: Former    Current packs/day: 0.00    Types: Cigarettes    Quit date: 2003    Years since quitting: 21.7   Smokeless tobacco: Never  Vaping Use   Vaping status: Never Used  Substance and Sexual Activity   Alcohol use: No    Alcohol/week:  0.0 standard drinks of alcohol    Comment: Last used 2004   Drug use: No    Comment: Last used 2003   Sexual activity: Yes    Partners: Male    Comment: accepted condoms  Other Topics Concern   Not on file  Social History Narrative   On disability. Lives in [place].    Social Determinants of Health   Financial Resource Strain: Medium Risk (12/25/2022)   Overall Financial Resource Strain (CARDIA)    Difficulty of Paying Living Expenses: Somewhat hard  Food Insecurity: Food Insecurity Present (12/25/2022)   Hunger Vital Sign    Worried About Running Out of Food in the Last Year: Sometimes true    Ran Out of Food in the Last Year: Sometimes true  Transportation Needs: No Transportation Needs (12/25/2022)   PRAPARE - Administrator, Civil Service (Medical): No    Lack of Transportation (Non-Medical): No  Physical Activity: Inactive  (12/25/2022)   Exercise Vital Sign    Days of Exercise per Week: 1 day    Minutes of Exercise per Session: 0 min  Stress: Stress Concern Present (12/25/2022)   [employer] of Occupational Health - Occupational Stress Questionnaire    Feeling of Stress : To some extent  Social Connections: Moderately Integrated (12/25/2022)   Social Connection and Isolation Panel [NHANES]    Frequency of Communication with Friends and Family: More than three times a week    Frequency of Social Gatherings with Friends and Family: Three times a week    Attends Religious Services: More than 4 times per year    Active Member of Clubs or Organizations: Yes    Attends Banker Meetings: 1 to 4 times per year    Marital Status: Never married    No Known Allergies   Current Outpatient Medications:    Accu-Chek FastClix Lancets MISC, Check blood sugar before meals and after meals, Disp: 102 each, Rfl: 12   acetaminophen (TYLENOL) 500 MG tablet, Take 1,000 mg by mouth every 6 (six) hours as needed for fever., Disp: , Rfl:    Blood Glucose Monitoring Suppl (ACCU-CHEK GUIDE) w/Device KIT, 1 each by Does not apply route 6 (six) times daily., Disp: 1 kit, Rfl: 0   Continuous Glucose Sensor (DEXCOM G7 SENSOR) MISC, Change sensor every 10 days or as directed by your provider (for diabetes)., Disp: 3 each, Rfl: 3   diclofenac Sodium (VOLTAREN) 1 % GEL, Apply 4 grams topically 4 (four) times daily as needed, Disp: 100 g, Rfl: 3   empagliflozin (JARDIANCE) 10 MG TABS tablet, Take 1 tablet (10 mg total) by mouth daily before breakfast., Disp: 90 tablet, Rfl: 0   emtricitabine-rilpivir-tenofovir AF (ODEFSEY) 200-25-25 MG TABS tablet, Take 1 tablet by mouth daily with breakfast., Disp: 30 tablet, Rfl: 11   gabapentin (NEURONTIN) 300 MG capsule, Take 1 capsule (300 mg total) by mouth 3 (three) times daily., Disp: 90 capsule, Rfl: 2   glucose blood (ACCU-CHEK GUIDE) test strip, Check blood sugar before meals and  after meals, Disp: 100 each, Rfl: 12   insulin glargine (LANTUS) 100 UNIT/ML Solostar Pen, Inject 30 Units into the skin daily., Disp: 15 mL, Rfl: 3   Insulin Pen Needle (PEN NEEDLES) 32G X 4 MM MISC, Use daily as directed., Disp: 200 each, Rfl: 3   lisinopril (ZESTRIL) 20 MG tablet, Take 1 tablet (20 mg total) by mouth daily., Disp: 90 tablet, Rfl: 3   metFORMIN (GLUCOPHAGE-XR) 500 MG 24 hr  tablet, Take 2 tablets (1,000 mg total) by mouth 2 (two) times daily with a meal., Disp: 360 tablet, Rfl: 2   metoprolol tartrate (LOPRESSOR) 100 MG tablet, Take 1 tablet by mouth two hours prior to scan, Disp: 1 tablet, Rfl: 0   rosuvastatin (CRESTOR) 20 MG tablet, TAKE 1 TABLET(20 MG) BY MOUTH DAILY, Disp: 90 tablet, Rfl: 3   fexofenadine (ALLEGRA) 180 MG tablet, TAKE 1 TABLET(180 MG) BY MOUTH DAILY (Patient not taking: Reported on 07/04/2023), Disp: 30 tablet, Rfl: 5   liraglutide (VICTOZA) 18 MG/3ML SOPN, Inject 1.8 mg into the skin daily., Disp: 9 mL, Rfl: 2   Review of Systems  Constitutional:  Negative for activity change, appetite change, chills, diaphoresis, fatigue, fever and unexpected weight change.  HENT:  Negative for congestion, rhinorrhea, sinus pressure, sneezing, sore throat and trouble swallowing.   Eyes:  Negative for photophobia and visual disturbance.  Respiratory:  Negative for cough, chest tightness, shortness of breath, wheezing and stridor.   Cardiovascular:  Negative for chest pain, palpitations and leg swelling.  Gastrointestinal:  Negative for abdominal distention, abdominal pain, anal bleeding, blood in stool, constipation, diarrhea, nausea and vomiting.  Genitourinary:  Negative for difficulty urinating, dysuria, flank pain and hematuria.  Musculoskeletal:  Negative for arthralgias, back pain, gait problem, joint swelling and myalgias.  Skin:  Negative for color change, pallor, rash and wound.  Neurological:  Negative for dizziness, tremors, weakness and light-headedness.   Hematological:  Negative for adenopathy. Does not bruise/bleed easily.  Psychiatric/Behavioral:  Negative for agitation, behavioral problems, confusion, decreased concentration, dysphoric mood and sleep disturbance.        Objective:   Physical Exam Constitutional:      Appearance: He is well-developed.  HENT:     Head: Normocephalic and atraumatic.  Eyes:     Conjunctiva/sclera: Conjunctivae normal.  Cardiovascular:     Rate and Rhythm: Normal rate and regular rhythm.  Pulmonary:     Effort: Pulmonary effort is normal. No respiratory distress.     Breath sounds: No wheezing.  Abdominal:     General: There is no distension.     Palpations: Abdomen is soft.  Musculoskeletal:        General: No tenderness. Normal range of motion.     Cervical back: Normal range of motion and neck supple.  Skin:    General: Skin is warm and dry.     Coloration: Skin is not pale.     Findings: No erythema or rash.  Neurological:     General: No focal deficit present.     Mental Status: He is alert and oriented to person, place, and time.  Psychiatric:        Mood and Affect: Mood normal.        Behavior: Behavior normal.        Thought Content: Thought content normal.        Judgment: Judgment normal.           Assessment & Plan:   Assessment and Plan    HIV Viral load undetectable, consistent with good medication adherence. CD4 count healthy at 526. -Continue current antiretroviral therapy [facility]).  Diabetes Recent blood glucose level elevated at 207. Patient currently on Lantus insulin. -Continue current diabetes management. including lantus,    Hyperlipidemia: continue the crestor rx   HTN: continue metoprolol, amlodipine   Urinary changes Patient reports unusual odor during urination. No other symptoms suggestive of urinary tract infection. -Order urinalysis to assess for proteinuria or other  abnormalities. -Refer to urology for further evaluation if symptoms  persist.  Sexual Health Recent urine tests negative for gonorrhea and chlamydia. Patient agrees to additional screening. -Perform oral and rectal swabs for gonorrhea and chlamydia.  Anal Cancer Screening Patient has history of high resolution anoscopy at [facility] for anal cancer screening, but follow-up schedule unclear. -Confirm follow-up plan with [facility] team.  General Health Maintenance -Continue current vaccinations (recently received flu and COVID vaccines).

## 2023-07-04 NOTE — Progress Notes (Deleted)
Chief complaint: Follow-up for HIV disease on medications is also experiencing some burning with urination Subjective:    Patient ID: Wayne Wolfe, male    DOB: 10-11-1965, 57 y.o.   MRN: [id number]  HIV Positive/AIDS   57 year old man previously perfectly suppressed on Prezista Norvir and Truvada whom I changed over to Atripla and then to Complera to [facility], excellent virological control.    He is followed by internal medicine and his A1c has come down to 8.9.  He is also followed at [facility] for high resolution anoscopy.  Wayne Wolfe is doing well and continues to be happy with Wayne Wolfe and knows how to take it with chewable food and avoid antiacids.  It is little bit concerned that he could have a sexually transmitted infection.  He had recently had some unprotected mutual sex orally, used condom with anal sex but concerned he could have an STI.       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   Hypertension 2009   Well controlled on HCTZ   Hypokalemia 01/12/2019   Knee effusion, right 07/19/2017   Lobar pneumonia (HCC) 01/12/2019   Obesity 10/19/2020   Obstructive sleep apnea 10/19/2020   Otitis media 03/13/2017   Polyuria 03/13/2017   Poorly controlled diabetes mellitus (HCC) 03/13/2017   Type 2 diabetes mellitus (HCC)    Well controlled on metformin   Weight loss 03/13/2017    Past Surgical History:  Procedure Laterality Date   CARPAL TUNNEL RELEASE Right 1990s   none      Family History  Problem Relation Age of Onset   Diabetes Mellitus II Sister    Colon polyps Sister    Diabetes Mellitus II Mother     Coronary artery disease Mother 45       CABGx6   Coronary artery disease Sister 73       CABG   Diabetes Mellitus II Sister    Coronary artery disease Sister    Diabetes Mellitus II Sister    Colon cancer Neg Hx    Esophageal cancer Neg Hx    Rectal cancer Neg Hx    Stomach cancer Neg Hx       Social History   Socioeconomic History   Marital status: Single    Spouse name: Not on file   Number of children: Not on file   Years of education: 13   Highest education level: Not on file  Occupational History    Employer: UNEMPLOYED  Tobacco Use   Smoking status: Former    Current packs/day: 0.00    Types: Cigarettes    Quit date: 2003    Years since quitting: 21.7   Smokeless tobacco: Never  Vaping Use   Vaping status: Never Used  Substance and Sexual Activity   Alcohol use: No    Alcohol/week: 0.0 standard drinks of alcohol    Comment: Last used 2004   Drug use: No    Comment: Last used 2003   Sexual activity: Yes    Partners: Male    Comment: accepted condoms  Other Topics Concern  Not on file  Social History Narrative   On disability. Lives in [place].    Social Determinants of Health   Financial Resource Strain: Medium Risk (12/25/2022)   Overall Financial Resource Strain (CARDIA)    Difficulty of Paying Living Expenses: Somewhat hard  Food Insecurity: Food Insecurity Present (12/25/2022)   Hunger Vital Sign    Worried About Running Out of Food in the Last Year: Sometimes true    Ran Out of Food in the Last Year: Sometimes true  Transportation Needs: No Transportation Needs (12/25/2022)   PRAPARE - Administrator, Civil Service (Medical): No    Lack of Transportation (Non-Medical): No  Physical Activity: Inactive (12/25/2022)   Exercise Vital Sign    Days of Exercise per Week: 1 day    Minutes of Exercise per Session: 0 min  Stress: Stress Concern Present (12/25/2022)   [employer] of Occupational Health - Occupational Stress Questionnaire     Feeling of Stress : To some extent  Social Connections: Moderately Integrated (12/25/2022)   Social Connection and Isolation Panel [NHANES]    Frequency of Communication with Friends and Family: More than three times a week    Frequency of Social Gatherings with Friends and Family: Three times a week    Attends Religious Services: More than 4 times per year    Active Member of Clubs or Organizations: Yes    Attends Banker Meetings: 1 to 4 times per year    Marital Status: Never married    No Known Allergies   Current Outpatient Medications:    Accu-Chek FastClix Lancets MISC, Check blood sugar before meals and after meals, Disp: 102 each, Rfl: 12   acetaminophen (TYLENOL) 500 MG tablet, Take 1,000 mg by mouth every 6 (six) hours as needed for fever., Disp: , Rfl:    Blood Glucose Monitoring Suppl (ACCU-CHEK GUIDE) w/Device KIT, 1 each by Does not apply route 6 (six) times daily., Disp: 1 kit, Rfl: 0   Continuous Glucose Sensor (DEXCOM G7 SENSOR) MISC, Change sensor every 10 days or as directed by your provider (for diabetes)., Disp: 3 each, Rfl: 3   diclofenac Sodium (VOLTAREN) 1 % GEL, Apply 4 grams topically 4 (four) times daily as needed, Disp: 100 g, Rfl: 3   empagliflozin (JARDIANCE) 10 MG TABS tablet, Take 1 tablet (10 mg total) by mouth daily before breakfast., Disp: 90 tablet, Rfl: 0   emtricitabine-rilpivir-tenofovir AF (ODEFSEY) 200-25-25 MG TABS tablet, Take 1 tablet by mouth daily with breakfast., Disp: 30 tablet, Rfl: 11   fexofenadine (ALLEGRA) 180 MG tablet, TAKE 1 TABLET(180 MG) BY MOUTH DAILY, Disp: 30 tablet, Rfl: 5   gabapentin (NEURONTIN) 300 MG capsule, Take 1 capsule (300 mg total) by mouth 3 (three) times daily., Disp: 90 capsule, Rfl: 2   glucose blood (ACCU-CHEK GUIDE) test strip, Check blood sugar before meals and after meals, Disp: 100 each, Rfl: 12   insulin glargine (LANTUS) 100 UNIT/ML Solostar Pen, Inject 30 Units into the skin daily., Disp: 15  mL, Rfl: 3   Insulin Pen Needle (PEN NEEDLES) 32G X 4 MM MISC, Use daily as directed., Disp: 200 each, Rfl: 3   liraglutide (VICTOZA) 18 MG/3ML SOPN, Inject 1.8 mg into the skin daily., Disp: 9 mL, Rfl: 2   lisinopril (ZESTRIL) 20 MG tablet, Take 1 tablet (20 mg total) by mouth daily., Disp: 90 tablet, Rfl: 3   metFORMIN (GLUCOPHAGE-XR) 500 MG 24 hr tablet, Take 2 tablets (1,000 mg total) by  mouth 2 (two) times daily with a meal., Disp: 360 tablet, Rfl: 2   metoprolol tartrate (LOPRESSOR) 100 MG tablet, Take 1 tablet by mouth two hours prior to scan, Disp: 1 tablet, Rfl: 0   rosuvastatin (CRESTOR) 20 MG tablet, TAKE 1 TABLET(20 MG) BY MOUTH DAILY, Disp: 90 tablet, Rfl: 3    Review of Systems     Objective:   Physical Exam        Assessment & Plan:    HIV disease:  I have reviewed Wayne Wolfe's labs including viral load which was  Lab Results  Component Value Date   HIV1RNAQUANT Not Detected 06/12/2023   and cd4 which was  Lab Results  Component Value Date   CD4TABS 526 06/12/2023     HTN  Hyperlipidemia: continue crestor    DM; he will continue on    STI screening:   Vaccine counseling recommended updated COVID and flu vaccines as well as Zoster vaccination.

## 2023-07-04 NOTE — Addendum Note (Signed)
Addended by: Juanita Laster on: 07/04/2023 03:43 PM   Modules accepted: Orders

## 2023-07-05 LAB — URINALYSIS, ROUTINE W REFLEX MICROSCOPIC
Bilirubin Urine: NEGATIVE
Hgb urine dipstick: NEGATIVE
Ketones, ur: NEGATIVE
Leukocytes,Ua: NEGATIVE
Nitrite: NEGATIVE
Protein, ur: NEGATIVE
Specific Gravity, Urine: 1.025 (ref 1.001–1.035)
pH: 6.5 (ref 5.0–8.0)

## 2023-07-08 LAB — CYTOLOGY, (ORAL, ANAL, URETHRAL) ANCILLARY ONLY
Chlamydia: NEGATIVE
Chlamydia: NEGATIVE
Comment: NEGATIVE
Comment: NORMAL
Comment: NEGATIVE
Comment: NORMAL
Neisseria Gonorrhea: NEGATIVE
Neisseria Gonorrhea: NEGATIVE

## 2023-07-10 ENCOUNTER — Telehealth: Payer: Self-pay

## 2023-07-10 NOTE — Telephone Encounter (Signed)
Patient called asking if you were going to refer him to a urologist. If so I am happy to place the referral. Patient also advised of negative STI results

## 2023-07-11 ENCOUNTER — Other Ambulatory Visit: Payer: Self-pay

## 2023-07-11 ENCOUNTER — Encounter: Payer: Medicaid Other | Admitting: Dietician

## 2023-07-11 DIAGNOSIS — R8290 Unspecified abnormal findings in urine: Secondary | ICD-10-CM

## 2023-07-16 ENCOUNTER — Other Ambulatory Visit (HOSPITAL_COMMUNITY): Payer: Self-pay

## 2023-07-25 ENCOUNTER — Encounter: Payer: Medicaid Other | Admitting: Internal Medicine

## 2023-07-29 ENCOUNTER — Ambulatory Visit: Payer: Medicaid Other | Admitting: Physical Therapy

## 2023-07-29 NOTE — Progress Notes (Unsigned)
  Cardiology Office Note:  .   Date:  07/30/2023  ID:  Wayne Wolfe, DOB 1966/06/24, MRN [id number] PCP: Marrianne Mood, MD  [facility] Providers Cardiologist:  Ashlley Booher  Click to update primary MD,subspecialty MD or APP then REFRESH:1}   History of Present Illness: . April 29, 2023   Wayne Wolfe is a 57 y.o. male who we are seeing today for episodes of chest pain.  He has a history of diabetes mellitus, hypertension, hyperlipidemia.  Has occasional episodes of CP  Occasional dyspnea   Lung CT ( to follow up for a lung nodule) showed Coronary artery calcification    CP occurs at rest and also while walking   CP will last for several minutes.   Eats an unrestricted diet HbA1c is 11.3  Regularly eats processed    Does not get regular exercise  Is not working currently  Worked at [employer], Is working on getting another job  Has DOE   July 30, 2023: Wayne Wolfe is seen today for follow-up of his poorly controlled diabetes mellitus, chest pain.  We ordered a coronary CT angiogram during his last visit. His CAC score is 264 which is 95th percentile for age and sex matched controls. He has moderate stenosis in the very distal aspect of the LAD but otherwise only mild coronary artery disease.  His LDL goal is less than 70.  His last LDL from Feb 14, 2023 was 68.  Total cholesterol was 116.  HDL is 30.  Triglyceride level is 97.  Getting out and exercising . Riding his bike     ROS:   Studies Reviewed: .            Risk Assessment/Calculations:          Physical Exam:     Physical Exam: Blood pressure 118/76, pulse 71, height 5\' 7"  (1.702 m), weight 243 lb 9.6 oz (110.5 kg), SpO2 98%.       GEN:  moderately obese, middle age male  in no acute distress HEENT: Normal NECK: No JVD; No carotid bruits LYMPHATICS: No lymphadenopathy CARDIAC: RRR , no murmurs, rubs, gallops RESPIRATORY:  Clear to auscultation without rales, wheezing or rhonchi  ABDOMEN:  Soft, non-tender, non-distended MUSCULOSKELETAL:  No edema; No deformity  SKIN: Warm and dry NEUROLOGIC:  Alert and oriented x 3   ECG:       ASSESSMENT AND PLAN: .    1.  Chest pain:   No further episodes of PC  His goal LDL is < 70 His last LDL was 68     2.  Poorly controlled diabetes mellitus:   Advised him to work on better diabetic control   Will have him see  Dr. Odis Hollingshead in a year          Dispo:  1 year   Signed, Kristeen Miss, MD

## 2023-07-30 ENCOUNTER — Ambulatory Visit: Payer: Medicaid Other | Attending: Cardiovascular Disease | Admitting: Cardiovascular Disease

## 2023-07-30 ENCOUNTER — Encounter: Payer: Self-pay | Admitting: Cardiovascular Disease

## 2023-07-30 VITALS — BP 118/76 | HR 71 | Ht 67.0 in | Wt 243.6 lb

## 2023-07-30 DIAGNOSIS — E782 Mixed hyperlipidemia: Secondary | ICD-10-CM | POA: Diagnosis not present

## 2023-07-30 DIAGNOSIS — I2510 Atherosclerotic heart disease of native coronary artery without angina pectoris: Secondary | ICD-10-CM

## 2023-07-30 NOTE — Patient Instructions (Signed)
Follow-Up: At [facility], you and your health needs are our priority.  As part of our continuing mission to provide you with exceptional heart care, we have created designated Provider Care Teams.  These Care Teams include your primary Cardiologist (physician) and Advanced Practice Providers (APPs -  Physician Assistants and Nurse Practitioners) who all work together to provide you with the care you need, when you need it.  We recommend signing up for the patient portal called "MyChart".  Sign up information is provided on this After Visit Summary.  MyChart is used to connect with patients for Virtual Visits (Telemedicine).  Patients are able to view lab/test results, encounter notes, upcoming appointments, etc.  Non-urgent messages can be sent to your provider as well.   To learn more about what you can do with MyChart, go to [email or web address].    Your next appointment:   1 year(s)  Provider:   Emelda Brothers, MD

## 2023-07-31 NOTE — Therapy (Addendum)
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   Hypertension 2009   Well controlled on HCTZ   Hypokalemia 01/12/2019   Knee effusion, right 07/19/2017   Lobar pneumonia (HCC) 01/12/2019   Obesity 10/19/2020   Obstructive sleep apnea 10/19/2020   Otitis media 03/13/2017   Polyuria 03/13/2017   Poorly controlled diabetes mellitus (HCC) 03/13/2017   Type 2 diabetes mellitus (HCC)    Well controlled on metformin   Urine abnormality 07/04/2023   Vaccine counseling 07/04/2023   Weight loss 03/13/2017   Past Surgical History:  Procedure Laterality Date   CARPAL TUNNEL RELEASE Right 1990s   none     Patient Active Problem List   Diagnosis Date Noted   Vaccine counseling 07/04/2023   Urine abnormality 07/04/2023   Chest pain 12/25/2022   Knee pain 06/20/2022   Environmental allergies 02/16/2022   Healthcare maintenance 12/04/2021   Obstructive sleep apnea 10/19/2020   Morbid obesity (HCC) 10/19/2020   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   Multiple joint pain 02/11/2020   Syphilis 12/14/2019   Unprotected sexual intercourse 12/01/2019   Pulmonary nodule 12/26/2017   Routine screening for STI (sexually transmitted infection) 10/16/2017   Otitis media 03/13/2017   Polyuria 03/13/2017   Colon cancer screening 02/24/2016   Testicular pain 01/20/2016   Numbness and tingling of both feet 08/26/2013   Keloid of skin 06/25/2012   Type 2  diabetes mellitus with other specified complication (HCC) 03/12/2011   Hyperlipidemia associated with type 2 diabetes mellitus (HCC) 03/12/2011   Hypertension associated with diabetes (HCC) 04/20/2008   Human immunodeficiency virus (HIV) disease (HCC) 04/15/2008    PCP: Earl Lagos, MD  REFERRING PROVIDER: Dickie La MD  REFERRING DIAG: [phone] (ICD-10-CM) - Acute pain of right knee  THERAPY DIAG:  Chronic pain of right knee - Plan: PT plan of care cert/re-cert  Muscle weakness (generalized) - Plan: PT plan of care cert/re-cert  Rationale for Evaluation and Treatment: Rehabilitation  ONSET DATE: 3-6 mos   SUBJECTIVE:   SUBJECTIVE STATEMENT: Patient returns to OPPT with continued R knee pain and swelling.  Pain mainly to medial aspect of knee.  Any activity exacerbates pain, no imaging studies to date, no return MD f/u yet.   PERTINENT HISTORY: 6 mos + PAIN:  Are you having pain? Yes: NPRS scale: 8-9/10 Pain location: Rt medial  Pain description: sharp stabbing burn, ache  Aggravating factors: walking, bending, activity Relieving factors: ice, tylenol   PRECAUTIONS: Other: none   RED FLAGS: None   WEIGHT BEARING RESTRICTIONS: No  FALLS:  Has patient fallen in last 6 months? No  LIVING ENVIRONMENT: Lives with: lives alone Lives in: House/apartment Stairs: Yes: External: 3 steps; on right going up Has following equipment at home: None  OCCUPATION: Not working , has had multiple jobs in various fields   PLOF: Independent, family and friends, sports events, shopping  PATIENT GOALS: I want to be able to move better.  Get rid of this pain.   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  MUSCLE LENGTH: Tightness in hips ER and IR   POSTURE:  genu varum    PALPATION: Pain medial joint line in Rt LE Joint effusion/+ bounce home  LOWER EXTREMITY ROM:  A/PROM Right eval Left eval  Hip flexion    Hip extension    Hip abduction    Hip adduction    Hip internal rotation    Hip external rotation    Knee flexion 92 124  Knee extension -4/0d 0  Ankle dorsiflexion    Ankle plantarflexion    Ankle inversion    Ankle eversion     (Blank rows = not tested)  LOWER EXTREMITY MMT:  MMT Right eval Left eval  Hip flexion 4- 4  Hip extension    Hip abduction 3 3+  Hip adduction    Hip internal rotation    Hip external rotation    Knee flexion 4+ 5  Knee extension 4-  5  Ankle dorsiflexion    Ankle plantarflexion    Ankle inversion    Ankle eversion     (Blank rows = not tested)  LOWER EXTREMITY SPECIAL TESTS:  Knee neg for M/L stability testing  Poor quad set ability Joint effusion observed  FUNCTIONAL TESTS:  5 times sit to stand:     GAIT: Distance walked: 62ft x2 Assistive device utilized: None Level of assistance: Complete Independence Comments: mild antalgia   TODAY'S TREATMENT:                                                                                                                              DATE: 08/02/23 Eval   PATIENT EDUCATION:  Education details: POC, HEP and support of knee with improved hip mobility and strength  Person educated: Patient Education method: Explanation Education comprehension: verbalized understanding and returned demonstration  HOME EXERCISE PROGRAM: Access Code: [email or web address] URL: [email or web address] Date: 08/02/2023 Prepared by: Gustavus Bryant  Exercises - Seated Long Arc Quad  - 3 x daily - 5 x weekly - 3 sets - 10 reps - 2s hold - Seated Heel Slide  - 3 x daily - 5 x weekly - 3 sets - 10 reps - Standing Heel Raise with Support  - 3 x daily - 5 x weekly - 3 sets - 10 reps  ASSESSMENT:  CLINICAL IMPRESSION: Patient is a 57 y.o. male who was seen today  for physical therapy evaluation and treatment for Rt knee pain. He presents with worsening symptoms since previous visit.  He demonstrates R knee effusion, decreased knee ROM, poor quad set control and decreased proprioception.  OBJECTIVE IMPAIRMENTS: decreased mobility, difficulty walking, decreased ROM, decreased strength, increased edema, increased fascial restrictions, impaired flexibility, postural  dysfunction, obesity, and pain.   ACTIVITY LIMITATIONS: carrying, lifting, bending, standing, squatting, sleeping, stairs, transfers, and locomotion level  PARTICIPATION LIMITATIONS: shopping and community activity  PERSONAL FACTORS: Fitness and 1 comorbidity: diabetes  are also affecting patient's functional outcome.   REHAB POTENTIAL: Good  CLINICAL DECISION MAKING: Evolving  EVALUATION COMPLEXITY: Low   GOALS:   LONG TERM GOALS: Target date: 10/02/2023      Pt will be independent with home exercise program for lower extremity strength Baseline: [id number] Goal status: INITIAL  2.  Patient will increase  strength to 4 out of 5 bilaterally in order to support knees with functional activity Baseline:  MMT Right eval Left eval  Hip flexion 4- 4  Hip extension    Hip abduction 3 3+  Hip adduction    Hip internal rotation    Hip external rotation    Knee flexion 4+ 5  Knee extension 4-  5   Goal status: INITIAL  3.  Patient will increase 30s chair stand reps from 4 to 8 with/without arms to demonstrate and improved functional ability with less pain/difficulty as well as reduce fall risk.  Baseline: 4 reps Goal status: INITIAL  4.  Patient will be able to walk in the community, shopping with pain no more than 4/10 Baseline: 8-9/10 avg. Goal status: INITIAL  5.   Increase R knee AROM to 0d extension and 125d flexion Baseline: -4d and 92d respectively Goal status: INITIAL  6.  Patient will score at least 43% on FOTO to signify clinically meaningful improvement in  functional abilities.   Baseline: 15 Goal status: INITIAL   PLAN:  PT FREQUENCY: 2x/week  PT DURATION: 6 weeks  PLANNED INTERVENTIONS: 97164- PT Re-evaluation, 97110-Therapeutic exercises, 97530- Therapeutic activity, 97112- Neuromuscular re-education, 97535- Self Care, 16109- Manual therapy, [phone]- Gait training, [phone]- Ionotophoresis 4mg /ml Dexamethasone, Patient/Family education, Balance training, Taping, Dry Needling, and Joint mobilization  PLAN FOR NEXT SESSION: NuStep, HEP, gait and functional testing , hip strength   For all possible CPT codes, reference the Planned Interventions line above.     Check all conditions that are expected to impact treatment: {Conditions expected to impact treatment:Morbid obesity   If treatment provided at initial evaluation, no treatment charged due to lack of authorization.      Hildred Laser, PT 09/10/2023, 1:35 PM

## 2023-08-02 ENCOUNTER — Ambulatory Visit: Payer: Medicaid Other | Attending: Internal Medicine

## 2023-08-02 ENCOUNTER — Other Ambulatory Visit: Payer: Self-pay

## 2023-08-02 DIAGNOSIS — M25561 Pain in right knee: Secondary | ICD-10-CM | POA: Insufficient documentation

## 2023-08-02 DIAGNOSIS — M6281 Muscle weakness (generalized): Secondary | ICD-10-CM | POA: Diagnosis present

## 2023-08-02 DIAGNOSIS — G8929 Other chronic pain: Secondary | ICD-10-CM | POA: Diagnosis present

## 2023-08-07 ENCOUNTER — Other Ambulatory Visit (HOSPITAL_COMMUNITY): Payer: Self-pay

## 2023-08-08 ENCOUNTER — Other Ambulatory Visit (HOSPITAL_COMMUNITY): Payer: Self-pay

## 2023-08-14 ENCOUNTER — Other Ambulatory Visit (HOSPITAL_COMMUNITY): Payer: Self-pay

## 2023-08-14 NOTE — Therapy (Deleted)
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   Hypertension 2009   Well controlled on HCTZ   Hypokalemia 01/12/2019   Knee effusion, right 07/19/2017   Lobar pneumonia (HCC) 01/12/2019   Obesity 10/19/2020   Obstructive sleep apnea 10/19/2020   Otitis media 03/13/2017   Polyuria 03/13/2017   Poorly controlled diabetes mellitus (HCC) 03/13/2017   Type 2 diabetes mellitus (HCC)    Well controlled on metformin   Urine abnormality 07/04/2023   Vaccine counseling 07/04/2023   Weight loss 03/13/2017   Past Surgical History:  Procedure Laterality Date   CARPAL TUNNEL RELEASE Right 1990s   none     Patient Active Problem List   Diagnosis Date Noted   Vaccine counseling 07/04/2023   Urine abnormality 07/04/2023   Chest pain 12/25/2022   Knee pain 06/20/2022   Environmental allergies 02/16/2022   Healthcare maintenance 12/04/2021   Obstructive sleep apnea 10/19/2020   Morbid obesity (HCC) 10/19/2020   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   Multiple joint pain 02/11/2020   Syphilis 12/14/2019   Unprotected sexual intercourse 12/01/2019   Pulmonary nodule 12/26/2017   Routine screening for STI (sexually transmitted infection) 10/16/2017   Otitis media 03/13/2017   Polyuria 03/13/2017   Colon cancer screening 02/24/2016   Testicular pain 01/20/2016   Numbness and tingling of both feet 08/26/2013   Keloid of skin 06/25/2012   Type 2  diabetes mellitus with other specified complication (HCC) 03/12/2011   Hyperlipidemia associated with type 2 diabetes mellitus (HCC) 03/12/2011   Hypertension associated with diabetes (HCC) 04/20/2008   Human immunodeficiency virus (HIV) disease (HCC) 04/15/2008    PCP: Earl Lagos, MD  REFERRING PROVIDER: Dickie La MD  REFERRING DIAG: M25.561 (ICD-10-CM) - Acute pain of right knee  THERAPY DIAG:  No diagnosis found.  Rationale for Evaluation and Treatment: Rehabilitation  ONSET DATE: 3-6 mos   SUBJECTIVE:   SUBJECTIVE STATEMENT: Patient returns to OPPT with continued R knee pain and swelling.  Pain mainly to medial aspect of knee.  Any activity exacerbates pain, no imaging studies to date, no return MD f/u yet.   PERTINENT HISTORY: 6 mos + PAIN:  Are you having pain? Yes: NPRS scale: 8-9/10 Pain location: Rt medial  Pain description: sharp stabbing burn, ache  Aggravating factors: walking, bending, activity Relieving factors: ice, tylenol   PRECAUTIONS: Other: none   RED FLAGS: None   WEIGHT BEARING RESTRICTIONS: No  FALLS:  Has patient fallen in last 6 months? No  LIVING ENVIRONMENT: Lives with: lives alone Lives in: House/apartment Stairs: Yes: External: 3 steps; on right going up Has following equipment at home: None  OCCUPATION: Not working , has had multiple jobs in various fields   PLOF: Independent, family and friends, sports events, shopping  PATIENT GOALS: I want to be able to move better.  Get rid of this pain.   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  MUSCLE LENGTH: Tightness in hips ER and IR   POSTURE:  genu varum   PALPATION: Pain medial joint line in Rt LE Joint effusion/+ bounce home  LOWER EXTREMITY ROM:  A/PROM Right eval  Left eval  Hip flexion    Hip extension    Hip abduction    Hip adduction    Hip internal rotation    Hip external rotation    Knee flexion 92 124  Knee extension -4/0d 0  Ankle dorsiflexion    Ankle plantarflexion    Ankle inversion    Ankle eversion     (Blank rows = not tested)  LOWER EXTREMITY MMT:  MMT Right eval Left eval  Hip flexion 4- 4  Hip extension    Hip abduction 3 3+  Hip adduction    Hip internal rotation    Hip external rotation    Knee flexion 4+ 5  Knee extension 4-  5  Ankle dorsiflexion    Ankle plantarflexion    Ankle inversion    Ankle eversion     (Blank rows = not tested)  LOWER EXTREMITY SPECIAL TESTS:  Knee neg for M/L stability testing  Poor quad set ability Joint effusion observed  FUNCTIONAL TESTS:  5 times sit to stand:     GAIT: Distance walked: 29ft x2 Assistive device utilized: None Level of assistance: Complete Independence Comments: mild antalgia   TODAY'S TREATMENT:                                                                                                                              DATE: 08/02/23 Eval   PATIENT EDUCATION:  Education details: POC, HEP and support of knee with improved hip mobility and strength  Person educated: Patient Education method: Explanation Education comprehension: verbalized understanding and returned demonstration  HOME EXERCISE PROGRAM: Access Code: [email or web address] URL: [email or web address] Date: 08/02/2023 Prepared by: Gustavus Bryant  Exercises - Seated Long Arc Quad  - 3 x daily - 5 x weekly - 3 sets - 10 reps - 2s hold - Seated Heel Slide  - 3 x daily - 5 x weekly - 3 sets - 10 reps - Standing Heel Raise with Support  - 3 x daily - 5 x weekly - 3 sets - 10 reps  ASSESSMENT:  CLINICAL IMPRESSION: Patient is a 57 y.o. male who was seen today for physical therapy evaluation and treatment for Rt knee pain. He presents with worsening symptoms since previous visit.  He  demonstrates R knee effusion, decreased knee ROM, poor quad set control and decreased proprioception.  OBJECTIVE IMPAIRMENTS: decreased mobility, difficulty walking, decreased ROM, decreased strength, increased edema, increased fascial restrictions, impaired flexibility, postural dysfunction, obesity, and pain.   ACTIVITY LIMITATIONS: carrying, lifting, bending, standing, squatting, sleeping, stairs, transfers, and locomotion level  PARTICIPATION  LIMITATIONS: shopping and community activity  PERSONAL FACTORS: Fitness and 1 comorbidity: diabetes  are also affecting patient's functional outcome.   REHAB POTENTIAL: Good  CLINICAL DECISION MAKING: Evolving  EVALUATION COMPLEXITY: Low   GOALS:   LONG TERM GOALS: Target date: 10/02/2023      Pt will be independent with home exercise program for lower extremity strength Baseline: [email or web address] Goal status: INITIAL  2.  Patient will increase  strength to 4 out of 5 bilaterally in order to support knees with functional activity Baseline:  MMT Right eval Left eval  Hip flexion 4- 4  Hip extension    Hip abduction 3 3+  Hip adduction    Hip internal rotation    Hip external rotation    Knee flexion 4+ 5  Knee extension 4-  5   Goal status: INITIAL  3.  Patient will increase 30s chair stand reps from 4 to 8 with/without arms to demonstrate and improved functional ability with less pain/difficulty as well as reduce fall risk.  Baseline: 4 reps Goal status: INITIAL  4.  Patient will be able to walk in the community, shopping with pain no more than 4/10 Baseline: 8-9/10 avg. Goal status: INITIAL  5.   Increase R knee AROM to 0d extension and 125d flexion Baseline: -4d and 92d respectively Goal status: INITIAL  6.  Patient will score at least 43% on FOTO to signify clinically meaningful improvement in functional abilities.   Baseline: 15 Goal status: INITIAL   PLAN:  PT FREQUENCY: 2x/week  PT DURATION: 6 weeks  PLANNED  INTERVENTIONS: Therapeutic exercises, Therapeutic activity, Neuromuscular re-education, Balance training, Gait training, Patient/Family education, Self Care, Joint mobilization, Dry Needling, Taping, Ionotophoresis 4mg /ml Dexamethasone, Manual therapy, and Re-evaluation  PLAN FOR NEXT SESSION: NuStep, HEP, gait and functional testing , hip strength   For all possible CPT codes, reference the Planned Interventions line above.     Check all conditions that are expected to impact treatment: {Conditions expected to impact treatment:Morbid obesity   If treatment provided at initial evaluation, no treatment charged due to lack of authorization.      Hildred Laser, PT 08/14/2023, 9:24 AM

## 2023-08-15 ENCOUNTER — Ambulatory Visit: Payer: Medicaid Other

## 2023-08-15 DIAGNOSIS — G8929 Other chronic pain: Secondary | ICD-10-CM

## 2023-08-15 DIAGNOSIS — M25561 Pain in right knee: Secondary | ICD-10-CM | POA: Diagnosis not present

## 2023-08-15 DIAGNOSIS — M6281 Muscle weakness (generalized): Secondary | ICD-10-CM

## 2023-08-15 NOTE — Therapy (Signed)
OUTPATIENT PHYSICAL THERAPY TREATMENT NOTE   Patient Name: Wayne Wolfe MRN: [id number] DOB:1965-11-09, 57 y.o., male Today's Date: 08/15/2023  END OF SESSION:  PT End of Session - 08/15/23 0821     Visit Number 2    Number of Visits 12    Date for PT Re-Evaluation 10/02/23    Authorization Type [employer]    Authorization Time Period 27 VL    Authorization - Visit Number 1    Authorization - Number of Visits 27    PT Start Time 0830    PT Stop Time 0910    PT Time Calculation (min) 40 min    Activity Tolerance Patient tolerated treatment well;Patient limited by pain    Behavior During Therapy [facility] for tasks assessed/performed             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   Hypertension 2009   Well controlled on HCTZ   Hypokalemia 01/12/2019   Knee effusion, right 07/19/2017   Lobar pneumonia (HCC) 01/12/2019   Obesity 10/19/2020   Obstructive sleep apnea 10/19/2020   Otitis media 03/13/2017   Polyuria 03/13/2017   Poorly controlled diabetes mellitus (HCC) 03/13/2017   Type 2 diabetes mellitus (HCC)    Well controlled on metformin   Urine abnormality 07/04/2023   Vaccine counseling 07/04/2023   Weight loss 03/13/2017   Past Surgical History:  Procedure Laterality Date   CARPAL TUNNEL RELEASE Right 1990s   none     Patient Active Problem List   Diagnosis Date Noted   Vaccine counseling 07/04/2023   Urine abnormality 07/04/2023   Chest pain 12/25/2022   Knee pain 06/20/2022   Environmental allergies 02/16/2022   Healthcare maintenance 12/04/2021   Obstructive sleep apnea  10/19/2020   Morbid obesity (HCC) 10/19/2020   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   Multiple joint pain 02/11/2020   Syphilis 12/14/2019   Unprotected sexual intercourse 12/01/2019   Pulmonary nodule 12/26/2017   Routine screening for STI (sexually transmitted infection) 10/16/2017   Otitis media 03/13/2017   Polyuria 03/13/2017   Colon cancer screening 02/24/2016   Testicular pain 01/20/2016   Numbness and tingling of both feet 08/26/2013   Keloid of skin 06/25/2012   Type 2 diabetes mellitus with other specified complication (HCC) 03/12/2011   Hyperlipidemia associated with type 2 diabetes mellitus (HCC) 03/12/2011   Hypertension associated with diabetes (HCC) 04/20/2008   Human immunodeficiency virus (HIV) disease (HCC) 04/15/2008    PCP: Earl Lagos, MD  REFERRING PROVIDER: Dickie La MD  REFERRING DIAG: M25.561 (ICD-10-CM) - Acute pain of right knee  THERAPY DIAG:  Chronic pain of right knee  Muscle weakness (generalized)  Rationale for Evaluation and Treatment: Rehabilitation  ONSET DATE: 3-6 mos   SUBJECTIVE:   SUBJECTIVE STATEMENT: Patient reports that his knee pain continues and that he has been compliant with his HEP.    PERTINENT HISTORY: 6 mos + PAIN:  Are you having pain? Yes: NPRS scale: 7/10 Pain location: Rt medial  Pain description: sharp stabbing burn, ache  Aggravating factors: walking, bending, activity Relieving factors: ice, tylenol   PRECAUTIONS:  Other: none   RED FLAGS: None   WEIGHT BEARING RESTRICTIONS: No  FALLS:  Has patient fallen in last 6 months? No  LIVING ENVIRONMENT: Lives with: lives alone Lives in: House/apartment Stairs: Yes: External: 3 steps; on right going up Has following equipment at home: None  OCCUPATION: Not working , has had multiple jobs in various fields   PLOF: Independent, family and friends, sports events, shopping  PATIENT GOALS: I want to be able to move better.  Get rid of this pain.   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  MUSCLE LENGTH: Tightness in hips ER and IR   POSTURE:  genu varum   PALPATION: Pain medial joint line in Rt LE Joint effusion/+ bounce home  LOWER EXTREMITY ROM:  A/PROM Right eval Left eval  Hip flexion    Hip extension    Hip abduction    Hip adduction    Hip internal rotation    Hip external rotation    Knee flexion 92 124  Knee extension -4/0d 0  Ankle dorsiflexion    Ankle plantarflexion    Ankle inversion    Ankle eversion     (Blank rows = not tested)  LOWER EXTREMITY MMT:  MMT Right eval Left eval  Hip flexion 4- 4  Hip extension    Hip abduction 3 3+  Hip adduction    Hip internal rotation    Hip external rotation    Knee flexion 4+ 5  Knee extension 4-  5  Ankle dorsiflexion    Ankle plantarflexion    Ankle inversion    Ankle eversion     (Blank rows = not tested)  LOWER EXTREMITY SPECIAL TESTS:  Knee neg for M/L stability testing  Poor quad set ability Joint effusion observed  FUNCTIONAL TESTS:  5 times sit to stand:     GAIT: Distance walked: 27ft x2 Assistive device utilized: None Level of assistance: Complete Independence Comments: mild antalgia   TODAY'S TREATMENT:        [facility] Adult PT Treatment:                                                DATE: 08/15/23 Therapeutic Exercise: Nustep level 5 x 5 mins while gathering subjective and planning session with patient Standing hip abduction/extension x10 BIL Standing heel raises 2x10 Standing toe raises 2x10 Standing knee flexion stretch on 10" stacked steps x10 Omega knee extension 5# RLE only x10, x5 Omega knee flexion 15# x10, x5 Seated hamstring stretch 2x30" Rt STS x5 using UE on knees  DATE: 08/02/23 Eval   PATIENT EDUCATION:  Education details: POC, HEP and support of knee with improved hip mobility and strength  Person educated: Patient Education method: Explanation Education comprehension: verbalized understanding and returned demonstration  HOME EXERCISE PROGRAM: Access Code: [email or web address] URL: [email or web address] Date: 08/02/2023 Prepared by: Gustavus Bryant  Exercises - Seated Long Arc Quad  - 3 x daily - 5 x weekly - 3 sets - 10 reps - 2s hold - Seated Heel Slide  - 3 x daily - 5 x weekly - 3 sets - 10 reps - Standing Heel Raise with Support  - 3 x daily - 5 x weekly - 3 sets - 10 reps  ASSESSMENT:  CLINICAL IMPRESSION: Patient presents to first follow up PT session reporting continued Rt knee pain and that he has been compliant with his HEP. Session today focused on knee strengthening and ROM exercises. He is somewhat limited by pain throughout session, but is able to complete all exercises. Patient continues to benefit from skilled PT services and should be progressed as able to improve functional independence.    OBJECTIVE IMPAIRMENTS: decreased mobility, difficulty walking, decreased ROM, decreased strength, increased edema, increased fascial restrictions, impaired flexibility, postural dysfunction, obesity, and pain.   ACTIVITY LIMITATIONS: carrying, lifting, bending, standing, squatting, sleeping, stairs, transfers, and locomotion level  PARTICIPATION LIMITATIONS: shopping and community activity  PERSONAL FACTORS: Fitness and 1 comorbidity: diabetes  are also affecting patient's functional outcome.   REHAB POTENTIAL: Good  CLINICAL DECISION MAKING: Evolving  EVALUATION COMPLEXITY: Low   GOALS:   LONG TERM GOALS: Target date: 10/02/2023      Pt will be independent with home exercise program for lower extremity strength Baseline: [email or web address] Goal status: INITIAL  2.  Patient will  increase  strength to 4 out of 5 bilaterally in order to support knees with functional activity Baseline:  MMT Right eval Left eval  Hip flexion 4- 4  Hip extension    Hip abduction 3 3+  Hip adduction    Hip internal rotation    Hip external rotation    Knee flexion 4+ 5  Knee extension 4-  5   Goal status: INITIAL  3.  Patient will increase 30s chair stand reps from 4 to 8 with/without arms to demonstrate and improved functional ability with less pain/difficulty as well as reduce fall risk.  Baseline: 4 reps Goal status: INITIAL  4.  Patient will be able to walk in the community, shopping with pain no more than 4/10 Baseline: 8-9/10 avg. Goal status: INITIAL  5.   Increase R knee AROM to 0d extension and 125d flexion Baseline: -4d and 92d respectively Goal status: INITIAL  6.  Patient will score at least 43% on FOTO to signify clinically meaningful improvement in functional abilities.   Baseline: 15 Goal status: INITIAL   PLAN:  PT FREQUENCY: 2x/week  PT DURATION: 6 weeks  PLANNED INTERVENTIONS: Therapeutic exercises, Therapeutic activity, Neuromuscular re-education, Balance training, Gait training, Patient/Family education, Self Care, Joint mobilization, Dry Needling, Taping, Ionotophoresis 4mg /ml Dexamethasone, Manual therapy, and Re-evaluation  PLAN FOR NEXT SESSION: NuStep, HEP, gait and functional testing , hip strength   For all possible CPT codes, reference the Planned Interventions line above.     Check all conditions that are expected to impact treatment: {Conditions expected to impact treatment:Morbid obesity   If treatment provided at initial evaluation, no treatment charged due to lack of authorization.      Berta Minor, PTA 08/15/2023, 9:09 AM

## 2023-08-16 ENCOUNTER — Other Ambulatory Visit (HOSPITAL_COMMUNITY): Payer: Self-pay

## 2023-08-16 ENCOUNTER — Ambulatory Visit: Payer: Medicaid Other

## 2023-08-16 NOTE — Therapy (Unsigned)
OUTPATIENT PHYSICAL THERAPY LOWER EXTREMITY EVALUATION   Patient Name: Wayne Wolfe MRN: [id number] DOB:1965/12/02, 57 y.o., male Today's Date: 08/19/2023  END OF SESSION:  PT End of Session - 08/19/23 0828     Visit Number 3    Number of Visits 12    Date for PT Re-Evaluation 10/02/23    Authorization Type [employer]    Authorization Time Period 27 VL    Authorization - Number of Visits 27    PT Start Time 0830    PT Stop Time 0910    PT Time Calculation (min) 40 min    Activity Tolerance Patient tolerated treatment well;Patient limited by pain    Behavior During Therapy [facility] for tasks assessed/performed               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   Hypertension 2009   Well controlled on HCTZ   Hypokalemia 01/12/2019   Knee effusion, right 07/19/2017   Lobar pneumonia (HCC) 01/12/2019   Obesity 10/19/2020   Obstructive sleep apnea 10/19/2020   Otitis media 03/13/2017   Polyuria 03/13/2017   Poorly controlled diabetes mellitus (HCC) 03/13/2017   Type 2 diabetes mellitus (HCC)    Well controlled on metformin   Urine abnormality 07/04/2023   Vaccine counseling 07/04/2023   Weight loss 03/13/2017   Past Surgical History:  Procedure Laterality Date   CARPAL TUNNEL RELEASE Right 1990s   none     Patient Active Problem List   Diagnosis Date Noted   Vaccine counseling 07/04/2023   Urine abnormality 07/04/2023   Chest pain 12/25/2022   Knee pain 06/20/2022   Environmental allergies 02/16/2022   Healthcare maintenance 12/04/2021   Obstructive sleep apnea 10/19/2020   Morbid  obesity (HCC) 10/19/2020   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   Multiple joint pain 02/11/2020   Syphilis 12/14/2019   Unprotected sexual intercourse 12/01/2019   Pulmonary nodule 12/26/2017   Routine screening for STI (sexually transmitted infection) 10/16/2017   Otitis media 03/13/2017   Polyuria 03/13/2017   Colon cancer screening 02/24/2016   Testicular pain 01/20/2016   Numbness and tingling of both feet 08/26/2013   Keloid of skin 06/25/2012   Type 2 diabetes mellitus with other specified complication (HCC) 03/12/2011   Hyperlipidemia associated with type 2 diabetes mellitus (HCC) 03/12/2011   Hypertension associated with diabetes (HCC) 04/20/2008   Human immunodeficiency virus (HIV) disease (HCC) 04/15/2008    PCP: Earl Lagos, MD  REFERRING PROVIDER: Dickie La MD  REFERRING DIAG: M25.561 (ICD-10-CM) - Acute pain of right knee  THERAPY DIAG:  Chronic pain of right knee  Muscle weakness (generalized)  Rationale for Evaluation and Treatment: Rehabilitation  ONSET DATE: 3-6 mos   SUBJECTIVE:   SUBJECTIVE STATEMENT: Patient returns to [facility] with continued R knee pain and swelling.  Pain mainly to medial aspect of knee.  Any activity exacerbates pain, no imaging studies to date, no return MD f/u yet.   PERTINENT HISTORY: 6 mos + PAIN:  Are you having pain? Yes: NPRS scale: 8-9/10 Pain location: Rt medial  Pain description: sharp stabbing burn, ache  Aggravating factors: walking, bending, activity Relieving factors: ice, tylenol   PRECAUTIONS: Other: none   RED FLAGS: None   WEIGHT BEARING RESTRICTIONS: No  FALLS:  Has patient fallen in last 6 months? No  LIVING ENVIRONMENT: Lives with: lives alone Lives in: House/apartment Stairs: Yes: External: 3 steps; on right going up Has following equipment at home: None  OCCUPATION: Not working , has had multiple jobs in various fields   PLOF: Independent, family and friends, sports events, shopping  PATIENT  GOALS: I want to be able to move better.  Get rid of this pain.   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  MUSCLE LENGTH: Tightness in hips ER and IR   POSTURE:  genu varum   PALPATION: Pain medial joint line in Rt LE Joint effusion/+ bounce home  LOWER EXTREMITY ROM:  A/PROM Right eval Left eval  Hip flexion    Hip extension    Hip abduction    Hip adduction    Hip internal rotation    Hip external rotation    Knee flexion 92 124  Knee extension -4/0d 0  Ankle dorsiflexion    Ankle plantarflexion    Ankle inversion    Ankle eversion     (Blank rows = not tested)  LOWER EXTREMITY MMT:  MMT Right eval Left eval  Hip flexion 4- 4  Hip extension    Hip abduction 3 3+  Hip adduction    Hip internal rotation    Hip external rotation    Knee flexion 4+ 5  Knee extension 4-  5  Ankle dorsiflexion    Ankle plantarflexion    Ankle inversion    Ankle eversion     (Blank rows = not tested)  LOWER EXTREMITY SPECIAL TESTS:  Knee neg for M/L stability testing  Poor quad set ability Joint effusion observed  FUNCTIONAL TESTS:  5 times sit to stand:     GAIT: Distance walked: 32ft x2 Assistive device utilized: None Level of assistance: Complete Independence Comments: mild antalgia   TODAY'S TREATMENT:                                                                                                                              DATE: 08/02/23 Eval   PATIENT EDUCATION:  Education details: POC, HEP and support of knee with improved hip mobility and strength  Person educated: Patient Education method: Explanation Education comprehension: verbalized understanding and returned demonstration  HOME EXERCISE PROGRAM: Access Code: [email or web address] URL:  [email or web address] Date: 08/02/2023 Prepared by: Gustavus Bryant  Exercises - Seated Long Arc Quad  - 3 x daily - 5 x weekly - 3 sets - 10 reps - 2s hold - Seated Heel Slide  - 3 x daily - 5 x weekly -  3 sets - 10 reps - Standing Heel Raise with Support  - 3 x daily - 5 x weekly - 3 sets - 10 reps  ASSESSMENT:  CLINICAL IMPRESSION: Patient is a 57 y.o. male who was seen today for physical therapy evaluation and treatment for Rt knee pain. He presents with worsening symptoms since previous visit.  He demonstrates R knee effusion, decreased knee ROM, poor quad set control and decreased proprioception.  OBJECTIVE IMPAIRMENTS: decreased mobility, difficulty walking, decreased ROM, decreased strength, increased edema, increased fascial restrictions, impaired flexibility, postural dysfunction, obesity, and pain.   ACTIVITY LIMITATIONS: carrying, lifting, bending, standing, squatting, sleeping, stairs, transfers, and locomotion level  PARTICIPATION LIMITATIONS: shopping and community activity  PERSONAL FACTORS: Fitness and 1 comorbidity: diabetes  are also affecting patient's functional outcome.   REHAB POTENTIAL: Good  CLINICAL DECISION MAKING: Evolving  EVALUATION COMPLEXITY: Low   GOALS:   LONG TERM GOALS: Target date: 10/02/2023      Pt will be independent with home exercise program for lower extremity strength Baseline: [email or web address] Goal status: INITIAL  2.  Patient will increase  strength to 4 out of 5 bilaterally in order to support knees with functional activity Baseline:  MMT Right eval Left eval  Hip flexion 4- 4  Hip extension    Hip abduction 3 3+  Hip adduction    Hip internal rotation    Hip external rotation    Knee flexion 4+ 5  Knee extension 4-  5   Goal status: INITIAL  3.  Patient will increase 30s chair stand reps from 4 to 8 with/without arms to demonstrate and improved functional ability with less pain/difficulty as well as reduce fall  risk.  Baseline: 4 reps Goal status: INITIAL  4.  Patient will be able to walk in the community, shopping with pain no more than 4/10 Baseline: 8-9/10 avg. Goal status: INITIAL  5.   Increase R knee AROM to 0d extension and 125d flexion Baseline: -4d and 92d respectively Goal status: INITIAL  6.  Patient will score at least 43% on FOTO to signify clinically meaningful improvement in functional abilities.   Baseline: 15 Goal status: INITIAL   PLAN:  PT FREQUENCY: 2x/week  PT DURATION: 6 weeks  PLANNED INTERVENTIONS: Therapeutic exercises, Therapeutic activity, Neuromuscular re-education, Balance training, Gait training, Patient/Family education, Self Care, Joint mobilization, Dry Needling, Taping, Ionotophoresis 4mg /ml Dexamethasone, Manual therapy, and Re-evaluation  PLAN FOR NEXT SESSION: NuStep, HEP, gait and functional testing , hip strength   For all possible CPT codes, reference the Planned Interventions line above.     Check all conditions that are expected to impact treatment: {Conditions expected to impact treatment:Morbid obesity   If treatment provided at initial evaluation, no treatment charged due to lack of authorization.      Hildred Laser, PT 08/19/2023, 8:28 AM

## 2023-08-19 ENCOUNTER — Ambulatory Visit: Payer: Medicaid Other

## 2023-08-19 DIAGNOSIS — M25561 Pain in right knee: Secondary | ICD-10-CM | POA: Diagnosis not present

## 2023-08-19 DIAGNOSIS — M6281 Muscle weakness (generalized): Secondary | ICD-10-CM

## 2023-08-19 DIAGNOSIS — G8929 Other chronic pain: Secondary | ICD-10-CM

## 2023-08-19 NOTE — Therapy (Signed)
OUTPATIENT PHYSICAL THERAPY TREATMENT NOTE   Patient Name: Wayne Wolfe MRN: [id number] DOB:1966/05/21, 57 y.o., male Today's Date: 08/19/2023  END OF SESSION:  PT End of Session - 08/19/23 0828     Visit Number 3    Number of Visits 12    Date for PT Re-Evaluation 10/02/23    Authorization Type MCD [employer]    Authorization Time Period 27 VL    Authorization - Number of Visits 27    PT Start Time 0830    PT Stop Time 0910    PT Time Calculation (min) 40 min    Activity Tolerance Patient tolerated treatment well;Patient limited by pain    Behavior During Therapy [facility] for tasks assessed/performed             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   Hypertension 2009   Well controlled on HCTZ   Hypokalemia 01/12/2019   Knee effusion, right 07/19/2017   Lobar pneumonia (HCC) 01/12/2019   Obesity 10/19/2020   Obstructive sleep apnea 10/19/2020   Otitis media 03/13/2017   Polyuria 03/13/2017   Poorly controlled diabetes mellitus (HCC) 03/13/2017   Type 2 diabetes mellitus (HCC)    Well controlled on metformin   Urine abnormality 07/04/2023   Vaccine counseling 07/04/2023   Weight loss 03/13/2017   Past Surgical History:  Procedure Laterality Date   CARPAL TUNNEL RELEASE Right 1990s   none     Patient Active Problem List   Diagnosis Date Noted   Vaccine counseling 07/04/2023   Urine abnormality 07/04/2023   Chest pain 12/25/2022   Knee pain 06/20/2022   Environmental allergies 02/16/2022   Healthcare maintenance 12/04/2021   Obstructive sleep apnea 10/19/2020   Morbid obesity (HCC)  10/19/2020   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   Multiple joint pain 02/11/2020   Syphilis 12/14/2019   Unprotected sexual intercourse 12/01/2019   Pulmonary nodule 12/26/2017   Routine screening for STI (sexually transmitted infection) 10/16/2017   Otitis media 03/13/2017   Polyuria 03/13/2017   Colon cancer screening 02/24/2016   Testicular pain 01/20/2016   Numbness and tingling of both feet 08/26/2013   Keloid of skin 06/25/2012   Type 2 diabetes mellitus with other specified complication (HCC) 03/12/2011   Hyperlipidemia associated with type 2 diabetes mellitus (HCC) 03/12/2011   Hypertension associated with diabetes (HCC) 04/20/2008   Human immunodeficiency virus (HIV) disease (HCC) 04/15/2008    PCP: Earl Lagos, MD  REFERRING PROVIDER: Dickie La MD  REFERRING DIAG: [phone] (ICD-10-CM) - Acute pain of right knee  THERAPY DIAG:  Chronic pain of right knee  Muscle weakness (generalized)  Rationale for Evaluation and Treatment: Rehabilitation  ONSET DATE: 3-6 mos   SUBJECTIVE:   SUBJECTIVE STATEMENT: Feels knee is a bit more flexible, still notes pain at 6-8/10 intensity as well as swelling   PERTINENT HISTORY: 6 mos + PAIN:  Are you having pain? Yes: NPRS scale: 7/10 Pain location: Rt medial  Pain description: sharp stabbing burn, ache  Aggravating factors: walking, bending, activity Relieving factors: ice, tylenol   PRECAUTIONS: Other: none   RED FLAGS: None  WEIGHT BEARING RESTRICTIONS: No  FALLS:  Has patient fallen in last 6 months? No  LIVING ENVIRONMENT: Lives with: lives alone Lives in: House/apartment Stairs: Yes: External: 3 steps; on right going up Has following equipment at home: None  OCCUPATION: Not working , has had multiple jobs in various fields   PLOF: Independent, family and friends, sports events, shopping  PATIENT GOALS: I want to be able to move better.  Get rid of this pain.   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  MUSCLE LENGTH: Tightness in hips ER and IR   POSTURE:  genu varum   PALPATION: Pain medial joint line in Rt LE Joint effusion/+ bounce home  LOWER EXTREMITY ROM:  A/PROM Right eval Left eval  Hip flexion    Hip extension    Hip abduction    Hip adduction    Hip internal rotation    Hip external rotation    Knee flexion 92 124  Knee extension -4/0d 0  Ankle dorsiflexion    Ankle plantarflexion    Ankle inversion    Ankle eversion     (Blank rows = not tested)  LOWER EXTREMITY MMT:  MMT Right eval Left eval  Hip flexion 4- 4  Hip extension    Hip abduction 3 3+  Hip adduction    Hip internal rotation    Hip external rotation    Knee flexion 4+ 5  Knee extension 4-  5  Ankle dorsiflexion    Ankle plantarflexion    Ankle inversion    Ankle eversion     (Blank rows = not tested)  LOWER EXTREMITY SPECIAL TESTS:  Knee neg for M/L stability testing  Poor quad set ability Joint effusion observed  FUNCTIONAL TESTS:  30s chair stand test 4 reps   GAIT: Distance walked: 27ft x2 Assistive device utilized: None Level of assistance: Complete Independence Comments: mild antalgia   TODAY'S TREATMENT:        [facility] Adult PT Treatment:                                                DATE: 08/19/23 Therapeutic Exercise: Nustep L2 8 min QS 3s 15x SLR 2# 15x SAQs 2# 15x Heel slides with strap 15x FAQs with adduction 15x Heel raises against wall 15x March against wall 15/15 to promote SLS and knee flexion  TKE GTB 15x   [facility] Adult PT Treatment:                                                DATE: 08/15/23 Therapeutic Exercise: Nustep level 5 x 5 mins while gathering subjective and planning session with patient Standing hip abduction/extension x10 BIL Standing heel  raises 2x10 Standing toe raises 2x10 Standing knee flexion stretch on 10" stacked steps x10 Omega knee extension 5# RLE only x10, x5 Omega knee flexion 15# x10, x5 Seated hamstring stretch 2x30" Rt STS x5 using UE on knees  DATE: 08/02/23 Eval   PATIENT EDUCATION:  Education details: POC, HEP and support of knee with improved hip mobility and strength  Person educated: Patient Education method: Explanation Education comprehension: verbalized understanding and returned demonstration  HOME EXERCISE PROGRAM: Access Code: [email or web address] URL: [email or web address] Date: 08/02/2023 Prepared by: Gustavus Bryant  Exercises - Seated Long Arc Quad  - 3 x daily - 5 x weekly - 3 sets - 10 reps - 2s hold - Seated Heel Slide  - 3 x daily - 5 x weekly - 3 sets - 10 reps - Standing Heel Raise with Support  - 3 x daily - 5 x weekly - 3 sets - 10 reps  ASSESSMENT:  CLINICAL IMPRESSION: Focus of today was R quad facilitation and contraction, regaining knee flexion ROM and restoring proprioception in joint.  Increased flexion observed as well as improved quad set control over multiple reps.  Patient presents to first follow up PT session reporting continued Rt knee pain and that he has been compliant with his HEP. Session today focused on knee strengthening and ROM exercises. He is somewhat limited by pain throughout session, but is able to complete all exercises. Patient continues to benefit from skilled PT services and should be progressed as able to improve functional independence.    OBJECTIVE IMPAIRMENTS: decreased mobility, difficulty walking, decreased ROM, decreased strength, increased edema, increased fascial restrictions, impaired flexibility, postural dysfunction, obesity, and pain.   ACTIVITY LIMITATIONS: carrying, lifting, bending, standing, squatting, sleeping,  stairs, transfers, and locomotion level  PARTICIPATION LIMITATIONS: shopping and community activity  PERSONAL FACTORS: Fitness and 1 comorbidity: diabetes  are also affecting patient's functional outcome.   REHAB POTENTIAL: Good  CLINICAL DECISION MAKING: Evolving  EVALUATION COMPLEXITY: Low   GOALS:   LONG TERM GOALS: Target date: 10/02/2023      Pt will be independent with home exercise program for lower extremity strength Baseline: [id number] Goal status: INITIAL  2.  Patient will increase  strength to 4 out of 5 bilaterally in order to support knees with functional activity Baseline:  MMT Right eval Left eval  Hip flexion 4- 4  Hip extension    Hip abduction 3 3+  Hip adduction    Hip internal rotation    Hip external rotation    Knee flexion 4+ 5  Knee extension 4-  5   Goal status: INITIAL  3.  Patient will increase 30s chair stand reps from 4 to 8 with/without arms to demonstrate and improved functional ability with less pain/difficulty as well as reduce fall risk.  Baseline: 4 reps Goal status: INITIAL  4.  Patient will be able to walk in the community, shopping with pain no more than 4/10 Baseline: 8-9/10 avg. Goal status: INITIAL  5.   Increase R knee AROM to 0d extension and 125d flexion Baseline: -4d and 92d respectively Goal status: INITIAL  6.  Patient will score at least 43% on FOTO to signify clinically meaningful improvement in functional abilities.   Baseline: 15 Goal status: INITIAL   PLAN:  PT FREQUENCY: 2x/week  PT DURATION: 6 weeks  PLANNED INTERVENTIONS: Therapeutic exercises, Therapeutic activity, Neuromuscular re-education, Balance training, Gait training, Patient/Family education, Self Care, Joint mobilization, Dry Needling, Taping, Ionotophoresis 4mg /ml Dexamethasone, Manual therapy, and Re-evaluation  PLAN FOR NEXT SESSION: NuStep, HEP, gait and functional testing , hip strength   For all possible CPT codes, reference the  Planned Interventions line above.     Check all conditions that are expected to impact treatment: {Conditions expected  to impact treatment:Morbid obesity   If treatment provided at initial evaluation, no treatment charged due to lack of authorization.      Hildred Laser, PT 08/19/2023, 9:10 AM

## 2023-08-20 ENCOUNTER — Ambulatory Visit: Payer: Medicaid Other | Admitting: Student

## 2023-08-20 ENCOUNTER — Ambulatory Visit (HOSPITAL_COMMUNITY)
Admission: RE | Admit: 2023-08-20 | Discharge: 2023-08-20 | Disposition: A | Discharge status: Home / Self Care | Payer: Medicaid Other | Source: Ambulatory Visit | Attending: Internal Medicine | Admitting: Internal Medicine

## 2023-08-20 ENCOUNTER — Telehealth: Payer: Self-pay

## 2023-08-20 ENCOUNTER — Encounter: Payer: Self-pay | Admitting: Student

## 2023-08-20 VITALS — BP 128/84 | HR 78 | Temp 98.5°F | Wt 240.9 lb

## 2023-08-20 DIAGNOSIS — Z794 Long term (current) use of insulin: Secondary | ICD-10-CM | POA: Diagnosis not present

## 2023-08-20 DIAGNOSIS — E1169 Type 2 diabetes mellitus with other specified complication: Secondary | ICD-10-CM | POA: Diagnosis present

## 2023-08-20 DIAGNOSIS — R8290 Unspecified abnormal findings in urine: Secondary | ICD-10-CM

## 2023-08-20 DIAGNOSIS — Z1211 Encounter for screening for malignant neoplasm of colon: Secondary | ICD-10-CM

## 2023-08-20 DIAGNOSIS — M1711 Unilateral primary osteoarthritis, right knee: Secondary | ICD-10-CM | POA: Diagnosis present

## 2023-08-20 DIAGNOSIS — Z7984 Long term (current) use of oral hypoglycemic drugs: Secondary | ICD-10-CM

## 2023-08-20 DIAGNOSIS — Z7985 Long-term (current) use of injectable non-insulin antidiabetic drugs: Secondary | ICD-10-CM | POA: Diagnosis not present

## 2023-08-20 DIAGNOSIS — M25561 Pain in right knee: Secondary | ICD-10-CM | POA: Diagnosis not present

## 2023-08-20 LAB — POCT GLYCOSYLATED HEMOGLOBIN (HGB A1C): Hemoglobin A1C: 10.4 % — AB (ref 4.0–5.6)

## 2023-08-20 LAB — GLUCOSE, CAPILLARY: Glucose-Capillary: 184 mg/dL — ABNORMAL HIGH (ref 70–99)

## 2023-08-20 MED ORDER — OZEMPIC (2 MG/DOSE) 8 MG/3ML ~~LOC~~ SOPN
2.0000 mg | PEN_INJECTOR | SUBCUTANEOUS | 10 refills | Status: DC
Start: 2023-10-14 — End: 2024-07-27

## 2023-08-20 MED ORDER — OZEMPIC (2 MG/DOSE) 8 MG/3ML ~~LOC~~ SOPN
PEN_INJECTOR | SUBCUTANEOUS | 1 refills | Status: AC
Start: 2023-08-20 — End: 2023-10-15

## 2023-08-20 NOTE — Assessment & Plan Note (Signed)
He is curious if he needs more frequent colon cancer screening.  He does not have a family history, at 08/2020 which she said revealed polyps as well as anal dysplasia.  He sees gastroenterology at [facility] and the recommendation was to repeat in 10 years which would be 2031, but he would prefer to have it more often.  He will discuss this with his gastroenterologist.

## 2023-08-20 NOTE — Progress Notes (Cosign Needed Addendum)
CC: Diabetes follow up. Urine odor.  HPI:  [name] is a 57 y.o. male living with a history stated below and presents today for Follow up. Please see problem based assessment and plan for additional details.  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   Hypertension 2009   Well controlled on HCTZ   Hypokalemia 01/12/2019   Knee effusion, right 07/19/2017   Lobar pneumonia (HCC) 01/12/2019   Obesity 10/19/2020   Obstructive sleep apnea 10/19/2020   Otitis media 03/13/2017   Polyuria 03/13/2017   Poorly controlled diabetes mellitus (HCC) 03/13/2017   Type 2 diabetes mellitus (HCC)    Well controlled on metformin   Urine abnormality 07/04/2023   Vaccine counseling 07/04/2023   Weight loss 03/13/2017    Current Outpatient Medications on File Prior to Visit  Medication Sig Dispense Refill   Accu-Chek FastClix Lancets MISC Check blood sugar before meals and after meals 102 each 12   acetaminophen (TYLENOL) 500 MG tablet Take 1,000 mg by mouth every 6 (six) hours as needed for fever.     Blood Glucose Monitoring Suppl (ACCU-CHEK GUIDE) w/Device KIT 1 each by Does not apply route 6 (six) times daily. 1 kit 0   Continuous Glucose Sensor (DEXCOM G7 SENSOR) MISC Change sensor every 10 days or as directed by your provider (for diabetes). 3 each 3   diclofenac Sodium (VOLTAREN) 1 % GEL Apply 4 grams topically 4 (four) times daily as needed 100 g 3   empagliflozin (JARDIANCE) 10 MG TABS tablet Take 1 tablet (10 mg total) by mouth daily before breakfast. 90 tablet 0   emtricitabine-rilpivir-tenofovir AF  (ODEFSEY) 200-25-25 MG TABS tablet Take 1 tablet by mouth daily with breakfast. 30 tablet 11   fexofenadine (ALLEGRA) 180 MG tablet TAKE 1 TABLET(180 MG) BY MOUTH DAILY 30 tablet 5   gabapentin (NEURONTIN) 300 MG capsule Take 1 capsule (300 mg total) by mouth 3 (three) times daily. 90 capsule 2   glucose blood (ACCU-CHEK GUIDE) test strip Check blood sugar before meals and after meals 100 each 12   insulin glargine (LANTUS) 100 UNIT/ML Solostar Pen Inject 30 Units into the skin daily. 15 mL 3   Insulin Pen Needle (PEN NEEDLES) 32G X 4 MM MISC Use daily as directed. 200 each 3   lisinopril (ZESTRIL) 20 MG tablet Take 1 tablet (20 mg total) by mouth daily. 90 tablet 3   metFORMIN (GLUCOPHAGE-XR) 500 MG 24 hr tablet Take 2 tablets (1,000 mg total) by mouth 2 (two) times daily with a meal. 360 tablet 2   metoprolol tartrate (LOPRESSOR) 100 MG tablet Take 1 tablet by mouth two hours prior to scan 1 tablet 0   rosuvastatin (CRESTOR) 20 MG tablet TAKE 1 TABLET(20 MG) BY MOUTH DAILY 90 tablet 3   No current facility-administered medications on file prior to visit.    Family History  Problem Relation Age of Onset   Diabetes Mellitus II Sister    Colon polyps Sister    Diabetes Mellitus II Mother    Coronary artery disease Mother 63  CABGx6   Coronary artery disease Sister 71       CABG   Diabetes Mellitus II Sister    Coronary artery disease Sister    Diabetes Mellitus II Sister    Colon cancer Neg Hx    Esophageal cancer Neg Hx    Rectal cancer Neg Hx    Stomach cancer Neg Hx     Social History   Socioeconomic History   Marital status: Single    Spouse name: Not on file   Number of children: Not on file   Years of education: 13   Highest education level: Not on file  Occupational History    Employer: UNEMPLOYED  Tobacco Use   Smoking status: Former    Current packs/day: 0.00    Types: Cigarettes    Quit date: 2003    Years since quitting: 21.8   Smokeless tobacco: Never   Vaping Use   Vaping status: Never Used  Substance and Sexual Activity   Alcohol use: No    Alcohol/week: 0.0 standard drinks of alcohol    Comment: Last used 2004   Drug use: No    Comment: Last used 2003   Sexual activity: Yes    Partners: Male    Comment: accepted condoms  Other Topics Concern   Not on file  Social History Narrative   On disability. Lives in [place].    Social Determinants of Health   Financial Resource Strain: Medium Risk (12/25/2022)   Overall Financial Resource Strain (CARDIA)    Difficulty of Paying Living Expenses: Somewhat hard  Food Insecurity: Food Insecurity Present (12/25/2022)   Hunger Vital Sign    Worried About Running Out of Food in the Last Year: Sometimes true    Ran Out of Food in the Last Year: Sometimes true  Transportation Needs: No Transportation Needs (12/25/2022)   PRAPARE - Administrator, Civil Service (Medical): No    Lack of Transportation (Non-Medical): No  Physical Activity: Inactive (12/25/2022)   Exercise Vital Sign    Days of Exercise per Week: 1 day    Minutes of Exercise per Session: 0 min  Stress: Stress Concern Present (12/25/2022)   [employer] of Occupational Health - Occupational Stress Questionnaire    Feeling of Stress : To some extent  Social Connections: Moderately Integrated (12/25/2022)   Social Connection and Isolation Panel [NHANES]    Frequency of Communication with Friends and Family: More than three times a week    Frequency of Social Gatherings with Friends and Family: Three times a week    Attends Religious Services: More than 4 times per year    Active Member of Clubs or Organizations: Yes    Attends Banker Meetings: 1 to 4 times per year    Marital Status: Never married  Intimate Partner Violence: Not At Risk (12/25/2022)   Humiliation, Afraid, Rape, and Kick questionnaire    Fear of Current or Ex-Partner: No    Emotionally Abused: No    Physically Abused: No     Sexually Abused: No    Review of Systems: ROS negative except for what is noted on the assessment and plan.  Vitals:   08/20/23 0829  BP: 128/84  Pulse: 78  Temp: 98.5 F (36.9 C)  TempSrc: Oral  Weight: 240 lb 14.4 oz (109.3 kg)    Physical Exam: Constitutional: well-appearing man sitting in chair, in no acute distress HENT: normocephalic atraumatic, mucous membranes moist Eyes: conjunctiva non-erythematous Cardiovascular: regular rate  and rhythm, no m/r/g Pulmonary/Chest: normal work of breathing on room air, lungs clear to auscultation bilaterally Abdominal: soft, non-tender, non-distended MSK: normal bulk and tone Neurological: alert & oriented x 3, no focal deficit Skin: warm and dry Psych: normal mood and behavior  Assessment & Plan:   Patient seen with Dr. Cleda Daub  Type 2 diabetes mellitus with other specified complication (HCC) Today A1c is 10.4, which is an improvement from 11.3 earlier this summer.  Currently he is taking Jardiance 10, metformin 1000 twice daily, and Lantus 30 at night.  He had been on Victoza 1.8 and tolerating it well, but for some reason it was stopped about a month ago, and I cannot tell why.  He did not report intolerance.  We have discussed wearing CGM but he declines, as it increases his anxiety. Plan: Continue above, in place of Victoza I will start Ozempic.  He will do 0.5 mg weekly x 4-week, then 1 mg x 4-week, then 2 mg indefinitely. - Return to clinic in 4 to 5 months for repeat A1c.  Urine abnormality He reports pungent urine odor intermittently that causes him distress.  Currently no pain, burning, changes in frequency or appearance of urine.  He is adamant that he would like to see a urologist for this issue, and had seen one in [facility], but would like to see 1 in [place] closer to where he lives.  Without other symptoms, I suspect the cause is related to his diabetes and perhaps influenced by his Jardiance, with increased  glucose in the urine.  Will however check a urinalysis. Plan: UA today and refer to urology  Knee pain Is working with physical therapy regarding right knee osteoarthritis.  He still has intermittent pain but overall feels that his condition is improving.  He uses Voltaren gel for relief.  He was told by his physical therapist that this would like updated imaging for concern of increased swelling. Plan: Right knee x-ray  Colon cancer screening He is curious if he needs more frequent colon cancer screening.  He does not have a family history, at 08/2020 which she said revealed polyps as well as anal dysplasia.  He sees gastroenterology at [facility] and the recommendation was to repeat in 10 years which would be 2031, but he would prefer to have it more often.  He will discuss this with his gastroenterologist.  Wayne Wolfe, D.O. [facility] Internal Medicine, PGY-1 Phone: [phone] Date 08/20/2023 Time 9:46 AM

## 2023-08-20 NOTE — Telephone Encounter (Signed)
Decision:Approved Wayne Wolfe (Key: [id number]) Rx #: [id number] Ozempic (2 MG/DOSE) 8MG Wayne Wolfe pen-injectors Form [employer] Electronic Prior Authorization Form [phone] NCPDP) Created Message from Plan Request Reference Number: [id number]. OZEMPIC INJ 8MG /3ML is approved through 08/19/2024. For further questions, call [employer] at [phone].Marland Kitchen Authorization Expiration Date: August 19, 2024.

## 2023-08-20 NOTE — Assessment & Plan Note (Signed)
Today A1c is 10.4, which is an improvement from 11.3 earlier this summer.  Currently he is taking Jardiance 10, metformin 1000 twice daily, and Lantus 30 at night.  He had been on Victoza 1.8 and tolerating it well, but for some reason it was stopped about a month ago, and I cannot tell why.  He did not report intolerance.  We have discussed wearing CGM but he declines, as it increases his anxiety. Plan: Continue above, in place of Victoza I will start Ozempic.  He will do 0.5 mg weekly x 4-week, then 1 mg x 4-week, then 2 mg indefinitely. - Return to clinic in 4 to 5 months for repeat A1c.

## 2023-08-20 NOTE — Patient Instructions (Addendum)
Mr Eells,  Today we discussed you diabetes, knee pain, and urine.  For diabetes, lets start Ozempic. It is a weekly injectable medicine. You will do 0.5mg  weekly x 4 weeks. Then 1mg  x 4 weeks. Then 2mg  weekly from there. You may have stomach cramping, diarrhea as you start and increase doses. In time, this should resolve. If it becomes intolerable, reach out to the clinic and we can change medicines.  For knee pain, we will get an XR. Keep working with PT, they are excellent at dealing with arthritis.  For the urine odor, it may simply be due to diabetes, as increased sugar overflows into urine and can cause a smell. To be safe, we will get a urinalysis and send you to a urologist.  Regarding your colonoscopy, reach out to your gastroenterologist at [facility].  Return in 3 months please for a diabetes/A1c check.  Best regards, Dr. Ninfa Meeker

## 2023-08-20 NOTE — Telephone Encounter (Signed)
Prior Authorization for patient (Ozempic (2 MG/DOSE) 8MG /3ML pen-injectors) came through on cover my meds was submitted with last office notes and labs awaiting approval or denial.  ZOX:[id number]

## 2023-08-20 NOTE — Assessment & Plan Note (Signed)
He reports pungent urine odor intermittently that causes him distress.  Currently no pain, burning, changes in frequency or appearance of urine.  He is adamant that he would like to see a urologist for this issue, and had seen one in [facility], but would like to see 1 in [place] closer to where he lives.  Without other symptoms, I suspect the cause is related to his diabetes and perhaps influenced by his Jardiance, with increased glucose in the urine.  Will however check a urinalysis. Plan: UA today and refer to urology

## 2023-08-20 NOTE — Assessment & Plan Note (Signed)
Is working with physical therapy regarding right knee osteoarthritis.  He still has intermittent pain but overall feels that his condition is improving.  He uses Voltaren gel for relief.  He was told by his physical therapist that this would like updated imaging for concern of increased swelling. Plan: Right knee x-ray

## 2023-08-21 ENCOUNTER — Ambulatory Visit: Payer: Medicaid Other

## 2023-08-21 LAB — URINALYSIS, ROUTINE W REFLEX MICROSCOPIC
Bilirubin, UA: NEGATIVE
Ketones, UA: NEGATIVE
Leukocytes,UA: NEGATIVE
Nitrite, UA: NEGATIVE
RBC, UA: NEGATIVE
Specific Gravity, UA: 1.028 (ref 1.005–1.030)
Urobilinogen, Ur: 0.2 mg/dL (ref 0.2–1.0)
pH, UA: 5.5 (ref 5.0–7.5)

## 2023-08-23 NOTE — Therapy (Unsigned)
OUTPATIENT PHYSICAL THERAPY TREATMENT NOTE   Patient Name: Wayne Wolfe MRN: [id number] DOB:[birth year], 57 y.o., male Today's Date: 08/26/2023  END OF SESSION:  PT End of Session - 08/26/23 0828     Visit Number 4    Number of Visits 12    Date for PT Re-Evaluation 10/02/23    Authorization Type [employer]    Authorization Time Period 27 VL    Authorization - Number of Visits 27    PT Start Time 0830    PT Stop Time 0910    PT Time Calculation (min) 40 min    Activity Tolerance Patient tolerated treatment well;Patient limited by pain    Behavior During Therapy [facility] for tasks assessed/performed              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   Hypertension 2009   Well controlled on HCTZ   Hypokalemia 01/12/2019   Knee effusion, right 07/19/2017   Lobar pneumonia (HCC) 01/12/2019   Obesity 10/19/2020   Obstructive sleep apnea 10/19/2020   Otitis media 03/13/2017   Polyuria 03/13/2017   Poorly controlled diabetes mellitus (HCC) 03/13/2017   Type 2 diabetes mellitus (HCC)    Well controlled on metformin   Urine abnormality 07/04/2023   Vaccine counseling 07/04/2023   Weight loss 03/13/2017   Past Surgical History:  Procedure Laterality Date   CARPAL TUNNEL RELEASE Right 1990s   none     Patient Active Problem List   Diagnosis Date Noted   Vaccine counseling 07/04/2023   Urine abnormality 07/04/2023   Chest pain 12/25/2022   Knee pain 06/20/2022   Environmental allergies 02/16/2022   Healthcare maintenance 12/04/2021   Obstructive sleep apnea 10/19/2020   Morbid obesity (HCC)  10/19/2020   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   Multiple joint pain 02/11/2020   Syphilis 12/14/2019   Unprotected sexual intercourse 12/01/2019   Pulmonary nodule 12/26/2017   Routine screening for STI (sexually transmitted infection) 10/16/2017   Otitis media 03/13/2017   Polyuria 03/13/2017   Colon cancer screening 02/24/2016   Testicular pain 01/20/2016   Numbness and tingling of both feet 08/26/2013   Keloid of skin 06/25/2012   Type 2 diabetes mellitus with other specified complication (HCC) 03/12/2011   Hyperlipidemia associated with type 2 diabetes mellitus (HCC) 03/12/2011   Hypertension associated with diabetes (HCC) 04/20/2008   Human immunodeficiency virus (HIV) disease (HCC) 04/15/2008    PCP: Earl Lagos, MD  REFERRING PROVIDER: Dickie La MD  REFERRING DIAG: M25.561 (ICD-10-CM) - Acute pain of right knee  THERAPY DIAG:  Chronic pain of right knee  Muscle weakness (generalized)  Rationale for Evaluation and Treatment: Rehabilitation  ONSET DATE: 3-6 mos   SUBJECTIVE:   SUBJECTIVE STATEMENT: Had imaging studies performed last week.   PERTINENT HISTORY: 6 mos + PAIN:  Are you having pain? Yes: NPRS scale: 7/10 Pain location: Rt medial  Pain description: sharp stabbing burn, ache  Aggravating factors: walking, bending, activity Relieving factors: ice, tylenol   PRECAUTIONS: Other: none   RED FLAGS: None   WEIGHT BEARING RESTRICTIONS: No  FALLS:  Has patient  fallen in last 6 months? No  LIVING ENVIRONMENT: Lives with: lives alone Lives in: House/apartment Stairs: Yes: External: 3 steps; on right going up Has following equipment at home: None  OCCUPATION: Not working , has had multiple jobs in various fields   PLOF: Independent, family and friends, sports events, shopping  PATIENT GOALS: I want to be able to move better.  Get rid of this pain.   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  MUSCLE LENGTH: Tightness in hips ER and IR   POSTURE:  genu varum   PALPATION: Pain medial joint line in Rt LE Joint effusion/+ bounce home  LOWER EXTREMITY ROM:  A/PROM Right eval Left eval  Hip flexion    Hip extension    Hip abduction    Hip adduction    Hip internal rotation    Hip external rotation    Knee flexion 92 124  Knee extension -4/0d 0  Ankle dorsiflexion    Ankle plantarflexion    Ankle inversion    Ankle eversion     (Blank rows = not tested)  LOWER EXTREMITY MMT:  MMT Right eval Left eval  Hip flexion 4- 4  Hip extension    Hip abduction 3 3+  Hip adduction    Hip internal rotation    Hip external rotation    Knee flexion 4+ 5  Knee extension 4-  5  Ankle dorsiflexion    Ankle plantarflexion    Ankle inversion    Ankle eversion     (Blank rows = not tested)  LOWER EXTREMITY SPECIAL TESTS:  Knee neg for M/L stability testing  Poor quad set ability Joint effusion observed  FUNCTIONAL TESTS:  30s chair stand test 4 reps   GAIT: Distance walked: 38ft x2 Assistive device utilized: None Level of assistance: Complete Independence Comments: mild antalgia   TODAY'S TREATMENT:        [facility] Adult PT Treatment:                                                DATE: 08/26/23 Therapeutic Exercise: Nustep L3 8 min QS 3s 15x SLR 3# 15x SAQs 3# 15x Heel slides with strap 15x FAQs with adduction 3# 15x Supine hip fallouts BluTB 15x S/L R clams BluTB 15x Heel raises against wall 15x March against wall 15/15 to promote SLS and knee flexion  TKE BluTB 15x   [facility] Adult PT Treatment:                                                DATE: 08/19/23 Therapeutic Exercise: Nustep L2 8 min QS 3s 15x SLR 2# 15x SAQs 2# 15x Heel slides with strap 15x FAQs with adduction 15x Heel raises against wall 15x March  against wall 15/15 to promote SLS and knee flexion  TKE GTB 15x   [facility] Adult PT Treatment:  DATE: 08/15/23 Therapeutic Exercise: Nustep level 5 x 5 mins while gathering subjective and planning session with patient Standing hip abduction/extension x10 BIL Standing heel raises 2x10 Standing toe raises 2x10 Standing knee flexion stretch on 10" stacked steps x10 Omega knee extension 5# RLE only x10, x5 Omega knee flexion 15# x10, x5 Seated hamstring stretch 2x30" Rt STS x5 using UE on knees                                                                                                                         DATE: 08/02/23 Eval   PATIENT EDUCATION:  Education details: POC, HEP and support of knee with improved hip mobility and strength  Person educated: Patient Education method: Explanation Education comprehension: verbalized understanding and returned demonstration  HOME EXERCISE PROGRAM: Access Code: [email or web address] URL: [email or web address] Date: 08/02/2023 Prepared by: Gustavus Bryant  Exercises - Seated Long Arc Quad  - 3 x daily - 5 x weekly - 3 sets - 10 reps - 2s hold - Seated Heel Slide  - 3 x daily - 5 x weekly - 3 sets - 10 reps - Standing Heel Raise with Support  - 3 x daily - 5 x weekly - 3 sets - 10 reps  ASSESSMENT:  CLINICAL IMPRESSION: Underwent Xrays last week, results unknown.  Continued exercises but increased weight and resistance as noted.  Incorporated hip abduction with fallouts and clams.  Cued for proper form and pace for all tasks.  Patient presents to first follow up PT session reporting continued Rt knee pain and that he has been compliant with his HEP. Session today focused on knee strengthening and ROM exercises. He is somewhat limited by pain throughout session, but is able to complete all exercises. Patient continues to benefit from skilled PT services and should be progressed as able to improve  functional independence.    OBJECTIVE IMPAIRMENTS: decreased mobility, difficulty walking, decreased ROM, decreased strength, increased edema, increased fascial restrictions, impaired flexibility, postural dysfunction, obesity, and pain.   ACTIVITY LIMITATIONS: carrying, lifting, bending, standing, squatting, sleeping, stairs, transfers, and locomotion level  PARTICIPATION LIMITATIONS: shopping and community activity  PERSONAL FACTORS: Fitness and 1 comorbidity: diabetes  are also affecting patient's functional outcome.   REHAB POTENTIAL: Good  CLINICAL DECISION MAKING: Evolving  EVALUATION COMPLEXITY: Low   GOALS:   LONG TERM GOALS: Target date: 10/02/2023      Pt will be independent with home exercise program for lower extremity strength Baseline: [email or web address] Goal status: Met  2.  Patient will increase  strength to 4 out of 5 bilaterally in order to support knees with functional activity Baseline:  MMT Right eval Left eval  Hip flexion 4- 4  Hip extension    Hip abduction 3 3+  Hip adduction    Hip internal rotation    Hip external rotation    Knee flexion 4+ 5  Knee extension 4-  5   Goal status: INITIAL  3.  Patient will increase 30s chair  stand reps from 4 to 8 with/without arms to demonstrate and improved functional ability with less pain/difficulty as well as reduce fall risk.  Baseline: 4 reps Goal status: INITIAL  4.  Patient will be able to walk in the community, shopping with pain no more than 4/10 Baseline: 8-9/10 avg. Goal status: INITIAL  5.   Increase R knee AROM to 0d extension and 125d flexion Baseline: -4d and 92d respectively Goal status: INITIAL  6.  Patient will score at least 43% on FOTO to signify clinically meaningful improvement in functional abilities.   Baseline: 15 Goal status: INITIAL   PLAN:  PT FREQUENCY: 2x/week  PT DURATION: 6 weeks  PLANNED INTERVENTIONS: Therapeutic exercises, Therapeutic activity, Neuromuscular  re-education, Balance training, Gait training, Patient/Family education, Self Care, Joint mobilization, Dry Needling, Taping, Ionotophoresis 4mg /ml Dexamethasone, Manual therapy, and Re-evaluation  PLAN FOR NEXT SESSION: NuStep, HEP, gait and functional testing , hip strength   For all possible CPT codes, reference the Planned Interventions line above.     Check all conditions that are expected to impact treatment: {Conditions expected to impact treatment:Morbid obesity   If treatment provided at initial evaluation, no treatment charged due to lack of authorization.      Hildred Laser, PT 08/26/2023, 9:01 AM

## 2023-08-26 ENCOUNTER — Telehealth: Payer: Self-pay | Admitting: *Deleted

## 2023-08-26 ENCOUNTER — Ambulatory Visit: Payer: Medicaid Other

## 2023-08-26 DIAGNOSIS — G8929 Other chronic pain: Secondary | ICD-10-CM

## 2023-08-26 DIAGNOSIS — M25561 Pain in right knee: Secondary | ICD-10-CM | POA: Diagnosis not present

## 2023-08-26 DIAGNOSIS — M6281 Muscle weakness (generalized): Secondary | ICD-10-CM

## 2023-08-26 NOTE — Progress Notes (Signed)
Internal Medicine Clinic Attending  I was physically present during the key portions of the resident provided service and participated in the medical decision making of patient's management care. I reviewed pertinent patient test results.  The assessment, diagnosis, and plan were formulated together and I agree with the documentation in the resident's note.  Gust Rung, DO

## 2023-08-28 ENCOUNTER — Ambulatory Visit: Payer: Medicaid Other

## 2023-09-02 ENCOUNTER — Ambulatory Visit: Payer: Medicaid Other

## 2023-09-03 NOTE — Therapy (Signed)
OUTPATIENT PHYSICAL THERAPY TREATMENT NOTE   Patient Name: Wayne Wolfe MRN: [id number] DOB:1965-12-02, 57 y.o., male Today's Date: 09/04/2023  END OF SESSION:  PT End of Session - 09/04/23 0822     Visit Number 5    Number of Visits 12    Date for PT Re-Evaluation 10/02/23    Authorization Type M[employer]    Authorization Time Period 27 VL    Authorization - Number of Visits 27    PT Start Time 0830    PT Stop Time 0910    PT Time Calculation (min) 40 min    Activity Tolerance Patient tolerated treatment well;Patient limited by pain    Behavior During Therapy [facility] for tasks assessed/performed             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   Hypertension 2009   Well controlled on HCTZ   Hypokalemia 01/12/2019   Knee effusion, right 07/19/2017   Lobar pneumonia (HCC) 01/12/2019   Obesity 10/19/2020   Obstructive sleep apnea 10/19/2020   Otitis media 03/13/2017   Polyuria 03/13/2017   Poorly controlled diabetes mellitus (HCC) 03/13/2017   Type 2 diabetes mellitus (HCC)    Well controlled on metformin   Urine abnormality 07/04/2023   Vaccine counseling 07/04/2023   Weight loss 03/13/2017   Past Surgical History:  Procedure Laterality Date   CARPAL TUNNEL RELEASE Right 1990s   none     Patient Active Problem List   Diagnosis Date Noted   Vaccine counseling 07/04/2023   Urine abnormality 07/04/2023   Chest pain 12/25/2022   Knee pain 06/20/2022   Environmental allergies 02/16/2022   Healthcare maintenance 12/04/2021   Obstructive sleep apnea 10/19/2020   Morbid obesity (HCC)  10/19/2020   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   Multiple joint pain 02/11/2020   Syphilis 12/14/2019   Unprotected sexual intercourse 12/01/2019   Pulmonary nodule 12/26/2017   Routine screening for STI (sexually transmitted infection) 10/16/2017   Otitis media 03/13/2017   Polyuria 03/13/2017   Colon cancer screening 02/24/2016   Testicular pain 01/20/2016   Numbness and tingling of both feet 08/26/2013   Keloid of skin 06/25/2012   Type 2 diabetes mellitus with other specified complication (HCC) 03/12/2011   Hyperlipidemia associated with type 2 diabetes mellitus (HCC) 03/12/2011   Hypertension associated with diabetes (HCC) 04/20/2008   Human immunodeficiency virus (HIV) disease (HCC) 04/15/2008    PCP: Earl Lagos, MD  REFERRING PROVIDER: Dickie La MD  REFERRING DIAG: M25.561 (ICD-10-CM) - Acute pain of right knee  THERAPY DIAG:  Chronic pain of right knee  Muscle weakness (generalized)  Rationale for Evaluation and Treatment: Rehabilitation  ONSET DATE: 3-6 mos   SUBJECTIVE:   SUBJECTIVE STATEMENT: Still has not heard back about his results from his xrays.    PERTINENT HISTORY: 6 mos + PAIN:  Are you having pain? Yes: NPRS scale: 7/10 Pain location: Rt medial  Pain description: sharp stabbing burn, ache  Aggravating factors: walking, bending, activity Relieving factors: ice, tylenol   PRECAUTIONS: Other: none   RED FLAGS: None   WEIGHT BEARING RESTRICTIONS: No  FALLS:  Has patient fallen in last 6 months? No  LIVING ENVIRONMENT: Lives with: lives alone Lives in: House/apartment Stairs: Yes: External: 3 steps; on right going up Has following equipment at home: None  OCCUPATION: Not working , has had multiple jobs in various fields   PLOF: Independent, family and friends, sports events, shopping  PATIENT GOALS: I want to be able to move better.  Get rid of this pain.   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  MUSCLE LENGTH: Tightness in hips ER and IR   POSTURE:  genu varum   PALPATION: Pain medial joint line in Rt LE Joint effusion/+ bounce home  LOWER EXTREMITY ROM:  A/PROM Right eval Left eval  Hip flexion    Hip extension    Hip abduction    Hip adduction    Hip internal rotation    Hip external rotation    Knee flexion 92 124  Knee extension -4/0d 0  Ankle dorsiflexion    Ankle plantarflexion    Ankle inversion    Ankle eversion     (Blank rows = not tested)  LOWER EXTREMITY MMT:  MMT Right eval Left eval  Hip flexion 4- 4  Hip extension    Hip abduction 3 3+  Hip adduction    Hip internal rotation    Hip external rotation    Knee flexion 4+ 5  Knee extension 4-  5  Ankle dorsiflexion    Ankle plantarflexion    Ankle inversion    Ankle eversion     (Blank rows = not tested)  LOWER EXTREMITY SPECIAL TESTS:  Knee neg for M/L stability testing  Poor quad set ability Joint effusion observed  FUNCTIONAL TESTS:  30s chair stand test 4 reps   GAIT: Distance walked: 50ft x2 Assistive device utilized: None Level of assistance: Complete Independence Comments: mild antalgia   TODAY'S TREATMENT:        [facility] Adult PT Treatment:                                                DATE: 09/04/23 Therapeutic Exercise: Nustep L4 5 min Standing hip abduction 2x10 BIL Step ups 6" fwd/lat x10 ea BIL Heel/toe raises 2x10 March at counter 15/15 to promote SLS and knee flexion  QS 3s 15x SLR 3# 10x SAQs 3# 15x FAQs with adduction 3# 15x TKE BluTB 15x    [facility] Adult PT Treatment:                                                DATE: 08/26/23 Therapeutic Exercise: Nustep L3 8 min QS 3s 15x SLR 3# 15x SAQs 3# 15x Heel slides with strap 15x FAQs with adduction 3# 15x Supine hip fallouts BluTB 15x S/L R clams  BluTB 15x Heel raises against wall 15x March against wall 15/15 to promote SLS and knee flexion  TKE BluTB 15x   [facility] Adult PT Treatment:  DATE: 08/19/23 Therapeutic Exercise: Nustep L2 8 min QS 3s 15x SLR 2# 15x SAQs 2# 15x Heel slides with strap 15x FAQs with adduction 15x Heel raises against wall 15x March against wall 15/15 to promote SLS and knee flexion  TKE GTB 15x                                                                                                                           PATIENT EDUCATION:  Education details: POC, HEP and support of knee with improved hip mobility and strength  Person educated: Patient Education method: Explanation Education comprehension: verbalized understanding and returned demonstration  HOME EXERCISE PROGRAM: Access Code: [email or web address] URL: [email or web address] Date: 08/02/2023 Prepared by: Gustavus Bryant  Exercises - Seated Long Arc Quad  - 3 x daily - 5 x weekly - 3 sets - 10 reps - 2s hold - Seated Heel Slide  - 3 x daily - 5 x weekly - 3 sets - 10 reps - Standing Heel Raise with Support  - 3 x daily - 5 x weekly - 3 sets - 10 reps  ASSESSMENT:  CLINICAL IMPRESSION:  Patient presents to PT reporting continued knee pain and that he is still awaiting results from his xrays. Session today continued to focus on LE strengthening with introduction of more standing exercises today to good effect, minimal increase in Rt knee pain throughout. Patient was able to tolerate all prescribed exercises with no adverse effects. Patient continues to benefit from skilled PT services and should be progressed as able to improve functional independence.    Patient presents to first follow up PT session reporting continued Rt knee pain and that he has been compliant with his HEP. Session today focused on knee strengthening and ROM exercises. He is somewhat limited by pain throughout session, but  is able to complete all exercises. Patient continues to benefit from skilled PT services and should be progressed as able to improve functional independence.    OBJECTIVE IMPAIRMENTS: decreased mobility, difficulty walking, decreased ROM, decreased strength, increased edema, increased fascial restrictions, impaired flexibility, postural dysfunction, obesity, and pain.   ACTIVITY LIMITATIONS: carrying, lifting, bending, standing, squatting, sleeping, stairs, transfers, and locomotion level  PARTICIPATION LIMITATIONS: shopping and community activity  PERSONAL FACTORS: Fitness and 1 comorbidity: diabetes  are also affecting patient's functional outcome.   REHAB POTENTIAL: Good  CLINICAL DECISION MAKING: Evolving  EVALUATION COMPLEXITY: Low   GOALS:   LONG TERM GOALS: Target date: 10/02/2023      Pt will be independent with home exercise program for lower extremity strength Baseline: [email or web address] Goal status: Met  2.  Patient will increase  strength to 4 out of 5 bilaterally in order to support knees with functional activity Baseline:  MMT Right eval Left eval  Hip flexion 4- 4  Hip extension    Hip abduction 3 3+  Hip adduction    Hip internal rotation    Hip external rotation    Knee flexion 4+ 5  Knee extension 4-  5   Goal status: INITIAL  3.  Patient will increase 30s chair stand reps from 4 to 8 with/without arms to demonstrate and improved functional ability with less pain/difficulty as well as reduce fall risk.  Baseline: 4 reps Goal status: INITIAL  4.  Patient will be able to walk in the community, shopping with pain no more than 4/10 Baseline: 8-9/10 avg. Goal status: INITIAL  5.   Increase R knee AROM to 0d extension and 125d flexion Baseline: -4d and 92d respectively Goal status: INITIAL  6.  Patient will score at least 43% on FOTO to signify clinically meaningful improvement in functional abilities.   Baseline: 15 Goal status: INITIAL   PLAN:  PT  FREQUENCY: 2x/week  PT DURATION: 6 weeks  PLANNED INTERVENTIONS: Therapeutic exercises, Therapeutic activity, Neuromuscular re-education, Balance training, Gait training, Patient/Family education, Self Care, Joint mobilization, Dry Needling, Taping, Ionotophoresis 4mg /ml Dexamethasone, Manual therapy, and Re-evaluation  PLAN FOR NEXT SESSION: NuStep, HEP, gait and functional testing , hip strength   For all possible CPT codes, reference the Planned Interventions line above.     Check all conditions that are expected to impact treatment: {Conditions expected to impact treatment:Morbid obesity   If treatment provided at initial evaluation, no treatment charged due to lack of authorization.      Berta Minor, PTA 09/04/2023, 9:11 AM

## 2023-09-04 ENCOUNTER — Ambulatory Visit: Payer: Medicaid Other | Attending: Internal Medicine

## 2023-09-04 DIAGNOSIS — G8929 Other chronic pain: Secondary | ICD-10-CM | POA: Diagnosis present

## 2023-09-04 DIAGNOSIS — M6281 Muscle weakness (generalized): Secondary | ICD-10-CM | POA: Diagnosis present

## 2023-09-04 DIAGNOSIS — M25561 Pain in right knee: Secondary | ICD-10-CM | POA: Diagnosis present

## 2023-09-06 NOTE — Therapy (Incomplete)
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   Hypertension 2009   Well controlled on HCTZ   Hypokalemia 01/12/2019   Knee effusion, right 07/19/2017   Lobar pneumonia (HCC) 01/12/2019   Obesity 10/19/2020   Obstructive sleep apnea 10/19/2020   Otitis media 03/13/2017   Polyuria 03/13/2017   Poorly controlled diabetes mellitus (HCC) 03/13/2017   Type 2 diabetes mellitus (HCC)    Well controlled on metformin   Urine abnormality 07/04/2023   Vaccine counseling 07/04/2023   Weight loss 03/13/2017   Past Surgical History:  Procedure Laterality Date   CARPAL TUNNEL RELEASE Right 1990s   none     Patient Active Problem List   Diagnosis Date Noted   Vaccine counseling 07/04/2023   Urine abnormality 07/04/2023   Chest pain 12/25/2022   Knee pain 06/20/2022   Environmental allergies 02/16/2022   Healthcare maintenance 12/04/2021   Obstructive sleep apnea 10/19/2020   Morbid obesity (HCC) 10/19/2020   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   Multiple joint pain 02/11/2020   Syphilis 12/14/2019   Unprotected sexual intercourse 12/01/2019   Pulmonary nodule 12/26/2017   Routine screening for STI (sexually transmitted infection) 10/16/2017   Otitis media 03/13/2017   Polyuria 03/13/2017   Colon cancer screening 02/24/2016   Testicular pain 01/20/2016   Numbness and tingling of both feet 08/26/2013   Keloid of skin 06/25/2012   Type 2 diabetes mellitus  with other specified complication (HCC) 03/12/2011   Hyperlipidemia associated with type 2 diabetes mellitus (HCC) 03/12/2011   Hypertension associated with diabetes (HCC) 04/20/2008   Human immunodeficiency virus (HIV) disease (HCC) 04/15/2008    PCP: Earl Lagos, MD  REFERRING PROVIDER: Dickie La MD  REFERRING DIAG: M25.561 (ICD-10-CM) - Acute pain of right knee  THERAPY DIAG:  No diagnosis found.  Rationale for Evaluation and Treatment: Rehabilitation  ONSET DATE: 3-6 mos   SUBJECTIVE:   SUBJECTIVE STATEMENT: *** Still has not heard back about his results from his xrays.    PERTINENT HISTORY: 6 mos + PAIN:  Are you having pain? Yes: NPRS scale: 7/10 Pain location: Rt medial  Pain description: sharp stabbing burn, ache  Aggravating factors: walking, bending, activity Relieving factors: ice, tylenol   PRECAUTIONS: Other: none   RED FLAGS: None   WEIGHT BEARING RESTRICTIONS: No  FALLS:  Has patient fallen in last 6 months? No  LIVING ENVIRONMENT: Lives with: lives alone Lives in: House/apartment Stairs: Yes: External: 3 steps; on right going up Has following equipment at home: None  OCCUPATION: Not working , has had multiple jobs in various fields   PLOF: Independent, family and friends, sports events, shopping  PATIENT GOALS: I want to be able to move better.  Get rid of this pain.   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  MUSCLE LENGTH: Tightness in hips ER and IR   POSTURE:  genu varum   PALPATION: Pain medial joint line in Rt LE Joint effusion/+ bounce home  LOWER EXTREMITY ROM:  A/PROM Right eval Left eval  Hip flexion    Hip extension    Hip abduction    Hip adduction    Hip internal rotation    Hip external rotation    Knee flexion 92  124  Knee extension -4/0d 0  Ankle dorsiflexion    Ankle plantarflexion    Ankle inversion    Ankle eversion     (Blank rows = not tested)  LOWER EXTREMITY MMT:  MMT Right eval Left eval  Hip flexion 4- 4  Hip extension    Hip abduction 3 3+  Hip adduction    Hip internal rotation    Hip external rotation    Knee flexion 4+ 5  Knee extension 4-  5  Ankle dorsiflexion    Ankle plantarflexion    Ankle inversion    Ankle eversion     (Blank rows = not tested)  LOWER EXTREMITY SPECIAL TESTS:  Knee neg for M/L stability testing  Poor quad set ability Joint effusion observed  FUNCTIONAL TESTS:  30s chair stand test 4 reps   GAIT: Distance walked: 39ft x2 Assistive device utilized: None Level of assistance: Complete Independence Comments: mild antalgia   TODAY'S TREATMENT:        [facility] Adult PT Treatment:                                                DATE: 09/07/23 Therapeutic Exercise: Nustep L5 5 min Standing hip abduction 2x10 BIL Step ups 6" fwd/lat x10 ea BIL Heel/toe raises 2x10 March at counter 15/15 to promote SLS and knee flexion  QS 3s 15x SLR 3# 10x SAQs 3# 15x FAQs with adduction 3# 15x TKE BluTB 15x    [facility] Adult PT Treatment:                                                DATE: 09/04/23 Therapeutic Exercise: Nustep L4 5 min Standing hip abduction 2x10 BIL Step ups 6" fwd/lat x10 ea BIL Heel/toe raises 2x10 March at counter 15/15 to promote SLS and knee flexion  QS 3s 15x SLR 3# 10x SAQs 3# 15x FAQs with adduction 3# 15x TKE BluTB 15x    [facility] Adult PT Treatment:                                                DATE: 08/26/23 Therapeutic Exercise: Nustep L3 8 min QS 3s 15x SLR 3# 15x SAQs 3# 15x Heel slides with strap 15x FAQs with adduction 3# 15x Supine hip fallouts BluTB 15x S/L R clams BluTB 15x Heel raises against wall 15x March against wall 15/15 to promote SLS and knee flexion  TKE BluTB 15x  PATIENT EDUCATION:  Education details: POC, HEP and support of knee with improved hip mobility and strength  Person educated: Patient Education method: Explanation Education comprehension: verbalized understanding and returned demonstration  HOME EXERCISE PROGRAM: Access Code: [email or web address] URL: [email or web address] Date: 08/02/2023 Prepared by: Gustavus Bryant  Exercises - Seated Long Arc Quad  - 3 x daily - 5 x weekly - 3 sets - 10 reps - 2s hold - Seated Heel Slide  - 3 x daily - 5 x weekly - 3 sets - 10 reps - Standing Heel Raise with Support  - 3 x daily - 5 x weekly - 3 sets - 10 reps  ASSESSMENT:  CLINICAL IMPRESSION:  ***  Patient presents to PT reporting continued knee pain and that he is still awaiting results from his xrays. Session today continued to focus on LE strengthening with introduction of more standing exercises today to good effect, minimal increase in Rt knee pain throughout. Patient was able to tolerate all prescribed exercises with no adverse effects. Patient continues to benefit from skilled PT services and should be progressed as able to improve functional independence.    Patient presents to first follow up PT session reporting continued Rt knee pain and that he has been compliant with his HEP. Session today focused on knee strengthening and ROM exercises. He is somewhat limited by pain throughout session, but is able to complete all exercises. Patient continues to benefit from skilled PT services and should be progressed as able to improve functional independence.    OBJECTIVE IMPAIRMENTS: decreased mobility, difficulty walking, decreased ROM, decreased strength, increased edema, increased fascial restrictions, impaired flexibility, postural dysfunction, obesity, and pain.   ACTIVITY LIMITATIONS: carrying, lifting, bending, standing, squatting, sleeping, stairs,  transfers, and locomotion level  PARTICIPATION LIMITATIONS: shopping and community activity  PERSONAL FACTORS: Fitness and 1 comorbidity: diabetes  are also affecting patient's functional outcome.   REHAB POTENTIAL: Good  CLINICAL DECISION MAKING: Evolving  EVALUATION COMPLEXITY: Low   GOALS:   LONG TERM GOALS: Target date: 10/02/2023      Pt will be independent with home exercise program for lower extremity strength Baseline: [email or web address] Goal status: Met  2.  Patient will increase  strength to 4 out of 5 bilaterally in order to support knees with functional activity Baseline:  MMT Right eval Left eval  Hip flexion 4- 4  Hip extension    Hip abduction 3 3+  Hip adduction    Hip internal rotation    Hip external rotation    Knee flexion 4+ 5  Knee extension 4-  5   Goal status: INITIAL  3.  Patient will increase 30s chair stand reps from 4 to 8 with/without arms to demonstrate and improved functional ability with less pain/difficulty as well as reduce fall risk.  Baseline: 4 reps Goal status: INITIAL  4.  Patient will be able to walk in the community, shopping with pain no more than 4/10 Baseline: 8-9/10 avg. Goal status: INITIAL  5.   Increase R knee AROM to 0d extension and 125d flexion Baseline: -4d and 92d respectively Goal status: INITIAL  6.  Patient will score at least 43% on FOTO to signify clinically meaningful improvement in functional abilities.   Baseline: 15 Goal status: INITIAL   PLAN:  PT FREQUENCY: 2x/week  PT DURATION: 6 weeks  PLANNED INTERVENTIONS: Therapeutic exercises, Therapeutic activity, Neuromuscular re-education, Balance training, Gait training, Patient/Family education, Self Care, Joint mobilization, Dry Needling, Taping, Ionotophoresis 4mg /ml Dexamethasone, Manual therapy, and Re-evaluation  PLAN  FOR NEXT SESSION: NuStep, HEP, gait and functional testing , hip strength   For all possible CPT codes, reference the Planned  Interventions line above.     Check all conditions that are expected to impact treatment: {Conditions expected to impact treatment:Morbid obesity   If treatment provided at initial evaluation, no treatment charged due to lack of authorization.      Berta Minor, PTA 09/06/2023, 1:17 PM

## 2023-09-07 ENCOUNTER — Telehealth: Payer: Self-pay

## 2023-09-07 ENCOUNTER — Ambulatory Visit: Payer: Medicaid Other

## 2023-09-07 NOTE — Telephone Encounter (Signed)
LVM regarding missed appointment. Confirmed next appointment time and reminded of clinic attendance policy.   1st no-show  Wayne Wolfe, [place] 09/07/23 10:36 AM

## 2023-09-12 ENCOUNTER — Other Ambulatory Visit (HOSPITAL_COMMUNITY): Payer: Self-pay

## 2023-09-13 ENCOUNTER — Telehealth: Payer: Self-pay

## 2023-09-13 DIAGNOSIS — B20 Human immunodeficiency virus [HIV] disease: Secondary | ICD-10-CM

## 2023-09-13 MED ORDER — BIKTARVY 50-200-25 MG PO TABS: 1.0000 | ORAL_TABLET | Freq: Every day | ORAL | 1 refills | Status: DC

## 2023-09-13 NOTE — Telephone Encounter (Signed)
Patient called, his Medicaid has been canceled and he is working to reinstate it with his case Financial controller.   He's been out of [facility] for  few days and is requesting samples. Advised that we do not have samples of Odefsey. He is willing to switch ART if it means he can get back on medication today.   Sandie Ano, RN

## 2023-09-13 NOTE — Telephone Encounter (Addendum)
Per Dr. Daiva Eves, okay to discontinue Odefsey and switch to [facility].   New Rx sent to [facility]. Wayne Wolfe is unable to get to the office before we close today so he will have his niece come by to pick up samples for him.   Discussed that Susanne Borders can be taken with or without food and counseled him to separate Biktarvy from medications containing calcium, magnesium, or iron by about 6 hours. Encouraged him to take Biktarvy around the same time each day.   Scheduled for one month follow up to assess tolerance. Patient verbalized understanding and has no further questions.   Sandie Ano, RN

## 2023-09-13 NOTE — Addendum Note (Signed)
Addended by: Linna Hoff D on: 09/13/2023 10:19 AM   Modules accepted: Orders

## 2023-09-16 ENCOUNTER — Other Ambulatory Visit (HOSPITAL_COMMUNITY): Payer: Self-pay

## 2023-09-18 ENCOUNTER — Ambulatory Visit: Payer: Medicaid Other

## 2023-09-18 DIAGNOSIS — G8929 Other chronic pain: Secondary | ICD-10-CM

## 2023-09-18 DIAGNOSIS — M6281 Muscle weakness (generalized): Secondary | ICD-10-CM

## 2023-09-18 DIAGNOSIS — M25561 Pain in right knee: Secondary | ICD-10-CM | POA: Diagnosis not present

## 2023-09-18 NOTE — Therapy (Addendum)
OUTPATIENT PHYSICAL THERAPY TREATMENT NOTE/DISCHARGE   Patient Name: Wayne Wolfe MRN: [id number] DOB:1966-01-28, 57 y.o., male Today's Date: 09/18/2023  END OF SESSION:  PT End of Session - 09/18/23 0827     Visit Number 6    Number of Visits 12    Date for PT Re-Evaluation 10/02/23    Authorization Type [employer]    Authorization Time Period 27 VL    Authorization - Visit Number 7    Authorization - Number of Visits 27    PT Start Time 0830    PT Stop Time 0908    PT Time Calculation (min) 38 min    Activity Tolerance Patient tolerated treatment well    Behavior During Therapy WFL for tasks assessed/performed              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   Hypertension 2009   Well controlled on HCTZ   Hypokalemia 01/12/2019   Knee effusion, right 07/19/2017   Lobar pneumonia (HCC) 01/12/2019   Obesity 10/19/2020   Obstructive sleep apnea 10/19/2020   Otitis media 03/13/2017   Polyuria 03/13/2017   Poorly controlled diabetes mellitus (HCC) 03/13/2017   Type 2 diabetes mellitus (HCC)    Well controlled on metformin   Urine abnormality 07/04/2023   Vaccine counseling 07/04/2023   Weight loss 03/13/2017   Past Surgical History:  Procedure Laterality Date   CARPAL TUNNEL RELEASE Right 1990s   none     Patient Active Problem List   Diagnosis Date Noted   Vaccine counseling 07/04/2023   Urine abnormality 07/04/2023   Chest pain 12/25/2022   Knee pain 06/20/2022   Environmental allergies 02/16/2022   Healthcare maintenance 12/04/2021   Obstructive sleep apnea 10/19/2020    Morbid obesity (HCC) 10/19/2020   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   Multiple joint pain 02/11/2020   Syphilis 12/14/2019   Unprotected sexual intercourse 12/01/2019   Pulmonary nodule 12/26/2017   Routine screening for STI (sexually transmitted infection) 10/16/2017   Otitis media 03/13/2017   Polyuria 03/13/2017   Colon cancer screening 02/24/2016   Testicular pain 01/20/2016   Numbness and tingling of both feet 08/26/2013   Keloid of skin 06/25/2012   Type 2 diabetes mellitus with other specified complication (HCC) 03/12/2011   Hyperlipidemia associated with type 2 diabetes mellitus (HCC) 03/12/2011   Hypertension associated with diabetes (HCC) 04/20/2008   Human immunodeficiency virus (HIV) disease (HCC) 04/15/2008    PCP: Earl Lagos, MD  REFERRING PROVIDER: Dickie La MD  REFERRING DIAG: M25.561 (ICD-10-CM) - Acute pain of right knee  THERAPY DIAG:  Chronic pain of right knee  Muscle weakness (generalized)  Rationale for Evaluation and Treatment: Rehabilitation  ONSET DATE: 3-6 mos   SUBJECTIVE:   SUBJECTIVE STATEMENT: Patient reports pain and tightness in his Rt knee this morning.    PERTINENT HISTORY: 6 mos + PAIN:  Are you having pain? Yes: NPRS scale: 7/10 Pain location: Rt medial  Pain description: sharp stabbing burn, ache  Aggravating factors: walking, bending, activity Relieving factors: ice, tylenol   PRECAUTIONS: Other: none   RED FLAGS: None  WEIGHT BEARING RESTRICTIONS: No  FALLS:  Has patient fallen in last 6 months? No  LIVING ENVIRONMENT: Lives with: lives alone Lives in: House/apartment Stairs: Yes: External: 3 steps; on right going up Has following equipment at home: None  OCCUPATION: Not working , has had multiple jobs in various fields   PLOF: Independent, family and friends, sports events, shopping  PATIENT GOALS: I want to be able to move better.  Get rid of this pain.   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  MUSCLE LENGTH: Tightness in hips ER and IR   POSTURE:  genu varum   PALPATION: Pain medial joint line in Rt LE Joint effusion/+ bounce home  LOWER EXTREMITY ROM:  A/PROM Right eval Left eval  Hip flexion    Hip extension    Hip abduction    Hip adduction    Hip internal rotation    Hip external rotation    Knee flexion 92 124  Knee extension -4/0d 0  Ankle dorsiflexion    Ankle plantarflexion    Ankle inversion    Ankle eversion     (Blank rows = not tested)  LOWER EXTREMITY MMT:  MMT Right eval Left eval  Hip flexion 4- 4  Hip extension    Hip abduction 3 3+  Hip adduction    Hip internal rotation    Hip external rotation    Knee flexion 4+ 5  Knee extension 4-  5  Ankle dorsiflexion    Ankle plantarflexion    Ankle inversion    Ankle eversion     (Blank rows = not tested)  LOWER EXTREMITY SPECIAL TESTS:  Knee neg for M/L stability testing  Poor quad set ability Joint effusion observed  FUNCTIONAL TESTS:  30s chair stand test 4 reps   GAIT: Distance walked: 13ft x2 Assistive device utilized: None Level of assistance: Complete Independence Comments: mild antalgia   TODAY'S TREATMENT:        [facility] Adult PT Treatment:                                                DATE: 09/18/23 Therapeutic Exercise: Nustep L5 6 min Standing hip abduction/extension 2x10 ea BIL RTB at ankles Step ups 6" fwd/lat x10 ea BIL Heel/toe raises 2x10 March at counter to promote SLS and knee flexion 2x30" working to no UE support SLR 10x, 5x (small range, muscular fatigue) SAQs 3# 2" hold at top 2x10 FAQs with adduction 3# 2x10 RLE    [facility] Adult PT Treatment:                                                DATE: 09/04/23 Therapeutic Exercise: Nustep L4 5 min Standing hip abduction 2x10  BIL Step ups 6" fwd/lat x10 ea BIL Heel/toe raises 2x10 March at counter 15/15 to promote SLS and knee flexion  QS 3s 15x SLR 3# 10x SAQs 3# 15x FAQs with adduction 3# 15x TKE BluTB 15x    [facility] Adult PT Treatment:  DATE: 08/26/23 Therapeutic Exercise: Nustep L3 8 min QS 3s 15x SLR 3# 15x SAQs 3# 15x Heel slides with strap 15x FAQs with adduction 3# 15x Supine hip fallouts BluTB 15x S/L R clams BluTB 15x Heel raises against wall 15x March against wall 15/15 to promote SLS and knee flexion  TKE BluTB 15x                                                                                                                           PATIENT EDUCATION:  Education details: POC, HEP and support of knee with improved hip mobility and strength  Person educated: Patient Education method: Explanation Education comprehension: verbalized understanding and returned demonstration  HOME EXERCISE PROGRAM: Access Code: [email or web address] URL: [email or web address] Date: 08/02/2023 Prepared by: Gustavus Bryant  Exercises - Seated Long Arc Quad  - 3 x daily - 5 x weekly - 3 sets - 10 reps - 2s hold - Seated Heel Slide  - 3 x daily - 5 x weekly - 3 sets - 10 reps - Standing Heel Raise with Support  - 3 x daily - 5 x weekly - 3 sets - 10 reps  ASSESSMENT:  CLINICAL IMPRESSION:  Patient presents to PT reporting continued pain and tightness in his Rt knee. Has received imaging results which are grossly stable. Session today continued to focus on proximal hip and LE strengthening. Towards end of session patient mentions he occasionally has sharp pain on the outer LLE starting at the hip/lower back and radiating to the knee. Advised patient to follow up with MD if this continues. Patient was able to tolerate all prescribed exercises with no adverse effects. Patient continues to benefit from skilled PT services and should be progressed as able to improve  functional independence.    OBJECTIVE IMPAIRMENTS: decreased mobility, difficulty walking, decreased ROM, decreased strength, increased edema, increased fascial restrictions, impaired flexibility, postural dysfunction, obesity, and pain.   ACTIVITY LIMITATIONS: carrying, lifting, bending, standing, squatting, sleeping, stairs, transfers, and locomotion level  PARTICIPATION LIMITATIONS: shopping and community activity  PERSONAL FACTORS: Fitness and 1 comorbidity: diabetes  are also affecting patient's functional outcome.   REHAB POTENTIAL: Good  CLINICAL DECISION MAKING: Evolving  EVALUATION COMPLEXITY: Low   GOALS:   LONG TERM GOALS: Target date: 10/02/2023      Pt will be independent with home exercise program for lower extremity strength Baseline: [email or web address] Goal status: Met  2.  Patient will increase  strength to 4 out of 5 bilaterally in order to support knees with functional activity Baseline:  MMT Right eval Left eval  Hip flexion 4- 4  Hip extension    Hip abduction 3 3+  Hip adduction    Hip internal rotation    Hip external rotation    Knee flexion 4+ 5  Knee extension 4-  5   Goal status: Ongoing  3.  Patient will increase 30s chair stand reps from 4 to 8 with/without arms to demonstrate and improved functional  ability with less pain/difficulty as well as reduce fall risk.  Baseline: 4 reps Goal status: INITIAL  4.  Patient will be able to walk in the community, shopping with pain no more than 4/10 Baseline: 8-9/10 avg. Goal status: INITIAL  5.   Increase R knee AROM to 0d extension and 125d flexion Baseline: -4d and 92d respectively Goal status: INITIAL  6.  Patient will score at least 43% on FOTO to signify clinically meaningful improvement in functional abilities.   Baseline: 15 Goal status: INITIAL   PLAN:  PT FREQUENCY: 2x/week  PT DURATION: 3 weeks  PLANNED INTERVENTIONS: 97164- PT Re-evaluation, 97110-Therapeutic exercises, 97530-  Therapeutic activity, 97112- Neuromuscular re-education, 97535- Self Care, 13244- Manual therapy, [phone]- Gait training, [phone]- Ionotophoresis 4mg /ml Dexamethasone, Patient/Family education, Balance training, Taping, Dry Needling, and Joint mobilization  PLAN FOR NEXT SESSION: NuStep, HEP, gait and functional testing , hip strength   For all possible CPT codes, reference the Planned Interventions line above.     Check all conditions that are expected to impact treatment: {Conditions expected to impact treatment:Morbid obesity   If treatment provided at initial evaluation, no treatment charged due to lack of authorization.      Berta Minor, PTA 09/18/2023, 9:09 AM

## 2023-09-20 ENCOUNTER — Other Ambulatory Visit: Payer: Self-pay | Admitting: Pharmacist

## 2023-09-20 ENCOUNTER — Other Ambulatory Visit (HOSPITAL_COMMUNITY): Payer: Self-pay

## 2023-09-20 MED ORDER — BICTEGRAVIR-EMTRICITAB-TENOFOV 50-200-25 MG PO TABS: 1.0000 | ORAL_TABLET | Freq: Every day | ORAL | Status: AC

## 2023-09-20 NOTE — Therapy (Deleted)
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   Hypertension 2009   Well controlled on HCTZ   Hypokalemia 01/12/2019   Knee effusion, right 07/19/2017   Lobar pneumonia (HCC) 01/12/2019   Obesity 10/19/2020   Obstructive sleep apnea 10/19/2020   Otitis media 03/13/2017   Polyuria 03/13/2017   Poorly controlled diabetes mellitus (HCC) 03/13/2017   Type 2 diabetes mellitus (HCC)    Well controlled on metformin   Urine abnormality 07/04/2023   Vaccine counseling 07/04/2023   Weight loss 03/13/2017   Past Surgical History:  Procedure Laterality Date   CARPAL TUNNEL RELEASE Right 1990s   none     Patient Active Problem List   Diagnosis Date Noted   Vaccine counseling 07/04/2023   Urine abnormality 07/04/2023   Chest pain 12/25/2022   Knee pain 06/20/2022   Environmental allergies 02/16/2022   Healthcare maintenance 12/04/2021   Obstructive sleep apnea 10/19/2020   Morbid obesity (HCC) 10/19/2020   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   Multiple joint pain 02/11/2020   Syphilis 12/14/2019   Unprotected sexual intercourse 12/01/2019   Pulmonary nodule 12/26/2017   Routine screening for STI (sexually transmitted infection) 10/16/2017   Otitis media 03/13/2017   Polyuria 03/13/2017   Colon cancer screening 02/24/2016   Testicular pain 01/20/2016   Numbness and tingling of both feet 08/26/2013   Keloid of skin 06/25/2012   Type 2 diabetes  mellitus with other specified complication (HCC) 03/12/2011   Hyperlipidemia associated with type 2 diabetes mellitus (HCC) 03/12/2011   Hypertension associated with diabetes (HCC) 04/20/2008   Human immunodeficiency virus (HIV) disease (HCC) 04/15/2008    PCP: Earl Lagos, MD  REFERRING PROVIDER: Dickie La MD  REFERRING DIAG: M25.561 (ICD-10-CM) - Acute pain of right knee  THERAPY DIAG:  No diagnosis found.  Rationale for Evaluation and Treatment: Rehabilitation  ONSET DATE: 3-6 mos   SUBJECTIVE:   SUBJECTIVE STATEMENT: Patient reports pain and tightness in his Rt knee this morning.    PERTINENT HISTORY: 6 mos + PAIN:  Are you having pain? Yes: NPRS scale: 7/10 Pain location: Rt medial  Pain description: sharp stabbing burn, ache  Aggravating factors: walking, bending, activity Relieving factors: ice, tylenol   PRECAUTIONS: Other: none   RED FLAGS: None   WEIGHT BEARING RESTRICTIONS: No  FALLS:  Has patient fallen in last 6 months? No  LIVING ENVIRONMENT: Lives with: lives alone Lives in: House/apartment Stairs: Yes: External: 3 steps; on right going up Has following equipment at home: None  OCCUPATION: Not working , has had multiple jobs in various fields   PLOF: Independent, family and friends, sports events, shopping  PATIENT GOALS: I want to be able to move better.  Get rid of this pain.   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  MUSCLE LENGTH: Tightness in hips ER and IR   POSTURE:  genu varum   PALPATION: Pain medial joint line in Rt LE Joint effusion/+ bounce home  LOWER EXTREMITY ROM:  A/PROM Right eval Left eval  Hip flexion    Hip extension    Hip abduction    Hip adduction    Hip internal rotation    Hip external rotation    Knee  flexion 92 124  Knee extension -4/0d 0  Ankle dorsiflexion    Ankle plantarflexion    Ankle inversion    Ankle eversion     (Blank rows = not tested)  LOWER EXTREMITY MMT:  MMT Right eval Left eval  Hip flexion 4- 4  Hip extension    Hip abduction 3 3+  Hip adduction    Hip internal rotation    Hip external rotation    Knee flexion 4+ 5  Knee extension 4-  5  Ankle dorsiflexion    Ankle plantarflexion    Ankle inversion    Ankle eversion     (Blank rows = not tested)  LOWER EXTREMITY SPECIAL TESTS:  Knee neg for M/L stability testing  Poor quad set ability Joint effusion observed  FUNCTIONAL TESTS:  30s chair stand test 4 reps   GAIT: Distance walked: 50ft x2 Assistive device utilized: None Level of assistance: Complete Independence Comments: mild antalgia   TODAY'S TREATMENT:        [facility] Adult PT Treatment:                                                DATE: 09/18/23 Therapeutic Exercise: Nustep L5 6 min Standing hip abduction/extension 2x10 ea BIL RTB at ankles Step ups 6" fwd/lat x10 ea BIL Heel/toe raises 2x10 March at counter to promote SLS and knee flexion 2x30" working to no UE support SLR 10x, 5x (small range, muscular fatigue) SAQs 3# 2" hold at top 2x10 FAQs with adduction 3# 2x10 RLE    [facility] Adult PT Treatment:                                                DATE: 09/04/23 Therapeutic Exercise: Nustep L4 5 min Standing hip abduction 2x10 BIL Step ups 6" fwd/lat x10 ea BIL Heel/toe raises 2x10 March at counter 15/15 to promote SLS and knee flexion  QS 3s 15x SLR 3# 10x SAQs 3# 15x FAQs with adduction 3# 15x TKE BluTB 15x    [facility] Adult PT Treatment:                                                DATE: 08/26/23 Therapeutic Exercise: Nustep L3 8 min QS 3s 15x SLR 3# 15x SAQs 3# 15x Heel slides with strap 15x FAQs with adduction 3# 15x Supine hip fallouts BluTB 15x S/L R clams BluTB 15x Heel raises against wall 15x March against  wall 15/15 to promote SLS and knee flexion  TKE BluTB 15x  PATIENT EDUCATION:  Education details: POC, HEP and support of knee with improved hip mobility and strength  Person educated: Patient Education method: Explanation Education comprehension: verbalized understanding and returned demonstration  HOME EXERCISE PROGRAM: Access Code: [email or web address] URL: [email or web address] Date: 08/02/2023 Prepared by: Gustavus Bryant  Exercises - Seated Long Arc Quad  - 3 x daily - 5 x weekly - 3 sets - 10 reps - 2s hold - Seated Heel Slide  - 3 x daily - 5 x weekly - 3 sets - 10 reps - Standing Heel Raise with Support  - 3 x daily - 5 x weekly - 3 sets - 10 reps  ASSESSMENT:  CLINICAL IMPRESSION:  Patient presents to PT reporting continued pain and tightness in his Rt knee. Has received imaging results which are grossly stable. Session today continued to focus on proximal hip and LE strengthening. Towards end of session patient mentions he occasionally has sharp pain on the outer LLE starting at the hip/lower back and radiating to the knee. Advised patient to follow up with MD if this continues. Patient was able to tolerate all prescribed exercises with no adverse effects. Patient continues to benefit from skilled PT services and should be progressed as able to improve functional independence.    OBJECTIVE IMPAIRMENTS: decreased mobility, difficulty walking, decreased ROM, decreased strength, increased edema, increased fascial restrictions, impaired flexibility, postural dysfunction, obesity, and pain.   ACTIVITY LIMITATIONS: carrying, lifting, bending, standing, squatting, sleeping, stairs, transfers, and locomotion level  PARTICIPATION LIMITATIONS: shopping and community activity  PERSONAL FACTORS: Fitness and 1 comorbidity: diabetes  are also affecting patient's  functional outcome.   REHAB POTENTIAL: Good  CLINICAL DECISION MAKING: Evolving  EVALUATION COMPLEXITY: Low   GOALS:   LONG TERM GOALS: Target date: 10/02/2023      Pt will be independent with home exercise program for lower extremity strength Baseline: [id number] Goal status: Met  2.  Patient will increase  strength to 4 out of 5 bilaterally in order to support knees with functional activity Baseline:  MMT Right eval Left eval  Hip flexion 4- 4  Hip extension    Hip abduction 3 3+  Hip adduction    Hip internal rotation    Hip external rotation    Knee flexion 4+ 5  Knee extension 4-  5   Goal status: INITIAL  3.  Patient will increase 30s chair stand reps from 4 to 8 with/without arms to demonstrate and improved functional ability with less pain/difficulty as well as reduce fall risk.  Baseline: 4 reps Goal status: INITIAL  4.  Patient will be able to walk in the community, shopping with pain no more than 4/10 Baseline: 8-9/10 avg. Goal status: INITIAL  5.   Increase R knee AROM to 0d extension and 125d flexion Baseline: -4d and 92d respectively Goal status: INITIAL  6.  Patient will score at least 43% on FOTO to signify clinically meaningful improvement in functional abilities.   Baseline: 15 Goal status: INITIAL   PLAN:  PT FREQUENCY: 2x/week  PT DURATION: 6 weeks  PLANNED INTERVENTIONS: Therapeutic exercises, Therapeutic activity, Neuromuscular re-education, Balance training, Gait training, Patient/Family education, Self Care, Joint mobilization, Dry Needling, Taping, Ionotophoresis 4mg /ml Dexamethasone, Manual therapy, and Re-evaluation  PLAN FOR NEXT SESSION: NuStep, HEP, gait and functional testing , hip strength   For all possible CPT codes, reference the Planned Interventions line above.     Check all conditions that are expected to impact treatment: {Conditions expected to impact treatment:Morbid obesity  If treatment provided at initial  evaluation, no treatment charged due to lack of authorization.      Hildred Laser, PT 09/20/2023, 9:03 AM

## 2023-09-20 NOTE — Progress Notes (Signed)
Medication Samples have been provided to the patient.  Drug name: Biktarvy        Strength: 50/200/25 mg       Qty: 14 tablets (2 bottles) LOT: CSCFVA   Exp.Date: 10/26  Dosing instructions: Take one tablet by mouth once daily  The patient has been instructed regarding the correct time, dose, and frequency of taking this medication, including desired effects and most common side effects.   Margarite Gouge, PharmD, CPP, BCIDP, AAHIVP Clinical Pharmacist Practitioner Infectious Diseases Clinical Pharmacist [facility]

## 2023-09-23 ENCOUNTER — Telehealth: Payer: Self-pay

## 2023-09-23 ENCOUNTER — Ambulatory Visit: Payer: Medicaid Other

## 2023-09-23 NOTE — Telephone Encounter (Signed)
TC due to missed visit.  VM left informing him of next appointment as well as attendance policy regarding missed visits and discharge criteria.

## 2023-09-27 ENCOUNTER — Other Ambulatory Visit (HOSPITAL_COMMUNITY): Payer: Self-pay

## 2023-09-30 ENCOUNTER — Other Ambulatory Visit: Payer: Self-pay | Admitting: Pharmacist

## 2023-09-30 DIAGNOSIS — B20 Human immunodeficiency virus [HIV] disease: Secondary | ICD-10-CM

## 2023-09-30 MED ORDER — BIKTARVY 50-200-25 MG PO TABS: 1.0000 | ORAL_TABLET | Freq: Every day | ORAL | Status: AC

## 2023-09-30 NOTE — Progress Notes (Signed)
Medication Samples have been provided to the patient.  Drug name: Biktarvy        Strength: 50/200/25 mg       Qty: 7 tablets (1 bottle)   LOT: CSCFVA   Exp.Date: 07/31/25  Dosing instructions: Take one tablet by mouth once daily  The patient has been instructed regarding the correct time, dose, and frequency of taking this medication, including desired effects and most common side effects.   Taheem Fricke L. Jannette Fogo, PharmD, BCIDP, AAHIVP, CPP Clinical Pharmacist Practitioner Infectious Diseases Clinical Pharmacist [facility] 09/12/2020, 10:07 AM

## 2023-10-01 ENCOUNTER — Ambulatory Visit: Payer: Medicaid Other | Admitting: Physical Therapy

## 2023-10-03 ENCOUNTER — Ambulatory Visit: Payer: Medicaid Other

## 2023-10-03 ENCOUNTER — Other Ambulatory Visit (HOSPITAL_COMMUNITY): Payer: Self-pay

## 2023-10-10 ENCOUNTER — Other Ambulatory Visit (HOSPITAL_COMMUNITY): Payer: Self-pay

## 2023-10-10 ENCOUNTER — Other Ambulatory Visit: Payer: Self-pay

## 2023-10-10 ENCOUNTER — Ambulatory Visit: Payer: Medicaid Other | Attending: Internal Medicine | Admitting: Physical Therapy

## 2023-10-10 DIAGNOSIS — Z794 Long term (current) use of insulin: Secondary | ICD-10-CM

## 2023-10-10 MED ORDER — INSULIN GLARGINE 100 UNIT/ML SOLOSTAR PEN
30.0000 [IU] | PEN_INJECTOR | Freq: Every day | SUBCUTANEOUS | 3 refills | Status: DC
  Filled 2023-10-10: qty 15, 50d supply, fill #0
  Filled 2023-12-02: qty 15, 50d supply, fill #1
  Filled 2024-02-19: qty 15, 50d supply, fill #2
  Filled 2024-04-07 – 2024-04-21 (×2): qty 15, 50d supply, fill #3

## 2023-10-10 NOTE — Telephone Encounter (Signed)
 Medication sent to pharmacy

## 2023-10-14 ENCOUNTER — Ambulatory Visit: Payer: Medicaid Other | Admitting: Infectious Disease

## 2023-10-14 ENCOUNTER — Telehealth: Payer: Self-pay

## 2023-10-14 NOTE — Telephone Encounter (Signed)
 Received call from [facility] stating [facility] had active prescriptions for both [facility] and Biktarvy. [facility] [employer], spoke with Renea Ee and relayed that Charlett Lango should be discontinued, patient should just be on [place].   Sandie Ano, RN

## 2023-10-16 ENCOUNTER — Other Ambulatory Visit (HOSPITAL_COMMUNITY): Payer: Self-pay

## 2023-10-16 ENCOUNTER — Encounter: Payer: Self-pay | Admitting: Infectious Disease

## 2023-10-16 ENCOUNTER — Other Ambulatory Visit: Payer: Self-pay

## 2023-10-16 ENCOUNTER — Ambulatory Visit (INDEPENDENT_AMBULATORY_CARE_PROVIDER_SITE_OTHER): Payer: Medicaid Other | Admitting: Infectious Disease

## 2023-10-16 VITALS — BP 121/79 | HR 84 | Temp 98.3°F | Ht 66.0 in | Wt 236.0 lb

## 2023-10-16 DIAGNOSIS — B20 Human immunodeficiency virus [HIV] disease: Secondary | ICD-10-CM

## 2023-10-16 DIAGNOSIS — E1169 Type 2 diabetes mellitus with other specified complication: Secondary | ICD-10-CM

## 2023-10-16 DIAGNOSIS — K6282 Dysplasia of anus: Secondary | ICD-10-CM

## 2023-10-16 DIAGNOSIS — M1990 Unspecified osteoarthritis, unspecified site: Secondary | ICD-10-CM | POA: Insufficient documentation

## 2023-10-16 DIAGNOSIS — J069 Acute upper respiratory infection, unspecified: Secondary | ICD-10-CM

## 2023-10-16 DIAGNOSIS — I152 Hypertension secondary to endocrine disorders: Secondary | ICD-10-CM

## 2023-10-16 HISTORY — DX: Unspecified osteoarthritis, unspecified site: M19.90

## 2023-10-16 NOTE — Progress Notes (Signed)
 Subjective:  Chief complaint: Sinus pressure congestion times several weeks  Patient ID: Wayne Wolfe, male    DOB: 06-10-1966, 58 y.o.   MRN: [id number]  HPI  Discussed the use of AI scribe software for clinical note transcription with the patient, who gave verbal consent to proceed.  History of Present Illness   Wayne Wolfe, a patient with HIV, presents for a discussion about switching from Odefsey  to Biktarvy  due to insurance issues. He expresses concerns about potential kidney disease risk associated with Biktarvy , which he learned about from a commercial. The patient has a history of being on various HIV medications, including Complera and Truvada, which contain older versions of tenofovir  that have a higher risk of kidney damage.  [name] also reports a persistent sinus issue that has been bothering him for a couple of weeks, along with a slight cough. He has tried over-the-counter remedies such as Tylenol  Sinus, but they have not been very effective.  In addition, Wayne Wolfe has diabetes and is currently on Lantus  and Ozempic . He also takes Crestor  for cholesterol, and metoprolol  and amlodipine for blood pressure. He is seeing physical therapy for osteoarthritis in his right knee.  Wayne Wolfe also mentions a history of an abnormal anal Pap smear and is due for another one. He had an anoscopy at [facility] last year, which showed some concerns but nothing alarming.       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   Hypertension 2009   Well controlled on HCTZ   Hypokalemia 01/12/2019   Knee effusion, right 07/19/2017    Lobar pneumonia (HCC) 01/12/2019   Obesity 10/19/2020   Obstructive sleep apnea 10/19/2020   Otitis media 03/13/2017   Polyuria 03/13/2017   Poorly controlled diabetes mellitus (HCC) 03/13/2017   Type 2 diabetes mellitus (HCC)    Well controlled on metformin    Urine abnormality 07/04/2023   Vaccine counseling 07/04/2023   Weight loss 03/13/2017    Past Surgical History:  Procedure Laterality Date   CARPAL TUNNEL RELEASE Right 1990s   none      Family History  Problem Relation Age of Onset   Diabetes Mellitus II Sister    Colon polyps Sister    Diabetes Mellitus II Mother    Coronary artery disease Mother 82       CABGx6   Coronary artery disease Sister 103       CABG   Diabetes Mellitus II Sister    Coronary artery disease Sister    Diabetes Mellitus II Sister    Colon cancer Neg Hx    Esophageal cancer Neg Hx    Rectal cancer Neg Hx    Stomach cancer Neg Hx       Social History   Socioeconomic History   Marital status: Single    Spouse name: Not on file   Number of children: Not on file   Years of education: 13   Highest education level: Not on file  Occupational History    Employer: UNEMPLOYED  Tobacco Use   Smoking status: Former    Current packs/day: 0.00    Types:  Cigarettes    Quit date: 2003    Years since quitting: 22.0   Smokeless tobacco: Never  Vaping Use   Vaping status: Never Used  Substance and Sexual Activity   Alcohol use: No    Alcohol/week: 0.0 standard drinks of alcohol    Comment: Last used 2004   Drug use: No    Comment: Last used 2003   Sexual activity: Yes    Partners: Male    Comment: declined condoms  Other Topics Concern   Not on file  Social History Narrative   On disability. Lives in [place].    Social Drivers of Health   Financial Resource Strain: Medium Risk (12/25/2022)   Overall Financial Resource Strain (CARDIA)    Difficulty of Paying Living Expenses: Somewhat hard  Food Insecurity: Food Insecurity Present  (12/25/2022)   Hunger Vital Sign    Worried About Running Out of Food in the Last Year: Sometimes true    Ran Out of Food in the Last Year: Sometimes true  Transportation Needs: No Transportation Needs (12/25/2022)   PRAPARE - Administrator, Civil Service (Medical): No    Lack of Transportation (Non-Medical): No  Physical Activity: Inactive (12/25/2022)   Exercise Vital Sign    Days of Exercise per Week: 1 day    Minutes of Exercise per Session: 0 min  Stress: Stress Concern Present (12/25/2022)   [employer] of Occupational Health - Occupational Stress Questionnaire    Feeling of Stress : To some extent  Social Connections: Moderately Integrated (12/25/2022)   Social Connection and Isolation Panel [NHANES]    Frequency of Communication with Friends and Family: More than three times a week    Frequency of Social Gatherings with Friends and Family: Three times a week    Attends Religious Services: More than 4 times per year    Active Member of Clubs or Organizations: Yes    Attends Banker Meetings: 1 to 4 times per year    Marital Status: Never married    No Known Allergies   Current Outpatient Medications:    Accu-Chek FastClix Lancets MISC, Check blood sugar before meals and after meals, Disp: 102 each, Rfl: 12   acetaminophen  (TYLENOL ) 500 MG tablet, Take 1,000 mg by mouth every 6 (six) hours as needed for fever., Disp: , Rfl:    bictegravir-emtricitabine -tenofovir  AF (BIKTARVY ) 50-200-25 MG TABS tablet, Take 1 tablet by mouth daily., Disp: 30 tablet, Rfl: 1   Blood Glucose Monitoring Suppl (ACCU-CHEK GUIDE) w/Device KIT, 1 each by Does not apply route 6 (six) times daily., Disp: 1 kit, Rfl: 0   Continuous Glucose Sensor (DEXCOM G7 SENSOR) MISC, Change sensor every 10 days or as directed by your provider (for diabetes)., Disp: 3 each, Rfl: 3   diclofenac  Sodium (VOLTAREN ) 1 % GEL, Apply 4 grams topically 4 (four) times daily as needed, Disp: 100 g, Rfl:  3   empagliflozin  (JARDIANCE ) 10 MG TABS tablet, Take 1 tablet (10 mg total) by mouth daily before breakfast., Disp: 90 tablet, Rfl: 0   fexofenadine  (ALLEGRA ) 180 MG tablet, TAKE 1 TABLET(180 MG) BY MOUTH DAILY, Disp: 30 tablet, Rfl: 5   gabapentin  (NEURONTIN ) 300 MG capsule, Take 1 capsule (300 mg total) by mouth 3 (three) times daily., Disp: 90 capsule, Rfl: 2   glucose blood (ACCU-CHEK GUIDE) test strip, Check blood sugar before meals and after meals, Disp: 100 each, Rfl: 12   insulin  glargine (LANTUS ) 100 UNIT/ML Solostar Pen, Inject 30 Units  into the skin daily., Disp: 15 mL, Rfl: 3   Insulin  Pen Needle (PEN NEEDLES) 32G X 4 MM MISC, Use daily as directed., Disp: 200 each, Rfl: 3   lisinopril  (ZESTRIL ) 20 MG tablet, Take 1 tablet (20 mg total) by mouth daily., Disp: 90 tablet, Rfl: 3   metFORMIN  (GLUCOPHAGE -XR) 500 MG 24 hr tablet, Take 2 tablets (1,000 mg total) by mouth 2 (two) times daily with a meal., Disp: 360 tablet, Rfl: 2   metoprolol  tartrate (LOPRESSOR ) 100 MG tablet, Take 1 tablet by mouth two hours prior to scan, Disp: 1 tablet, Rfl: 0   rosuvastatin  (CRESTOR ) 20 MG tablet, TAKE 1 TABLET(20 MG) BY MOUTH DAILY, Disp: 90 tablet, Rfl: 3   Semaglutide , 2 MG/DOSE, (OZEMPIC , 2 MG/DOSE,) 8 MG/3ML SOPN, Inject 2 mg into the skin once a week., Disp: 3 mL, Rfl: 10   Review of Systems  Constitutional:  Negative for activity change, appetite change, chills, diaphoresis, fatigue, fever and unexpected weight change.  HENT:  Positive for rhinorrhea and sinus pressure. Negative for congestion, sneezing, sore throat and trouble swallowing.   Eyes:  Negative for photophobia and visual disturbance.  Respiratory:  Negative for cough, chest tightness, shortness of breath, wheezing and stridor.   Cardiovascular:  Negative for chest pain, palpitations and leg swelling.  Gastrointestinal:  Negative for abdominal distention, abdominal pain, anal bleeding, blood in stool, constipation, diarrhea, nausea  and vomiting.  Genitourinary:  Negative for difficulty urinating, dysuria, flank pain and hematuria.  Musculoskeletal:  Negative for arthralgias, back pain, gait problem, joint swelling and myalgias.  Skin:  Negative for color change, pallor, rash and wound.  Neurological:  Negative for dizziness, tremors, weakness and light-headedness.  Hematological:  Negative for adenopathy. Does not bruise/bleed easily.  Psychiatric/Behavioral:  Negative for agitation, behavioral problems, confusion, decreased concentration, dysphoric mood and sleep disturbance.        Objective:   Physical Exam Constitutional:      Appearance: He is well-developed.  HENT:     Head: Normocephalic and atraumatic.  Eyes:     Conjunctiva/sclera: Conjunctivae normal.  Cardiovascular:     Rate and Rhythm: Normal rate and regular rhythm.  Pulmonary:     Effort: Pulmonary effort is normal. No respiratory distress.     Breath sounds: No wheezing.  Abdominal:     General: There is no distension.     Palpations: Abdomen is soft.  Musculoskeletal:        General: No tenderness. Normal range of motion.     Cervical back: Normal range of motion and neck supple.  Skin:    General: Skin is warm and dry.     Coloration: Skin is not pale.     Findings: No erythema or rash.  Neurological:     General: No focal deficit present.     Mental Status: He is alert and oriented to person, place, and time.  Psychiatric:        Mood and Affect: Mood normal.        Behavior: Behavior normal.        Thought Content: Thought content normal.        Judgment: Judgment normal.           Assessment & Plan:   Assessment and Plan    HIV Stable on Biktarvy . Discussed concerns about tenofovir  alafenamide and kidney disease risk. Explained the difference between older and newer versions of tenofovir . Assured patient of the safety of Biktarvy . I am willing to switch to  Dovato if he so wishes down the road but I generally consider TAF  to be incredibly safe -Continue Biktarvy . -[facility] prescription at [facility]. -Check viral load and CD4 count.  Anal Cancer Screening Discussed the need for annual screening due to history of abnormal results.  read notes from [facility] and they had wanted repeat HRA in a year from last March but now pt has Medicaid so will refer to [facility]  Sinus Congestion Likely viral. Discussed over-the-counter treatment options. -Recommend Afrin and Flonase for congestion relief. -Avoid Sudafed due to potential for increased blood pressure.  Diabetes Stable on Lantus  and Ozempic . -Continue current regimen.  Hyperlipidemia Stable on Crestor . -Continue current regimen.  Hypertension Stable on metoprolol  and amlodipine. -Continue current regimen.  Osteoarthritis Receiving physical therapy for right knee. -Continue current regimen.

## 2023-10-17 ENCOUNTER — Telehealth: Payer: Self-pay

## 2023-10-17 NOTE — Telephone Encounter (Signed)
Patient called he is requesting a referral to be sent to [facility] at [facility]

## 2023-10-18 LAB — T PALLIDUM AB: T Pallidum Abs: POSITIVE — AB

## 2023-10-18 LAB — T-HELPER CELLS (CD4) COUNT (NOT AT ARMC)
Absolute CD4: 541 {cells}/uL (ref 490–1740)
CD4 T Helper %: 25 % — ABNORMAL LOW (ref 30–61)
Total lymphocyte count: 2189 {cells}/uL (ref 850–3900)

## 2023-10-18 LAB — COMPLETE METABOLIC PANEL WITH GFR
AG Ratio: 1.4 (calc) (ref 1.0–2.5)
ALT: 15 U/L (ref 9–46)
AST: 11 U/L (ref 10–35)
Albumin: 4.1 g/dL (ref 3.6–5.1)
Alkaline phosphatase (APISO): 75 U/L (ref 35–144)
BUN: 8 mg/dL (ref 7–25)
CO2: 30 mmol/L (ref 20–32)
Calcium: 9.6 mg/dL (ref 8.6–10.3)
Chloride: 103 mmol/L (ref 98–110)
Creat: 1.07 mg/dL (ref 0.70–1.30)
Globulin: 2.9 g/dL (ref 1.9–3.7)
Glucose, Bld: 201 mg/dL — ABNORMAL HIGH (ref 65–99)
Potassium: 4.3 mmol/L (ref 3.5–5.3)
Sodium: 140 mmol/L (ref 135–146)
Total Bilirubin: 0.6 mg/dL (ref 0.2–1.2)
Total Protein: 7 g/dL (ref 6.1–8.1)
eGFR: 80 mL/min/{1.73_m2} (ref >=60)

## 2023-10-18 LAB — CBC WITH DIFFERENTIAL/PLATELET
Absolute Lymphocytes: 2066 {cells}/uL (ref 850–3900)
Absolute Monocytes: 398 {cells}/uL (ref 200–950)
Basophils Absolute: 70 {cells}/uL (ref 0–200)
Basophils Relative: 1.7 %
Eosinophils Absolute: 160 {cells}/uL (ref 15–500)
Eosinophils Relative: 3.9 %
HCT: 50.6 % — ABNORMAL HIGH (ref 38.5–50.0)
Hemoglobin: 16.5 g/dL (ref 13.2–17.1)
MCH: 29.4 pg (ref 27.0–33.0)
MCHC: 32.6 g/dL (ref 32.0–36.0)
MCV: 90 fL (ref 80.0–100.0)
MPV: 9.8 fL (ref 7.5–12.5)
Monocytes Relative: 9.7 %
Neutro Abs: 1406 {cells}/uL — ABNORMAL LOW (ref 1500–7800)
Neutrophils Relative %: 34.3 %
Platelets: 303 10*3/uL (ref 140–400)
RBC: 5.62 10*6/uL (ref 4.20–5.80)
RDW: 12.4 % (ref 11.0–15.0)
Total Lymphocyte: 50.4 %
WBC: 4.1 10*3/uL (ref 3.8–10.8)

## 2023-10-18 LAB — HIV-1 RNA QUANT-NO REFLEX-BLD
HIV 1 RNA Quant: NOT DETECTED {copies}/mL
HIV-1 RNA Quant, Log: NOT DETECTED {Log}

## 2023-10-18 LAB — RPR TITER: RPR Titer: 1:4 {titer} — ABNORMAL HIGH

## 2023-10-18 LAB — RPR: RPR Ser Ql: REACTIVE — AB

## 2023-10-22 ENCOUNTER — Telehealth: Payer: Self-pay | Admitting: Student

## 2023-10-22 DIAGNOSIS — M25561 Pain in right knee: Secondary | ICD-10-CM

## 2023-10-22 NOTE — Telephone Encounter (Signed)
Pt is requesting a New referral be placed to [facility].  The pt has a few NO shows and Cancellations due to Funerals and not feeling well. [facility] has closed his Referrals and will require a new referral to be placed..  Can a new Referral be placed for this patient for his Right Knee Pain with PT?

## 2023-11-04 ENCOUNTER — Telehealth: Payer: Self-pay

## 2023-11-04 NOTE — Telephone Encounter (Signed)
Patient called office today requesting appt for facial bumps that appeared last week. Bumps appeared around mouth a few days after shaving with dry razor.  Bumps have improved since washing face daily and using over the counter creams.  Advised patient to monitor area for now. If bumps flare up, spread, irritate area to contact PCP for appt. Will call office if anything is needed from [place]. Juanita Laster, RMA

## 2023-11-05 ENCOUNTER — Encounter: Payer: Medicaid Other | Admitting: Physical Therapy

## 2023-11-06 NOTE — Therapy (Addendum)
 OUTPATIENT PHYSICAL THERAPY LOWER EXTREMITY EVALUATION   Patient Name: Wayne Wolfe MRN: [id number] DOB:1966-05-11, 58 y.o., male Today's Date: 11/07/2023  END OF SESSION:  PT End of Session - 11/07/23 0857     Visit Number 1    Number of Visits 17    Date for PT Re-Evaluation 01/02/24    Authorization Type [place] MEDICAID    PT Start Time 0845    PT Stop Time 0930    PT Time Calculation (min) 45 min    Activity Tolerance Patient tolerated treatment well    Behavior During Therapy [facility] for tasks assessed/performed             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   Hypertension 2009   Well controlled on HCTZ   Hypokalemia 01/12/2019   Knee effusion, right 07/19/2017   Lobar pneumonia (HCC) 01/12/2019   Obesity 10/19/2020   Obstructive sleep apnea 10/19/2020   Osteoarthritis 10/16/2023   Otitis media 03/13/2017   Polyuria 03/13/2017   Poorly controlled diabetes mellitus (HCC) 03/13/2017   Type 2 diabetes mellitus (HCC)    Well controlled on metformin    Urine abnormality 07/04/2023   Vaccine counseling 07/04/2023   Weight loss 03/13/2017   Past Surgical History:  Procedure Laterality Date   CARPAL TUNNEL RELEASE Right 1990s   none     Patient Active Problem List   Diagnosis Date Noted   Osteoarthritis 10/16/2023   Vaccine counseling 07/04/2023   Urine abnormality 07/04/2023   Chest pain 12/25/2022   Knee pain 06/20/2022   Environmental allergies 02/16/2022   Healthcare maintenance 12/04/2021   Obstructive sleep apnea 10/19/2020   Morbid obesity (HCC) 10/19/2020   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   Multiple joint pain 02/11/2020   Syphilis 12/14/2019   Unprotected sexual intercourse 12/01/2019   Pulmonary nodule 12/26/2017   Routine screening for STI (sexually transmitted infection) 10/16/2017   Otitis media 03/13/2017   Polyuria 03/13/2017   Colon cancer screening 02/24/2016   Testicular pain 01/20/2016   Numbness and tingling of both feet 08/26/2013   Keloid of skin 06/25/2012   Type 2 diabetes mellitus with other specified complication (HCC) 03/12/2011   Hyperlipidemia associated with type 2 diabetes mellitus (HCC) 03/12/2011   Hypertension associated with diabetes (HCC) 04/20/2008   Human immunodeficiency virus (HIV) disease (HCC) 04/15/2008    PCP: Norrine Sharper, MD  REFERRING PROVIDER: Rosan Dayton BROCKS, DO  REFERRING DIAG: [id number] (ICD-10-CM) - Acute pain of right knee   THERAPY DIAG:  Chronic pain of right knee  Muscle weakness (generalized)  Rationale for Evaluation and Treatment: Rehabilitation  ONSET DATE: Chronic   SUBJECTIVE:   SUBJECTIVE STATEMENT: Patient comes in with c/c of right knee pain. Biggest factors are pain and stiffness. Wakes up with a lot of knee stiffness that gets better as he moves but does not fully leave. No numbness and tingling in the hip or knee, some in the calf or foot due to diabetes. Patient reports he fatigues easily and has leg pain that requires him to sit down when walking long distance and with community ambulation.  PERTINENT HISTORY: Type 2 diabetes  PAIN:  Are you having pain? Yes: NPRS scale: 6/10 current, 9/10 worst  Pain location: medial aspect of right knee Pain description: sharp, achy Aggravating factors: everything, sitting for a long time, waking up Relieving factors: Heat, tylenol    PRECAUTIONS: None  RED FLAGS: None   WEIGHT BEARING RESTRICTIONS: No  FALLS:  Has patient fallen in last 6 months?  No falls but some trips because he feels like the knee is giving out  LIVING  ENVIRONMENT: Lives with: lives with their family and lives alone Lives in: House/apartment Stairs: No Has following equipment at home: None  OCCUPATION: N/A   PLOF: Independent  PATIENT GOALS: Living life without pain and not hobbling around.    OBJECTIVE:  Note: Objective measures were completed at Evaluation unless otherwise noted.  PATIENT SURVEYS:  LEFS 5/80 ( 6.3%)  COGNITION: Overall cognitive status: Within functional limits for tasks assessed    MUSCLE LENGTH: Hamstrings: very tight, restricted past 90   POSTURE: weight shift left  PALPATION: Tenderness to medial knee, hamstring, and gluteal region on right side.   LOWER EXTREMITY ROM:  Active ROM Right eval Left eval  Hip flexion    Hip extension    Hip abduction    Hip adduction    Hip internal rotation    Hip external rotation    Knee flexion 70 95  Knee extension 0 0  Ankle dorsiflexion    Ankle plantarflexion    Ankle inversion    Ankle eversion     (Blank rows = not tested)  LOWER EXTREMITY MMT:  MMT Right eval Left eval  Hip flexion 4- 4-  Hip extension    Hip abduction 3 3  Hip adduction    Hip internal rotation    Hip external rotation    Knee flexion 3 3  Knee extension 3 3  Ankle dorsiflexion 4- 4-  Ankle plantarflexion 4- 4-  Ankle inversion    Ankle eversion     (Blank rows = not tested)  LOWER EXTREMITY SPECIAL TESTS:  SLR: negative  FUNCTIONAL TESTS:  30 seconds chair stand test: 3  GAIT: Distance walked: 60 ft Assistive device utilized: None Level of assistance: Complete Independence Comments: Weight shift to left, limited hip flexion, limited                                                                                                                               TREATMENT DATE:  11/07/2023 Seated hamstring stretch  LAQ  Supine clamshell GTB  Supine quad set   PATIENT EDUCATION:  Education details: Exam findings, POC, HEP.  Person educated:  Patient Education method: Explanation, Demonstration, Tactile cues, Verbal cues, and Handouts Education comprehension: verbalized understanding, returned demonstration, tactile cues required, and needs further education  HOME EXERCISE PROGRAM: Access Code: [email or web address]  ASSESSMENT:  CLINICAL IMPRESSION: Patient is a 58 y.o. male who was seen today for physical therapy evaluation  and treatment for right knee pain. Patient arrives with an antalgic gait and limited WB on the right LE. Patient has functional limitations due to the pain and muscle endurance, scoring a 3 on 30-second sit to stands which is below the age norm. Patient lacks LE strength in both right and left leg with the R>L which makes gait challenging. Community ambulation limited due to challenges with sit to stands, gait deficits, and limited endurance. ROM restrictions in both knees impact gait disturbances. Overall, patient would benefit from skilled PT to increase LE strength, endurance, and stability while decreasing pain and increasing functional ability.   OBJECTIVE IMPAIRMENTS: Abnormal gait, decreased activity tolerance, decreased ROM, decreased strength, and pain.   ACTIVITY LIMITATIONS: carrying, lifting, bending, sitting, standing, squatting, sleeping, stairs, transfers, and locomotion level  PARTICIPATION LIMITATIONS: community activity  PERSONAL FACTORS: Fitness, Past/current experiences, and Time since onset of injury/illness/exacerbation are also affecting patient's functional outcome.   REHAB POTENTIAL: Good  CLINICAL DECISION MAKING: Stable/uncomplicated  EVALUATION COMPLEXITY: Low   GOALS: Goals reviewed with patient? Yes  SHORT TERM GOALS: Target date: 12/05/2023  Patient will be I with initial HEP in order to progress with therapy. Baseline: Goal status: INITIAL  2.  Patient will score </= 6/10 at worse on the NPS by 3rd visit in order to show an improvement in pain allowing for an increase in  functional ability.   Baseline: 9/10 Goal status: INITIAL   LONG TERM GOALS: Target date: 01/02/2024  Patient will be independent with final HEP in order to continue treatment at home for pain tolerance.  Baseline:  Goal status: INITIAL  2.  Patient will score </= 3/10 at worse on the NPS to show an improvement in pain allowing for an increase in functional ability.   Baseline: 9/10 Goal status: INITIAL  3.  Patient will increase right knee flexion ROM by at least 20 degrees to increase functional ability.  Baseline: 70 deg Goal status: INITIAL  4.  Patient will increase strength to a 4/5 to show an increase in LE strength to increase functional ability.  Baseline: see above Goal status: INITIAL  5.  Patient will score at least a 20/80 on LEFS based on MCID guidelines to show improvement in functional ability.  Baseline: 5/80 Goal status: INITIAL  6. Patient will increase 30 second sit to stand score to at least 6 reps in order to increase functional ability and endurance.   Baseline: 3   Goal status: INITIAL  PLAN:  PT FREQUENCY: 2x/week  PT DURATION: 8 weeks  PLANNED INTERVENTIONS: 97164- PT Re-evaluation, 97110-Therapeutic exercises, 97530- Therapeutic activity, 97112- Neuromuscular re-education, 97535- Self Care, [id number]- Manual therapy, [phone]- Gait training, Patient/Family education, Balance training, Stair training, Dry Needling, Joint mobilization, Joint manipulation, Cryotherapy, and Moist heat  PLAN FOR NEXT SESSION: Review/revise HEP, progress LE strengthening, look more at sit to stands, look at stairs.    Kent Hummer, Student-PT 11/07/2023, 10:44 AM

## 2023-11-07 ENCOUNTER — Other Ambulatory Visit: Payer: Self-pay

## 2023-11-07 ENCOUNTER — Ambulatory Visit: Payer: Medicaid Other | Attending: Internal Medicine

## 2023-11-07 DIAGNOSIS — M25561 Pain in right knee: Secondary | ICD-10-CM | POA: Insufficient documentation

## 2023-11-07 DIAGNOSIS — G8929 Other chronic pain: Secondary | ICD-10-CM | POA: Diagnosis present

## 2023-11-07 DIAGNOSIS — M6281 Muscle weakness (generalized): Secondary | ICD-10-CM | POA: Insufficient documentation

## 2023-11-07 NOTE — Addendum Note (Signed)
 Addended by: Ivor Mars on: 11/07/2023 02:36 PM   Modules accepted: Orders

## 2023-11-07 NOTE — Patient Instructions (Signed)
 Access Code: [email or web address] URL: [email or web address] Date: 11/07/2023 Prepared by: Alm Kingdom  Exercises - Seated Hamstring Stretch  - 2 x daily - 7 x weekly - 2 reps - 30 sec hold - Seated Long Arc Quad  - 1 x daily - 7 x weekly - 3 sets - 10 reps - Hooklying Clamshell with Resistance  - 1 x daily - 7 x weekly - 3 sets - 10 reps - green band hold - Supine Quadricep Sets  - 1 x daily - 7 x weekly - 2-3 sets - 10 reps - 5 sec hold

## 2023-11-11 ENCOUNTER — Telehealth: Payer: Self-pay

## 2023-11-11 NOTE — Telephone Encounter (Signed)
 Received call from [facility] with [place] [employer] regarding 1:4 RPR titer.   She has been unable to get in touch with Myrtie Atkinson and requests that if we are able to contact him that we have him give her a call at [phone].  Alianna Wurster D Ixchel Duck, RN

## 2023-11-12 ENCOUNTER — Other Ambulatory Visit: Payer: Self-pay

## 2023-11-12 ENCOUNTER — Ambulatory Visit: Payer: Medicaid Other

## 2023-11-12 DIAGNOSIS — A539 Syphilis, unspecified: Secondary | ICD-10-CM | POA: Diagnosis present

## 2023-11-12 MED ORDER — PENICILLIN G BENZATHINE 1200000 UNIT/2ML IM SUSY
2.4000 10*6.[IU] | PREFILLED_SYRINGE | Freq: Once | INTRAMUSCULAR | Status: AC
  Administered 2023-11-12: 2.4 10*6.[IU] via INTRAMUSCULAR

## 2023-11-12 NOTE — Telephone Encounter (Signed)
Spoke with Onalee Hua and discussed increase in RPR titer and need for treatment. He will come in this morning for Bicillin 2.4 MU x 1 per Dr. Daiva Eves. Asked him to please call Myami with [place] [employer].   Sandie Ano, RN

## 2023-11-19 ENCOUNTER — Ambulatory Visit: Payer: Medicaid Other

## 2023-11-19 ENCOUNTER — Other Ambulatory Visit: Payer: Self-pay

## 2023-11-19 DIAGNOSIS — G8929 Other chronic pain: Secondary | ICD-10-CM

## 2023-11-19 DIAGNOSIS — M25561 Pain in right knee: Secondary | ICD-10-CM | POA: Diagnosis not present

## 2023-11-19 DIAGNOSIS — M6281 Muscle weakness (generalized): Secondary | ICD-10-CM

## 2023-11-19 NOTE — Therapy (Addendum)
OUTPATIENT PHYSICAL THERAPY LOWER EXTREMITY TREATMENT   Patient Name: Wayne Wolfe MRN: [id number] DOB:07/13/1966, 58 y.o., male Today's Date: 11/19/2023  END OF SESSION:  PT End of Session - 11/19/23 0933     Visit Number 2    Number of Visits 17    Date for PT Re-Evaluation 01/02/24    Authorization Type [place] MEDICAID    PT Start Time 0847    PT Stop Time 0930    PT Time Calculation (min) 43 min    Activity Tolerance Patient tolerated treatment well    Behavior During Therapy [facility] for tasks assessed/performed            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   Hypertension 2009   Well controlled on HCTZ   Hypokalemia 01/12/2019   Knee effusion, right 07/19/2017   Lobar pneumonia (HCC) 01/12/2019   Obesity 10/19/2020   Obstructive sleep apnea 10/19/2020   Osteoarthritis 10/16/2023   Otitis media 03/13/2017   Polyuria 03/13/2017   Poorly controlled diabetes mellitus (HCC) 03/13/2017   Type 2 diabetes mellitus (HCC)    Well controlled on metformin   Urine abnormality 07/04/2023   Vaccine counseling 07/04/2023   Weight loss 03/13/2017   Past Surgical History:  Procedure Laterality Date   CARPAL TUNNEL RELEASE Right 1990s   none     Patient Active Problem List   Diagnosis Date Noted   Osteoarthritis 10/16/2023   Vaccine counseling 07/04/2023   Urine abnormality 07/04/2023   Chest pain 12/25/2022   Knee pain 06/20/2022   Environmental allergies 02/16/2022   Healthcare maintenance 12/04/2021   Obstructive sleep apnea 10/19/2020   Morbid obesity (HCC) 10/19/2020   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   Multiple joint pain 02/11/2020   Syphilis 12/14/2019   Unprotected sexual intercourse 12/01/2019   Pulmonary nodule 12/26/2017   Routine screening for STI (sexually transmitted infection) 10/16/2017   Otitis media 03/13/2017   Polyuria 03/13/2017   Colon cancer screening 02/24/2016   Testicular pain 01/20/2016   Numbness and tingling of both feet 08/26/2013   Keloid of skin 06/25/2012   Type 2 diabetes mellitus with other specified complication (HCC) 03/12/2011   Hyperlipidemia associated with type 2 diabetes mellitus (HCC) 03/12/2011   Hypertension associated with diabetes (HCC) 04/20/2008   Human immunodeficiency virus (HIV) disease (HCC) 04/15/2008    PCP: Marrianne Mood, MD  REFERRING PROVIDER: Gust Rung, DO  REFERRING DIAG: [phone] (ICD-10-CM) - Acute pain of right knee   THERAPY DIAG:  Chronic pain of right knee  Muscle weakness (generalized)  Rationale for Evaluation and Treatment: Rehabilitation  ONSET DATE: Chronic   SUBJECTIVE:   SUBJECTIVE STATEMENT: Patient comes in with the same knee pain he has been feeling. Doesn't feel like his knee is locking out as much but he does feel a lot of pain in the right lateral knee. Reports he has been compliant with HEP.   EVAL: Patient comes in with c/c of right knee pain. Biggest factors are pain and stiffness. Wakes up with a lot of knee stiffness that gets better as he moves but does not  fully leave. No numbness and tingling in the hip or knee, some in the calf or foot due to diabetes. Patient reports he fatigues easily and has leg pain that requires him to sit down when walking long distance and with community ambulation.  PERTINENT HISTORY: Type 2 diabetes  PAIN:  Are you having pain? Yes: NPRS scale: 6/10 current, 9/10 worst  Pain location: medial aspect of right knee Pain description: sharp, achy Aggravating factors: everything, sitting for a long time, waking up Relieving factors: Heat,  tylenol   PRECAUTIONS: None  RED FLAGS: None   WEIGHT BEARING RESTRICTIONS: No  FALLS:  Has patient fallen in last 6 months?  No falls but some trips because he feels like the knee is giving out  LIVING ENVIRONMENT: Lives with: lives with their family and lives alone Lives in: House/apartment Stairs: No Has following equipment at home: None  OCCUPATION: N/A   PLOF: Independent  PATIENT GOALS: Living life without pain and not hobbling around.    OBJECTIVE:  Note: Objective measures were completed at Evaluation unless otherwise noted.  PATIENT SURVEYS:  LEFS 5/80 ( 6.3%)  COGNITION: Overall cognitive status: Within functional limits for tasks assessed    MUSCLE LENGTH: Hamstrings: very tight, restricted past 90   POSTURE: weight shift left  PALPATION: Tenderness to medial knee, hamstring, and gluteal region on right side.   LOWER EXTREMITY ROM:  Active ROM Right eval Left eval  Hip flexion    Hip extension    Hip abduction    Hip adduction    Hip internal rotation    Hip external rotation    Knee flexion 70 95  Knee extension 0 0  Ankle dorsiflexion    Ankle plantarflexion    Ankle inversion    Ankle eversion     (Blank rows = not tested)  LOWER EXTREMITY MMT:  MMT Right eval Left eval  Hip flexion 4- 4-  Hip extension    Hip abduction 3 3  Hip adduction    Hip internal rotation    Hip external rotation    Knee flexion 3 3  Knee extension 3 3  Ankle dorsiflexion 4- 4-  Ankle plantarflexion 4- 4-  Ankle inversion    Ankle eversion     (Blank rows = not tested)  LOWER EXTREMITY SPECIAL TESTS:  SLR: negative  FUNCTIONAL TESTS:  30 seconds chair stand test: 3  GAIT: Distance walked: 60 ft Assistive device utilized: None Level of assistance: Complete Independence Comments: Weight shift to left, limited hip flexion, limited                                                                                                                                TREATMENT DATE:  11/19/2023 LAQ 2x10 BL Supine knee to chest 2x10 BL Sidelying clamshell GTB 2x10  LAQ 1x15 BL Step ups 4" box 2x10 BL  Side stepping x5 laps  Sit to stands 2x10  with GTB and airex pad for control  11/07/2023 Seated hamstring stretch  LAQ  Supine clamshell GTB  Supine quad set   PATIENT EDUCATION:  Education details: Exam findings, POC, HEP.  Person educated: Patient Education method: Explanation, Demonstration, Tactile cues, Verbal cues, and Handouts Education comprehension: verbalized understanding, returned demonstration, tactile cues required, and needs further education  HOME EXERCISE PROGRAM: Access Code: [email or web address]  ASSESSMENT:  CLINICAL IMPRESSION: Patient responded well to treatment with no adverse effects. Patient arrives with less of an antalgic gait than last session. Still reports lateral knee pain on the right side but notices less instability. Therapy today continued to progress on LE strengthening of the quad and glutes today. Introduced functional glute training with side stepping. Worked on control of sit to stands - patient needs reminders to not externally rotate feet or allow the knees to move into valgus during movement. Patient continues to have strength deficits in the L in comparison to the R but both legs need strengthening. No changes made to HEP today, look at next session. Overall, patient would benefit from skilled PT to increase LE strength, endurance, and stability while decreasing pain and increasing functional ability.   EVAL: Patient is a 58 y.o. male who was seen today for physical therapy evaluation and treatment for right knee pain. Patient arrives with an antalgic gait and limited WB on the right LE. Patient has functional limitations due to the pain and muscle endurance, scoring a 3 on 30-second sit to stands which is below the age norm. Patient lacks LE strength in both right and left leg with the R>L which makes gait  challenging. Community ambulation limited due to challenges with sit to stands, gait deficits, and limited endurance. ROM restrictions in both knees impact gait disturbances. Overall, patient would benefit from skilled PT to increase LE strength, endurance, and stability while decreasing pain and increasing functional ability.   OBJECTIVE IMPAIRMENTS: Abnormal gait, decreased activity tolerance, decreased ROM, decreased strength, and pain.   ACTIVITY LIMITATIONS: carrying, lifting, bending, sitting, standing, squatting, sleeping, stairs, transfers, and locomotion level  PARTICIPATION LIMITATIONS: community activity  PERSONAL FACTORS: Fitness, Past/current experiences, and Time since onset of injury/illness/exacerbation are also affecting patient's functional outcome.   REHAB POTENTIAL: Good  CLINICAL DECISION MAKING: Stable/uncomplicated  EVALUATION COMPLEXITY: Low   GOALS: Goals reviewed with patient? Yes  SHORT TERM GOALS: Target date: 12/05/2023  Patient will be I with initial HEP in order to progress with therapy. Baseline: Goal status: INITIAL  2.  Patient will score </= 6/10 at worse on the NPS by 3rd visit in order to show an improvement in pain allowing for an increase in functional ability.   Baseline: 9/10 Goal status: INITIAL  LONG TERM GOALS: Target date: 01/02/2024  Patient will be independent with final HEP in order to continue treatment at home for pain tolerance.  Baseline:  Goal status: INITIAL  2.  Patient will score </= 3/10 at worse on the NPS to show an improvement in pain allowing for an increase in functional ability.   Baseline: 9/10 Goal status: INITIAL  3.  Patient will increase right knee flexion ROM by at least 20 degrees to increase functional ability.  Baseline: 70 deg Goal status: INITIAL  4.  Patient will increase strength to a 4/5 to show an increase in LE strength to increase functional ability.  Baseline: see above Goal status:  INITIAL  5.  Patient will score at least a 20/80 on LEFS based on MCID guidelines to  show improvement in functional ability.  Baseline: 5/80 Goal status: INITIAL  6. Patient will increase 30 second sit to stand score to at least 6 reps in order to increase functional ability and endurance.   Baseline: 3   Goal status: INITIAL PLAN:  PT FREQUENCY: 2x/week  PT DURATION: 8 weeks  PLANNED INTERVENTIONS: 97164- PT Re-evaluation, 97110-Therapeutic exercises, 97530- Therapeutic activity, 97112- Neuromuscular re-education, 97535- Self Care, 60454- Manual therapy, [phone]- Gait training, Patient/Family education, Balance training, Stair training, Dry Needling, Joint mobilization, Joint manipulation, Cryotherapy, and Moist heat  PLAN FOR NEXT SESSION: Review/revise HEP, progress LE strengthening, stairs/sitting training.    Erin Hearing, Student-PT 11/19/2023, 9:46 AM

## 2023-11-20 ENCOUNTER — Ambulatory Visit: Payer: Medicaid Other

## 2023-11-20 VITALS — Ht 66.0 in | Wt 236.0 lb

## 2023-11-20 DIAGNOSIS — Z0000 Encounter for general adult medical examination without abnormal findings: Secondary | ICD-10-CM | POA: Diagnosis not present

## 2023-11-20 NOTE — Patient Instructions (Addendum)
Wayne Wolfe , Thank you for taking time to come for your Medicare Wellness Visit. I appreciate your ongoing commitment to your health goals. Please review the following plan we discussed and let me know if I can assist you in the future.   Referrals/Orders/Follow-Ups/Clinician Recommendations: Yes; Keep maintaining your health by keeping your appointments with Dr. Benito Mccreedy and any specialists that you may see.  Call us if you need anything.  Have a great year!!!!  This is a list of the screening recommended for you and due dates:  Health Maintenance  Topic Date Due   Zoster (Shingles) Vaccine (1 of 2) Never done   Eye exam for diabetics  12/23/2013   COVID-19 Vaccine (8 - 2024-25 season) 08/29/2023   Hemoglobin A1C  11/20/2023   Lipid (cholesterol) test  02/14/2024   Yearly kidney health urinalysis for diabetes  04/23/2024   Complete foot exam   04/23/2024   Yearly kidney function blood test for diabetes  10/15/2024   Medicare Annual Wellness Visit  11/19/2024   DTaP/Tdap/Td vaccine (2 - Td or Tdap) 07/25/2028   Colon Cancer Screening  01/25/2030   Pneumococcal Vaccination  Completed   Flu Shot  Completed   Hepatitis C Screening  Completed   HIV Screening  Completed   HPV Vaccine  Aged Out    Advanced directives: (Copy Requested) Please bring a copy of your health care power of attorney and living will to the office to be added to your chart at your convenience.  Next Medicare Annual Wellness Visit scheduled for next year: Yes

## 2023-11-20 NOTE — Progress Notes (Signed)
Subjective:   Wayne Wolfe is a 58 y.o. male who presents for Medicare Annual/Subsequent preventive examination.  Visit Complete: Virtual I connected with  [name] and niece per DPR on 11/20/23 by a audio enabled telemedicine application and verified that I am speaking with the correct person using two identifiers.  Patient Location: Home  Provider Location: Home Office  I discussed the limitations of evaluation and management by telemedicine. The patient expressed understanding and agreed to proceed.  Vital Signs: Because this visit was a virtual/telehealth visit, some criteria may be missing or patient reported. Any vitals not documented were not able to be obtained and vitals that have been documented are patient reported.  This patient declined Interactive audio and Acupuncturist. Therefore the visit was completed with audio only.  Cardiac Risk Factors include: advanced age (>53men, >39 women);male gender;diabetes mellitus;dyslipidemia;family history of premature cardiovascular disease;hypertension     Objective:    Today's Vitals   11/20/23 0916  Weight: 236 lb (107 kg)  Height: 5\' 6"  (1.676 m)  PainSc: 3   PainLoc: Knee   Body mass index is 38.09 kg/m.     11/20/2023    9:19 AM 05/29/2023    8:49 AM 02/20/2023    2:40 PM 12/25/2022   11:45 AM 12/25/2022    8:55 AM 08/30/2022    9:47 AM 06/19/2022    2:44 PM  Advanced Directives  Does Patient Have a Medical Advance Directive? Yes No No No No No No  Type of Estate agent of [place];Living will        Does patient want to make changes to medical advance directive?  Yes (MAU/Ambulatory/Procedural Areas - Information given)       Copy of Healthcare Power of Attorney in Chart? No - copy requested No - copy requested       Would patient like information on creating a medical advance directive?    No - Patient declined Yes (MAU/Ambulatory/Procedural Areas - Information given) No -  Patient declined Yes (MAU/Ambulatory/Procedural Areas - Information given)    Current Medications (verified) Outpatient Encounter Medications as of 11/20/2023  Medication Sig   Accu-Chek FastClix Lancets MISC Check blood sugar before meals and after meals   acetaminophen (TYLENOL) 500 MG tablet Take 1,000 mg by mouth every 6 (six) hours as needed for fever.   bictegravir-emtricitabine-tenofovir AF (BIKTARVY) 50-200-25 MG TABS tablet Take 1 tablet by mouth daily.   Blood Glucose Monitoring Suppl (ACCU-CHEK GUIDE) w/Device KIT 1 each by Does not apply route 6 (six) times daily.   Continuous Glucose Sensor (DEXCOM G7 SENSOR) MISC Change sensor every 10 days or as directed by your provider (for diabetes).   diclofenac Sodium (VOLTAREN) 1 % GEL Apply 4 grams topically 4 (four) times daily as needed   empagliflozin (JARDIANCE) 10 MG TABS tablet Take 1 tablet (10 mg total) by mouth daily before breakfast.   fexofenadine (ALLEGRA) 180 MG tablet TAKE 1 TABLET(180 MG) BY MOUTH DAILY   gabapentin (NEURONTIN) 300 MG capsule Take 1 capsule (300 mg total) by mouth 3 (three) times daily.   glucose blood (ACCU-CHEK GUIDE) test strip Check blood sugar before meals and after meals   insulin glargine (LANTUS) 100 UNIT/ML Solostar Pen Inject 30 Units into the skin daily.   Insulin Pen Needle (PEN NEEDLES) 32G X 4 MM MISC Use daily as directed.   lisinopril (ZESTRIL) 20 MG tablet Take 1 tablet (20 mg total) by mouth daily.   metFORMIN (GLUCOPHAGE-XR) 500  MG 24 hr tablet Take 2 tablets (1,000 mg total) by mouth 2 (two) times daily with a meal.   metoprolol tartrate (LOPRESSOR) 100 MG tablet Take 1 tablet by mouth two hours prior to scan   rosuvastatin (CRESTOR) 20 MG tablet TAKE 1 TABLET(20 MG) BY MOUTH DAILY   Semaglutide, 2 MG/DOSE, (OZEMPIC, 2 MG/DOSE,) 8 MG/3ML SOPN Inject 2 mg into the skin once a week.   No facility-administered encounter medications on file as of 11/20/2023.    Allergies  (verified) Patient has no known allergies.   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   Hypertension 2009   Well controlled on HCTZ   Hypokalemia 01/12/2019   Knee effusion, right 07/19/2017   Lobar pneumonia (HCC) 01/12/2019   Obesity 10/19/2020   Obstructive sleep apnea 10/19/2020   Osteoarthritis 10/16/2023   Otitis media 03/13/2017   Polyuria 03/13/2017   Poorly controlled diabetes mellitus (HCC) 03/13/2017   Type 2 diabetes mellitus (HCC)    Well controlled on metformin   Urine abnormality 07/04/2023   Vaccine counseling 07/04/2023   Weight loss 03/13/2017   Past Surgical History:  Procedure Laterality Date   CARPAL TUNNEL RELEASE Right 1990s   none     Family History  Problem Relation Age of Onset   Diabetes Mellitus II Sister    Colon polyps Sister    Diabetes Mellitus II Mother    Coronary artery disease Mother 55       CABGx6   Coronary artery disease Sister 85       CABG   Diabetes Mellitus II Sister    Coronary artery disease Sister    Diabetes Mellitus II Sister    Colon cancer Neg Hx    Esophageal cancer Neg Hx    Rectal cancer Neg Hx    Stomach cancer Neg Hx    Social History   Socioeconomic History   Marital status: Single    Spouse name: Not on file   Number of children: Not on file   Years of education: 13   Highest education level: Not on file  Occupational History    Employer: UNEMPLOYED  Tobacco Use   Smoking status: Former    Current packs/day: 0.00    Types: Cigarettes    Quit date: 2003    Years since quitting: 22.1   Smokeless tobacco: Never   Vaping Use   Vaping status: Never Used  Substance and Sexual Activity   Alcohol use: No    Alcohol/week: 0.0 standard drinks of alcohol    Comment: Last used 2004   Drug use: No    Comment: Last used 2003   Sexual activity: Yes    Partners: Male    Comment: declined condoms  Other Topics Concern   Not on file  Social History Narrative   On disability. Lives in [place].    Social Drivers of Health   Financial Resource Strain: Medium Risk (11/20/2023)   Overall Financial Resource Strain (CARDIA)    Difficulty of Paying Living Expenses: Somewhat hard  Food Insecurity: Food Insecurity Present (11/20/2023)   Hunger Vital Sign  Worried About Programme researcher, broadcasting/film/video in the Last Year: Sometimes true    [employer] of Food in the Last Year: Sometimes true  Transportation Needs: No Transportation Needs (11/20/2023)   PRAPARE - Administrator, Civil Service (Medical): No    Lack of Transportation (Non-Medical): No  Physical Activity: Inactive (11/20/2023)   Exercise Vital Sign    Days of Exercise per Week: 0 days    Minutes of Exercise per Session: 0 min  Stress: Stress Concern Present (11/20/2023)   [employer] of Occupational Health - Occupational Stress Questionnaire    Feeling of Stress : To some extent  Social Connections: Moderately Integrated (11/20/2023)   Social Connection and Isolation Panel [NHANES]    Frequency of Communication with Friends and Family: More than three times a week    Frequency of Social Gatherings with Friends and Family: Three times a week    Attends Religious Services: More than 4 times per year    Active Member of Clubs or Organizations: Yes    Attends Banker Meetings: 1 to 4 times per year    Marital Status: Never married    Tobacco Counseling Counseling given: Not Answered   Clinical Intake:  Pre-visit preparation completed: Yes  Pain Score: 3  Pain Type: Chronic pain Pain Location: Knee Pain Orientation: Left,  Right Pain Descriptors / Indicators: Aching     BMI - recorded: 38.09 Nutritional Status: BMI > 30  Obese Nutritional Risks: None Diabetes: Yes CBG done?: No Did pt. bring in CBG monitor from home?: No  How often do you need to have someone help you when you read instructions, pamphlets, or other written materials from your doctor or pharmacy?: 1 - Never What is the last grade level you completed in school?: HSG; SOME COLLEGE  Interpreter Needed?: No  Information entered by :: Susie Cassette, LPN.   Activities of Daily Living    11/20/2023    9:22 AM 04/24/2023    8:31 AM  In your present state of health, do you have any difficulty performing the following activities:  Hearing? 0 0  Vision? 0 0  Difficulty concentrating or making decisions? 0 0  Walking or climbing stairs? 0 1  Dressing or bathing? 0 0  Doing errands, shopping? 0 0  Preparing Food and eating ? N   Using the Toilet? N   In the past six months, have you accidently leaked urine? N   Do you have problems with loss of bowel control? N   Managing your Medications? N   Managing your Finances? N   Housekeeping or managing your Housekeeping? N     Patient Care Team: Marrianne Mood, MD as PCP - General Nahser, Deloris Ping, MD as PCP - Cardiology (Cardiology) Daiva Eves, Lisette Grinder, MD as Consulting Physician (Infectious Diseases)  Indicate any recent Medical Services you may have received from other than [facility] providers in the past year (date may be approximate).     Assessment:   This is a routine wellness examination for Daved.  Hearing/Vision screen Hearing Screening - Comments:: Denies hearing difficulties.   Vision Screening - Comments:: Wears rx glasses - up to date with routine eye exams.    Goals Addressed             This Visit's Progress    Client understands the importance of follow-up with providers by attending scheduled visits        Depression Screen    11/20/2023  9:22 AM  10/16/2023    9:27 AM 04/24/2023    8:47 AM 02/20/2023    2:39 PM 12/25/2022   11:45 AM 12/25/2022    8:55 AM 08/30/2022    9:46 AM  PHQ 2/9 Scores  PHQ - 2 Score  0 0 0 0 0 0  PHQ- 9 Score   4 0 0 0   Exception Documentation Other- indicate reason in comment box        Not completed Patient not available to answer.          Fall Risk    11/20/2023    9:20 AM 10/16/2023    9:27 AM 08/20/2023    8:26 AM 04/24/2023    8:31 AM 12/25/2022   11:45 AM  Fall Risk   Falls in the past year? 0 0 0 0 0  Number falls in past yr: 0  0 0 0  Injury with Fall? 0  0 0 0  Risk for fall due to : No Fall Risks No Fall Risks  No Fall Risks No Fall Risks  Follow up Falls prevention discussed;Falls evaluation completed Falls evaluation completed Falls evaluation completed Falls evaluation completed Falls evaluation completed;Falls prevention discussed    MEDICARE RISK AT HOME: Medicare Risk at Home Any stairs in or around the home?: No (3 STEPS BACK [facility]) If so, are there any without handrails?: No Home free of loose throw rugs in walkways, pet beds, electrical cords, etc?: Yes Adequate lighting in your home to reduce risk of falls?: Yes Life alert?: No Use of a cane, walker or w/c?: No Grab bars in the bathroom?: No Shower chair or bench in shower?: No Elevated toilet seat or a handicapped toilet?: No  TIMED UP AND GO:  Was the test performed?  No    Cognitive Function:    11/20/2023    9:22 AM  MMSE - Mini Mental State Exam  Not completed: Unable to complete        12/25/2022   11:46 AM  6CIT Screen  What Year? 0 points  What month? 0 points  What time? 0 points  Count back from 20 0 points  Months in reverse 0 points  Repeat phrase 0 points  Total Score 0 points    Immunizations Immunization History  Administered Date(s) Administered   H1N1 10/04/2008   Hepatitis A 08/24/2008, 12/06/2009   Hepatitis A, Ped/Adol-2 Dose 08/24/2008, 12/06/2009   Hepatitis B 04/08/2008,  08/24/2008, 12/17/2008   Hepatitis B, PED/ADOLESCENT 04/08/2008, 08/24/2008, 12/17/2008   Influenza Split 07/14/2008, 08/02/2009, 07/10/2010, 08/13/2011, 06/25/2012   Influenza Whole 07/14/2008, 08/02/2009, 07/10/2010   Influenza, Seasonal, Injecte, Preservative Fre 07/04/2023   Influenza,inj,Quad PF,6+ Mos 06/15/2013, 07/29/2014, 07/28/2015, 07/26/2016, 07/17/2017, 06/19/2018, 06/11/2019, 07/12/2020, 06/19/2022   Influenza-Unspecified 08/13/2011, 06/25/2012, 06/15/2013, 07/29/2014, 07/28/2015, 07/26/2016, 07/17/2017, 06/19/2018, 06/11/2019, 08/31/2021   Meningococcal Conjugate 10/04/2016, 11/29/2016   Meningococcal Mcv4o 10/04/2016, 11/29/2016   Novel Infuenza-h1n1-09 10/04/2008   PFIZER Comirnaty(Gray Top)Covid-19 Tri-Sucrose Vaccine 04/13/2021   PFIZER(Purple Top)SARS-COV-2 Vaccination 12/16/2019, 01/06/2020, 10/07/2020   PNEUMOCOCCAL CONJUGATE-20 10/30/2021   Pfizer Covid-19 Vaccine Bivalent Booster 64yrs & up 09/20/2021   Pfizer(Comirnaty)Fall Seasonal Vaccine 12 years and older 08/08/2022, 07/04/2023   Pneumococcal Conjugate-13 06/19/2018   Pneumococcal Polysaccharide-23 04/08/2008, 02/03/2013   Tdap 07/25/2018    TDAP status: Up to date  Flu Vaccine status: Up to date  Pneumococcal vaccine status: Up to date  Covid-19 vaccine status: Completed vaccines  Qualifies for Shingles Vaccine? Yes   Zostavax completed No   Shingrix  Completed?: No.    Education has been provided regarding the importance of this vaccine. Patient has been advised to call insurance company to determine out of pocket expense if they have not yet received this vaccine. Advised may also receive vaccine at local pharmacy or Health Dept. Verbalized acceptance and understanding.  Screening Tests Health Maintenance  Topic Date Due   Zoster Vaccines- Shingrix (1 of 2) Never done   OPHTHALMOLOGY EXAM  12/23/2013   COVID-19 Vaccine (8 - 2024-25 season) 08/29/2023   HEMOGLOBIN A1C  11/20/2023   LIPID PANEL   02/14/2024   Diabetic kidney evaluation - Urine ACR  04/23/2024   FOOT EXAM  04/23/2024   Diabetic kidney evaluation - eGFR measurement  10/15/2024   Medicare Annual Wellness (AWV)  11/19/2024   DTaP/Tdap/Td (2 - Td or Tdap) 07/25/2028   Colonoscopy  01/25/2030   Pneumococcal Vaccine 32-55 Years old  Completed   INFLUENZA VACCINE  Completed   Hepatitis C Screening  Completed   HIV Screening  Completed   HPV VACCINES  Aged Out    Health Maintenance  Health Maintenance Due  Topic Date Due   Zoster Vaccines- Shingrix (1 of 2) Never done   OPHTHALMOLOGY EXAM  12/23/2013   COVID-19 Vaccine (8 - 2024-25 season) 08/29/2023   HEMOGLOBIN A1C  11/20/2023    Colorectal cancer screening: Type of screening: Colonoscopy. Completed 01/26/2020. Repeat every 10 years  Lung Cancer Screening: (Low Dose CT Chest recommended if Age 18-80 years, 20 pack-year currently smoking OR have quit w/in 15years.) does not qualify.   Lung Cancer Screening Referral: no  Additional Screening:  Hepatitis C Screening: does qualify; Completed 02/14/2023  Vision Screening: Recommended annual ophthalmology exams for early detection of glaucoma and other disorders of the eye. Is the patient up to date with their annual eye exam?  No  Who is the provider or what is the name of the office in which the patient attends annual eye exams? N/A If pt is not established with a provider, would they like to be referred to a provider to establish care? No .   Dental Screening: Recommended annual dental exams for proper oral hygiene  Diabetic Foot Exam: Diabetic Foot Exam: Completed 04/24/2023  Community Resource Referral / Chronic Care Management: CRR required this visit?  No   CCM required this visit?  No     Plan:     I have personally reviewed and noted the following in the patient's chart:   Medical and social history Use of alcohol, tobacco or illicit drugs  Current medications and supplements including opioid  prescriptions. Patient is currently taking opioid prescriptions. Information provided to patient regarding non-opioid alternatives. Patient advised to discuss non-opioid treatment plan with their provider. Functional ability and status Nutritional status Physical activity Advanced directives List of other physicians Hospitalizations, surgeries, and ER visits in previous 12 months Vitals Screenings to include cognitive, depression, and falls Referrals and appointments  In addition, I have reviewed and discussed with patient certain preventive protocols, quality metrics, and best practice recommendations. A written personalized care plan for preventive services as well as general preventive health recommendations were provided to patient.     Mickeal Needy, LPN   1/61/0960   After Visit Summary: (Declined) Due to this being a telephonic visit, with patients personalized plan was offered to patient but patient Declined AVS at this time   Nurse Notes: Patient is due for an eye exam, HgA1C and Shingrix vaccine. This visit was completed by  niece, listed on file.

## 2023-11-21 ENCOUNTER — Ambulatory Visit: Payer: Medicaid Other

## 2023-11-26 ENCOUNTER — Telehealth: Payer: Self-pay

## 2023-11-26 ENCOUNTER — Encounter: Payer: Medicaid Other | Admitting: Student

## 2023-11-26 ENCOUNTER — Ambulatory Visit: Payer: Medicaid Other

## 2023-11-26 NOTE — Therapy (Incomplete)
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   Hypertension 2009   Well controlled on HCTZ   Hypokalemia 01/12/2019   Knee effusion, right 07/19/2017   Lobar pneumonia (HCC) 01/12/2019   Obesity 10/19/2020   Obstructive sleep apnea 10/19/2020   Osteoarthritis 10/16/2023   Otitis media 03/13/2017   Polyuria 03/13/2017   Poorly controlled diabetes mellitus (HCC) 03/13/2017   Type 2 diabetes mellitus (HCC)    Well controlled on metformin   Urine abnormality 07/04/2023   Vaccine counseling 07/04/2023   Weight loss 03/13/2017   Past Surgical History:  Procedure Laterality Date   CARPAL TUNNEL RELEASE Right 1990s   none     Patient Active Problem List   Diagnosis Date Noted   Osteoarthritis 10/16/2023   Vaccine counseling 07/04/2023   Urine abnormality 07/04/2023   Chest pain 12/25/2022   Knee pain 06/20/2022   Environmental allergies 02/16/2022   Healthcare maintenance 12/04/2021   Obstructive sleep apnea 10/19/2020   Morbid obesity (HCC) 10/19/2020   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   Multiple joint pain 02/11/2020   Syphilis 12/14/2019   Unprotected sexual intercourse 12/01/2019   Pulmonary nodule 12/26/2017   Routine screening for STI (sexually transmitted infection) 10/16/2017   Otitis media 03/13/2017   Polyuria 03/13/2017   Colon cancer screening 02/24/2016   Testicular pain 01/20/2016   Numbness and tingling of both feet  08/26/2013   Keloid of skin 06/25/2012   Type 2 diabetes mellitus with other specified complication (HCC) 03/12/2011   Hyperlipidemia associated with type 2 diabetes mellitus (HCC) 03/12/2011   Hypertension associated with diabetes (HCC) 04/20/2008   Human immunodeficiency virus (HIV) disease (HCC) 04/15/2008    PCP: Marrianne Mood, MD  REFERRING PROVIDER: Gust Rung, DO  REFERRING DIAG: [phone] (ICD-10-CM) - Acute pain of right knee   THERAPY DIAG:  No diagnosis found.  Rationale for Evaluation and Treatment: Rehabilitation  ONSET DATE: Chronic   SUBJECTIVE:   SUBJECTIVE STATEMENT: ***  EVAL: Patient comes in with c/c of right knee pain. Biggest factors are pain and stiffness. Wakes up with a lot of knee stiffness that gets better as he moves but does not fully leave. No numbness and tingling in the hip or knee, some in the calf or foot due to diabetes. Patient reports he fatigues easily and has leg pain that requires him to sit down when walking long distance and with community ambulation.  PERTINENT HISTORY: Type 2 diabetes  PAIN:  Are you having pain? Yes: NPRS scale: 6/10 current, 9/10 worst  Pain location: medial aspect of right knee Pain description: sharp, achy Aggravating factors: everything, sitting for a long time, waking up Relieving factors: Heat, tylenol   PRECAUTIONS: None  RED FLAGS: None   WEIGHT BEARING RESTRICTIONS: No  FALLS:  Has patient fallen in last 6 months?  No falls but some trips because he feels like the knee is giving out  LIVING ENVIRONMENT: Lives with: lives with their  family and lives alone Lives in: House/apartment Stairs: No Has following equipment at home: None  OCCUPATION: N/A   PLOF: Independent  PATIENT GOALS: Living life without pain and not hobbling around.    OBJECTIVE:  Note: Objective measures were completed at Evaluation unless otherwise noted.  PATIENT SURVEYS:  LEFS 5/80 (  6.3%)  COGNITION: Overall cognitive status: Within functional limits for tasks assessed    MUSCLE LENGTH: Hamstrings: very tight, restricted past 90   POSTURE: weight shift left  PALPATION: Tenderness to medial knee, hamstring, and gluteal region on right side.   LOWER EXTREMITY ROM:  Active ROM Right eval Left eval  Hip flexion    Hip extension    Hip abduction    Hip adduction    Hip internal rotation    Hip external rotation    Knee flexion 70 95  Knee extension 0 0  Ankle dorsiflexion    Ankle plantarflexion    Ankle inversion    Ankle eversion     (Blank rows = not tested)  LOWER EXTREMITY MMT:  MMT Right eval Left eval  Hip flexion 4- 4-  Hip extension    Hip abduction 3 3  Hip adduction    Hip internal rotation    Hip external rotation    Knee flexion 3 3  Knee extension 3 3  Ankle dorsiflexion 4- 4-  Ankle plantarflexion 4- 4-  Ankle inversion    Ankle eversion     (Blank rows = not tested)  LOWER EXTREMITY SPECIAL TESTS:  SLR: negative  FUNCTIONAL TESTS:  30 seconds chair stand test: 3  GAIT: Distance walked: 60 ft Assistive device utilized: None Level of assistance: Complete Independence Comments: Weight shift to left, limited hip flexion, limited                                                                                                                               TREATMENT DATE:  11/26/2023 LAQ 2x10 BL Supine knee to chest 2x10 BL Sidelying clamshell GTB 2x10  LAQ 1x15 BL Step ups 4" box 2x10 BL  Side stepping x5 laps  Sit to stands 2x10 with GTB and airex pad for control   11/19/2023 LAQ 2x10 BL Supine knee to chest 2x10 BL Sidelying clamshell GTB 2x10  LAQ 1x15 BL Step ups 4" box 2x10 BL  Side stepping x5 laps  Sit to stands 2x10 with GTB and airex pad for control  11/07/2023 Seated hamstring stretch  LAQ  Supine clamshell GTB  Supine quad set   PATIENT EDUCATION:  Education details: Exam findings, POC, HEP.   Person educated: Patient Education method: Explanation, Demonstration, Tactile cues, Verbal cues, and Handouts Education comprehension: verbalized understanding, returned demonstration, tactile cues required, and needs further education  HOME EXERCISE PROGRAM: Access Code: [email or web address]  ASSESSMENT:  CLINICAL IMPRESSION: ***  EVAL: Patient is a 58 y.o. male who was seen today for physical therapy evaluation and treatment for right  knee pain. Patient arrives with an antalgic gait and limited WB on the right LE. Patient has functional limitations due to the pain and muscle endurance, scoring a 3 on 30-second sit to stands which is below the age norm. Patient lacks LE strength in both right and left leg with the R>L which makes gait challenging. Community ambulation limited due to challenges with sit to stands, gait deficits, and limited endurance. ROM restrictions in both knees impact gait disturbances. Overall, patient would benefit from skilled PT to increase LE strength, endurance, and stability while decreasing pain and increasing functional ability.   OBJECTIVE IMPAIRMENTS: Abnormal gait, decreased activity tolerance, decreased ROM, decreased strength, and pain.   ACTIVITY LIMITATIONS: carrying, lifting, bending, sitting, standing, squatting, sleeping, stairs, transfers, and locomotion level  PARTICIPATION LIMITATIONS: community activity  PERSONAL FACTORS: Fitness, Past/current experiences, and Time since onset of injury/illness/exacerbation are also affecting patient's functional outcome.   REHAB POTENTIAL: Good  CLINICAL DECISION MAKING: Stable/uncomplicated  EVALUATION COMPLEXITY: Low   GOALS: Goals reviewed with patient? Yes  SHORT TERM GOALS: Target date: 12/05/2023  Patient will be I with initial HEP in order to progress with therapy. Baseline: Goal status: INITIAL  2.  Patient will score </= 6/10 at worse on the NPS by 3rd visit in order to show an improvement in pain  allowing for an increase in functional ability.   Baseline: 9/10 Goal status: INITIAL  LONG TERM GOALS: Target date: 01/02/2024  Patient will be independent with final HEP in order to continue treatment at home for pain tolerance.  Baseline:  Goal status: INITIAL  2.  Patient will score </= 3/10 at worse on the NPS to show an improvement in pain allowing for an increase in functional ability.   Baseline: 9/10 Goal status: INITIAL  3.  Patient will increase right knee flexion ROM by at least 20 degrees to increase functional ability.  Baseline: 70 deg Goal status: INITIAL  4.  Patient will increase strength to a 4/5 to show an increase in LE strength to increase functional ability.  Baseline: see above Goal status: INITIAL  5.  Patient will score at least a 20/80 on LEFS based on MCID guidelines to show improvement in functional ability.  Baseline: 5/80 Goal status: INITIAL  6. Patient will increase 30 second sit to stand score to at least 6 reps in order to increase functional ability and endurance.   Baseline: 3   Goal status: INITIAL PLAN:  PT FREQUENCY: 2x/week  PT DURATION: 8 weeks  PLANNED INTERVENTIONS: 97164- PT Re-evaluation, 97110-Therapeutic exercises, 97530- Therapeutic activity, 97112- Neuromuscular re-education, 97535- Self Care, 78469- Manual therapy, [phone]- Gait training, Patient/Family education, Balance training, Stair training, Dry Needling, Joint mobilization, Joint manipulation, Cryotherapy, and Moist heat  PLAN FOR NEXT SESSION: Review/revise HEP, progress LE strengthening, stairs/sitting training.    Eloy End, PT 11/26/2023, 7:54 AM

## 2023-11-26 NOTE — Telephone Encounter (Signed)
 PT called and left voicemail for patient regarding missed visit today. Left reminder of attendance policy and of next scheduled appointment.  EDI GORNIAK   11/26/23 9:16 AM

## 2023-11-28 ENCOUNTER — Ambulatory Visit: Payer: Medicaid Other | Admitting: Student

## 2023-11-28 VITALS — BP 127/83 | HR 95 | Temp 98.2°F | Wt 239.5 lb

## 2023-11-28 DIAGNOSIS — M79605 Pain in left leg: Secondary | ICD-10-CM

## 2023-11-28 MED ORDER — METHOCARBAMOL 750 MG PO TABS: ORAL_TABLET | ORAL | 0 refills | Status: DC

## 2023-11-28 NOTE — Patient Instructions (Addendum)
  Thank you, [name], for allowing [place] to provide your care today. Today we discussed . . .  > Leg pain       -I think your leg pain is most likely due to to chronic and progressive muscle strain especially with your right knee hurting and doing physical therapy.  I would like you to take Tylenol 1000 mg every 8 hours for the next week, use ice and heat over the area as needed, work with physical therapy for stretching and exercises, use topical Voltaren gel, lidocaine patches, or IcyHot products or similar.  I will also prescribe you a muscle relaxer called Robaxin which you should take 1500 mg every 6 hours for the next 3 days and then every 8 hours for the 4 days after that.  If this pain is not getting any better or if there is anything getting worse in the next few weeks please give [place] a call.   Follow up:  1 month for regular follow up visit     Remember:     Should you have any questions or concerns please call the internal medicine clinic at [phone].     Rocky Morel, DO [facility]

## 2023-11-28 NOTE — Progress Notes (Signed)
   CC: Acute Concern of left leg pain  HPI:  Wayne Wolfe is a 58 y.o. male with pertinent PMH of HTN, T2DM, OSA, HIV, and right knee osteoarthritis who presents as above. Please see assessment and plan below for further details.  Review of Systems:   Pertinent items noted in HPI and/or A&P.  Physical Exam:  Vitals:   11/28/23 1413  BP: 127/83  Pulse: 95  Temp: 98.2 F (36.8 C)  TempSrc: Oral  SpO2: 96%  Weight: 239 lb 8 oz (108.6 kg)    Constitutional: Well-appearing adult male. In no acute distress. HEENT: Normocephalic, atraumatic, Sclera non-icteric, PERRL, EOM intact Cardio:Regular rate and rhythm. 2+ bilateral radial and dorsalis pedis  pulses. Pulm: Normal work of breathing on room air. MSK: Trace lower extremity edema bilaterally and even.  Left leg without knee or ankle effusion, tenderness to palpation over the calf, skin color/temperature changes or rash, and strong dorsalis pedis pulse. Skin:Warm and dry. Neuro:Alert and oriented x3. No focal deficit noted. Psych:Pleasant mood and affect.   Assessment & Plan:   Pain of left lower extremity Patient presents with 3 weeks of intermittent left lateral calf pain that can occur randomly but usually gets worse with walking and better with rest.  No known mechanism of injury.  No other symptoms consistent with claudication and strong dorsalis pedis pulses, no radicular symptoms, no joint effusions, no rash or skin changes over the area, no new/worsening/uneven lower extremity edema, no muscle weakness, no reproduced pain on physical exam.  Overall this is most likely muscular pain that could be due to him favoring his left leg while undergoing physical therapy for his right knee for the past 4 weeks.  Due to significantly bothersome symptoms despite using appropriate doses of Tylenol and ibuprofen at home we will try a muscle relaxer. - Robaxin 1500 mg 4 times daily for 3 days and then 1500 mg 3 times daily    Patient  discussed with Dr. Alfonzo Beers, DO [facility] Internal Medicine Resident PGY-2 Clinic Phone: [phone] Pager: [phone]

## 2023-11-29 ENCOUNTER — Ambulatory Visit: Payer: Medicaid Other | Admitting: Physical Therapy

## 2023-11-29 ENCOUNTER — Telehealth: Payer: Self-pay | Admitting: Physical Therapy

## 2023-11-29 ENCOUNTER — Encounter: Payer: Medicaid Other | Admitting: Physical Therapy

## 2023-11-29 NOTE — Telephone Encounter (Signed)
 Attempted to contact patient due to 2nd consecutive no show. Left voicemail informing patient of no show and per attendance policy his remaining appointments will be canceled and he will need to contact the clinic to schedule another PT appointment and he will be able to schedule 1 visit at a time due to attendance.   Rosana Hoes, PT, DPT, LAT, ATC 11/29/23  9:17 AM Phone: [phone] Fax: [phone]

## 2023-12-02 ENCOUNTER — Other Ambulatory Visit (HOSPITAL_COMMUNITY): Payer: Self-pay

## 2023-12-02 ENCOUNTER — Telehealth: Payer: Self-pay | Admitting: Physical Therapy

## 2023-12-02 DIAGNOSIS — M79605 Pain in left leg: Secondary | ICD-10-CM | POA: Insufficient documentation

## 2023-12-02 NOTE — Assessment & Plan Note (Signed)
 Patient presents with 3 weeks of intermittent left lateral calf pain that can occur randomly but usually gets worse with walking and better with rest.  No known mechanism of injury.  No other symptoms consistent with claudication and strong dorsalis pedis pulses, no radicular symptoms, no joint effusions, no rash or skin changes over the area, no new/worsening/uneven lower extremity edema, no muscle weakness, no reproduced pain on physical exam.  Overall this is most likely muscular pain that could be due to him favoring his left leg while undergoing physical therapy for his right knee for the past 4 weeks.  Due to significantly bothersome symptoms despite using appropriate doses of Tylenol and ibuprofen at home we will try a muscle relaxer. - Robaxin 1500 mg 4 times daily for 3 days and then 1500 mg 3 times daily

## 2023-12-03 ENCOUNTER — Ambulatory Visit: Payer: Medicaid Other | Admitting: Physical Therapy

## 2023-12-04 NOTE — Progress Notes (Signed)
 Internal Medicine Clinic Attending  Case discussed with the resident at the time of the visit.  We reviewed the resident's history and exam and pertinent patient test results.  I agree with the assessment, diagnosis, and plan of care documented in the resident's note.

## 2023-12-05 ENCOUNTER — Ambulatory Visit: Payer: Medicaid Other | Admitting: Physical Therapy

## 2023-12-08 ENCOUNTER — Other Ambulatory Visit: Payer: Self-pay | Admitting: Infectious Disease

## 2023-12-08 DIAGNOSIS — B20 Human immunodeficiency virus [HIV] disease: Secondary | ICD-10-CM

## 2023-12-16 ENCOUNTER — Other Ambulatory Visit: Payer: Self-pay

## 2023-12-16 ENCOUNTER — Other Ambulatory Visit: Payer: Self-pay | Admitting: Student

## 2023-12-16 ENCOUNTER — Other Ambulatory Visit (HOSPITAL_COMMUNITY): Payer: Self-pay

## 2023-12-16 ENCOUNTER — Ambulatory Visit: Attending: Internal Medicine | Admitting: Physical Therapy

## 2023-12-16 ENCOUNTER — Encounter: Payer: Self-pay | Admitting: Physical Therapy

## 2023-12-16 DIAGNOSIS — G8929 Other chronic pain: Secondary | ICD-10-CM | POA: Diagnosis present

## 2023-12-16 DIAGNOSIS — M79605 Pain in left leg: Secondary | ICD-10-CM | POA: Diagnosis present

## 2023-12-16 DIAGNOSIS — M25561 Pain in right knee: Secondary | ICD-10-CM | POA: Insufficient documentation

## 2023-12-16 DIAGNOSIS — M6281 Muscle weakness (generalized): Secondary | ICD-10-CM | POA: Diagnosis present

## 2023-12-16 NOTE — Patient Instructions (Signed)
 Access Code: [email or web address] URL: [email or web address] Date: 12/16/2023 Prepared by: Rosana Hoes  Exercises - Clamshell  - 1 x daily - 3 sets - 10 reps - Active Straight Leg Raise with Quad Set  - 1 x daily - 3 sets - 5 reps - Seated Hamstring Stretch  - 2 x daily - 3 reps - 30 sec hold - Seated Long Arc Quad  - 1 x daily - 7 x weekly - 3 sets - 10 reps - Sit to Stand Without Arm Support  - 1 x daily - 3 sets - 5 reps

## 2023-12-16 NOTE — Therapy (Signed)
 OUTPATIENT PHYSICAL THERAPY TREATMENT   Patient Name: KAMRYN GAUTHIER MRN: [id number] DOB:[birth year], 58 y.o., male Today's Date: 12/16/2023   END OF SESSION:  PT End of Session - 12/16/23 1614     Visit Number 3    Number of Visits 17    Date for PT Re-Evaluation 02/10/24    Authorization Type [place] MEDICAID    Authorization - Number of Visits 27    PT Start Time 1540    PT Stop Time 1620    PT Time Calculation (min) 40 min    Activity Tolerance Patient tolerated treatment well    Behavior During Therapy WFL for tasks assessed/performed             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   Hypertension 2009   Well controlled on HCTZ   Hypokalemia 01/12/2019   Knee effusion, right 07/19/2017   Lobar pneumonia (HCC) 01/12/2019   Obesity 10/19/2020   Obstructive sleep apnea 10/19/2020   Osteoarthritis 10/16/2023   Otitis media 03/13/2017   Polyuria 03/13/2017   Poorly controlled diabetes mellitus (HCC) 03/13/2017   Type 2 diabetes mellitus (HCC)    Well controlled on metformin   Urine abnormality 07/04/2023   Vaccine counseling 07/04/2023   Weight loss 03/13/2017   Past Surgical History:  Procedure Laterality Date   CARPAL TUNNEL RELEASE Right 1990s   none     Patient Active Problem List   Diagnosis Date Noted   Pain of left lower extremity 12/02/2023   Osteoarthritis 10/16/2023   Vaccine counseling 07/04/2023   Urine abnormality 07/04/2023   Chest pain 12/25/2022   Knee pain 06/20/2022   Environmental allergies 02/16/2022   Healthcare maintenance 12/04/2021   Obstructive sleep  apnea 10/19/2020   Morbid obesity (HCC) 10/19/2020   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   Multiple joint pain 02/11/2020   Syphilis 12/14/2019   Unprotected sexual intercourse 12/01/2019   Pulmonary nodule 12/26/2017   Routine screening for STI (sexually transmitted infection) 10/16/2017   Otitis media 03/13/2017   Polyuria 03/13/2017   Colon cancer screening 02/24/2016   Testicular pain 01/20/2016   Numbness and tingling of both feet 08/26/2013   Keloid of skin 06/25/2012   Type 2 diabetes mellitus with other specified complication (HCC) 03/12/2011   Hyperlipidemia associated with type 2 diabetes mellitus (HCC) 03/12/2011   Hypertension associated with diabetes (HCC) 04/20/2008   Human immunodeficiency virus (HIV) disease (HCC) 04/15/2008    PCP: Marrianne Mood, MD  REFERRING PROVIDER: Gust Rung, DO; Dickie La, MD   REFERRING DIAG: Acute pain of right knee; Pain of left lower extremity   THERAPY DIAG:  Pain in left leg  Chronic pain of right knee  Muscle weakness (generalized)  Rationale for Evaluation and Treatment: Rehabilitation  ONSET DATE: Chronic   SUBJECTIVE:  SUBJECTIVE STATEMENT: Patient reports continued left leg pain for over a month that is different than his right leg. He had been taking a muscle relaxer and some tylenol to help. The left leg will bother him most when he is standing up a lot, but can also be painful if he  is sitting or lying down. There are times when he doesn't have pain.    EVAL: Patient comes in with c/c of right knee pain. Biggest factors are pain and stiffness. Wakes up with a lot of knee stiffness that gets better as he moves but does not fully leave. No numbness and tingling in the hip or knee, some in the calf or foot due to diabetes. Patient reports he fatigues easily and has leg pain that requires him to sit down when walking long distance and with community ambulation.  PERTINENT HISTORY: Type 2 diabetes   PAIN:  Are you  having pain? Yes:  NPRS scale: 8/10 current, 9/10 worst  Pain location: Left leg Pain description: Dull, burning, nagging Aggravating factors: Standing Relieving factors: Heat, tylenol   PRECAUTIONS: None  RED FLAGS: None   WEIGHT BEARING RESTRICTIONS: No  FALLS:  Has patient fallen in last 6 months?  No falls but some trips because he feels like the knee is giving out  LIVING ENVIRONMENT: Lives with: lives with their family and lives alone Lives in: House/apartment Stairs: No Has following equipment at home: None  OCCUPATION: N/A   PLOF: Independent  PATIENT GOALS: Living life without pain and not hobbling around.    OBJECTIVE:  Note: Objective measures were completed at Evaluation unless otherwise noted. PATIENT SURVEYS:  LEFS 5/80 ( 6.3%)  12/16/2023: 6/80  MUSCLE LENGTH: Hamstrings: very tight, restricted past 90   12/16/2023: < 45 deg SLR  POSTURE: weight shift left  PALPATION: Tenderness to medial knee, hamstring, and gluteal region on right side.   LOWER EXTREMITY ROM:  Active ROM Right eval Left eval Rt 12/16/2023  Hip flexion     Hip extension     Hip abduction     Hip adduction     Hip internal rotation     Hip external rotation     Knee flexion 70 95 80  Knee extension 0 0   Ankle dorsiflexion     Ankle plantarflexion     Ankle inversion     Ankle eversion      (Blank rows = not tested)  LOWER EXTREMITY MMT:  MMT Right eval Left eval Rt / Lt 12/16/2023  Hip flexion 4- 4- 4- / 4-  Hip extension     Hip abduction 3 3 3  / 3-  Hip adduction     Hip internal rotation     Hip external rotation     Knee flexion 3 3 4  / 4  Knee extension 3 3 4  / 4  Ankle dorsiflexion 4- 4-   Ankle plantarflexion 4- 4-   Ankle inversion     Ankle eversion      (Blank rows = not tested)  LOWER EXTREMITY SPECIAL TESTS:  SLR: negative  FUNCTIONAL TESTS:  30 seconds chair stand test: 3  12/16/2023: 6 reps  GAIT: Distance walked: 60 ft Assistive  device utilized: None Level of assistance: Complete Independence Comments: Weight shift to left, limited hip flexion, limited  TREATMENT DATE:  [facility] Adult PT Treatment:                                                DATE: 12/16/2023 NuStep L4 x 5 min with UE/LE to build endurance and workload capacity SLR 2 x 5 each Side clamshell 2 x 10 LAQ 2 x 10 each Seated hamstring stretch 2 x 30 sec each Sit to stand 3 x 5   PATIENT EDUCATION:  Education details: POC update, HEP update Person educated: Patient Education method: Explanation, Demonstration, Tactile cues, Verbal cues, and Handouts Education comprehension: verbalized understanding, returned demonstration, tactile cues required, and needs further education  HOME EXERCISE PROGRAM: Access Code: [email or web address]   ASSESSMENT: CLINICAL IMPRESSION: Patient tolerated therapy well with no adverse effects. He arrives with new PT referral for the left leg as he has been having pain from the left hip, knee, and lower leg for a little over a month that seems to be aggravated more with standing or walking. He does report slight improvement in functional status on LEFS this visit and demonstrates improvement in knee strength, but remains limited in his hip strength and hamstring flexibility. His 30 sec stand test has also improved. He reports improvement in the right knee pain, but currently reports high levels of pain of the left leg that impacts his functional ability. Updated his HEP to progress some LE strengthening. Patient would benefit from continued skilled PT to progress his mobility and strength in order to reduce pain and maximize functional ability.    EVAL: Patient is a 58 y.o. male who was seen today for physical therapy evaluation and treatment for right knee pain. Patient arrives with an antalgic gait and limited WB  on the right LE. Patient has functional limitations due to the pain and muscle endurance, scoring a 3 on 30-second sit to stands which is below the age norm. Patient lacks LE strength in both right and left leg with the R>L which makes gait challenging. Community ambulation limited due to challenges with sit to stands, gait deficits, and limited endurance. ROM restrictions in both knees impact gait disturbances. Overall, patient would benefit from skilled PT to increase LE strength, endurance, and stability while decreasing pain and increasing functional ability.   OBJECTIVE IMPAIRMENTS: Abnormal gait, decreased activity tolerance, decreased ROM, decreased strength, and pain.   ACTIVITY LIMITATIONS: carrying, lifting, bending, sitting, standing, squatting, sleeping, stairs, transfers, and locomotion level  PARTICIPATION LIMITATIONS: community activity  PERSONAL FACTORS: Fitness, Past/current experiences, and Time since onset of injury/illness/exacerbation are also affecting patient's functional outcome.    GOALS: Goals reviewed with patient? Yes  SHORT TERM GOALS: Target date: 01/13/2024  Patient will be I with initial HEP in order to progress with therapy. Baseline: 12/16/2023: progressing Goal status: ONGOING  2.  Patient will score </= 6/10 at worse on the NPS by 3rd visit in order to show an improvement in pain allowing for an increase in functional ability.   Baseline: 9/10 12/16/2023: 9/10 Goal status: ONGOING  LONG TERM GOALS: Target date: 02/10/2024  Patient will be independent with final HEP in order to continue treatment at home for pain tolerance.  Baseline:  12/16/2023: progressing Goal status: ONGOING  2.  Patient will score </= 3/10 at worse on the NPS to show an improvement in pain allowing for an increase in functional ability.  Baseline: 9/10 12/16/2023: 9/10 Goal status: ONGOING  3.  Patient will increase right knee flexion ROM by at least 20 degrees to increase  functional ability.  Baseline: 70 deg 12/16/2023: 80 deg Goal status: ONGOING  4.  Patient will increase strength to a 4/5 to show an increase in LE strength to increase functional ability.  Baseline: see above 12/16/2023: see limitations above Goal status: ONGOING  5.  Patient will score at least a 20/80 on LEFS based on MCID guidelines to show improvement in functional ability.  Baseline: 5/80 12/16/2023: 6/80 Goal status: ONGOING  6. Patient will increase 30 second sit to stand score to at least 10 reps in order to increase functional ability and endurance.   Baseline: 3   12/16/2023: 6  Goal status: REVISED   PLAN: PT FREQUENCY: 2x/week  PT DURATION: 8 weeks  PLANNED INTERVENTIONS: 97164- PT Re-evaluation, 97110-Therapeutic exercises, 97530- Therapeutic activity, 97112- Neuromuscular re-education, 97535- Self Care, 65784- Manual therapy, [phone]- Gait training, Patient/Family education, Balance training, Stair training, Dry Needling, Joint mobilization, Joint manipulation, Cryotherapy, and Moist heat  PLAN FOR NEXT SESSION: Review/revise HEP, hamstring flexibility, progress hip and LE strengthening, stairs/sitting training.    Rosana Hoes, PT, DPT, LAT, ATC 12/16/23  4:58 PM Phone: [phone] Fax: [phone]

## 2023-12-16 NOTE — Progress Notes (Signed)
 [facility] Attending  Case, documentation, and findings reviewed. I agree with the assessment, diagnosis, and plan of care as outlined in the AWV note.

## 2023-12-17 ENCOUNTER — Other Ambulatory Visit (HOSPITAL_COMMUNITY): Payer: Self-pay

## 2023-12-17 MED ORDER — PEN NEEDLES 32G X 4 MM MISC
1.0000 | Freq: Every day | 1 refills | Status: AC
  Filled 2023-12-17: qty 200, 30d supply, fill #0
  Filled 2024-05-05: qty 100, 90d supply, fill #0
  Filled 2024-09-15: qty 100, 34d supply, fill #1

## 2023-12-19 ENCOUNTER — Other Ambulatory Visit (HOSPITAL_COMMUNITY): Payer: Self-pay

## 2023-12-25 ENCOUNTER — Other Ambulatory Visit: Payer: Self-pay

## 2023-12-25 ENCOUNTER — Ambulatory Visit: Admitting: Physical Therapy

## 2023-12-25 ENCOUNTER — Encounter: Payer: Self-pay | Admitting: Physical Therapy

## 2023-12-25 DIAGNOSIS — M6281 Muscle weakness (generalized): Secondary | ICD-10-CM

## 2023-12-25 DIAGNOSIS — G8929 Other chronic pain: Secondary | ICD-10-CM

## 2023-12-25 DIAGNOSIS — M79605 Pain in left leg: Secondary | ICD-10-CM | POA: Diagnosis not present

## 2023-12-25 NOTE — Therapy (Addendum)
 OUTPATIENT PHYSICAL THERAPY TREATMENT  DISCHARGE   Patient Name: Wayne Wolfe MRN: [id number] DOB:[birth year], 58 y.o., male Today's Date: 12/25/2023   END OF SESSION:  PT End of Session - 12/25/23 1535     Visit Number 4    Number of Visits 17    Date for PT Re-Evaluation 02/10/24    Authorization Type [employer]    Authorization - Number of Visits 27    PT Start Time 1530    PT Stop Time 1615    PT Time Calculation (min) 45 min    Activity Tolerance Patient tolerated treatment well    Behavior During Therapy WFL for tasks assessed/performed              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   Hypertension 2009   Well controlled on HCTZ   Hypokalemia 01/12/2019   Knee effusion, right 07/19/2017   Lobar pneumonia (HCC) 01/12/2019   Obesity 10/19/2020   Obstructive sleep apnea 10/19/2020   Osteoarthritis 10/16/2023   Otitis media 03/13/2017   Polyuria 03/13/2017   Poorly controlled diabetes mellitus (HCC) 03/13/2017   Type 2 diabetes mellitus (HCC)    Well controlled on metformin    Urine abnormality 07/04/2023   Vaccine counseling 07/04/2023   Weight loss 03/13/2017   Past Surgical History:  Procedure Laterality Date   CARPAL TUNNEL RELEASE Right 1990s   none     Patient Active Problem List   Diagnosis Date Noted   Pain of left lower extremity 12/02/2023   Osteoarthritis 10/16/2023   Vaccine counseling 07/04/2023   Urine abnormality 07/04/2023   Chest pain 12/25/2022   Knee pain 06/20/2022   Environmental allergies 02/16/2022   Healthcare maintenance 12/04/2021   Obstructive  sleep apnea 10/19/2020   Morbid obesity (HCC) 10/19/2020   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   Multiple joint pain 02/11/2020   Syphilis 12/14/2019   Unprotected sexual intercourse 12/01/2019   Pulmonary nodule 12/26/2017   Routine screening for STI (sexually transmitted infection) 10/16/2017   Otitis media 03/13/2017   Polyuria 03/13/2017   Colon cancer screening 02/24/2016   Testicular pain 01/20/2016   Numbness and tingling of both feet 08/26/2013   Keloid of skin 06/25/2012   Type 2 diabetes mellitus with other specified complication (HCC) 03/12/2011   Hyperlipidemia associated with type 2 diabetes mellitus (HCC) 03/12/2011   Hypertension associated with diabetes (HCC) 04/20/2008   Human immunodeficiency virus (HIV) disease (HCC) 04/15/2008    PCP: Adria Hopkins, MD  REFERRING PROVIDER: Priscella Brooms, DO; Bevelyn Bryant, MD   REFERRING DIAG: Acute pain of right knee; Pain of left lower extremity   THERAPY DIAG:  Pain in left leg  Chronic pain of right knee  Muscle weakness (generalized)  Rationale for Evaluation and Treatment: Rehabilitation  ONSET DATE: Chronic   SUBJECTIVE:  SUBJECTIVE STATEMENT: Patient reports the left leg is still painful. Pain remains from left hip to knee primarily.   12/16/2023: Patient reports continued left leg pain for over a month that is different than his right leg. He had been taking a muscle relaxer and some tylenol  to  help. The left leg will bother him most when he is standing up a lot, but can also be painful if he is sitting or lying down. There are times when he doesn't have pain.   EVAL: Patient comes in with c/c of right knee pain. Biggest factors are pain and stiffness. Wakes up with a lot of knee stiffness that gets better as he moves but does not fully leave. No numbness and tingling in the hip or knee, some in the calf or foot due to diabetes. Patient reports he fatigues easily and has leg pain that requires him to sit down when  walking long distance and with community ambulation.  PERTINENT HISTORY: Type 2 diabetes   PAIN:  Are you having pain? Yes:  NPRS scale: 6/10 current, 9/10 worst  Pain location: Left leg Pain description: Dull, burning, nagging Aggravating factors: Standing Relieving factors: Heat, tylenol    PRECAUTIONS: None  RED FLAGS: None   WEIGHT BEARING RESTRICTIONS: No  FALLS:  Has patient fallen in last 6 months?  No falls but some trips because he feels like the knee is giving out  LIVING ENVIRONMENT: Lives with: lives with their family and lives alone Lives in: House/apartment Stairs: No Has following equipment at home: None  OCCUPATION: N/A   PLOF: Independent  PATIENT GOALS: Living life without pain and not hobbling around.    OBJECTIVE:  Note: Objective measures were completed at Evaluation unless otherwise noted. PATIENT SURVEYS:  LEFS 5/80 ( 6.3%)  12/16/2023: 6/80  MUSCLE LENGTH: Hamstrings: very tight, restricted past 90   12/16/2023: < 45 deg SLR  POSTURE: weight shift left  PALPATION: Tenderness to medial knee, hamstring, and gluteal region on right side.   LOWER EXTREMITY ROM:  Active ROM Right eval Left eval Rt 12/16/2023  Hip flexion     Hip extension     Hip abduction     Hip adduction     Hip internal rotation     Hip external rotation     Knee flexion 70 95 80  Knee extension 0 0   Ankle dorsiflexion     Ankle plantarflexion     Ankle inversion     Ankle eversion      (Blank rows = not tested)  LOWER EXTREMITY MMT:  MMT Right eval Left eval Rt / Lt 12/16/2023  Hip flexion 4- 4- 4- / 4-  Hip extension     Hip abduction 3 3 3  / 3-  Hip adduction     Hip internal rotation     Hip external rotation     Knee flexion 3 3 4  / 4  Knee extension 3 3 4  / 4  Ankle dorsiflexion 4- 4-   Ankle plantarflexion 4- 4-   Ankle inversion     Ankle eversion      (Blank rows = not tested)  LOWER EXTREMITY SPECIAL TESTS:  SLR:  negative  FUNCTIONAL TESTS:  30 seconds chair stand test: 3  12/16/2023: 6 reps  GAIT: Distance walked: 60 ft Assistive device utilized: None Level of assistance: Complete Independence Comments: Weight shift to left, limited hip flexion, limited  TREATMENT DATE:  [facility] Adult PT Treatment:                                                DATE: 12/16/2023 NuStep L6 x 5 min with UE/LE to build endurance and workload capacity Seated hamstring stretch 3 x 20 sec each Piriformis stretch 2 x 20 sec each SLR partial range 2 x 5 each Hooklying clamshell with blue 2 x 10 Bridge 2 x 10 LAQ with 5# 2 x 10 each Sit to stand x 10, holding 10# at chest 2 x 10   PATIENT EDUCATION:  Education details: HEP Person educated: Patient Education method: Programmer, multimedia, Demonstration, Actor cues, Verbal cues Education comprehension: verbalized understanding, returned demonstration, tactile cues required, and needs further education  HOME EXERCISE PROGRAM: Access Code: [email or web address]   ASSESSMENT: CLINICAL IMPRESSION: Patient tolerated therapy well with no adverse effects. Therapy focused on improve his flexibility and progressing LE strength with good tolerance. He was able to progress with added weight for his LE strengthening and did not report any increase in pain with therapy. He does exhibit difficulty with his SLR exercise and only able to complete partial range. No changes made to his HEP this visit. Patient would benefit from continued skilled PT to progress his mobility and strength in order to reduce pain and maximize functional ability.    EVAL: Patient is a 58 y.o. male who was seen today for physical therapy evaluation and treatment for right knee pain. Patient arrives with an antalgic gait and limited WB on the right LE. Patient has functional limitations due to the pain and  muscle endurance, scoring a 3 on 30-second sit to stands which is below the age norm. Patient lacks LE strength in both right and left leg with the R>L which makes gait challenging. Community ambulation limited due to challenges with sit to stands, gait deficits, and limited endurance. ROM restrictions in both knees impact gait disturbances. Overall, patient would benefit from skilled PT to increase LE strength, endurance, and stability while decreasing pain and increasing functional ability.   OBJECTIVE IMPAIRMENTS: Abnormal gait, decreased activity tolerance, decreased ROM, decreased strength, and pain.   ACTIVITY LIMITATIONS: carrying, lifting, bending, sitting, standing, squatting, sleeping, stairs, transfers, and locomotion level  PARTICIPATION LIMITATIONS: community activity  PERSONAL FACTORS: Fitness, Past/current experiences, and Time since onset of injury/illness/exacerbation are also affecting patient's functional outcome.    GOALS: Goals reviewed with patient? Yes  SHORT TERM GOALS: Target date: 01/13/2024  Patient will be I with initial HEP in order to progress with therapy. Baseline: 12/16/2023: progressing Goal status: ONGOING  2.  Patient will score </= 6/10 at worse on the NPS by 3rd visit in order to show an improvement in pain allowing for an increase in functional ability.   Baseline: 9/10 12/16/2023: 9/10 Goal status: ONGOING  LONG TERM GOALS: Target date: 02/10/2024  Patient will be independent with final HEP in order to continue treatment at home for pain tolerance.  Baseline:  12/16/2023: progressing Goal status: ONGOING  2.  Patient will score </= 3/10 at worse on the NPS to show an improvement in pain allowing for an increase in functional ability.   Baseline: 9/10 12/16/2023: 9/10 Goal status: ONGOING  3.  Patient will increase right knee flexion ROM by at least 20 degrees to increase functional ability.  Baseline: 70 deg 12/16/2023: 80  deg Goal status:  ONGOING  4.  Patient will increase strength to a 4/5 to show an increase in LE strength to increase functional ability.  Baseline: see above 12/16/2023: see limitations above Goal status: ONGOING  5.  Patient will score at least a 20/80 on LEFS based on MCID guidelines to show improvement in functional ability.  Baseline: 5/80 12/16/2023: 6/80 Goal status: ONGOING  6. Patient will increase 30 second sit to stand score to at least 10 reps in order to increase functional ability and endurance.   Baseline: 3   12/16/2023: 6  Goal status: REVISED   PLAN: PT FREQUENCY: 2x/week  PT DURATION: 8 weeks  PLANNED INTERVENTIONS: 97164- PT Re-evaluation, 97110-Therapeutic exercises, 97530- Therapeutic activity, 97112- Neuromuscular re-education, 97535- Self Care, 29528- Manual therapy, [phone]- Gait training, Patient/Family education, Balance training, Stair training, Dry Needling, Joint mobilization, Joint manipulation, Cryotherapy, and Moist heat  PLAN FOR NEXT SESSION: Review/revise HEP, hamstring flexibility, progress hip and LE strengthening, stairs/sitting training.    Leah Primus, PT, DPT, LAT, ATC 12/25/23  4:24 PM Phone: [phone] Fax: [phone]   PHYSICAL THERAPY DISCHARGE SUMMARY  Visits from Start of Care: 4  Current functional level related to goals / functional outcomes: See above   Remaining deficits: See above   Education / Equipment: HEP   Patient agrees to discharge. Patient goals were not met. Patient is being discharged due to not returning since the last visit.  Leah Primus, PT, DPT, LAT, ATC 01/31/24  10:37 AM Phone: [phone] Fax: [phone]

## 2023-12-30 ENCOUNTER — Other Ambulatory Visit: Payer: Self-pay

## 2023-12-30 DIAGNOSIS — Z113 Encounter for screening for infections with a predominantly sexual mode of transmission: Secondary | ICD-10-CM

## 2023-12-30 DIAGNOSIS — B20 Human immunodeficiency virus [HIV] disease: Secondary | ICD-10-CM

## 2023-12-30 NOTE — Therapy (Deleted)
 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   Hypertension 2009   Well controlled on HCTZ   Hypokalemia 01/12/2019   Knee effusion, right 07/19/2017   Lobar pneumonia (HCC) 01/12/2019   Obesity 10/19/2020   Obstructive sleep apnea 10/19/2020   Osteoarthritis 10/16/2023   Otitis media 03/13/2017   Polyuria 03/13/2017   Poorly controlled diabetes mellitus (HCC) 03/13/2017   Type 2 diabetes mellitus (HCC)    Well controlled on metformin   Urine abnormality 07/04/2023   Vaccine counseling 07/04/2023   Weight loss 03/13/2017   Past Surgical History:  Procedure Laterality Date   CARPAL TUNNEL RELEASE Right 1990s   none     Patient Active Problem List   Diagnosis Date Noted   Pain of left lower extremity 12/02/2023   Osteoarthritis 10/16/2023   Vaccine counseling 07/04/2023   Urine abnormality 07/04/2023   Chest pain 12/25/2022   Knee pain 06/20/2022   Environmental allergies 02/16/2022   Healthcare maintenance 12/04/2021   Obstructive sleep apnea 10/19/2020   Morbid obesity (HCC) 10/19/2020   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   Multiple joint pain 02/11/2020   Syphilis 12/14/2019   Unprotected sexual intercourse 12/01/2019   Pulmonary nodule 12/26/2017   Routine screening for STI (sexually transmitted infection) 10/16/2017   Otitis media 03/13/2017   Polyuria 03/13/2017   Colon cancer screening 02/24/2016   Testicular pain 01/20/2016    Numbness and tingling of both feet 08/26/2013   Keloid of skin 06/25/2012   Type 2 diabetes mellitus with other specified complication (HCC) 03/12/2011   Hyperlipidemia associated with type 2 diabetes mellitus (HCC) 03/12/2011   Hypertension associated with diabetes (HCC) 04/20/2008   Human immunodeficiency virus (HIV) disease (HCC) 04/15/2008    PCP: Marrianne Mood, MD  REFERRING PROVIDER: Gust Rung, DO; Dickie La, MD   REFERRING DIAG: Acute pain of right knee; Pain of left lower extremity   THERAPY DIAG:  No diagnosis found.  Rationale for Evaluation and Treatment: Rehabilitation  ONSET DATE: Chronic   SUBJECTIVE:  SUBJECTIVE STATEMENT: Patient reports the left leg is still painful. Pain remains from left hip to knee primarily.   12/16/2023: Patient reports continued left leg pain for over a month that is different than his right leg. He had been taking a muscle relaxer and some tylenol to help. The left leg will bother him most when he is standing up a lot, but can also be painful if he is sitting or lying down. There are times when he doesn't have pain.   EVAL: Patient comes in with c/c of right knee pain. Biggest factors are pain and stiffness. Wakes up with a lot of knee stiffness that gets better as he moves but does not fully leave. No numbness and tingling in the hip or knee, some in the calf or foot due to diabetes. Patient reports he fatigues easily and has leg pain that requires him to sit down when walking long distance and  with community ambulation.  PERTINENT HISTORY: Type 2 diabetes   PAIN:  Are you having pain? Yes:  NPRS scale: 6/10 current, 9/10 worst  Pain location: Left leg Pain description: Dull, burning, nagging Aggravating factors: Standing Relieving factors: Heat, tylenol   PRECAUTIONS: None  RED FLAGS: None   WEIGHT BEARING RESTRICTIONS: No  FALLS:  Has patient fallen in last 6 months?  No falls but some trips because he feels like  the knee is giving out  LIVING ENVIRONMENT: Lives with: lives with their family and lives alone Lives in: House/apartment Stairs: No Has following equipment at home: None  OCCUPATION: N/A   PLOF: Independent  PATIENT GOALS: Living life without pain and not hobbling around.    OBJECTIVE:  Note: Objective measures were completed at Evaluation unless otherwise noted. PATIENT SURVEYS:  LEFS 5/80 ( 6.3%)  12/16/2023: 6/80  MUSCLE LENGTH: Hamstrings: very tight, restricted past 90   12/16/2023: < 45 deg SLR  POSTURE: weight shift left  PALPATION: Tenderness to medial knee, hamstring, and gluteal region on right side.   LOWER EXTREMITY ROM:  Active ROM Right eval Left eval Rt 12/16/2023  Hip flexion     Hip extension     Hip abduction     Hip adduction     Hip internal rotation     Hip external rotation     Knee flexion 70 95 80  Knee extension 0 0   Ankle dorsiflexion     Ankle plantarflexion     Ankle inversion     Ankle eversion      (Blank rows = not tested)  LOWER EXTREMITY MMT:  MMT Right eval Left eval Rt / Lt 12/16/2023  Hip flexion 4- 4- 4- / 4-  Hip extension     Hip abduction 3 3 3  / 3-  Hip adduction     Hip internal rotation     Hip external rotation     Knee flexion 3 3 4  / 4  Knee extension 3 3 4  / 4  Ankle dorsiflexion 4- 4-   Ankle plantarflexion 4- 4-   Ankle inversion     Ankle eversion      (Blank rows = not tested)  LOWER EXTREMITY SPECIAL TESTS:  SLR: negative  FUNCTIONAL TESTS:  30 seconds chair stand test: 3  12/16/2023: 6 reps  GAIT: Distance walked: 60 ft Assistive device utilized: None Level of assistance: Complete Independence Comments: Weight shift to left, limited hip flexion, limited                                                                                                                                TREATMENT DATE:  [facility] Adult PT Treatment:  DATE:  12/16/2023 NuStep L6 x 5 min with UE/LE to build endurance and workload capacity Seated hamstring stretch 3 x 20 sec each Piriformis stretch 2 x 20 sec each SLR partial range 2 x 5 each Hooklying clamshell with blue 2 x 10 Bridge 2 x 10 LAQ with 5# 2 x 10 each Sit to stand x 10, holding 10# at chest 2 x 10   PATIENT EDUCATION:  Education details: HEP Person educated: Patient Education method: Programmer, multimedia, Demonstration, Actor cues, Verbal cues Education comprehension: verbalized understanding, returned demonstration, tactile cues required, and needs further education  HOME EXERCISE PROGRAM: Access Code: [email or web address]   ASSESSMENT: CLINICAL IMPRESSION: Patient tolerated therapy well with no adverse effects. Therapy focused on improve his flexibility and progressing LE strength with good tolerance. He was able to progress with added weight for his LE strengthening and did not report any increase in pain with therapy. He does exhibit difficulty with his SLR exercise and only able to complete partial range. No changes made to his HEP this visit. Patient would benefit from continued skilled PT to progress his mobility and strength in order to reduce pain and maximize functional ability.    EVAL: Patient is a 58 y.o. male who was seen today for physical therapy evaluation and treatment for right knee pain. Patient arrives with an antalgic gait and limited WB on the right LE. Patient has functional limitations due to the pain and muscle endurance, scoring a 3 on 30-second sit to stands which is below the age norm. Patient lacks LE strength in both right and left leg with the R>L which makes gait challenging. Community ambulation limited due to challenges with sit to stands, gait deficits, and limited endurance. ROM restrictions in both knees impact gait disturbances. Overall, patient would benefit from skilled PT to increase LE strength, endurance, and stability while decreasing pain and increasing  functional ability.   OBJECTIVE IMPAIRMENTS: Abnormal gait, decreased activity tolerance, decreased ROM, decreased strength, and pain.   ACTIVITY LIMITATIONS: carrying, lifting, bending, sitting, standing, squatting, sleeping, stairs, transfers, and locomotion level  PARTICIPATION LIMITATIONS: community activity  PERSONAL FACTORS: Fitness, Past/current experiences, and Time since onset of injury/illness/exacerbation are also affecting patient's functional outcome.    GOALS: Goals reviewed with patient? Yes  SHORT TERM GOALS: Target date: 01/13/2024  Patient will be I with initial HEP in order to progress with therapy. Baseline: 12/16/2023: progressing Goal status: ONGOING  2.  Patient will score </= 6/10 at worse on the NPS by 3rd visit in order to show an improvement in pain allowing for an increase in functional ability.   Baseline: 9/10 12/16/2023: 9/10 Goal status: ONGOING  LONG TERM GOALS: Target date: 02/10/2024  Patient will be independent with final HEP in order to continue treatment at home for pain tolerance.  Baseline:  12/16/2023: progressing Goal status: ONGOING  2.  Patient will score </= 3/10 at worse on the NPS to show an improvement in pain allowing for an increase in functional ability.   Baseline: 9/10 12/16/2023: 9/10 Goal status: ONGOING  3.  Patient will increase right knee flexion ROM by at least 20 degrees to increase functional ability.  Baseline: 70 deg 12/16/2023: 80 deg Goal status: ONGOING  4.  Patient will increase strength to a 4/5 to show an increase in LE strength to increase functional ability.  Baseline: see above 12/16/2023: see limitations above Goal status: ONGOING  5.  Patient will score at least a 20/80 on LEFS based on MCID guidelines to  show improvement in functional ability.  Baseline: 5/80 12/16/2023: 6/80 Goal status: ONGOING  6. Patient will increase 30 second sit to stand score to at least 10 reps in order to increase  functional ability and endurance.   Baseline: 3   12/16/2023: 6  Goal status: REVISED   PLAN: PT FREQUENCY: 2x/week  PT DURATION: 8 weeks  PLANNED INTERVENTIONS: 97164- PT Re-evaluation, 97110-Therapeutic exercises, 97530- Therapeutic activity, 97112- Neuromuscular re-education, 97535- Self Care, 09811- Manual therapy, [phone]- Gait training, Patient/Family education, Balance training, Stair training, Dry Needling, Joint mobilization, Joint manipulation, Cryotherapy, and Moist heat  PLAN FOR NEXT SESSION: Review/revise HEP, hamstring flexibility, progress hip and LE strengthening, stairs/sitting training.    Rosana Hoes, PT, DPT, LAT, ATC 12/30/23  9:42 AM Phone: [phone] Fax: [phone]

## 2024-01-01 ENCOUNTER — Ambulatory Visit

## 2024-01-06 ENCOUNTER — Other Ambulatory Visit

## 2024-01-06 ENCOUNTER — Other Ambulatory Visit (HOSPITAL_COMMUNITY)
Admission: RE | Admit: 2024-01-06 | Discharge: 2024-01-06 | Disposition: A | Discharge status: Home / Self Care | Source: Ambulatory Visit | Attending: Infectious Disease | Admitting: Infectious Disease

## 2024-01-06 ENCOUNTER — Other Ambulatory Visit: Payer: Self-pay

## 2024-01-06 ENCOUNTER — Other Ambulatory Visit: Payer: Medicaid Other

## 2024-01-06 DIAGNOSIS — B20 Human immunodeficiency virus [HIV] disease: Secondary | ICD-10-CM

## 2024-01-06 DIAGNOSIS — Z113 Encounter for screening for infections with a predominantly sexual mode of transmission: Secondary | ICD-10-CM | POA: Diagnosis present

## 2024-01-07 LAB — URINE CYTOLOGY ANCILLARY ONLY
Chlamydia: NEGATIVE
Comment: NEGATIVE
Comment: NORMAL
Neisseria Gonorrhea: NEGATIVE

## 2024-01-07 LAB — T-HELPER CELL (CD4) - (RCID CLINIC ONLY)
CD4 % Helper T Cell: 27 % — ABNORMAL LOW (ref 33–65)
CD4 T Cell Abs: 542 /uL (ref 400–1790)

## 2024-01-08 LAB — CBC WITH DIFFERENTIAL/PLATELET
Absolute Lymphocytes: 2449 {cells}/uL (ref 850–3900)
Absolute Monocytes: 395 {cells}/uL (ref 200–950)
Basophils Absolute: 59 {cells}/uL (ref 0–200)
Basophils Relative: 1.4 %
Eosinophils Absolute: 151 {cells}/uL (ref 15–500)
Eosinophils Relative: 3.6 %
HCT: 50.9 % — ABNORMAL HIGH (ref 38.5–50.0)
Hemoglobin: 16.8 g/dL (ref 13.2–17.1)
MCH: 29.3 pg (ref 27.0–33.0)
MCHC: 33 g/dL (ref 32.0–36.0)
MCV: 88.7 fL (ref 80.0–100.0)
MPV: 9.7 fL (ref 7.5–12.5)
Monocytes Relative: 9.4 %
Neutro Abs: 1147 {cells}/uL — ABNORMAL LOW (ref 1500–7800)
Neutrophils Relative %: 27.3 %
Platelets: 292 10*3/uL (ref 140–400)
RBC: 5.74 10*6/uL (ref 4.20–5.80)
RDW: 13.2 % (ref 11.0–15.0)
Total Lymphocyte: 58.3 %
WBC: 4.2 10*3/uL (ref 3.8–10.8)

## 2024-01-08 LAB — RPR: RPR Ser Ql: REACTIVE — AB

## 2024-01-08 LAB — COMPLETE METABOLIC PANEL WITHOUT GFR
AG Ratio: 1.5 (calc) (ref 1.0–2.5)
ALT: 12 U/L (ref 9–46)
AST: 14 U/L (ref 10–35)
Albumin: 4.3 g/dL (ref 3.6–5.1)
Alkaline phosphatase (APISO): 60 U/L (ref 35–144)
BUN: 13 mg/dL (ref 7–25)
CO2: 24 mmol/L (ref 20–32)
Calcium: 9.4 mg/dL (ref 8.6–10.3)
Chloride: 105 mmol/L (ref 98–110)
Creat: 1.08 mg/dL (ref 0.70–1.30)
Globulin: 2.9 g/dL (ref 1.9–3.7)
Glucose, Bld: 133 mg/dL — ABNORMAL HIGH (ref 65–99)
Potassium: 4.1 mmol/L (ref 3.5–5.3)
Sodium: 142 mmol/L (ref 135–146)
Total Bilirubin: 0.4 mg/dL (ref 0.2–1.2)
Total Protein: 7.2 g/dL (ref 6.1–8.1)

## 2024-01-08 LAB — T PALLIDUM AB: T Pallidum Abs: POSITIVE — AB

## 2024-01-08 LAB — HIV-1 RNA QUANT-NO REFLEX-BLD
HIV 1 RNA Quant: NOT DETECTED {copies}/mL
HIV-1 RNA Quant, Log: NOT DETECTED {Log_copies}/mL

## 2024-01-08 LAB — RPR TITER: RPR Titer: 1:1 {titer} — ABNORMAL HIGH

## 2024-01-10 ENCOUNTER — Other Ambulatory Visit: Payer: Self-pay | Admitting: Student

## 2024-01-10 DIAGNOSIS — M79605 Pain in left leg: Secondary | ICD-10-CM

## 2024-01-12 NOTE — Therapy (Deleted)
 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   Hypertension 2009   Well controlled on HCTZ   Hypokalemia 01/12/2019   Knee effusion, right 07/19/2017   Lobar pneumonia (HCC) 01/12/2019   Obesity 10/19/2020   Obstructive sleep apnea 10/19/2020   Osteoarthritis 10/16/2023   Otitis media 03/13/2017   Polyuria 03/13/2017   Poorly controlled diabetes mellitus (HCC) 03/13/2017   Type 2 diabetes mellitus (HCC)    Well controlled on metformin   Urine abnormality 07/04/2023   Vaccine counseling 07/04/2023   Weight loss 03/13/2017   Past Surgical History:  Procedure Laterality Date   CARPAL TUNNEL RELEASE Right 1990s   none     Patient Active Problem List   Diagnosis Date Noted   Pain of left lower extremity 12/02/2023   Osteoarthritis 10/16/2023   Vaccine counseling 07/04/2023   Urine abnormality 07/04/2023   Chest pain 12/25/2022   Knee pain 06/20/2022   Environmental allergies 02/16/2022   Healthcare maintenance 12/04/2021   Obstructive sleep apnea 10/19/2020   Morbid obesity (HCC) 10/19/2020   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   Multiple joint pain 02/11/2020   Syphilis 12/14/2019   Unprotected sexual intercourse 12/01/2019   Pulmonary nodule 12/26/2017   Routine screening for STI (sexually transmitted infection) 10/16/2017   Otitis media 03/13/2017   Polyuria 03/13/2017   Colon cancer screening 02/24/2016   Testicular pain 01/20/2016    Numbness and tingling of both feet 08/26/2013   Keloid of skin 06/25/2012   Type 2 diabetes mellitus with other specified complication (HCC) 03/12/2011   Hyperlipidemia associated with type 2 diabetes mellitus (HCC) 03/12/2011   Hypertension associated with diabetes (HCC) 04/20/2008   Human immunodeficiency virus (HIV) disease (HCC) 04/15/2008    PCP: Adria Hopkins, MD  REFERRING PROVIDER: Priscella Brooms, DO; Bevelyn Bryant, MD   REFERRING DIAG: Acute pain of right knee; Pain of left lower extremity   THERAPY DIAG:  No diagnosis found.  Rationale for Evaluation and Treatment: Rehabilitation  ONSET DATE: Chronic   SUBJECTIVE:  SUBJECTIVE STATEMENT: Patient reports the left leg is still painful. Pain remains from left hip to knee primarily.   12/16/2023: Patient reports continued left leg pain for over a month that is different than his right leg. He had been taking a muscle relaxer and some tylenol to help. The left leg will bother him most when he is standing up a lot, but can also be painful if he is sitting or lying down. There are times when he doesn't have pain.   EVAL: Patient comes in with c/c of right knee pain. Biggest factors are pain and stiffness. Wakes up with a lot of knee stiffness that gets better as he moves but does not fully leave. No numbness and tingling in the hip or knee, some in the calf or foot due to diabetes. Patient reports he fatigues easily and has leg pain that requires him to sit down when walking long distance and  with community ambulation.  PERTINENT HISTORY: Type 2 diabetes   PAIN:  Are you having pain? Yes:  NPRS scale: 6/10 current, 9/10 worst  Pain location: Left leg Pain description: Dull, burning, nagging Aggravating factors: Standing Relieving factors: Heat, tylenol   PRECAUTIONS: None  RED FLAGS: None   WEIGHT BEARING RESTRICTIONS: No  FALLS:  Has patient fallen in last 6 months?  No falls but some trips because he feels like  the knee is giving out  LIVING ENVIRONMENT: Lives with: lives with their family and lives alone Lives in: House/apartment Stairs: No Has following equipment at home: None  OCCUPATION: N/A   PLOF: Independent  PATIENT GOALS: Living life without pain and not hobbling around.    OBJECTIVE:  Note: Objective measures were completed at Evaluation unless otherwise noted. PATIENT SURVEYS:  LEFS 5/80 ( 6.3%)  12/16/2023: 6/80  MUSCLE LENGTH: Hamstrings: very tight, restricted past 90   12/16/2023: < 45 deg SLR  POSTURE: weight shift left  PALPATION: Tenderness to medial knee, hamstring, and gluteal region on right side.   LOWER EXTREMITY ROM:  Active ROM Right eval Left eval Rt 12/16/2023  Hip flexion     Hip extension     Hip abduction     Hip adduction     Hip internal rotation     Hip external rotation     Knee flexion 70 95 80  Knee extension 0 0   Ankle dorsiflexion     Ankle plantarflexion     Ankle inversion     Ankle eversion      (Blank rows = not tested)  LOWER EXTREMITY MMT:  MMT Right eval Left eval Rt / Lt 12/16/2023  Hip flexion 4- 4- 4- / 4-  Hip extension     Hip abduction 3 3 3  / 3-  Hip adduction     Hip internal rotation     Hip external rotation     Knee flexion 3 3 4  / 4  Knee extension 3 3 4  / 4  Ankle dorsiflexion 4- 4-   Ankle plantarflexion 4- 4-   Ankle inversion     Ankle eversion      (Blank rows = not tested)  LOWER EXTREMITY SPECIAL TESTS:  SLR: negative  FUNCTIONAL TESTS:  30 seconds chair stand test: 3  12/16/2023: 6 reps  GAIT: Distance walked: 60 ft Assistive device utilized: None Level of assistance: Complete Independence Comments: Weight shift to left, limited hip flexion, limited                                                                                                                                TREATMENT DATE:  [facility] Adult PT Treatment:  DATE:  12/16/2023 NuStep L6 x 5 min with UE/LE to build endurance and workload capacity Seated hamstring stretch 3 x 20 sec each Piriformis stretch 2 x 20 sec each SLR partial range 2 x 5 each Hooklying clamshell with blue 2 x 10 Bridge 2 x 10 LAQ with 5# 2 x 10 each Sit to stand x 10, holding 10# at chest 2 x 10   PATIENT EDUCATION:  Education details: HEP Person educated: Patient Education method: Programmer, multimedia, Demonstration, Actor cues, Verbal cues Education comprehension: verbalized understanding, returned demonstration, tactile cues required, and needs further education  HOME EXERCISE PROGRAM: Access Code: [email or web address]   ASSESSMENT: CLINICAL IMPRESSION: Patient tolerated therapy well with no adverse effects. Therapy focused on improve his flexibility and progressing LE strength with good tolerance. He was able to progress with added weight for his LE strengthening and did not report any increase in pain with therapy. He does exhibit difficulty with his SLR exercise and only able to complete partial range. No changes made to his HEP this visit. Patient would benefit from continued skilled PT to progress his mobility and strength in order to reduce pain and maximize functional ability.    EVAL: Patient is a 58 y.o. male who was seen today for physical therapy evaluation and treatment for right knee pain. Patient arrives with an antalgic gait and limited WB on the right LE. Patient has functional limitations due to the pain and muscle endurance, scoring a 3 on 30-second sit to stands which is below the age norm. Patient lacks LE strength in both right and left leg with the R>L which makes gait challenging. Community ambulation limited due to challenges with sit to stands, gait deficits, and limited endurance. ROM restrictions in both knees impact gait disturbances. Overall, patient would benefit from skilled PT to increase LE strength, endurance, and stability while decreasing pain and increasing  functional ability.   OBJECTIVE IMPAIRMENTS: Abnormal gait, decreased activity tolerance, decreased ROM, decreased strength, and pain.   ACTIVITY LIMITATIONS: carrying, lifting, bending, sitting, standing, squatting, sleeping, stairs, transfers, and locomotion level  PARTICIPATION LIMITATIONS: community activity  PERSONAL FACTORS: Fitness, Past/current experiences, and Time since onset of injury/illness/exacerbation are also affecting patient's functional outcome.    GOALS: Goals reviewed with patient? Yes  SHORT TERM GOALS: Target date: 01/13/2024  Patient will be I with initial HEP in order to progress with therapy. Baseline: 12/16/2023: progressing Goal status: ONGOING  2.  Patient will score </= 6/10 at worse on the NPS by 3rd visit in order to show an improvement in pain allowing for an increase in functional ability.   Baseline: 9/10 12/16/2023: 9/10 Goal status: ONGOING  LONG TERM GOALS: Target date: 02/10/2024  Patient will be independent with final HEP in order to continue treatment at home for pain tolerance.  Baseline:  12/16/2023: progressing Goal status: ONGOING  2.  Patient will score </= 3/10 at worse on the NPS to show an improvement in pain allowing for an increase in functional ability.   Baseline: 9/10 12/16/2023: 9/10 Goal status: ONGOING  3.  Patient will increase right knee flexion ROM by at least 20 degrees to increase functional ability.  Baseline: 70 deg 12/16/2023: 80 deg Goal status: ONGOING  4.  Patient will increase strength to a 4/5 to show an increase in LE strength to increase functional ability.  Baseline: see above 12/16/2023: see limitations above Goal status: ONGOING  5.  Patient will score at least a 20/80 on LEFS based on MCID guidelines to  show improvement in functional ability.  Baseline: 5/80 12/16/2023: 6/80 Goal status: ONGOING  6. Patient will increase 30 second sit to stand score to at least 10 reps in order to increase  functional ability and endurance.   Baseline: 3   12/16/2023: 6  Goal status: REVISED   PLAN: PT FREQUENCY: 2x/week  PT DURATION: 8 weeks  PLANNED INTERVENTIONS: 97164- PT Re-evaluation, 97110-Therapeutic exercises, 97530- Therapeutic activity, 97112- Neuromuscular re-education, 97535- Self Care, 16109- Manual therapy, [phone]- Gait training, Patient/Family education, Balance training, Stair training, Dry Needling, Joint mobilization, Joint manipulation, Cryotherapy, and Moist heat  PLAN FOR NEXT SESSION: Review/revise HEP, hamstring flexibility, progress hip and LE strengthening, stairs/sitting training.    Leah Primus, PT, DPT, LAT, ATC 01/12/24  1:40 PM Phone: [phone] Fax: [phone]

## 2024-01-13 ENCOUNTER — Ambulatory Visit: Attending: Internal Medicine

## 2024-01-15 ENCOUNTER — Telehealth: Payer: Self-pay

## 2024-01-15 NOTE — Telephone Encounter (Signed)
 Patient called to follow up on referral that was placed by Dr. Ernie Heal. Referral was placed to Dr. Andy Bannister 10/2023 for abnormal anal pap.   Patient was seen by [facility] in March 2025 for anal dysplasia concerns. Discussed that he does not need to call Dr. Linn Rich office since anal dysplasia is being managed by [facility].   Jahmya Onofrio, BSN, RN

## 2024-01-19 ENCOUNTER — Encounter: Payer: Self-pay | Admitting: Infectious Disease

## 2024-01-19 DIAGNOSIS — B20 Human immunodeficiency virus [HIV] disease: Secondary | ICD-10-CM | POA: Insufficient documentation

## 2024-01-19 HISTORY — DX: Human immunodeficiency virus (HIV) disease: B20

## 2024-01-19 NOTE — Progress Notes (Unsigned)
 Subjective:  Chief complaint: follow-up for HIV disease on medications   Patient ID: Wayne Wolfe, male    DOB: [birth year], 58 y.o.   MRN: [id number]  HPI Discussed the use of AI scribe software for clinical note transcription with the patient, who gave verbal consent to proceed.  History of Present Illness   The patient, with a history of HIV and diabetes, presents for a follow-up visit. Ht. The patient had a high-resolution anoscopy a year ago at [facility] due abnormal pap smear. He recently had another visit to [facility] in March, where an anal Pap smear was performed. 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   The patient's HIV is well-managed, with a non-detectable viral load and a CD4 count of 542. He has been on Biktarvy  for a while and it seems to be working well. He also has a syphilis titer of 1:1, which the doctor reassures is not a concern.  The patient's diabetes is being managed with Ozempic  and Lantus  insulin . He has lost about 20 pounds since starting Ozempic . His recent labs showed a slightly high sugar level, which is expected given his diabetes. He is also on Crestor  for cholesterol management, and metoprolol  and amlodipine for blood pressure control.       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   Hypertension 2009   Well controlled on HCTZ   Hypokalemia 01/12/2019   Knee effusion, right 07/19/2017   Lobar pneumonia (HCC) 01/12/2019   Obesity 10/19/2020   Obstructive sleep apnea 10/19/2020   Osteoarthritis 10/16/2023   Otitis media 03/13/2017   Polyuria  03/13/2017   Poorly controlled diabetes mellitus (HCC) 03/13/2017   Type 2 diabetes mellitus (HCC)    Well controlled on metformin    Urine abnormality 07/04/2023   Vaccine counseling 07/04/2023   Weight loss 03/13/2017    Past Surgical History:  Procedure Laterality Date   CARPAL TUNNEL RELEASE Right 1990s   none      Family History  Problem Relation Age of Onset   Diabetes Mellitus II Sister    Colon polyps Sister    Diabetes Mellitus II Mother    Coronary artery disease Mother 37       CABGx6   Coronary artery disease Sister 12       CABG   Diabetes Mellitus II Sister    Coronary artery disease Sister    Diabetes Mellitus II Sister    Colon cancer Neg Hx    Esophageal cancer Neg Hx    Rectal cancer Neg Hx    Stomach cancer Neg Hx       Social History   Socioeconomic History   Marital status: Single    Spouse name: Not on file   Number of children: Not on file   Years of education: 13   Highest education level: Not on file  Occupational History    Employer: UNEMPLOYED  Tobacco Use   Smoking status: Former    Current packs/day: 0.00    Types: Cigarettes    Quit date: 2003    Years since quitting: 22.3  Smokeless tobacco: Never  Vaping Use   Vaping status: Never Used  Substance and Sexual Activity   Alcohol use: No    Alcohol/week: 0.0 standard drinks of alcohol    Comment: Last used 2004   Drug use: No    Comment: Last used 2003   Sexual activity: Yes    Partners: Male    Comment: declined condoms  Other Topics Concern   Not on file  Social History Narrative   On disability. Lives in [place].    Social Drivers of Health   Financial Resource Strain: Medium Risk (11/20/2023)   Overall Financial Resource Strain (CARDIA)    Difficulty of Paying Living Expenses: Somewhat hard  Food Insecurity: Food Insecurity Present (11/20/2023)   Hunger Vital Sign    Worried About Running Out of Food in the Last Year: Sometimes true    Ran Out of Food in the  Last Year: Sometimes true  Transportation Needs: No Transportation Needs (11/20/2023)   PRAPARE - Administrator, Civil Service (Medical): No    Lack of Transportation (Non-Medical): No  Physical Activity: Inactive (11/20/2023)   Exercise Vital Sign    Days of Exercise per Week: 0 days    Minutes of Exercise per Session: 0 min  Stress: Stress Concern Present (11/20/2023)   [employer] of Occupational Health - Occupational Stress Questionnaire    Feeling of Stress : To some extent  Social Connections: Moderately Integrated (11/20/2023)   Social Connection and Isolation Panel [NHANES]    Frequency of Communication with Friends and Family: More than three times a week    Frequency of Social Gatherings with Friends and Family: Three times a week    Attends Religious Services: More than 4 times per year    Active Member of Clubs or Organizations: Yes    Attends Banker Meetings: 1 to 4 times per year    Marital Status: Never married    No Known Allergies   Current Outpatient Medications:    Accu-Chek FastClix Lancets MISC, Check blood sugar before meals and after meals, Disp: 102 each, Rfl: 12   acetaminophen  (TYLENOL ) 500 MG tablet, Take 1,000 mg by mouth every 6 (six) hours as needed for fever., Disp: , Rfl:    BIKTARVY  50-200-25 MG TABS tablet, TAKE 1 TABLET BY MOUTH DAILY, Disp: 30 tablet, Rfl: 1   Blood Glucose Monitoring Suppl (ACCU-CHEK GUIDE) w/Device KIT, 1 each by Does not apply route 6 (six) times daily., Disp: 1 kit, Rfl: 0   Continuous Glucose Sensor (DEXCOM G7 SENSOR) MISC, Change sensor every 10 days or as directed by your provider (for diabetes)., Disp: 3 each, Rfl: 3   diclofenac  Sodium (VOLTAREN ) 1 % GEL, Apply 4 grams topically 4 (four) times daily as needed, Disp: 100 g, Rfl: 3   empagliflozin  (JARDIANCE ) 10 MG TABS tablet, Take 1 tablet (10 mg total) by mouth daily before breakfast., Disp: 90 tablet, Rfl: 0   fexofenadine  (ALLEGRA ) 180 MG  tablet, TAKE 1 TABLET(180 MG) BY MOUTH DAILY, Disp: 30 tablet, Rfl: 5   gabapentin  (NEURONTIN ) 300 MG capsule, Take 1 capsule (300 mg total) by mouth 3 (three) times daily., Disp: 90 capsule, Rfl: 2   glucose blood (ACCU-CHEK GUIDE) test strip, Check blood sugar before meals and after meals, Disp: 100 each, Rfl: 12   insulin  glargine (LANTUS ) 100 UNIT/ML Solostar Pen, Inject 30 Units into the skin daily., Disp: 15 mL, Rfl: 3   Insulin  Pen Needle (PEN NEEDLES) 32G X  4 MM MISC, Use daily as directed., Disp: 200 each, Rfl: 1   lisinopril  (ZESTRIL ) 20 MG tablet, Take 1 tablet (20 mg total) by mouth daily., Disp: 90 tablet, Rfl: 3   metFORMIN  (GLUCOPHAGE -XR) 500 MG 24 hr tablet, Take 2 tablets (1,000 mg total) by mouth 2 (two) times daily with a meal., Disp: 360 tablet, Rfl: 2   methocarbamol  (ROBAXIN ) 750 MG tablet, TAKE 2 TABLETS(1500 MG) BY MOUTH FOUR TIMES DAILY FOR 3 DAYS THEN TAKE 2 TABLETS(1500 MG) BY MOUTH THREE TIMES DAILY FOR 4 DAYS, Disp: 48 tablet, Rfl: 0   metoprolol  tartrate (LOPRESSOR ) 100 MG tablet, Take 1 tablet by mouth two hours prior to scan, Disp: 1 tablet, Rfl: 0   rosuvastatin  (CRESTOR ) 20 MG tablet, TAKE 1 TABLET(20 MG) BY MOUTH DAILY, Disp: 90 tablet, Rfl: 3   Semaglutide , 2 MG/DOSE, (OZEMPIC , 2 MG/DOSE,) 8 MG/3ML SOPN, Inject 2 mg into the skin once a week., Disp: 3 mL, Rfl: 10    Review of Systems  Constitutional:  Negative for activity change, appetite change, chills, diaphoresis, fatigue, fever and unexpected weight change.  HENT:  Negative for congestion, rhinorrhea, sinus pressure, sneezing, sore throat and trouble swallowing.   Eyes:  Negative for photophobia and visual disturbance.  Respiratory:  Negative for cough, chest tightness, shortness of breath, wheezing and stridor.   Cardiovascular:  Negative for chest pain, palpitations and leg swelling.  Gastrointestinal:  Negative for abdominal distention, abdominal pain, anal bleeding, blood in stool, constipation,  diarrhea, nausea and vomiting.  Genitourinary:  Negative for difficulty urinating, dysuria, flank pain and hematuria.  Musculoskeletal:  Negative for arthralgias, back pain, gait problem, joint swelling and myalgias.  Skin:  Negative for color change, pallor, rash and wound.  Neurological:  Negative for dizziness, tremors, weakness and light-headedness.  Hematological:  Negative for adenopathy. Does not bruise/bleed easily.  Psychiatric/Behavioral:  Negative for agitation, behavioral problems, confusion, decreased concentration, dysphoric mood and sleep disturbance.        Objective:   Physical Exam Constitutional:      Appearance: He is well-developed.  HENT:     Head: Normocephalic and atraumatic.  Eyes:     Conjunctiva/sclera: Conjunctivae normal.  Cardiovascular:     Rate and Rhythm: Normal rate and regular rhythm.  Pulmonary:     Effort: Pulmonary effort is normal. No respiratory distress.     Breath sounds: No wheezing.  Abdominal:     General: There is no distension.     Palpations: Abdomen is soft.  Musculoskeletal:        General: No tenderness. Normal range of motion.     Cervical back: Normal range of motion and neck supple.  Skin:    General: Skin is warm and dry.     Coloration: Skin is not pale.     Findings: No erythema or rash.  Neurological:     General: No focal deficit present.     Mental Status: He is alert and oriented to person, place, and time.  Psychiatric:        Mood and Affect: Mood normal.        Behavior: Behavior normal.        Thought Content: Thought content normal.        Judgment: Judgment normal.           Assessment & Plan:   Assessment and Plan    HIV infection HIV well-controlled with undetectable viral load and CD4 count of 542. Biktarvy  effective. - Continue Biktarvy . -  Schedule follow-up in six months with labs prior to visit.   Low-grade squamous intraepithelial lesion (LSIL) High-resolution anoscopy revealed LSIL.  Requires regular follow-up. [facility] recommended return to annual anal pap smear and if HSIL refer back for HRA.   Diabetes mellitus Diabetes managed with Ozempic  and Lantus  insulin . 20-pound weight loss indicates effective management. - Continue Ozempic  and Lantus  insulin .  Hyperlipidemia Managed with Crestor . - Continue Crestor .  Hypertension Managed with metoprolol  and amlodipine. - Continue metoprolol  and amlodipine.  Vaccine counseling; recommended and gave shingles vaccine no 1 today.

## 2024-01-20 ENCOUNTER — Ambulatory Visit (INDEPENDENT_AMBULATORY_CARE_PROVIDER_SITE_OTHER): Payer: Self-pay | Admitting: Infectious Disease

## 2024-01-20 ENCOUNTER — Other Ambulatory Visit: Payer: Self-pay

## 2024-01-20 ENCOUNTER — Encounter: Payer: Self-pay | Admitting: Infectious Disease

## 2024-01-20 VITALS — BP 134/76 | HR 87 | Resp 16 | Ht 66.0 in | Wt 229.0 lb

## 2024-01-20 DIAGNOSIS — E785 Hyperlipidemia, unspecified: Secondary | ICD-10-CM

## 2024-01-20 DIAGNOSIS — Z23 Encounter for immunization: Secondary | ICD-10-CM

## 2024-01-20 DIAGNOSIS — Z7985 Long-term (current) use of injectable non-insulin antidiabetic drugs: Secondary | ICD-10-CM

## 2024-01-20 DIAGNOSIS — I10 Essential (primary) hypertension: Secondary | ICD-10-CM

## 2024-01-20 DIAGNOSIS — E119 Type 2 diabetes mellitus without complications: Secondary | ICD-10-CM | POA: Diagnosis not present

## 2024-01-20 DIAGNOSIS — K6282 Dysplasia of anus: Secondary | ICD-10-CM

## 2024-01-20 DIAGNOSIS — Z794 Long term (current) use of insulin: Secondary | ICD-10-CM | POA: Diagnosis not present

## 2024-01-20 DIAGNOSIS — I152 Hypertension secondary to endocrine disorders: Secondary | ICD-10-CM

## 2024-01-20 DIAGNOSIS — Z7185 Encounter for immunization safety counseling: Secondary | ICD-10-CM

## 2024-01-20 DIAGNOSIS — B20 Human immunodeficiency virus [HIV] disease: Secondary | ICD-10-CM | POA: Diagnosis present

## 2024-01-20 DIAGNOSIS — E1169 Type 2 diabetes mellitus with other specified complication: Secondary | ICD-10-CM

## 2024-01-20 MED ORDER — BIKTARVY 50-200-25 MG PO TABS: 1.0000 | ORAL_TABLET | Freq: Every day | ORAL | 11 refills | Status: DC

## 2024-01-24 ENCOUNTER — Encounter: Admitting: Family Medicine

## 2024-01-28 NOTE — Progress Notes (Signed)
 The ASCVD Risk score (Arnett DK, et al., 2019) failed to calculate for the following reasons:   The valid total cholesterol range is 130 to 320 mg/dL  Arlon Bergamo, BSN, RN

## 2024-02-04 ENCOUNTER — Other Ambulatory Visit: Payer: Self-pay

## 2024-02-04 DIAGNOSIS — E1169 Type 2 diabetes mellitus with other specified complication: Secondary | ICD-10-CM

## 2024-02-04 MED ORDER — ROSUVASTATIN CALCIUM 20 MG PO TABS: ORAL_TABLET | ORAL | 3 refills | Status: DC

## 2024-02-04 NOTE — Telephone Encounter (Signed)
 Medication sent to pharmacy

## 2024-02-19 ENCOUNTER — Other Ambulatory Visit (HOSPITAL_COMMUNITY): Payer: Self-pay

## 2024-02-25 ENCOUNTER — Other Ambulatory Visit: Payer: Self-pay | Admitting: Infectious Disease

## 2024-02-25 DIAGNOSIS — B20 Human immunodeficiency virus [HIV] disease: Secondary | ICD-10-CM

## 2024-02-27 ENCOUNTER — Ambulatory Visit (INDEPENDENT_AMBULATORY_CARE_PROVIDER_SITE_OTHER): Admitting: Infectious Diseases

## 2024-02-27 ENCOUNTER — Encounter: Payer: Self-pay | Admitting: Infectious Diseases

## 2024-02-27 ENCOUNTER — Other Ambulatory Visit: Payer: Self-pay

## 2024-02-27 VITALS — BP 123/83 | HR 79 | Temp 97.7°F | Ht 66.0 in | Wt 230.0 lb

## 2024-02-27 DIAGNOSIS — R369 Urethral discharge, unspecified: Secondary | ICD-10-CM

## 2024-02-27 MED ORDER — CEFTRIAXONE SODIUM 500 MG IJ SOLR
500.0000 mg | Freq: Once | INTRAMUSCULAR | Status: AC
  Administered 2024-02-27: 500 mg via INTRAMUSCULAR

## 2024-02-27 MED ORDER — DOXYCYCLINE HYCLATE 100 MG PO TABS: 100.0000 mg | ORAL_TABLET | Freq: Two times a day (BID) | ORAL | 0 refills | Status: DC

## 2024-02-27 NOTE — Progress Notes (Signed)
 Subjective:     Wayne Wolfe is a 58 y.o. male patient here today with concern over STI exposure/symptoms .   Discussed the use of AI scribe software for clinical note transcription with the patient, who gave verbal consent to proceed.  History of Present Illness   Wayne Wolfe is a 58 year old male who presents with urethral discharge and possibly some rectal symptoms.   He has been experiencing urethral and penile symptoms, including drainage. No rectal discharge but has had some drainage in underwear and he does experience discomfort with bowel movements and changes in stool appearance.  He has male sexual partners and describes himself as a versatile partner, including with his recent partner. New partner within the last 2 weeks.       Review of Systems: Review of Systems  Constitutional:  Negative for chills and fever.  HENT:  Negative for sore throat.   Eyes:  Negative for visual disturbance.  Gastrointestinal:  Positive for rectal pain. Negative for abdominal pain and anal bleeding.  Genitourinary:  Positive for dysuria and penile discharge. Negative for genital sores, penile pain, scrotal swelling and testicular pain.  Musculoskeletal:  Negative for arthralgias and joint swelling.  Skin:  Negative for rash and wound.  Neurological:  Negative for headaches.  Hematological:  Negative for adenopathy.    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   Hypertension 2009   Well controlled on HCTZ   Hypokalemia 01/12/2019   Knee effusion, right 07/19/2017   Lobar  pneumonia (HCC) 01/12/2019   Obesity 10/19/2020   Obstructive sleep apnea 10/19/2020   Osteoarthritis 10/16/2023   Otitis media 03/13/2017   Polyuria 03/13/2017   Poorly controlled diabetes mellitus (HCC) 03/13/2017   Type 2 diabetes mellitus (HCC)    Well controlled on metformin    Urine abnormality 07/04/2023   Vaccine counseling 07/04/2023   Weight loss 03/13/2017    Outpatient Medications Prior to Visit  Medication Sig Dispense Refill   Accu-Chek FastClix Lancets MISC Check blood sugar before meals and after meals 102 each 12   acetaminophen  (TYLENOL ) 500 MG tablet Take 1,000 mg by mouth every 6 (six) hours as needed for fever.     BIKTARVY  50-200-25 MG TABS tablet TAKE 1 TABLET BY MOUTH DAILY 30 tablet 10   Blood Glucose Monitoring Suppl (ACCU-CHEK GUIDE) w/Device KIT 1 each by Does not apply route 6 (six) times daily. 1 kit 0   Continuous Glucose Sensor (DEXCOM G7 SENSOR) MISC Change sensor every 10 days or as directed by your provider (for diabetes). 3 each 3   diclofenac  Sodium (VOLTAREN ) 1 % GEL Apply 4 grams topically 4 (four) times daily as needed 100 g 3   empagliflozin  (JARDIANCE ) 10 MG TABS tablet Take 1 tablet (10 mg total) by mouth daily before breakfast. 90 tablet 0   fexofenadine  (ALLEGRA ) 180 MG tablet TAKE 1 TABLET(180 MG) BY MOUTH DAILY 30 tablet 5   gabapentin  (NEURONTIN ) 300 MG capsule Take 1 capsule (300 mg total) by mouth 3 (three) times daily. 90 capsule  2   glucose blood (ACCU-CHEK GUIDE) test strip Check blood sugar before meals and after meals 100 each 12   insulin  glargine (LANTUS ) 100 UNIT/ML Solostar Pen Inject 30 Units into the skin daily. 15 mL 3   Insulin  Pen Needle (PEN NEEDLES) 32G X 4 MM MISC Use daily as directed. 200 each 1   lisinopril  (ZESTRIL ) 20 MG tablet Take 1 tablet (20 mg total) by mouth daily. 90 tablet 3   metFORMIN  (GLUCOPHAGE -XR) 500 MG 24 hr tablet Take 2 tablets (1,000 mg total) by mouth 2 (two) times daily with a meal. 360 tablet 2    methocarbamol  (ROBAXIN ) 750 MG tablet TAKE 2 TABLETS(1500 MG) BY MOUTH FOUR TIMES DAILY FOR 3 DAYS THEN TAKE 2 TABLETS(1500 MG) BY MOUTH THREE TIMES DAILY FOR 4 DAYS 48 tablet 0   metoprolol  tartrate (LOPRESSOR ) 100 MG tablet Take 1 tablet by mouth two hours prior to scan 1 tablet 0   rosuvastatin  (CRESTOR ) 20 MG tablet TAKE 1 TABLET(20 MG) BY MOUTH DAILY 90 tablet 3   Semaglutide , 2 MG/DOSE, (OZEMPIC , 2 MG/DOSE,) 8 MG/3ML SOPN Inject 2 mg into the skin once a week. 3 mL 10   No facility-administered medications prior to visit.    No Known Allergies    Objective   Vitals:   02/27/24 1000  BP: 123/83  Pulse: 79  Temp: 97.7 F (36.5 C)  SpO2: 97%   Body mass index is 37.12 kg/m.   Physical Exam Constitutional:      Appearance: Normal appearance. He is not ill-appearing.  HENT:     Head: Normocephalic.     Mouth/Throat:     Mouth: Mucous membranes are moist.     Pharynx: Oropharynx is clear.  Eyes:     General: No scleral icterus. Cardiovascular:     Rate and Rhythm: Normal rate.  Pulmonary:     Effort: Pulmonary effort is normal.  Musculoskeletal:        General: Normal range of motion.     Cervical back: Normal range of motion.  Skin:    Coloration: Skin is not jaundiced or pale.  Neurological:     Mental Status: He is alert and oriented to person, place, and time.  Psychiatric:        Mood and Affect: Mood normal.        Judgment: Judgment normal.       Assessment & Plan:      Penile Discharge -  Rectal Discomfort -  Suspected gonorrhea / chlamydia infection. Discussed treatment options - will collect site specific testing and provide ceftriaxone  today in the office. Doxycycline  sent to pharmacy.  - Administer ceftriaxone  injection in the office today. - Perform throat, urine, and rectal swabs   - Perform a blood test for syphilis.    Medication Counseling -  Esophagitis, photosensitivity and nausea precautions discussed with the doxycycline .        Meds ordered this encounter  Medications   doxycycline  (VIBRA -TABS) 100 MG tablet    Sig: Take 1 tablet (100 mg total) by mouth 2 (two) times daily.    Dispense:  14 tablet    Refill:  0   cefTRIAXone  (ROCEPHIN ) injection 500 mg   Orders Placed This Encounter  Procedures   C. trachomatis/N. gonorrhoeae RNA   GC/CT Probe, Amp (Throat)   CT/NG RNA, TMA Rectal   RPR   07/13/2024 appt with Dr. Ernie Heal for routine HIV care   Gibson Kurtz, MSN, NP-C [facility] Office: [phone] Pager: [phone]

## 2024-02-27 NOTE — Patient Instructions (Signed)
 Will get you a shot of an antibiotic called ceftriaxone  today and send in a prescription called doxycycline  to take one tablet TWICE a day for 7 days.   Please stop by the lab on your way out   We will call you when your results are back.   DOXYCYCLINE  Instructions:  Take twice a day with food (breakfast and dinner) Make sure you drink a full 8 oz glass of water with each dose Sit upright for 1 hour after to prevent heartburn Careful in the sun - can make you more at risk for sun burns (bad ones...) Limit sun exposure between 10-4 Sun block! Don't forget to reapply it Hats Longer sleeves/pants    You are scheduled to see Dr. Ernie Heal on 07/13/2024

## 2024-02-28 LAB — C. TRACHOMATIS/N. GONORRHOEAE RNA
C. trachomatis RNA, TMA: NOT DETECTED
N. gonorrhoeae RNA, TMA: NOT DETECTED

## 2024-02-28 LAB — CT/NG RNA, TMA RECTAL
Chlamydia Trachomatis RNA: NOT DETECTED
Neisseria Gonorrhoeae RNA: NOT DETECTED

## 2024-02-28 LAB — GC/CHLAMYDIA PROBE, AMP (THROAT)
Chlamydia trachomatis RNA: NOT DETECTED
Neisseria gonorrhoeae RNA: NOT DETECTED

## 2024-02-29 LAB — T PALLIDUM AB: T Pallidum Abs: POSITIVE — AB

## 2024-02-29 LAB — RPR TITER: RPR Titer: 1:2 {titer} — ABNORMAL HIGH

## 2024-02-29 LAB — RPR: RPR Ser Ql: REACTIVE — AB

## 2024-03-04 ENCOUNTER — Ambulatory Visit: Payer: Self-pay | Admitting: Infectious Diseases

## 2024-03-04 NOTE — Telephone Encounter (Signed)
 Spoke with patient who states he feels better since 5/29. Symptoms have resolved.  Julien Odor, RMA

## 2024-03-04 NOTE — Telephone Encounter (Signed)
-----   Message from Keyes sent at 03/04/2024  1:46 PM EDT ----- Please call Myrtie Atkinson to see how he feeling after we treated him for presumed urethritis related to STD.  All of his tests actually did return negative for gonorrhea and chlamydia.   I am hopeful he has had a resolution. If not, would see if we can get him back to reconsider with Dr. Ernie Heal if he has availability

## 2024-03-23 ENCOUNTER — Telehealth: Payer: Self-pay

## 2024-03-23 NOTE — Telephone Encounter (Signed)
 Patient called, is currently at [facility]. Requested that his last CD4 and viral load be faxed to [facility] office. Labs faxed.   Frederick Klinger, BSN, RN

## 2024-04-07 ENCOUNTER — Other Ambulatory Visit: Payer: Self-pay | Admitting: Student

## 2024-04-07 ENCOUNTER — Other Ambulatory Visit (HOSPITAL_COMMUNITY): Payer: Self-pay

## 2024-04-07 DIAGNOSIS — E1159 Type 2 diabetes mellitus with other circulatory complications: Secondary | ICD-10-CM

## 2024-04-07 MED ORDER — LISINOPRIL 20 MG PO TABS
20.0000 mg | ORAL_TABLET | Freq: Every day | ORAL | 3 refills | Status: AC
  Filled 2024-04-07 – 2024-04-21 (×2): qty 90, 90d supply, fill #0
  Filled 2024-08-06: qty 30, 30d supply, fill #1
  Filled 2024-09-15: qty 30, 30d supply, fill #2

## 2024-04-07 NOTE — Telephone Encounter (Signed)
 Medication sent to pharmacy

## 2024-04-09 ENCOUNTER — Other Ambulatory Visit (HOSPITAL_COMMUNITY): Payer: Self-pay

## 2024-04-16 ENCOUNTER — Other Ambulatory Visit (HOSPITAL_COMMUNITY): Payer: Self-pay

## 2024-04-21 ENCOUNTER — Other Ambulatory Visit (HOSPITAL_COMMUNITY): Payer: Self-pay

## 2024-05-05 ENCOUNTER — Other Ambulatory Visit (HOSPITAL_COMMUNITY): Payer: Self-pay

## 2024-05-05 ENCOUNTER — Other Ambulatory Visit: Payer: Self-pay

## 2024-05-08 ENCOUNTER — Other Ambulatory Visit: Payer: Self-pay

## 2024-05-08 ENCOUNTER — Other Ambulatory Visit (HOSPITAL_COMMUNITY): Payer: Self-pay

## 2024-05-15 ENCOUNTER — Other Ambulatory Visit: Payer: Self-pay | Admitting: Student

## 2024-05-15 DIAGNOSIS — M79605 Pain in left leg: Secondary | ICD-10-CM

## 2024-05-25 ENCOUNTER — Encounter: Admitting: Family Medicine

## 2024-05-25 NOTE — Progress Notes (Deleted)
 New Patient Visit  Subjective:     Patient ID: Wayne Wolfe, male    DOB: July 28, 1966, 58 y.o.   MRN: [id number]  No chief complaint on file.   HPI  Discussed the use of AI scribe software for clinical note transcription with the patient, who gave verbal consent to proceed.  History of Present Illness      ROS Per HPI  Outpatient Encounter Medications as of 05/25/2024  Medication Sig   Accu-Chek FastClix Lancets MISC Check blood sugar before meals and after meals   acetaminophen  (TYLENOL ) 500 MG tablet Take 1,000 mg by mouth every 6 (six) hours as needed for fever.   BIKTARVY  50-200-25 MG TABS tablet TAKE 1 TABLET BY MOUTH DAILY   Blood Glucose Monitoring Suppl (ACCU-CHEK GUIDE) w/Device KIT 1 each by Does not apply route 6 (six) times daily.   Continuous Glucose Sensor (DEXCOM G7 SENSOR) MISC Change sensor every 10 days or as directed by your provider (for diabetes).   diclofenac  Sodium (VOLTAREN ) 1 % GEL Apply 4 grams topically 4 (four) times daily as needed   doxycycline  (VIBRA -TABS) 100 MG tablet Take 1 tablet (100 mg total) by mouth 2 (two) times daily.   empagliflozin  (JARDIANCE ) 10 MG TABS tablet Take 1 tablet (10 mg total) by mouth daily before breakfast.   fexofenadine  (ALLEGRA ) 180 MG tablet TAKE 1 TABLET(180 MG) BY MOUTH DAILY   gabapentin  (NEURONTIN ) 300 MG capsule Take 1 capsule (300 mg total) by mouth 3 (three) times daily.   glucose blood (ACCU-CHEK GUIDE) test strip Check blood sugar before meals and after meals   insulin  glargine (LANTUS ) 100 UNIT/ML Solostar Pen Inject 30 Units into the skin daily.   Insulin  Pen Needle (PEN NEEDLES) 32G X 4 MM MISC Use daily as directed.   lisinopril  (ZESTRIL ) 20 MG tablet Take 1 tablet (20 mg total) by mouth daily.   metFORMIN  (GLUCOPHAGE -XR) 500 MG 24 hr tablet Take 2 tablets (1,000 mg total) by mouth 2 (two) times daily with a meal.   methocarbamol  (ROBAXIN ) 750 MG tablet TAKE 2 TABLETS(1500 MG) BY MOUTH FOUR TIMES DAILY  FOR 3 DAYS THEN TAKE 2 TABLETS(1500 MG) BY MOUTH THREE TIMES DAILY FOR 4 DAYS   metoprolol  tartrate (LOPRESSOR ) 100 MG tablet Take 1 tablet by mouth two hours prior to scan   rosuvastatin  (CRESTOR ) 20 MG tablet TAKE 1 TABLET(20 MG) BY MOUTH DAILY   Semaglutide , 2 MG/DOSE, (OZEMPIC , 2 MG/DOSE,) 8 MG/3ML SOPN Inject 2 mg into the skin once a week.   No facility-administered encounter medications on file as of 05/25/2024.    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   Hypertension 2009   Well controlled on HCTZ   Hypokalemia 01/12/2019   Knee effusion, right 07/19/2017   Lobar pneumonia (HCC) 01/12/2019   Obesity 10/19/2020   Obstructive sleep apnea 10/19/2020   Osteoarthritis 10/16/2023   Otitis media 03/13/2017   Polyuria 03/13/2017   Poorly controlled diabetes mellitus (HCC) 03/13/2017   Type 2 diabetes mellitus (HCC)    Well controlled on metformin    Urine  abnormality 07/04/2023   Vaccine counseling 07/04/2023   Weight loss 03/13/2017    Past Surgical History:  Procedure Laterality Date   CARPAL TUNNEL RELEASE Right 1990s   none      Family History  Problem Relation Age of Onset   Diabetes Mellitus II Sister    Colon polyps Sister    Diabetes Mellitus II Mother    Coronary artery disease Mother 52       CABGx6   Coronary artery disease Sister 85       CABG   Diabetes Mellitus II Sister    Coronary artery disease Sister    Diabetes Mellitus II Sister    Colon cancer Neg Hx    Esophageal cancer Neg Hx    Rectal cancer Neg Hx    Stomach cancer Neg Hx     Social  History   Socioeconomic History   Marital status: Single    Spouse name: Not on file   Number of children: Not on file   Years of education: 13   Highest education level: Not on file  Occupational History    Employer: UNEMPLOYED  Tobacco Use   Smoking status: Former    Current packs/day: 0.00    Types: Cigarettes    Quit date: 2003    Years since quitting: 22.6   Smokeless tobacco: Never  Vaping Use   Vaping status: Never Used  Substance and Sexual Activity   Alcohol use: No    Alcohol/week: 0.0 standard drinks of alcohol    Comment: Last used 2004   Drug use: No    Comment: Last used 2003   Sexual activity: Yes    Partners: Male  Other Topics Concern   Not on file  Social History Narrative   On disability. Lives in [place].    Social Drivers of Health   Financial Resource Strain: Medium Risk (11/20/2023)   Overall Financial Resource Strain (CARDIA)    Difficulty of Paying Living Expenses: Somewhat hard  Food Insecurity: Low Risk  (02/05/2024)   Received from [facility]   Hunger Vital Sign    Within the past 12 months, you worried that your food would run out before you got money to buy more: Never true    Within the past 12 months, the food you bought just didn't last and you didn't have money to get more. : Never true  Recent Concern: Food Insecurity - Food Insecurity Present (11/20/2023)   Hunger Vital Sign    Worried About Running Out of Food in the Last Year: Sometimes true    Ran Out of Food in the Last Year: Sometimes true  Transportation Needs: No Transportation Needs (02/05/2024)   Received from [facility]    In the past 12 months, has lack of reliable transportation kept you from medical appointments, meetings, work or from getting things needed for daily living? : No  Physical Activity: Inactive (11/20/2023)   Exercise Vital Sign    Days of Exercise per Week: 0 days    Minutes of Exercise per Session: 0 min  Stress: Stress Concern  Present (11/20/2023)   [employer] of Occupational Health - Occupational Stress Questionnaire    Feeling of Stress : To some extent  Social Connections: Moderately Integrated (11/20/2023)   Social Connection and Isolation Panel    Frequency of Communication with Friends and Family: More than three times a week    Frequency of Social Gatherings with Friends and Family: Three times a  week    Attends Religious Services: More than 4 times per year    Active Member of Clubs or Organizations: Yes    Attends Banker Meetings: 1 to 4 times per year    Marital Status: Never married  Intimate Partner Violence: Not At Risk (11/20/2023)   Humiliation, Afraid, Rape, and Kick questionnaire    Fear of Current or Ex-Partner: No    Emotionally Abused: No    Physically Abused: No    Sexually Abused: No       Objective:    There were no vitals taken for this visit.   Physical Exam Vitals and nursing note reviewed.  Constitutional:      General: He is not in acute distress.    Appearance: Normal appearance.  HENT:     Head: Normocephalic and atraumatic.     Right Ear: External ear normal.     Left Ear: External ear normal.     Nose: Nose normal.     Mouth/Throat:     Mouth: Mucous membranes are moist.     Pharynx: Oropharynx is clear.  Eyes:     Extraocular Movements: Extraocular movements intact.  Cardiovascular:     Rate and Rhythm: Normal rate and regular rhythm.     Pulses: Normal pulses.     Heart sounds: Normal heart sounds.  Pulmonary:     Effort: Pulmonary effort is normal. No respiratory distress.     Breath sounds: Normal breath sounds. No wheezing, rhonchi or rales.  Musculoskeletal:        General: Normal range of motion.     Cervical back: Normal range of motion.     Right lower leg: No edema.     Left lower leg: No edema.  Lymphadenopathy:     Cervical: No cervical adenopathy.  Skin:    General: Skin is warm and dry.  Neurological:     General: No  focal deficit present.     Mental Status: He is alert and oriented to person, place, and time.  Psychiatric:        Mood and Affect: Mood normal.        Behavior: Behavior normal.     No results found for any visits on 05/25/24.      Assessment & Plan:   Assessment and Plan Assessment & Plan      No orders of the defined types were placed in this encounter.    No orders of the defined types were placed in this encounter.   No follow-ups on file.  Corean LITTIE Ku, FNP

## 2024-06-04 ENCOUNTER — Other Ambulatory Visit: Payer: Self-pay

## 2024-06-04 ENCOUNTER — Other Ambulatory Visit: Payer: Self-pay | Admitting: Student

## 2024-06-04 DIAGNOSIS — E1169 Type 2 diabetes mellitus with other specified complication: Secondary | ICD-10-CM

## 2024-06-05 ENCOUNTER — Other Ambulatory Visit (HOSPITAL_COMMUNITY): Payer: Self-pay

## 2024-06-05 MED ORDER — LANTUS SOLOSTAR 100 UNIT/ML ~~LOC~~ SOPN
30.0000 [IU] | PEN_INJECTOR | Freq: Every day | SUBCUTANEOUS | 3 refills | Status: DC
  Filled 2024-06-05: qty 15, 50d supply, fill #0
  Filled 2024-08-06: qty 9, 30d supply, fill #1
  Filled 2024-09-15: qty 9, 30d supply, fill #2

## 2024-06-08 ENCOUNTER — Ambulatory Visit (INDEPENDENT_AMBULATORY_CARE_PROVIDER_SITE_OTHER): Admitting: Podiatry

## 2024-06-08 ENCOUNTER — Encounter: Payer: Self-pay | Admitting: Podiatry

## 2024-06-08 VITALS — Ht 66.0 in | Wt 230.0 lb

## 2024-06-08 DIAGNOSIS — M216X2 Other acquired deformities of left foot: Secondary | ICD-10-CM

## 2024-06-08 MED ORDER — CYCLOBENZAPRINE HCL 10 MG PO TABS: 10.0000 mg | ORAL_TABLET | Freq: Three times a day (TID) | ORAL | 1 refills | Status: AC | PRN

## 2024-06-08 NOTE — Progress Notes (Signed)
   Chief Complaint  Patient presents with   Callouses    Pt is here due to callous on both the great toes, states he would like them shave off, complains of cramps to his lower extremities.    Subjective: 58 y.o. male presenting to the office today for evaluation of symptomatic calluses to the bilateral great toes.  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   Hypertension 2009   Well controlled on HCTZ   Hypokalemia 01/12/2019   Knee effusion, right 07/19/2017   Lobar pneumonia (HCC) 01/12/2019   Obesity 10/19/2020   Obstructive sleep apnea 10/19/2020   Osteoarthritis 10/16/2023   Otitis media 03/13/2017   Polyuria 03/13/2017   Poorly controlled diabetes mellitus (HCC) 03/13/2017   Type 2 diabetes mellitus (HCC)    Well controlled on metformin    Urine abnormality 07/04/2023   Vaccine counseling 07/04/2023   Weight loss 03/13/2017    Past Surgical History:  Procedure Laterality Date   CARPAL TUNNEL RELEASE Right 1990s   none      No Known Allergies   Objective:  Physical Exam General: Alert and oriented x3 in no acute distress  Dermatology: Hyperkeratotic lesion(s) present on the bilateral great toes. Pain on palpation with a central nucleated core noted. Skin is warm, dry and supple bilateral lower extremities. Negative for open lesions or macerations.  Vascular: Palpable pedal pulses bilaterally. No edema or erythema noted. Capillary refill within normal limits.  Neurological: Grossly  intact via light touch  Musculoskeletal Exam: Pain on palpation at the keratotic lesion(s) noted. Range of motion within normal limits bilateral. Muscle strength 5/5 in all groups bilateral.  Assessment: 1.  Symptomatic benign skin lesion 2.  Intermittent foot cramps  Plan of Care:  -Patient evaluated -Excisional debridement of keratoic lesion(s) using a chisel blade was performed without incident.  -Salicylic acid applied with a bandaid -Prescription for Flexeril  10 mg 3 times daily as needed -Return to the clinic PRN.   Thresa EMERSON Sar, DPM [facility]  Dr. Thresa EMERSON Sar, DPM    [address] [place], [place] [address]                Office [phone]  Fax [phone]

## 2024-06-10 ENCOUNTER — Telehealth: Payer: Self-pay

## 2024-06-10 NOTE — Telephone Encounter (Signed)
 Wayne Wolfe called, requested that when his next refill is due that it be sent as a 90 day supply. Discussed we can try, but that it is ultimately up to Medicaid as to how much they will allow to be dispensed at a time.   Wayne Wolfe, BSN, RN

## 2024-06-15 ENCOUNTER — Telehealth: Payer: Self-pay

## 2024-06-15 ENCOUNTER — Other Ambulatory Visit: Payer: Self-pay | Admitting: Infectious Diseases

## 2024-06-15 ENCOUNTER — Ambulatory Visit (INDEPENDENT_AMBULATORY_CARE_PROVIDER_SITE_OTHER)

## 2024-06-15 ENCOUNTER — Other Ambulatory Visit: Payer: Self-pay

## 2024-06-15 DIAGNOSIS — B20 Human immunodeficiency virus [HIV] disease: Secondary | ICD-10-CM

## 2024-06-15 DIAGNOSIS — Z23 Encounter for immunization: Secondary | ICD-10-CM | POA: Diagnosis present

## 2024-06-15 MED ORDER — BIKTARVY 50-200-25 MG PO TABS: 1.0000 | ORAL_TABLET | Freq: Every day | ORAL | 1 refills | Status: DC

## 2024-06-15 MED ORDER — SHINGRIX 50 MCG/0.5ML IM SUSR: 0.5000 mL | Freq: Once | INTRAMUSCULAR | 0 refills | Status: AC

## 2024-06-15 NOTE — Telephone Encounter (Signed)
 Patient came in today for Flu vaccine. During visit requested 90 day script for his Biktravy to be  called into [facility]. Refills sent as requested. Lorenda CHRISTELLA Code, RMA

## 2024-07-12 NOTE — Progress Notes (Unsigned)
 Subjective:  Chief complaint: follow-up for HIV disease on medications   Patient ID: Wayne Wolfe, male    DOB: 05/11/1966, 58 y.o.   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   Hypertension 2009   Well controlled on HCTZ   Hypokalemia 01/12/2019   Knee effusion, right 07/19/2017   Lobar pneumonia 01/12/2019   Obesity 10/19/2020   Obstructive sleep apnea 10/19/2020   Osteoarthritis 10/16/2023   Otitis media 03/13/2017   Polyuria 03/13/2017   Poorly controlled diabetes mellitus (HCC) 03/13/2017   Type 2 diabetes mellitus (HCC)    Well controlled on metformin    Urine abnormality 07/04/2023   Vaccine counseling 07/04/2023   Weight loss 03/13/2017    Past Surgical History:  Procedure Laterality Date   CARPAL TUNNEL RELEASE Right 1990s   none      Family History  Problem Relation Age of Onset   Diabetes Mellitus II Sister    Colon polyps Sister    Diabetes Mellitus II Mother    Coronary artery disease Mother 12       CABGx6   Coronary artery disease Sister 35       CABG   Diabetes Mellitus II Sister    Coronary artery disease Sister    Diabetes Mellitus II Sister    Colon cancer Neg Hx    Esophageal cancer Neg Hx    Rectal cancer Neg Hx    Stomach cancer Neg Hx       Social History   Socioeconomic History   Marital status: Single    Spouse name: Not on file   Number of children: Not on file   Years of education: 13   Highest education level: Not on file  Occupational History    Employer: UNEMPLOYED  Tobacco Use   Smoking  status: Former    Current packs/day: 0.00    Types: Cigarettes    Quit date: 2003    Years since quitting: 22.7   Smokeless tobacco: Never  Vaping Use   Vaping status: Never Used  Substance and Sexual Activity   Alcohol use: No    Alcohol/week: 0.0 standard drinks of alcohol    Comment: Last used 2004   Drug use: No    Comment: Last used 2003   Sexual activity: Yes    Partners: Male  Other Topics Concern   Not on file  Social History Narrative   On disability. Lives in [place].    Social Drivers of Health   Financial Resource Strain: Medium Risk (11/20/2023)   Overall Financial Resource Strain (CARDIA)    Difficulty of Paying Living Expenses: Somewhat hard  Food Insecurity: Low Risk  (02/05/2024)   Received from [facility]   Hunger Vital Sign    Within the past 12 months, you worried that your food would run out before you got money to buy more: Never true    Within the past 12 months, the food you bought just didn't last and you didn't have money to get more. : Never true  Recent  Concern: Food Insecurity - Food Insecurity Present (11/20/2023)   Hunger Vital Sign    Worried About Running Out of Food in the Last Year: Sometimes true    Ran Out of Food in the Last Year: Sometimes true  Transportation Needs: No Transportation Needs (02/05/2024)   Received from [facility]    In the past 12 months, has lack of reliable transportation kept you from medical appointments, meetings, work or from getting things needed for daily living? : No  Physical Activity: Inactive (11/20/2023)   Exercise Vital Sign    Days of Exercise per Week: 0 days    Minutes of Exercise per Session: 0 min  Stress: Stress Concern Present (11/20/2023)   [employer] of Occupational Health - Occupational Stress Questionnaire    Feeling of Stress : To some extent  Social Connections: Moderately Integrated (11/20/2023)   Social Connection and Isolation Panel    Frequency of  Communication with Friends and Family: More than three times a week    Frequency of Social Gatherings with Friends and Family: Three times a week    Attends Religious Services: More than 4 times per year    Active Member of Clubs or Organizations: Yes    Attends Banker Meetings: 1 to 4 times per year    Marital Status: Never married    No Known Allergies   Current Outpatient Medications:    Accu-Chek FastClix Lancets MISC, Check blood sugar before meals and after meals, Disp: 102 each, Rfl: 12   acetaminophen  (TYLENOL ) 500 MG tablet, Take 1,000 mg by mouth every 6 (six) hours as needed for fever., Disp: , Rfl:    bictegravir-emtricitabine -tenofovir  AF (BIKTARVY ) 50-200-25 MG TABS tablet, Take 1 tablet by mouth daily., Disp: 90 tablet, Rfl: 1   Blood Glucose Monitoring Suppl (ACCU-CHEK GUIDE) w/Device KIT, 1 each by Does not apply route 6 (six) times daily., Disp: 1 kit, Rfl: 0   Continuous Glucose Sensor (DEXCOM G7 SENSOR) MISC, Change sensor every 10 days or as directed by your provider (for diabetes)., Disp: 3 each, Rfl: 3   cyclobenzaprine  (FLEXERIL ) 10 MG tablet, Take 1 tablet (10 mg total) by mouth 3 (three) times daily as needed for muscle spasms., Disp: 30 tablet, Rfl: 1   diclofenac  Sodium (VOLTAREN ) 1 % GEL, Apply 4 grams topically 4 (four) times daily as needed, Disp: 100 g, Rfl: 3   doxycycline  (VIBRA -TABS) 100 MG tablet, Take 1 tablet (100 mg total) by mouth 2 (two) times daily., Disp: 14 tablet, Rfl: 0   empagliflozin  (JARDIANCE ) 10 MG TABS tablet, Take 1 tablet (10 mg total) by mouth daily before breakfast., Disp: 90 tablet, Rfl: 0   fexofenadine  (ALLEGRA ) 180 MG tablet, TAKE 1 TABLET(180 MG) BY MOUTH DAILY, Disp: 30 tablet, Rfl: 5   gabapentin  (NEURONTIN ) 300 MG capsule, Take 1 capsule (300 mg total) by mouth 3 (three) times daily., Disp: 90 capsule, Rfl: 2   glucose blood (ACCU-CHEK GUIDE) test strip, Check blood sugar before meals and after meals, Disp: 100 each,  Rfl: 12   insulin  glargine (LANTUS  SOLOSTAR) 100 UNIT/ML Solostar Pen, Inject 30 Units into the skin daily., Disp: 15 mL, Rfl: 3   Insulin  Pen Needle (PEN NEEDLES) 32G X 4 MM MISC, Use daily as directed., Disp: 200 each, Rfl: 1   lisinopril  (ZESTRIL ) 20 MG tablet, Take 1 tablet (20 mg total) by mouth daily., Disp: 90 tablet, Rfl: 3   metFORMIN  (GLUCOPHAGE -XR) 500 MG 24 hr tablet, Take 2  tablets (1,000 mg total) by mouth 2 (two) times daily with a meal., Disp: 360 tablet, Rfl: 2   methocarbamol  (ROBAXIN ) 750 MG tablet, TAKE 2 TABLETS(1500 MG) BY MOUTH FOUR TIMES DAILY FOR 3 DAYS THEN TAKE 2 TABLETS(1500 MG) BY MOUTH THREE TIMES DAILY FOR 4 DAYS, Disp: 48 tablet, Rfl: 0   metoprolol  tartrate (LOPRESSOR ) 100 MG tablet, Take 1 tablet by mouth two hours prior to scan, Disp: 1 tablet, Rfl: 0   rosuvastatin  (CRESTOR ) 20 MG tablet, TAKE 1 TABLET(20 MG) BY MOUTH DAILY, Disp: 90 tablet, Rfl: 3   Semaglutide , 2 MG/DOSE, (OZEMPIC , 2 MG/DOSE,) 8 MG/3ML SOPN, Inject 2 mg into the skin once a week., Disp: 3 mL, Rfl: 10   Review of Systems     Objective:   Physical Exam        Assessment & Plan:

## 2024-07-13 ENCOUNTER — Encounter: Payer: Self-pay | Admitting: Infectious Disease

## 2024-07-13 ENCOUNTER — Ambulatory Visit (INDEPENDENT_AMBULATORY_CARE_PROVIDER_SITE_OTHER): Admitting: Infectious Disease

## 2024-07-13 ENCOUNTER — Other Ambulatory Visit: Payer: Self-pay

## 2024-07-13 VITALS — BP 141/90 | HR 83 | Temp 98.3°F | Wt 234.0 lb

## 2024-07-13 DIAGNOSIS — Z23 Encounter for immunization: Secondary | ICD-10-CM | POA: Diagnosis not present

## 2024-07-13 DIAGNOSIS — E1169 Type 2 diabetes mellitus with other specified complication: Secondary | ICD-10-CM | POA: Diagnosis not present

## 2024-07-13 DIAGNOSIS — K8681 Exocrine pancreatic insufficiency: Secondary | ICD-10-CM

## 2024-07-13 DIAGNOSIS — I152 Hypertension secondary to endocrine disorders: Secondary | ICD-10-CM

## 2024-07-13 DIAGNOSIS — B20 Human immunodeficiency virus [HIV] disease: Secondary | ICD-10-CM

## 2024-07-13 DIAGNOSIS — Z79899 Other long term (current) drug therapy: Secondary | ICD-10-CM | POA: Diagnosis not present

## 2024-07-13 DIAGNOSIS — E1140 Type 2 diabetes mellitus with diabetic neuropathy, unspecified: Secondary | ICD-10-CM

## 2024-07-13 DIAGNOSIS — K6282 Dysplasia of anus: Secondary | ICD-10-CM

## 2024-07-13 DIAGNOSIS — E1165 Type 2 diabetes mellitus with hyperglycemia: Secondary | ICD-10-CM

## 2024-07-13 MED ORDER — BIKTARVY 50-200-25 MG PO TABS: 1.0000 | ORAL_TABLET | Freq: Every day | ORAL | 3 refills | Status: DC

## 2024-07-13 MED ORDER — GABAPENTIN 300 MG PO CAPS
300.0000 mg | ORAL_CAPSULE | Freq: Three times a day (TID) | ORAL | 3 refills | Status: AC
Start: 2024-07-13 — End: ?

## 2024-07-14 LAB — GC/CHLAMYDIA PROBE, AMP (THROAT)
Chlamydia trachomatis RNA: NOT DETECTED
Neisseria gonorrhoeae RNA: NOT DETECTED

## 2024-07-14 LAB — CT/NG RNA, TMA RECTAL
Chlamydia Trachomatis RNA: NOT DETECTED
Neisseria Gonorrhoeae RNA: NOT DETECTED

## 2024-07-14 LAB — C. TRACHOMATIS/N. GONORRHOEAE RNA
C. trachomatis RNA, TMA: NOT DETECTED
N. gonorrhoeae RNA, TMA: NOT DETECTED

## 2024-07-15 ENCOUNTER — Ambulatory Visit

## 2024-07-15 LAB — LIPID PANEL
Cholesterol: 108 mg/dL (ref <200)
HDL: 31 mg/dL — ABNORMAL LOW (ref >=40)
LDL Cholesterol (Calc): 61 mg/dL
Non-HDL Cholesterol (Calc): 77 mg/dL (ref <130)
Total CHOL/HDL Ratio: 3.5 (calc) (ref <5.0)
Triglycerides: 79 mg/dL (ref <150)

## 2024-07-15 LAB — COMPLETE METABOLIC PANEL WITHOUT GFR
AG Ratio: 1.6 (calc) (ref 1.0–2.5)
ALT: 14 U/L (ref 9–46)
AST: 13 U/L (ref 10–35)
Albumin: 4.3 g/dL (ref 3.6–5.1)
Alkaline phosphatase (APISO): 55 U/L (ref 35–144)
BUN: 9 mg/dL (ref 7–25)
CO2: 28 mmol/L (ref 20–32)
Calcium: 9 mg/dL (ref 8.6–10.3)
Chloride: 103 mmol/L (ref 98–110)
Creat: 1 mg/dL (ref 0.70–1.30)
Globulin: 2.7 g/dL (ref 1.9–3.7)
Glucose, Bld: 83 mg/dL (ref 65–99)
Potassium: 4.1 mmol/L (ref 3.5–5.3)
Sodium: 138 mmol/L (ref 135–146)
Total Bilirubin: 0.5 mg/dL (ref 0.2–1.2)
Total Protein: 7 g/dL (ref 6.1–8.1)

## 2024-07-15 LAB — CBC WITH DIFFERENTIAL/PLATELET
Absolute Lymphocytes: 2683 {cells}/uL (ref 850–3900)
Absolute Monocytes: 402 {cells}/uL (ref 200–950)
Basophils Absolute: 59 {cells}/uL (ref 0–200)
Basophils Relative: 1.5 %
Eosinophils Absolute: 160 {cells}/uL (ref 15–500)
Eosinophils Relative: 4.1 %
HCT: 49.1 % (ref 38.5–50.0)
Hemoglobin: 16.2 g/dL (ref 13.2–17.1)
MCH: 29.3 pg (ref 27.0–33.0)
MCHC: 33 g/dL (ref 32.0–36.0)
MCV: 88.9 fL (ref 80.0–100.0)
MPV: 9.3 fL (ref 7.5–12.5)
Monocytes Relative: 10.3 %
Neutro Abs: 597 {cells}/uL — ABNORMAL LOW (ref 1500–7800)
Neutrophils Relative %: 15.3 %
Platelets: 247 Thousand/uL (ref 140–400)
RBC: 5.52 Million/uL (ref 4.20–5.80)
RDW: 13.2 % (ref 11.0–15.0)
Total Lymphocyte: 68.8 %
WBC: 3.9 Thousand/uL (ref 3.8–10.8)

## 2024-07-15 LAB — RPR: RPR Ser Ql: REACTIVE — AB

## 2024-07-15 LAB — HIV-1 RNA QUANT-NO REFLEX-BLD
HIV 1 RNA Quant: NOT DETECTED {copies}/mL
HIV-1 RNA Quant, Log: NOT DETECTED {Log_copies}/mL

## 2024-07-15 LAB — T-HELPER CELLS (CD4) COUNT (NOT AT ARMC)
Absolute CD4: 655.6 {cells}/uL (ref 490–1740)
CD4 T Helper %: 24 % — ABNORMAL LOW (ref 30–61)
Total lymphocyte count: 2770 {cells}/uL (ref 850–3900)

## 2024-07-15 LAB — T PALLIDUM AB: T Pallidum Abs: POSITIVE — AB

## 2024-07-15 LAB — RPR TITER: RPR Titer: 1:1 {titer} — ABNORMAL HIGH

## 2024-07-21 ENCOUNTER — Ambulatory Visit: Payer: Self-pay | Admitting: Student

## 2024-07-23 ENCOUNTER — Telehealth: Payer: Self-pay

## 2024-07-23 NOTE — Telephone Encounter (Signed)
 Wayne Wolfe (Key: [id number]) Ozempic  (2 MG/DOSE) 8MG Wayne Wolfe pen-injectors Form [employer] Electronic Prior Authorization Form (2017 NCPDP) Created 1 day ago Sent to Plan 2 hours ago Plan Response 2 hours ago Submit Clinical Questions 2 hours ago Determination Favorable 10 minutes ago Message from Plan Request Reference Number: [id number]. OZEMPIC  INJ 8MG /3ML is approved through 07/23/2025. For further questions, call [employer] at [phone].[name] Authorization Expiration Date: July 23, 2025.  Lvm for the patient regarding the approval.

## 2024-07-23 NOTE — Telephone Encounter (Signed)
 Prior Authorization for patient (Ozempic  (2 MG/DOSE) 8MG /3ML pen-injectors) came through on cover my meds was submitted with last office notes and labs awaiting approval or denial.  XZB:[id number]

## 2024-07-27 ENCOUNTER — Other Ambulatory Visit (HOSPITAL_COMMUNITY): Payer: Self-pay

## 2024-07-27 ENCOUNTER — Telehealth (HOSPITAL_COMMUNITY): Payer: Self-pay

## 2024-07-27 ENCOUNTER — Ambulatory Visit: Payer: Self-pay | Admitting: Student

## 2024-07-27 VITALS — BP 128/72 | HR 85 | Temp 97.6°F | Ht 66.0 in | Wt 232.8 lb

## 2024-07-27 DIAGNOSIS — F1090 Alcohol use, unspecified, uncomplicated: Secondary | ICD-10-CM | POA: Insufficient documentation

## 2024-07-27 DIAGNOSIS — E1169 Type 2 diabetes mellitus with other specified complication: Secondary | ICD-10-CM

## 2024-07-27 DIAGNOSIS — A539 Syphilis, unspecified: Secondary | ICD-10-CM

## 2024-07-27 DIAGNOSIS — E8889 Other specified metabolic disorders: Secondary | ICD-10-CM

## 2024-07-27 DIAGNOSIS — Z79899 Other long term (current) drug therapy: Secondary | ICD-10-CM

## 2024-07-27 DIAGNOSIS — Z794 Long term (current) use of insulin: Secondary | ICD-10-CM

## 2024-07-27 DIAGNOSIS — B20 Human immunodeficiency virus [HIV] disease: Secondary | ICD-10-CM

## 2024-07-27 LAB — POCT GLYCOSYLATED HEMOGLOBIN (HGB A1C): HbA1c, POC (controlled diabetic range): 7.1 % — AB (ref 0.0–7.0)

## 2024-07-27 LAB — GLUCOSE, CAPILLARY: Glucose-Capillary: 181 mg/dL — ABNORMAL HIGH (ref 70–99)

## 2024-07-27 MED ORDER — METFORMIN HCL ER 500 MG PO TB24
1000.0000 mg | ORAL_TABLET | Freq: Two times a day (BID) | ORAL | 2 refills | Status: AC
  Filled 2024-07-27 – 2024-08-06 (×2): qty 120, 30d supply, fill #0
  Filled 2024-09-15 (×2): qty 120, 30d supply, fill #1

## 2024-07-27 MED ORDER — SEMAGLUTIDE(0.25 OR 0.5MG/DOS) 2 MG/3ML ~~LOC~~ SOPN
0.2500 mg | PEN_INJECTOR | SUBCUTANEOUS | 3 refills | Status: DC
  Filled 2024-07-27 – 2024-08-03 (×2): qty 3, 28d supply, fill #0

## 2024-07-27 MED ORDER — DEXCOM G7 SENSOR MISC
3 refills | Status: DC
  Filled 2024-07-27: qty 3, 30d supply, fill #0

## 2024-07-27 NOTE — Assessment & Plan Note (Signed)
 Improved titers, follows with infectious disease

## 2024-07-27 NOTE — Assessment & Plan Note (Signed)
 HIV infection is well controlled with a nondetectable viral load and stable CD4 count. Currently on Biktarvy . - Recent STI testing negative for chlamydia and gonorrhea.

## 2024-07-27 NOTE — Assessment & Plan Note (Signed)
 Managed with a statin, recent cholesterol levels within normal limits.

## 2024-07-27 NOTE — Patient Instructions (Signed)
 Thank you so much for coming to the clinic today!   We are starting you on ozempic , please start at .25mg  weekly and then increase to .5mg  after 4 weeks if it's going well.  All your labs are looking great! We will not be making any other changes.   If you have any questions please feel free to the call the clinic at anytime at [phone]. It was a pleasure seeing you!  Best, Dr. Halla Chopp

## 2024-07-27 NOTE — Assessment & Plan Note (Addendum)
 Significant improvement in glycemic control with A1c reduced from 10.4% to 7.1%. Neuropathy symptoms present, particularly in the left leg. - Restart Ozempic  for weight loss, diabetes control, and renal and cardiac protection. - Refill metformin  prescription. - Provide glucose meter for home blood glucose monitoring. - Prescribed gabapentin  by infectious disease, he has yet to pick this prescription up. - Checking urine microalbumin

## 2024-07-27 NOTE — Progress Notes (Signed)
 CC: Chronic condition management  HPI:  [name] is a 58 y.o. male living with a history stated below and presents today for chronic condition management. Please see problem based assessment and plan for additional details.  Discussed the use of AI scribe software for clinical note transcription with the patient, who gave verbal consent to proceed.  History of Present Illness Wayne Wolfe is a 58 year old male with diabetes who presents for a follow-up on his diabetes management.  He manages his diabetes with Lantus  at 34 units and metformin  at 1000 mg twice daily. He previously used Ozempic  but discontinued it last year without side effects. He is not taking Jardiance  due to yeast infections. His A1c was 10.4% at his last visit and is 7.1% currently. He does not have a glucose meter at home to monitor his blood sugars.  He experiences neuropathy, particularly in his left leg, described as 'real black burning hurts.' He recalls undergoing an MRI in the past and was informed of arthritis. He previously attended physical therapy. He is not currently taking gabapentin .  He is on lisinopril  for blood pressure management and a statin for cholesterol. He takes Biktarvy  for HIV management, and his HIV viral load is nondetectable with stable CD4 counts. He has been intermittently treated for syphilis.     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   Hypertension 2009   Well controlled on HCTZ   Hypokalemia 01/12/2019   Knee effusion, right 07/19/2017   Lobar pneumonia 01/12/2019    Obesity 10/19/2020   Obstructive sleep apnea 10/19/2020   Osteoarthritis 10/16/2023   Otitis media 03/13/2017   Polyuria 03/13/2017   Poorly controlled diabetes mellitus (HCC) 03/13/2017   Type 2 diabetes mellitus (HCC)    Well controlled on metformin    Urine abnormality 07/04/2023   Vaccine counseling 07/04/2023   Weight loss 03/13/2017    Current Outpatient Medications on File Prior to Visit  Medication Sig Dispense Refill   Accu-Chek FastClix Lancets MISC Check blood sugar before meals and after meals 102 each 12   acetaminophen  (TYLENOL ) 500 MG tablet Take 1,000 mg by mouth every 6 (six) hours as needed for fever.     bictegravir-emtricitabine -tenofovir  AF (BIKTARVY ) 50-200-25 MG TABS tablet Take 1 tablet by mouth daily. 90 tablet 3   Blood Glucose Monitoring Suppl (ACCU-CHEK GUIDE) w/Device KIT 1 each by Does not apply route 6 (six) times daily. 1 kit 0   cyclobenzaprine  (FLEXERIL ) 10 MG tablet Take 1 tablet (10 mg total) by mouth 3 (three) times daily as needed for muscle spasms. 30 tablet 1   diclofenac  Sodium (VOLTAREN ) 1 % GEL Apply 4 grams topically 4 (four) times daily as needed 100 g 3   doxycycline  (VIBRA -TABS) 100 MG tablet Take 1 tablet (100 mg total) by mouth 2 (two) times daily. 14 tablet 0   empagliflozin  (JARDIANCE ) 10 MG TABS tablet Take 1 tablet (10 mg total) by mouth daily before breakfast. 90 tablet 0   fexofenadine  (ALLEGRA ) 180 MG tablet TAKE 1 TABLET(180 MG) BY MOUTH DAILY 30  tablet 5   gabapentin  (NEURONTIN ) 300 MG capsule Take 1 capsule (300 mg total) by mouth 3 (three) times daily. 90 capsule 3   glucose blood (ACCU-CHEK GUIDE) test strip Check blood sugar before meals and after meals 100 each 12   insulin  glargine (LANTUS  SOLOSTAR) 100 UNIT/ML Solostar Pen Inject 30 Units into the skin daily. 15 mL 3   Insulin  Pen Needle (PEN NEEDLES) 32G X 4 MM MISC Use daily as directed. 200 each 1   lisinopril  (ZESTRIL ) 20 MG tablet Take 1 tablet (20 mg total) by mouth  daily. 90 tablet 3   methocarbamol  (ROBAXIN ) 750 MG tablet TAKE 2 TABLETS(1500 MG) BY MOUTH FOUR TIMES DAILY FOR 3 DAYS THEN TAKE 2 TABLETS(1500 MG) BY MOUTH THREE TIMES DAILY FOR 4 DAYS 48 tablet 0   rosuvastatin  (CRESTOR ) 20 MG tablet TAKE 1 TABLET(20 MG) BY MOUTH DAILY 90 tablet 3   No current facility-administered medications on file prior to visit.    Family History  Problem Relation Age of Onset   Diabetes Mellitus II Sister    Colon polyps Sister    Diabetes Mellitus II Mother    Coronary artery disease Mother 15       CABGx6   Coronary artery disease Sister 61       CABG   Diabetes Mellitus II Sister    Coronary artery disease Sister    Diabetes Mellitus II Sister    Colon cancer Neg Hx    Esophageal cancer Neg Hx    Rectal cancer Neg Hx    Stomach cancer Neg Hx     Social History   Socioeconomic History   Marital status: Single    Spouse name: Not on file   Number of children: Not on file   Years of education: 13   Highest education level: Not on file  Occupational History    Employer: UNEMPLOYED  Tobacco Use   Smoking status: Former    Current packs/day: 0.00    Types: Cigarettes    Quit date: 2003    Years since quitting: 22.8   Smokeless tobacco: Never  Vaping Use   Vaping status: Never Used  Substance and Sexual Activity   Alcohol use: No    Alcohol/week: 0.0 standard drinks of alcohol    Comment: Last used 2004   Drug use: No    Comment: Last used 2003   Sexual activity: Yes    Partners: Male  Other Topics Concern   Not on file  Social History Narrative   On disability. Lives in [place].    Social Drivers of Health   Financial Resource Strain: Medium Risk (11/20/2023)   Overall Financial Resource Strain (CARDIA)    Difficulty of Paying Living Expenses: Somewhat hard  Food Insecurity: Low Risk  (02/05/2024)   Received from [facility]   Hunger Vital Sign    Within the past 12 months, you worried that your food would run out before you  got money to buy more: Never true    Within the past 12 months, the food you bought just didn't last and you didn't have money to get more. : Never true  Recent Concern: Food Insecurity - Food Insecurity Present (11/20/2023)   Hunger Vital Sign    Worried About Running Out of Food in the Last Year: Sometimes true    Ran Out of Food in the Last Year: Sometimes true  Transportation Needs: No Transportation Needs (02/05/2024)   Received from [facility]  In the past 12 months, has lack of reliable transportation kept you from medical appointments, meetings, work or from getting things needed for daily living? : No  Physical Activity: Inactive (11/20/2023)   Exercise Vital Sign    Days of Exercise per Week: 0 days    Minutes of Exercise per Session: 0 min  Stress: Stress Concern Present (11/20/2023)   [employer] of Occupational Health - Occupational Stress Questionnaire    Feeling of Stress : To some extent  Social Connections: Moderately Integrated (11/20/2023)   Social Connection and Isolation Panel    Frequency of Communication with Friends and Family: More than three times a week    Frequency of Social Gatherings with Friends and Family: Three times a week    Attends Religious Services: More than 4 times per year    Active Member of Clubs or Organizations: Yes    Attends Banker Meetings: 1 to 4 times per year    Marital Status: Never married  Intimate Partner Violence: Not At Risk (11/20/2023)   Humiliation, Afraid, Rape, and Kick questionnaire    Fear of Current or Ex-Partner: No    Emotionally Abused: No    Physically Abused: No    Sexually Abused: No    Review of Systems: ROS negative except for what is noted on the assessment and plan.  Vitals:   07/27/24 0816  BP: 128/72  Pulse: 85  Temp: 97.6 F (36.4 C)  TempSrc: Oral  SpO2: 95%  Weight: 232 lb 12.8 oz (105.6 kg)  Height: 5' 6 (1.676 m)    Physical Exam: Constitutional:  well-appearing male in no acute distress  Cardiovascular: regular rate and rhythm, no m/r/g Pulmonary/Chest: normal work of breathing on room air, lungs clear to auscultation bilaterally Abdominal: soft, non-tender, non-distended MSK: normal bulk and tone   Assessment & Plan:   Type 2 diabetes mellitus with other specified complication (HCC) Significant improvement in glycemic control with A1c reduced from 10.4% to 7.1%. Neuropathy symptoms present, particularly in the left leg. - Restart Ozempic  for weight loss, diabetes control, and renal and cardiac protection. - Refill metformin  prescription. - Provide glucose meter for home blood glucose monitoring. - Prescribed gabapentin  by infectious disease, he has yet to pick this prescription up. - Checking urine microalbumin  HIV disease (HCC) HIV infection is well controlled with a nondetectable viral load and stable CD4 count. Currently on Biktarvy . - Recent STI testing negative for chlamydia and gonorrhea.  At Risk For Metabolic Associated Liver Disease Patient with obesity, type 2 diabetes, and hyperlipidemia puts him at risk for metabolic associated liver disease. Fib-4 score is .83, indicating low risk.         Syphilis Improved titers, follows with infectious disease  Hyperlipidemia associated with type 2 diabetes mellitus (HCC) Managed with a statin, recent cholesterol levels within normal limits.  Patient discussed with Dr. Trudy Dirks Marcine Gadway, M.D. [facility] Internal Medicine, PGY-3 Pager: [phone] Date 07/27/2024 Time 9:42 AM

## 2024-07-27 NOTE — Assessment & Plan Note (Addendum)
 Patient with obesity, type 2 diabetes, and hyperlipidemia puts him at risk for metabolic associated liver disease. Fib-4 score is .83, indicating low risk.

## 2024-07-28 LAB — MICROALBUMIN / CREATININE URINE RATIO
Creatinine, Urine: 100.5 mg/dL
Microalb/Creat Ratio: 12 mg/g{creat} (ref 0–29)
Microalbumin, Urine: 12.3 ug/mL

## 2024-07-30 ENCOUNTER — Ambulatory Visit: Payer: Self-pay | Admitting: Student

## 2024-08-03 ENCOUNTER — Other Ambulatory Visit (HOSPITAL_COMMUNITY): Payer: Self-pay

## 2024-08-03 NOTE — Telephone Encounter (Signed)
 PA request has been Received. New Encounter has been or will be created for follow up. For additional info see Pharmacy Prior Auth telephone encounter from 08/03/24.

## 2024-08-03 NOTE — Telephone Encounter (Signed)
 Unfortunately it looks like the prior auth might have been done for the 2 mg strength but I see in the chart that patient is on the 0.25/0.5 dose. Can you confirm this? If he is on the 0.25/0.5 dose please let me know and I will submit the prior auth for that one.

## 2024-08-03 NOTE — Telephone Encounter (Signed)
 PA request has been Submitted. New Encounter has been or will be created for follow up. For additional info see Pharmacy Prior Auth telephone encounter from 07/23/24.

## 2024-08-04 NOTE — Telephone Encounter (Signed)
 Hi Wayne Wolfe, I submitted the prior authorization on 10/23 looking in the patients chart the medication was discontinued on 10/27. I can submit a prior authorization for the 0.25 mg dose.

## 2024-08-05 NOTE — Progress Notes (Signed)
 Internal Medicine Clinic Attending  Case discussed with the resident at the time of the visit.  We reviewed the resident's history and exam and pertinent patient test results.  I agree with the assessment, diagnosis, and plan of care documented in the resident's note.

## 2024-08-06 ENCOUNTER — Other Ambulatory Visit: Payer: Self-pay | Admitting: Student

## 2024-08-06 ENCOUNTER — Other Ambulatory Visit (HOSPITAL_COMMUNITY): Payer: Self-pay

## 2024-08-06 DIAGNOSIS — M25561 Pain in right knee: Secondary | ICD-10-CM

## 2024-08-07 ENCOUNTER — Telehealth: Payer: Self-pay

## 2024-08-07 NOTE — Telephone Encounter (Signed)
 Prior Authorization for patient (Ozempic  (0.25 or 0.5 MG/DOSE) 2MG /3ML pen-injectors) came through on cover my meds was submitted with last office notes and labs awaiting approval or denial.  KEY:[id number]

## 2024-08-10 ENCOUNTER — Other Ambulatory Visit (HOSPITAL_COMMUNITY): Payer: Self-pay

## 2024-08-10 MED ORDER — DICLOFENAC SODIUM 1 % EX GEL
4.0000 g | Freq: Four times a day (QID) | CUTANEOUS | 3 refills | Status: DC
  Filled 2024-08-10: qty 100, 7d supply, fill #0

## 2024-08-10 NOTE — Telephone Encounter (Signed)
 Wayne Wolfe (Key: [id number]) Ozempic  (0.25 or 0.5 MG/DOSE) 2MG /3ML pen-injectors Form [employer] Electronic Prior Authorization Form (2017 NCPDP) Created 3 days ago Sent to Plan 3 days ago Plan Response 3 days ago Submit Clinical Questions 3 days ago Determination 3 days ago Message from Plan We received a prior authorization request for the member and product listed above. The [facility] Prior Authorization Team is not able to review this request because the requested product has been previously approved under [id number]. Based on the information reviewed, the requested prescription is currently authorized for coverage by the plan until 07/23/2025. Please resubmit this request within 30 days of authorization expiration date. Please note the member must use their primary insurance plan first. [employer] is the electronic data systems secondary insurance if the member also has an alternate [employer]. For assistance in processing this claim, the pharmacy may contact the pharmacy help desk at [phone].   I called the patient to see if he was able to pick up the medication. I was not able to reach the patient.

## 2024-08-26 ENCOUNTER — Ambulatory Visit

## 2024-09-11 ENCOUNTER — Other Ambulatory Visit: Payer: Self-pay

## 2024-09-11 ENCOUNTER — Encounter: Admitting: *Deleted

## 2024-09-11 VITALS — BP 134/83 | HR 83 | Temp 98.7°F | Ht 66.22 in | Wt 233.9 lb

## 2024-09-11 DIAGNOSIS — Z006 Encounter for examination for normal comparison and control in clinical research program: Secondary | ICD-10-CM

## 2024-09-11 NOTE — Research (Signed)
 Bran seen for screening visit for [phone] [facility]) study: Phase 2, multicenter, randomized, double-blinded, placebo-controlled trial to evaluate safety, tolerability and efficacy of dasatinib and quercetin together in improving physical function measurements in people with HIV who are frail or prefrail. After verifying the correct consent version, I explained/reviewed the informed consent with him. Risk, benefits, responsibilities and other options were discussed. I answered his questions. He verbalized understanding and signed the consent witnessed by me.  Signed informed consent was obtained prior to any procedures being done. All study procedures completed per protocol. Will schedule entry visit once eligibility confirmed.

## 2024-09-15 ENCOUNTER — Ambulatory Visit (INDEPENDENT_AMBULATORY_CARE_PROVIDER_SITE_OTHER): Payer: Self-pay

## 2024-09-15 ENCOUNTER — Other Ambulatory Visit: Payer: Self-pay

## 2024-09-15 ENCOUNTER — Other Ambulatory Visit (HOSPITAL_COMMUNITY): Payer: Self-pay

## 2024-09-15 VITALS — BP 135/91 | HR 111 | Temp 100.2°F | Ht 66.0 in | Wt 229.0 lb

## 2024-09-15 DIAGNOSIS — J069 Acute upper respiratory infection, unspecified: Secondary | ICD-10-CM

## 2024-09-15 LAB — CBC WITH DIFFERENTIAL/PLATELET
Absolute Lymphocytes: 1732 {cells}/uL (ref 850–3900)
Absolute Monocytes: 432 {cells}/uL (ref 200–950)
Basophils Absolute: 60 {cells}/uL (ref 0–200)
Basophils Relative: 1.5 %
Eosinophils Absolute: 132 {cells}/uL (ref 15–500)
Eosinophils Relative: 3.3 %
HCT: 52.4 % — ABNORMAL HIGH (ref 39.4–51.1)
Hemoglobin: 16.9 g/dL (ref 13.2–17.1)
MCH: 28.9 pg (ref 27.0–33.0)
MCHC: 32.3 g/dL (ref 31.6–35.4)
MCV: 89.7 fL (ref 81.4–101.7)
MPV: 9.7 fL (ref 7.5–12.5)
Monocytes Relative: 10.8 %
Neutro Abs: 1644 {cells}/uL (ref 1500–7800)
Neutrophils Relative %: 41.1 %
Platelets: 225 Thousand/uL (ref 140–400)
RBC: 5.84 Million/uL — ABNORMAL HIGH (ref 4.20–5.80)
RDW: 13.8 % (ref 11.0–15.0)
Total Lymphocyte: 43.3 %
WBC: 4 Thousand/uL (ref 3.8–10.8)

## 2024-09-15 LAB — COMPREHENSIVE METABOLIC PANEL WITH GFR
AG Ratio: 1.7 (calc) (ref 1.0–2.5)
ALT: 13 U/L (ref 9–46)
AST: 14 U/L (ref 10–35)
Albumin: 4.3 g/dL (ref 3.6–5.1)
Alkaline phosphatase (APISO): 53 U/L (ref 35–144)
BUN: 9 mg/dL (ref 7–25)
CO2: 28 mmol/L (ref 20–32)
Calcium: 9 mg/dL (ref 8.6–10.3)
Chloride: 101 mmol/L (ref 98–110)
Creat: 1.15 mg/dL (ref 0.70–1.35)
Globulin: 2.6 g/dL (ref 1.9–3.7)
Glucose, Bld: 81 mg/dL (ref 65–99)
Potassium: 4.4 mmol/L (ref 3.5–5.3)
Sodium: 138 mmol/L (ref 135–146)
Total Bilirubin: 0.6 mg/dL (ref 0.2–1.2)
Total Protein: 6.9 g/dL (ref 6.1–8.1)
eGFR: 73 mL/min/1.73m2 (ref >=60)

## 2024-09-15 LAB — TEST AUTHORIZATION

## 2024-09-15 LAB — PROTIME-INR
INR: 1.1
Prothrombin Time: 12 s — ABNORMAL HIGH (ref 9.0–11.5)

## 2024-09-15 LAB — HIV-1 RNA QUANT-NO REFLEX-BLD
HIV 1 RNA Quant: NOT DETECTED {copies}/mL
HIV-1 RNA Quant, Log: NOT DETECTED {Log_copies}/mL

## 2024-09-15 LAB — MAGNESIUM: Magnesium: 1.9 mg/dL (ref 1.5–2.5)

## 2024-09-15 LAB — TSH: TSH: 0.56 m[IU]/L (ref 0.40–4.50)

## 2024-09-15 LAB — PHOSPHORUS: Phosphorus: 3.7 mg/dL (ref 2.5–4.5)

## 2024-09-15 LAB — HEPATITIS C ANTIBODY: Hepatitis C Ab: NONREACTIVE

## 2024-09-15 LAB — HEMOGLOBIN A1C
Hgb A1c MFr Bld: 7.1 % — ABNORMAL HIGH (ref <5.7)
Mean Plasma Glucose: 157 mg/dL
eAG (mmol/L): 8.7 mmol/L

## 2024-09-15 LAB — HEPATITIS B SURFACE ANTIGEN: Hepatitis B Surface Ag: NONREACTIVE

## 2024-09-15 MED ORDER — OSELTAMIVIR PHOSPHATE 75 MG PO CAPS
75.0000 mg | ORAL_CAPSULE | Freq: Two times a day (BID) | ORAL | 0 refills | Status: DC
  Filled 2024-09-16: qty 10, 5d supply, fill #0

## 2024-09-15 NOTE — Patient Instructions (Addendum)
 Thank you, [name], for allowing us  to provide your care today. Today we discussed . . .  > Respiratory infection       - You are seen for an upper respiratory infection.  Likely that your symptoms will get better on their own.  I did send some Tamiflu  to your pharmacy.  I will call you with the results of the swab that we took.   I have ordered the following labs for you:  Lab Orders  No laboratory test(s) ordered today      Referrals ordered today:   Referral Orders  No referral(s) requested today      Follow up: 1 month for your other medical conditions   Remember:  Should you have any questions or concerns please call the internal medicine clinic at [phone].     Melvenia Morrison, [facility]

## 2024-09-15 NOTE — Progress Notes (Signed)
 CC: Acute Concern of cough, congestion, and myalgias  HPI:  Wayne Wolfe is a 58 y.o. male with pertinent PMH of DMII, HLD, HTN,  who presents for cough, congestion, and myalgias. Please see problem based assessment and plan for further history.  ROS  Medications: Current Outpatient Medications  Medication Instructions   Accu-Chek FastClix Lancets MISC Check blood sugar before meals and after meals   acetaminophen  (TYLENOL ) 1,000 mg, Every 6 hours PRN   bictegravir-emtricitabine -tenofovir  AF (BIKTARVY ) 50-200-25 MG TABS tablet 1 tablet, Oral, Daily   Blood Glucose Monitoring Suppl (ACCU-CHEK GUIDE) w/Device KIT 1 each, Does not apply, 6 times daily   Continuous Glucose Sensor (DEXCOM G7 SENSOR) MISC Change sensor every 10 days or as directed by your provider (for diabetes).   cyclobenzaprine  (FLEXERIL ) 10 mg, Oral, 3 times daily PRN   diclofenac  Sodium (VOLTAREN ) 1 % GEL Apply 4 grams topically 4 (four) times daily as needed   doxycycline  (VIBRA -TABS) 100 mg, Oral, 2 times daily   fexofenadine  (ALLEGRA ) 180 MG tablet TAKE 1 TABLET(180 MG) BY MOUTH DAILY   gabapentin  (NEURONTIN ) 300 mg, Oral, 3 times daily   glucose blood (ACCU-CHEK GUIDE) test strip Check blood sugar before meals and after meals   Insulin  Pen Needle (PEN NEEDLES) 32G X 4 MM MISC Use daily as directed.   Jardiance  10 mg, Oral, Daily before breakfast   Lantus  SoloStar 30 Units, Subcutaneous, Daily   lisinopril  (ZESTRIL ) 20 mg, Oral, Daily   metFORMIN  (GLUCOPHAGE -XR) 1,000 mg, Oral, 2 times daily with meals   methocarbamol  (ROBAXIN ) 750 MG tablet TAKE 2 TABLETS(1500 MG) BY MOUTH FOUR TIMES DAILY FOR 3 DAYS THEN TAKE 2 TABLETS(1500 MG) BY MOUTH THREE TIMES DAILY FOR 4 DAYS   oseltamivir  (TAMIFLU ) 75 mg, Oral, 2 times daily   rosuvastatin  (CRESTOR ) 20 MG tablet TAKE 1 TABLET(20 MG) BY MOUTH DAILY   Semaglutide (0.25 or 0.5MG /DOS) 0.25 mg, Subcutaneous, Weekly     Physical Exam:  Vitals:   09/15/24 1431  BP: (!)  135/91  Pulse: (!) 111  Temp: 100.2 F (37.9 C)  TempSrc: Oral  SpO2: 97%  Weight: 229 lb (103.9 kg)  Height: 5' 6 (1.676 m)    Physical Exam Constitutional:      General: He is not in acute distress.    Appearance: He is not ill-appearing.  HENT:     Nose: Congestion present.     Mouth/Throat:     Pharynx: Posterior oropharyngeal erythema present.     Tonsils: No tonsillar exudate.  Cardiovascular:     Rate and Rhythm: Tachycardia present.  Pulmonary:     Comments: Rales on the left lung.  Clear to auscultation right lung Abdominal:     General: There is no distension.     Tenderness: There is no abdominal tenderness.  Neurological:     Mental Status: He is alert.       Assessment & Plan:   Assessment & Plan Upper respiratory tract infection, unspecified type Patient presents with acute concern of congestion, sore throat, cough, diarrhea, myalgias, and elevated temperature.  The symptoms started Saturday.  He has a sick contact on Thursday.  Notes have stayed about the same since onset.  His cough is mildly productive of phlegm.  His myalgias are generalized throughout his whole body.  He has not checked his temperature but his temperature here is 100.2.  He was tachycardic today.  On physical exam he had rales in his left lung along with congestion.  There was  erythema of his oropharynx.  Symptoms are consistent with a viral URI.  Patient was offered supportive measures such as Tylenol , herbal teas, and other symptomatic management.  After shared decision making we elected to test for flu, COVID, and RSV.  Given his myalgias and his lung symptoms, we also elected to treat empirically with Tamiflu .  He is right at the edge of 72 hours of symptoms so this may not be helpful for him.  We discussed this but the patient requested to be prescribed this medication in the past.  He is vaccinated for both flu and COVID  -Tamiflu  twice daily for 5 days - Follow-up if symptoms are  failing to improve - Follow-up for other chronic conditions in 1-2 months  Orders Placed This Encounter  Procedures   COVID-19, Flu A+B and RSV    Standing Status:   Future    Number of Occurrences:   1    Expected Date:   09/15/2024    Expiration Date:   09/22/2024    Previously tested for COVID-19:   Yes    Resident in a congregate (group) care setting:   No    Is the patient student?:   No    Employed in healthcare setting:   No    Has patient completed COVID vaccination(s) (2 doses of Pfizer/Moderna 1 dose of [employer]):   Yes    Has patient completed COVID Booster / 3rd dose:   Yes    Patient seen with Dr. Mliss Trudy Melvenia Napoleon, MD Internal Medicine Center Internal Medicine Resident PGY-1 Clinic Phone: [phone] Please contact the on call pager at [phone] for any urgent or emergent needs.

## 2024-09-16 ENCOUNTER — Telehealth: Payer: Self-pay | Admitting: *Deleted

## 2024-09-16 ENCOUNTER — Other Ambulatory Visit (HOSPITAL_COMMUNITY): Payer: Self-pay

## 2024-09-16 NOTE — Telephone Encounter (Signed)
 Call to [facility] to transfer Tamiflu  prescription from the [facility]. Spoke with Pharmacist to have medication transferred from the [facility] to the [facility].  Call to patient message was left that the Tamiflu  prescription is being transferred and may take a couple hours before he will be able to pick up th prescription.

## 2024-09-17 ENCOUNTER — Other Ambulatory Visit (HOSPITAL_COMMUNITY): Payer: Self-pay

## 2024-09-17 ENCOUNTER — Ambulatory Visit: Payer: Self-pay

## 2024-09-17 LAB — COVID-19, FLU A+B AND RSV
Influenza A, NAA: NOT DETECTED
Influenza B, NAA: NOT DETECTED
RSV, NAA: DETECTED — AB
SARS-CoV-2, NAA: NOT DETECTED

## 2024-09-17 MED ORDER — PANCRELIPASE (LIP-PROT-AMYL) 36000-114000 UNITS PO CPEP
ORAL_CAPSULE | ORAL | 6 refills | Status: AC
  Filled 2024-09-17: qty 300, 30d supply, fill #0

## 2024-09-17 NOTE — Telephone Encounter (Signed)
 FYI Only or Action Required?: FYI only for provider: Health info only, no triage.  Patient was last seen in primary care on 09/15/2024 by Napoleon Limes, MD.  Called Nurse Triage reporting Advice Only.  Symptoms began several days ago.  Interventions attempted: OTC medications: Tylenol , ibuprofen and Prescription medications: Tamiflu , stopped after being told it was RSV and not flu.  Symptoms are: gradually improving.  Triage Disposition: Information or Advice Only Call  Patient/caregiver understands and will follow disposition?: Yes   Copied from CRM #[id number]. Topic: Clinical - Medication Question >> Sep 17, 2024  4:07 PM DeAngela L wrote: Reason for CRM: patient was just told he has RSV and he has additional questions   Pt num  [phone] Reason for Disposition  Health information question, no triage required and triager able to answer question  Answer Assessment - Initial Assessment Questions 1. REASON FOR CALL: What is the main reason for your call? or How can I best help you?     Pt states his doctor told him he has RSV. Pt calling to clarify what RSV stands for. Explained is stands for Respiratory Syncytial Virus.  2. SYMPTOMS : Do you have any symptoms?      Runny nose, cough, sneezing, low grade temp 100.2 F, improving. Ongoing since Saturday.  3. OTHER QUESTIONS: Do you have any other questions?     Denies.  Protocols used: Information Only Call - No Triage-A-AH

## 2024-09-18 ENCOUNTER — Ambulatory Visit: Payer: Self-pay

## 2024-09-18 ENCOUNTER — Telehealth: Payer: Self-pay | Admitting: *Deleted

## 2024-09-18 NOTE — Telephone Encounter (Signed)
 Return pt's call - no answer; left message of office's return call.

## 2024-09-18 NOTE — Telephone Encounter (Signed)
 Copied from CRM #[id number]. Topic: Clinical - Medical Advice >> Sep 18, 2024 10:26 AM DeAngela L wrote: Reason for CRM: patient calling to speak with the nurse about his RSV diagnosis   Pt num  [phone]   Pt spoke with NT about RSV health concerns yesterday but he wants to ask additional questions and concerns

## 2024-09-18 NOTE — Progress Notes (Signed)
Internal Medicine Clinic Attending  I was physically present during the key portions of the resident provided service and participated in the medical decision making of patient's management care. I reviewed pertinent patient test results.  The assessment, diagnosis, and plan were formulated together and I agree with the documentation in the resident's note.  Williams, Julie Anne, MD  

## 2024-09-21 ENCOUNTER — Other Ambulatory Visit (HOSPITAL_COMMUNITY): Payer: Self-pay

## 2024-09-21 ENCOUNTER — Encounter: Payer: Self-pay | Admitting: Student

## 2024-09-21 ENCOUNTER — Other Ambulatory Visit: Payer: Self-pay

## 2024-09-21 ENCOUNTER — Ambulatory Visit: Admitting: Student

## 2024-09-21 VITALS — BP 127/88 | HR 94 | Temp 98.1°F | Ht 66.0 in | Wt 230.6 lb

## 2024-09-21 DIAGNOSIS — Z7984 Long term (current) use of oral hypoglycemic drugs: Secondary | ICD-10-CM | POA: Diagnosis not present

## 2024-09-21 DIAGNOSIS — J069 Acute upper respiratory infection, unspecified: Secondary | ICD-10-CM

## 2024-09-21 DIAGNOSIS — I152 Hypertension secondary to endocrine disorders: Secondary | ICD-10-CM

## 2024-09-21 DIAGNOSIS — B974 Respiratory syncytial virus as the cause of diseases classified elsewhere: Secondary | ICD-10-CM | POA: Diagnosis not present

## 2024-09-21 DIAGNOSIS — Z794 Long term (current) use of insulin: Secondary | ICD-10-CM | POA: Diagnosis not present

## 2024-09-21 DIAGNOSIS — K625 Hemorrhage of anus and rectum: Secondary | ICD-10-CM

## 2024-09-21 DIAGNOSIS — E1169 Type 2 diabetes mellitus with other specified complication: Secondary | ICD-10-CM

## 2024-09-21 DIAGNOSIS — E1159 Type 2 diabetes mellitus with other circulatory complications: Secondary | ICD-10-CM

## 2024-09-21 NOTE — Progress Notes (Addendum)
 " Patient name: Wayne Wolfe Date of birth: 10-24-1965 Date of visit: 09/23/2024  Subjective  Reason for visit: Follow-up ( Check up for return to work after wk  taken out of work  / Pt reports feeling better  but stated that now both his ears  feel like they are blocked )  Discussed the use of AI scribe software for clinical note transcription with the patient, who gave verbal consent to proceed.  History of Present Illness   Wayne Wolfe is a 58 year old male who presents for clearance to return to work after recovering from RSV.  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 and expresses concern about working while still mildly symptomatic  Otolaryngologic symptoms - Ear fullness with a 'stopped up' sensation - Subjectively fainter hearing in the right ear without significant hearing loss - Uncertain if symptoms are due to congestion or cerumen impaction  Gastrointestinal symptoms - Intermittent dysphagia with sensation of food getting stuck - Occasional red or dark stools - History of prior polyps - Unintentional weight loss of approximately 24 pounds over the past few years  Diabetes mellitus and neuropathy - Diabetes managed with Lantus  insulin  - Monitors blood glucose with glucometer (not continuous monitor) - 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   bictegravir-emtricitabine -tenofovir  AF (BIKTARVY ) 50-200-25 MG TABS  tablet Take 1 tablet by mouth daily.   Blood Glucose Monitoring Suppl (ACCU-CHEK GUIDE) w/Device KIT 1 each by Does not apply route 6 (six) times daily.   cyclobenzaprine  (FLEXERIL ) 10 MG tablet Take 1 tablet (10 mg total) by mouth 3 (three) times daily as needed for muscle spasms.   diclofenac  Sodium (VOLTAREN ) 1 % GEL Apply 4 grams topically 4 (four) times daily as needed   gabapentin  (NEURONTIN ) 300 MG capsule Take 1 capsule (300 mg total) by mouth 3 (three) times daily.   glucose blood (ACCU-CHEK GUIDE) test strip Check blood sugar before meals and after meals   insulin  glargine (LANTUS  SOLOSTAR) 100 UNIT/ML Solostar Pen Inject 30 Units into the skin daily.   Insulin  Pen Needle (PEN NEEDLES) 32G X 4 MM MISC Use daily as directed.   lipase/protease/amylase (CREON ) 36000 UNITS CPEP capsule Take 2 capsules (72,000 Units total) by mouth 3 (three) times daily with meals AND 2 capsules (72,000 Units total) with snacks.   lisinopril  (ZESTRIL ) 20 MG tablet Take 1 tablet (20 mg total) by mouth daily.   metFORMIN  (GLUCOPHAGE -XR) 500 MG 24 hr tablet Take 2 tablets (1,000 mg total) by mouth 2 (two) times daily with a meal.   rosuvastatin  (CRESTOR ) 20 MG tablet TAKE 1 TABLET(20 MG) BY MOUTH DAILY   [DISCONTINUED] Continuous Glucose Sensor (DEXCOM G7 SENSOR) MISC Change sensor every 10 days or as directed by your provider (for diabetes). (Patient not taking: Reported on 09/11/2024)   [DISCONTINUED] doxycycline  (VIBRA -TABS) 100 MG tablet Take 1 tablet (100 mg total) by mouth 2 (two) times daily.   [  DISCONTINUED] empagliflozin  (JARDIANCE ) 10 MG TABS tablet Take 1 tablet (10 mg total) by mouth daily before breakfast. (Patient not taking: Reported on 09/11/2024)   [DISCONTINUED] fexofenadine  (ALLEGRA ) 180 MG tablet TAKE 1 TABLET(180 MG) BY MOUTH DAILY (Patient not taking: Reported on 09/11/2024)   [DISCONTINUED] methocarbamol  (ROBAXIN ) 750 MG tablet TAKE 2 TABLETS(1500 MG) BY MOUTH FOUR TIMES DAILY FOR 3 DAYS THEN  TAKE 2 TABLETS(1500 MG) BY MOUTH THREE TIMES DAILY FOR 4 DAYS (Patient not taking: Reported on 09/11/2024)   [DISCONTINUED] oseltamivir  (TAMIFLU ) 75 MG capsule Take 1 capsule (75 mg total) by mouth 2 (two) times daily for 5 days.   [DISCONTINUED] Semaglutide ,0.25 or 0.5MG /DOS, 2 MG/3ML SOPN Inject 0.25 mg into the skin once a week. (Patient not taking: Reported on 09/11/2024)   No facility-administered medications prior to visit.     Objective  Today's Vitals   09/21/24 1316  BP: 127/88  Pulse: 94  Temp: 98.1 F (36.7 C)  TempSrc: Oral  SpO2: 98%  Weight: 230 lb 9.6 oz (104.6 kg)  Height: 5' 6 (1.676 m)  Body mass index is 37.22 kg/m.   Physical Exam Constitutional:      Appearance: Normal appearance.  HENT:     Right Ear: Tympanic membrane, ear canal and external ear normal.     Left Ear: Tympanic membrane, ear canal and external ear normal.     Ears:     Comments: Hearing is grossly intact bilaterally    Mouth/Throat:     Mouth: Mucous membranes are moist.     Pharynx: Oropharynx is clear. No oropharyngeal exudate or posterior oropharyngeal erythema.  Cardiovascular:     Rate and Rhythm: Normal rate and regular rhythm.     Pulses: Normal pulses.     Heart sounds: No murmur heard. Pulmonary:     Effort: Pulmonary effort is normal. No respiratory distress.     Breath sounds: No wheezing.  Skin:    General: Skin is warm and dry.  Neurological:     Mental Status: He is alert.     Cranial Nerves: No facial asymmetry.  Psychiatric:        Mood and Affect: Affect normal.        Speech: Speech normal.        Behavior: Behavior normal.   Results   Labs CMP: Within normal limits for kidney function HbA1c: Within normal limits for diabetes control  Pathology Anal pathology (April 2021): LGSIL      Assessment & Plan Acute upper respiratory infection due to respiratory syncytial virus (RSV) Improving.  Still with some residual cough, sinus congestion, and now some  ear fullness.  Think all of this is consistent with the natural history of RSV upper respiratory infection.  Continue supportive care.  I think it is okay if he goes back to work and use precautions to help prevent the spread of RSV.    Type 2 diabetes mellitus with other specified complication, with long-term current use of insulin  [facility]) Lab Results  Component Value Date   HGBA1C 7.1 (H) 09/11/2024   HGBA1C 7.1 (A) 07/27/2024   HGBA1C 10.4 (A) 08/20/2023   Chronic with much better glycemic control of late.  On glargine 30 units daily and metformin  ER 1000 mg twice daily.  No hypoglycemia.  Checks blood sugar with intermittent fingerstick.  Continue medications per above for now.  Ophthalmology referral and foot exam done today.  Orders:   Ambulatory referral to Ophthalmology   Hypertension associated with diabetes (HCC)  BP Readings from Last 3 Encounters:  09/21/24 127/88  09/15/24 (!) 135/91  09/11/24 134/83   Chronic, good control, good BP in the office today.  Kidney function looked okay on last chemistry panel.  Continue lisinopril  20 mg daily.     Rectal bleeding Addendum 09/23/2024:  Mr. Yoakum presented the concern about rectal bleeding at the end of our visit. He declined rectal examination. I recommended he return in 2 days for a thorough evaluation of this problem and physical exam. Unfortunately he didn't appear for this scheduled appointment. Called him and left voicemail to reschedule as soon as possible.    Return in 2 days (on 09/23/2024) for other health management issues including rectal complaints and weight loss.  Ozell Kung MD 09/23/2024, 8:53 AM      "

## 2024-09-21 NOTE — Assessment & Plan Note (Addendum)
 Lab Results  Component Value Date   HGBA1C 7.1 (H) 09/11/2024   HGBA1C 7.1 (A) 07/27/2024   HGBA1C 10.4 (A) 08/20/2023   Chronic with much better glycemic control of late.  On glargine 30 units daily and metformin  ER 1000 mg twice daily.  No hypoglycemia.  Checks blood sugar with intermittent fingerstick.  Continue medications per above for now.  Ophthalmology referral and foot exam done today.  Orders:   Ambulatory referral to Ophthalmology

## 2024-09-21 NOTE — Assessment & Plan Note (Addendum)
 BP Readings from Last 3 Encounters:  09/21/24 127/88  09/15/24 (!) 135/91  09/11/24 134/83   Chronic, good control, good BP in the office today.  Kidney function looked okay on last chemistry panel.  Continue lisinopril  20 mg daily.

## 2024-09-21 NOTE — Assessment & Plan Note (Deleted)
 [name]

## 2024-09-21 NOTE — Patient Instructions (Addendum)
 VISIT SUMMARY: You came in today for clearance to return to work after recovering from RSV. We discussed your respiratory symptoms, ear fullness, gastrointestinal issues, and diabetes management. You are cleared to return to work with precautions.  YOUR PLAN: ACUTE RESPIRATORY SYNCYTIAL VIRUS INFECTION: You recently had an RSV infection and still have some congestion and a mild cough. -Continue taking Mucinex  as needed. -Wear a mask and wash your hands frequently to prevent spreading the virus. -You are cleared to return to work with these precautions.  TYPE 2 DIABETES MELLITUS COMPLICATED BY DIABETIC POLYNEUROPATHY: Your diabetes is well-controlled with Lantus , and you have some neuropathy in your feet. -You are referred for an annual diabetes eye exam. -Check your feet daily and wear proper footwear. -Continue using Lantus  for diabetes management.  GENERAL HEALTH MAINTENANCE: We discussed your overall health, including your weight loss, bleeding, and difficulty swallowing. -Return to the clinic Wednesday morning so we can cover these issues in detail. -We may consider a referral for a colonoscopy after the follow-up appointment.  Take your prescription medications as usual on the day of your doctor visit. Unless specifically instructed, there is no need to fast prior to laboratory blood testing.  Bring all of the medications that you take (including over the counter medications and supplements) with you to every clinic visit.  This after visit summary is an important review of tests, referrals, and medication changes that were discussed during your visit. If you have questions or concerns, call [phone]. Outside of clinic business hours, call the main hospital at [phone] and ask the operator for the on-call internal medicine resident.   Ozell Kung MD 09/21/2024, 2:00 PM

## 2024-09-23 ENCOUNTER — Ambulatory Visit: Payer: Self-pay | Admitting: Student

## 2024-09-23 ENCOUNTER — Telehealth: Payer: Self-pay | Admitting: Student

## 2024-09-23 DIAGNOSIS — K625 Hemorrhage of anus and rectum: Secondary | ICD-10-CM

## 2024-09-23 NOTE — Telephone Encounter (Signed)
 Rectal bleeding Mr. Mcmurtry presented the concern about rectal bleeding at the end of our visit. He declined rectal examination. I recommended he return in 2 days for a thorough evaluation of this problem and physical exam. Unfortunately he didn't appear for this scheduled appointment. Called him and left voicemail to reschedule as soon as possible.  Ozell Kung MD 09/23/2024, 8:57 AM

## 2024-09-29 NOTE — Progress Notes (Signed)
 Internal Medicine Clinic Attending  Case discussed with the resident at the time of the visit.  We reviewed the resident's history and exam and pertinent patient test results.  I agree with the assessment, diagnosis, and plan of care documented in the resident's note.

## 2024-10-09 ENCOUNTER — Encounter: Payer: Self-pay | Admitting: *Deleted

## 2024-10-09 ENCOUNTER — Other Ambulatory Visit: Payer: Self-pay

## 2024-10-09 VITALS — BP 116/78 | HR 76 | Temp 98.3°F | Wt 231.7 lb

## 2024-10-09 DIAGNOSIS — Z006 Encounter for examination for normal comparison and control in clinical research program: Secondary | ICD-10-CM

## 2024-10-09 MED ORDER — STUDY - A5426 - QUERCETIN 250 MG OR PLACEBO CAPSULE (PI-VAN DAM)
5.0000 | ORAL_CAPSULE | Freq: Once | ORAL | Status: AC
  Administered 2024-10-09: 1250 mg via ORAL
  Filled 2024-10-09: qty 5

## 2024-10-09 MED ORDER — STUDY - A5426 - QUERCETIN 250 MG OR PLACEBO CAPSULE (PI-VAN DAM): 1250.0000 mg | ORAL_CAPSULE | ORAL | 0 refills | Status: AC

## 2024-10-09 MED ORDER — STUDY - A5426 - DASATINIB 100 MG OR PLACEBO CAPSULE (PI-VAN DAM)
1.0000 | ORAL_CAPSULE | ORAL | Status: AC
  Administered 2024-10-09: 100 mg via ORAL
  Filled 2024-10-09: qty 1

## 2024-10-09 MED ORDER — STUDY - A5426 - DASATINIB 100 MG OR PLACEBO CAPSULE (PI-VAN DAM): 100.0000 mg | ORAL_CAPSULE | ORAL | 0 refills | Status: AC

## 2024-10-09 NOTE — Research (Signed)
 Londell here for his Entry visit for [phone] [facility]) study: Phase 2, multicenter, randomized, double-blinded, placebo-controlled trial to evaluate safety, tolerability and efficacy of dasatinib  and quercetin  together in improving physical function measurements in people with HIV who are frail or prefrail. Eligibility was verified prior to randomization. All study procedures completed per protocol. Study IP was dispensed. He will return in 2 weeks for his next study visit.

## 2024-10-12 ENCOUNTER — Other Ambulatory Visit (HOSPITAL_COMMUNITY): Payer: Self-pay

## 2024-10-13 LAB — CBC WITH DIFFERENTIAL/PLATELET
Absolute Lymphocytes: 2840 {cells}/uL (ref 850–3900)
Absolute Monocytes: 296 {cells}/uL (ref 200–950)
Basophils Absolute: 40 {cells}/uL (ref 0–200)
Basophils Relative: 1 %
Eosinophils Absolute: 160 {cells}/uL (ref 15–500)
Eosinophils Relative: 4 %
HCT: 46.1 % (ref 39.4–51.1)
Hemoglobin: 15 g/dL (ref 13.2–17.1)
MCH: 28.7 pg (ref 27.0–33.0)
MCHC: 32.5 g/dL (ref 31.6–35.4)
MCV: 88.3 fL (ref 81.4–101.7)
MPV: 9.3 fL (ref 7.5–12.5)
Monocytes Relative: 7.4 %
Neutro Abs: 664 {cells}/uL — ABNORMAL LOW (ref 1500–7800)
Neutrophils Relative %: 16.6 %
Platelets: 256 Thousand/uL (ref 140–400)
RBC: 5.22 Million/uL (ref 4.20–5.80)
RDW: 13.9 % (ref 11.0–15.0)
Total Lymphocyte: 71 %
WBC: 4 Thousand/uL (ref 3.8–10.8)

## 2024-10-13 LAB — HIV-1 RNA QUANT-NO REFLEX-BLD
HIV 1 RNA Quant: NOT DETECTED {copies}/mL
HIV-1 RNA Quant, Log: NOT DETECTED {Log_copies}/mL

## 2024-10-13 LAB — COMPREHENSIVE METABOLIC PANEL WITH GFR
AG Ratio: 1.6 (calc) (ref 1.0–2.5)
ALT: 14 U/L (ref 9–46)
AST: 13 U/L (ref 10–35)
Albumin: 4.2 g/dL (ref 3.6–5.1)
Alkaline phosphatase (APISO): 56 U/L (ref 35–144)
BUN: 11 mg/dL (ref 7–25)
CO2: 25 mmol/L (ref 20–32)
Calcium: 8.9 mg/dL (ref 8.6–10.3)
Chloride: 103 mmol/L (ref 98–110)
Creat: 0.96 mg/dL (ref 0.70–1.35)
Globulin: 2.6 g/dL (ref 1.9–3.7)
Glucose, Bld: 105 mg/dL — ABNORMAL HIGH (ref 65–99)
Potassium: 4.2 mmol/L (ref 3.5–5.3)
Sodium: 136 mmol/L (ref 135–146)
Total Bilirubin: 0.4 mg/dL (ref 0.2–1.2)
Total Protein: 6.8 g/dL (ref 6.1–8.1)
eGFR: 90 mL/min/1.73m2

## 2024-10-13 LAB — LYMPH ENUMERATION,HELPER/SUPPRESSOR
%CD8 (Cytotoxic/Suppressor): 55 % — ABNORMAL HIGH (ref 12–42)
Absolute CD4: 777 {cells}/uL (ref 490–1740)
CD4 T Helper %: 25 % — ABNORMAL LOW (ref 30–61)
CD4/CD8 Ratio: 0.45 — ABNORMAL LOW (ref 0.86–5.00)
CD8 T Cell Abs: 1719 {cells}/uL — ABNORMAL HIGH (ref 180–1170)
Total lymphocyte count: 3111 {cells}/uL (ref 850–3900)

## 2024-10-13 LAB — HEMOGLOBIN A1C
Hgb A1c MFr Bld: 7.6 % — ABNORMAL HIGH
Mean Plasma Glucose: 171 mg/dL
eAG (mmol/L): 9.5 mmol/L

## 2024-10-13 LAB — PHOSPHORUS: Phosphorus: 3.8 mg/dL (ref 2.5–4.5)

## 2024-10-13 LAB — MAGNESIUM: Magnesium: 1.9 mg/dL (ref 1.5–2.5)

## 2024-10-14 ENCOUNTER — Ambulatory Visit: Payer: Self-pay

## 2024-10-14 NOTE — Telephone Encounter (Signed)
 Pt's appt is 1/15 with Dr Kandis.

## 2024-10-14 NOTE — Telephone Encounter (Signed)
 FYI Only or Action Required?: FYI only for provider: appointment scheduled on 10/15/24.  Patient was last seen in primary care on 09/21/2024 by Norrine Sharper, MD.  Called Nurse Triage reporting Otalgia.  Symptoms began several weeks ago.  Interventions attempted: OTC medications: Tylenol , Allegra .  Symptoms are: unchanged.  Triage Disposition: See Physician Within 24 Hours  Patient/caregiver understands and will follow disposition?: Yes  Reason for Disposition  Earache  (Exceptions: Brief ear pain of lasting less than 60 minutes, or earache occurring during air travel.)  Answer Assessment - Initial Assessment Questions Pt reports taking tylenol  and allegra .   1. LOCATION: Which ear is involved?     Right ear  2. ONSET: When did the ear pain start?      2 weeks  3. SEVERITY: How bad is the pain?  (Scale 1-10; mild, moderate or severe)     6-7/10  4. URI SYMPTOMS: Do you have a runny nose or cough?     Mild nasal congestion, head feels full  5. FEVER: Do you have a fever? If Yes, ask: What is your temperature, how was it measured, and when did it start?     Denies  6. CAUSE: Have you been swimming recently?, How often do you use Q-TIPS?, Have you had any recent air travel or scuba diving?     Unsure  7. OTHER SYMPTOMS: Do you have any other symptoms? (e.g., decreased hearing, dizziness, headache, stiff neck, vomiting)     Mild decreased hearing  Protocols used: Earache-A-AH  Copied from CRM #[id number]. Topic: Clinical - Red Word Triage >> Oct 14, 2024  3:46 PM Chiquita SQUIBB wrote: Red Word that prompted transfer to Nurse Triage: Patient is calling in stating that he is having sharp severe ear pain and would like to move up his upcoming appointment.

## 2024-10-15 ENCOUNTER — Telehealth: Payer: Self-pay | Admitting: *Deleted

## 2024-10-15 ENCOUNTER — Other Ambulatory Visit: Payer: Self-pay

## 2024-10-15 ENCOUNTER — Other Ambulatory Visit (HOSPITAL_COMMUNITY): Payer: Self-pay

## 2024-10-15 ENCOUNTER — Ambulatory Visit: Admitting: Student

## 2024-10-15 ENCOUNTER — Other Ambulatory Visit: Payer: Self-pay | Admitting: *Deleted

## 2024-10-15 ENCOUNTER — Ambulatory Visit: Payer: Self-pay | Admitting: Student

## 2024-10-15 VITALS — BP 132/81 | HR 80 | Temp 97.7°F | Ht 66.0 in | Wt 231.2 lb

## 2024-10-15 DIAGNOSIS — M25569 Pain in unspecified knee: Secondary | ICD-10-CM

## 2024-10-15 DIAGNOSIS — E1169 Type 2 diabetes mellitus with other specified complication: Secondary | ICD-10-CM

## 2024-10-15 DIAGNOSIS — H60501 Unspecified acute noninfective otitis externa, right ear: Secondary | ICD-10-CM

## 2024-10-15 DIAGNOSIS — E785 Hyperlipidemia, unspecified: Secondary | ICD-10-CM

## 2024-10-15 DIAGNOSIS — B20 Human immunodeficiency virus [HIV] disease: Secondary | ICD-10-CM

## 2024-10-15 DIAGNOSIS — M25561 Pain in right knee: Secondary | ICD-10-CM

## 2024-10-15 DIAGNOSIS — H66001 Acute suppurative otitis media without spontaneous rupture of ear drum, right ear: Secondary | ICD-10-CM | POA: Diagnosis not present

## 2024-10-15 DIAGNOSIS — H6090 Unspecified otitis externa, unspecified ear: Secondary | ICD-10-CM | POA: Insufficient documentation

## 2024-10-15 MED ORDER — DICLOFENAC SODIUM 1 % EX GEL
4.0000 g | Freq: Four times a day (QID) | CUTANEOUS | 3 refills | Status: AC
  Filled 2024-10-15 (×2): qty 100, 7d supply, fill #0

## 2024-10-15 MED ORDER — ROSUVASTATIN CALCIUM 20 MG PO TABS: ORAL_TABLET | ORAL | 3 refills | Status: DC

## 2024-10-15 MED ORDER — AMOXICILLIN-POT CLAVULANATE 875-125 MG PO TABS: 1.0000 | ORAL_TABLET | Freq: Two times a day (BID) | ORAL | 0 refills | Status: DC

## 2024-10-15 MED ORDER — CIPROFLOXACIN-DEXAMETHASONE 0.3-0.1 % OT SUSP: 4.0000 [drp] | Freq: Two times a day (BID) | OTIC | 0 refills | Status: DC

## 2024-10-15 MED ORDER — BIKTARVY 50-200-25 MG PO TABS: 1.0000 | ORAL_TABLET | Freq: Every day | ORAL | 1 refills | Status: DC

## 2024-10-15 MED ORDER — CIPROFLOXACIN-HYDROCORTISONE 0.2-1 % OT SUSP: 3.0000 [drp] | Freq: Two times a day (BID) | OTIC | 0 refills | Status: DC

## 2024-10-15 MED ORDER — CIPROFLOXACIN-DEXAMETHASONE 0.3-0.1 % OT SUSP
4.0000 [drp] | Freq: Two times a day (BID) | OTIC | 0 refills | Status: AC
  Filled 2024-10-15: qty 7.5, 7d supply, fill #0

## 2024-10-15 MED ORDER — DICLOFENAC SODIUM 1 % EX GEL
4.0000 g | Freq: Four times a day (QID) | CUTANEOUS | 3 refills | Status: DC
  Filled 2024-10-15: qty 100, 7d supply, fill #0

## 2024-10-15 MED ORDER — DICLOFENAC SODIUM 1 % EX GEL: 4.0000 g | Freq: Four times a day (QID) | CUTANEOUS | 3 refills | Status: DC

## 2024-10-15 MED ORDER — AMOXICILLIN-POT CLAVULANATE 875-125 MG PO TABS
1.0000 | ORAL_TABLET | Freq: Two times a day (BID) | ORAL | 0 refills | Status: DC
  Filled 2024-10-15 (×2): qty 14, 7d supply, fill #0

## 2024-10-15 MED ORDER — AMOXICILLIN-POT CLAVULANATE 875-125 MG PO TABS
1.0000 | ORAL_TABLET | Freq: Two times a day (BID) | ORAL | 0 refills | Status: DC
  Filled 2024-10-15: qty 14, 7d supply, fill #0

## 2024-10-15 MED ORDER — ROSUVASTATIN CALCIUM 20 MG PO TABS
20.0000 mg | ORAL_TABLET | Freq: Every day | ORAL | 3 refills | Status: DC
  Filled 2024-10-15: qty 30, 30d supply, fill #0

## 2024-10-15 MED ORDER — CIPROFLOXACIN-HYDROCORTISONE 0.2-1 % OT SUSP
3.0000 [drp] | Freq: Two times a day (BID) | OTIC | 0 refills | Status: DC
  Filled 2024-10-15: qty 10, 33d supply, fill #0

## 2024-10-15 MED ORDER — ROSUVASTATIN CALCIUM 20 MG PO TABS
20.0000 mg | ORAL_TABLET | Freq: Every day | ORAL | 3 refills | Status: AC
  Filled 2024-10-15: qty 90, fill #0
  Filled 2024-10-15: qty 30, 30d supply, fill #0

## 2024-10-15 NOTE — Assessment & Plan Note (Signed)
 Patient presents with 1 to 2-week history of worsening right ear pain following resolution of RSV infection 1 month prior.  Fortunately has no fever chills or other systemic symptoms at this time.  He denies recent swimming or use of Q-tips in the right ear.  Initial exam revealed impacted cerumen, after the cerumen was disimpacted: Physical exam revealed erythematous and bulging of the right tympanic membrane findings consistent for acute otitis media.  Plan: - Augmentin  twice daily for 7 days

## 2024-10-15 NOTE — Progress Notes (Signed)
 Internal Medicine Clinic Attending  Case discussed with the resident at the time of the visit.  We reviewed the resident's history and exam and pertinent patient test results.  I agree with the assessment, diagnosis, and plan of care documented in the resident's note.

## 2024-10-15 NOTE — Patient Instructions (Addendum)
 Thank you, Mr.Nason A Hunger for allowing us  to provide your care today. Today we discussed right ear pain.  You have both internal and external ear infection. Please use 3 drops of the ciprofloxacin  in your right ear twice a day for one week. Avoid inserting Qtips in your ear. Also avoid water in your ear (use a cotton ball in your ear while you shower).  Also take Augmentin  one tablet twice a day for one week.   Purchase over the counter Debrox drops in your ears once per 2-3 weeks to help limit the amount of ear wax build up you have.  I have ordered the following medication/changed the following medications:   Stop the following medications: There are no discontinued medications.   Start the following medications: Meds ordered this encounter  Medications   ciprofloxacin -hydrocortisone  (CIPRO  HC) OTIC suspension    Sig: Place 3 drops into the right ear 2 (two) times daily for 7 days.    Dispense:  10 mL    Refill:  0   amoxicillin -clavulanate (AUGMENTIN ) 875-125 MG tablet    Sig: Take 1 tablet by mouth 2 (two) times daily.    Dispense:  14 tablet    Refill:  0     Follow up: 3 months    Remember:   Should you have any questions or concerns please call the internal medicine clinic at [phone].     Please note that our late policy has changed.  If you are more than 15 minutes late to your appointment, you may be asked to reschedule your appointment.  Dr. Kandis, D.O. [facility]

## 2024-10-15 NOTE — Addendum Note (Signed)
 Addended by: KANDIS PERKINS on: 10/15/2024 03:09 PM   Modules accepted: Orders

## 2024-10-15 NOTE — Assessment & Plan Note (Signed)
 Patient presents with 1 to 2-week history of worsening right ear pain following resolution of RSV infection 1 month prior.  Fortunately has no fever chills or other systemic symptoms at this time.  He denies recent swimming or use of Q-tips in the right ear.  Initial exam revealed impacted cerumen, after the cerumen was disimpacted: Physical exam reveals an erythematous, inflamed external canal that had pain with insertion of the otoscope.  His exam findings and symptoms consistent with otitis externa. Plan: - Ciprofloxacin  drops twice daily for 1 week -Patient instructed to avoid Q-tips and to use cotton balls to protect the ear canal from moisture while showering

## 2024-10-15 NOTE — Telephone Encounter (Signed)
 Prescription were resent to the [facility] on [address].Copied from CRM [id number]. Topic: Clinical - Prescription Issue >> Oct 15, 2024  2:49 PM Wayne Wolfe wrote: Patient called and stated he medication was sent to the wrong pharmacy.  Medications:   amoxicillin -clavulanate (AUGMENTIN ) 875-125 MG tablet   diclofenac  Sodium (VOLTAREN ) 1 % GEL  rosuvastatin  (CRESTOR ) 20 MG tablet  ciprofloxacin -hydrocortisone  (CIPRO  HC) OTIC suspension    Correct Pharmacy:  [facility] [address], [address] [place] [address] Phone: [phone] Fax: [phone] Hours: M-F 7:30am-7:00p

## 2024-10-15 NOTE — Progress Notes (Signed)
 "  Established Patient Office Visit  Subjective   Patient ID: Wayne Wolfe, male    DOB: 05-22-1966  Age: 59 y.o. MRN: [id number]  Chief Complaint  Patient presents with   Otalgia    Right ear pain for about 1 week    Wayne Wolfe is a 59 y.o. who presents to the clinic for right ear pain. Please see problem based assessment and plan for additional details.    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  HENT:  Positive for ear pain. Negative for congestion and ear discharge.       Objective:     BP 132/81 (BP Location: Right Arm, Patient Position: Sitting, Cuff Size: Normal)   Pulse 80   Temp 97.7 F (36.5 C) (Oral)   Ht 5' 6 (1.676 m)   Wt 231 lb 3.2 oz (104.9 kg)   SpO2 94%   BMI 37.32 kg/m  BP Readings from Last 3 Encounters:  10/15/24 132/81  10/09/24 116/78  09/21/24 127/88   Wt Readings from Last 3 Encounters:  10/15/24 231 lb 3.2 oz (104.9 kg)  10/09/24 231 lb 11.3 oz (105.1 kg)  09/21/24 230 lb 9.6 oz (104.6 kg)      Physical Exam HENT:     Right Ear: Tympanic membrane is  erythematous and bulging.     Left Ear: Tympanic membrane and ear canal normal. Tympanic membrane is not erythematous or bulging.     Ears:     Comments: Right external canal was erythematous, painful with insertion of otoscope, with inflammation present.  Cardiovascular:     Rate and Rhythm: Normal rate.  Pulmonary:     Effort: Pulmonary effort is normal.     Breath sounds: Normal breath sounds.  Skin:    General: Skin is warm and dry.  Psychiatric:        Mood and Affect: Mood and affect normal.      No results found for any visits on 10/15/24.  Last metabolic panel Lab Results  Component Value Date   GLUCOSE 105 (H) 10/09/2024   NA 136 10/09/2024   K 4.2 10/09/2024   CL 103 10/09/2024   CO2 25 10/09/2024   BUN 11 10/09/2024   CREATININE 0.96 10/09/2024   EGFR 90 10/09/2024   CALCIUM  8.9 10/09/2024   PHOS 3.8 10/09/2024   PROT 6.8 10/09/2024   ALBUMIN 3.5 01/12/2019   BILITOT 0.4 10/09/2024   ALKPHOS 48 01/12/2019   AST 13 10/09/2024   ALT 14 10/09/2024   ANIONGAP 10 01/12/2019      The ASCVD Risk score (Arnett DK, et al., 2019) failed  to calculate for the following reasons:   The valid total cholesterol range is 130 to 320 mg/dL    Assessment & Plan:   Problem List Items Addressed This Visit       Endocrine   Hyperlipidemia associated with type 2 diabetes mellitus (HCC) (Chronic)   Relevant Medications   rosuvastatin  (CRESTOR ) 20 MG tablet     Nervous and Auditory   Acute otitis media   Patient presents with 1 to 2-week history of worsening right ear pain following resolution of RSV infection 1 month prior.  Fortunately has no fever chills or other systemic symptoms at this time.  He denies recent swimming or use of Q-tips in the right ear.  Initial exam revealed impacted cerumen, after the cerumen was disimpacted: Physical exam revealed erythematous and bulging of the right tympanic membrane findings consistent for acute otitis media.  Plan: -  Augmentin  twice daily for 7 days      Relevant Medications   amoxicillin -clavulanate (AUGMENTIN ) 875-125 MG tablet   Otitis externa - Primary   Patient presents with 1 to 2-week history of worsening right ear pain following resolution of RSV infection 1 month prior.  Fortunately has no fever chills or other systemic symptoms at this time.  He denies recent swimming or use of Q-tips in the right ear.  Initial exam revealed impacted cerumen, after the cerumen was disimpacted: Physical exam reveals an erythematous, inflamed external canal that had pain with insertion of the otoscope.  His exam findings and symptoms consistent with otitis externa. Plan: - Ciprofloxacin  drops twice daily for 1 week -Patient instructed to avoid Q-tips and to use cotton balls to protect the ear canal from moisture while showering        Other   Knee pain   Relevant Medications   diclofenac  Sodium (VOLTAREN ) 1 % GEL    Return in about 3 months (around 01/13/2025) for chronic conditions .    Damien Lease, DO  "

## 2024-10-15 NOTE — Addendum Note (Signed)
 Addended by: KANDIS PERKINS on: 10/15/2024 03:34 PM   Modules accepted: Orders

## 2024-10-16 ENCOUNTER — Other Ambulatory Visit: Payer: Self-pay | Admitting: Infectious Disease

## 2024-10-16 ENCOUNTER — Other Ambulatory Visit: Payer: Self-pay

## 2024-10-16 ENCOUNTER — Other Ambulatory Visit (HOSPITAL_COMMUNITY): Payer: Self-pay

## 2024-10-16 DIAGNOSIS — B20 Human immunodeficiency virus [HIV] disease: Secondary | ICD-10-CM

## 2024-10-16 MED ORDER — BIKTARVY 50-200-25 MG PO TABS
1.0000 | ORAL_TABLET | Freq: Every day | ORAL | 3 refills | Status: AC
  Filled 2024-10-16 – 2024-10-28 (×2): qty 30, 30d supply, fill #0

## 2024-10-20 ENCOUNTER — Ambulatory Visit: Admitting: Cardiology

## 2024-10-21 ENCOUNTER — Encounter: Payer: Self-pay | Admitting: Cardiology

## 2024-10-21 ENCOUNTER — Other Ambulatory Visit (HOSPITAL_COMMUNITY): Payer: Self-pay

## 2024-10-21 ENCOUNTER — Ambulatory Visit: Attending: Cardiology | Admitting: Cardiology

## 2024-10-21 VITALS — BP 128/88 | HR 79 | Resp 16 | Ht 66.0 in | Wt 234.8 lb

## 2024-10-21 DIAGNOSIS — E782 Mixed hyperlipidemia: Secondary | ICD-10-CM | POA: Diagnosis not present

## 2024-10-21 DIAGNOSIS — I152 Hypertension secondary to endocrine disorders: Secondary | ICD-10-CM

## 2024-10-21 DIAGNOSIS — E1159 Type 2 diabetes mellitus with other circulatory complications: Secondary | ICD-10-CM | POA: Diagnosis not present

## 2024-10-21 DIAGNOSIS — I2510 Atherosclerotic heart disease of native coronary artery without angina pectoris: Secondary | ICD-10-CM | POA: Diagnosis not present

## 2024-10-21 DIAGNOSIS — E1165 Type 2 diabetes mellitus with hyperglycemia: Secondary | ICD-10-CM | POA: Diagnosis not present

## 2024-10-21 DIAGNOSIS — R911 Solitary pulmonary nodule: Secondary | ICD-10-CM | POA: Diagnosis not present

## 2024-10-21 DIAGNOSIS — Z794 Long term (current) use of insulin: Secondary | ICD-10-CM

## 2024-10-21 NOTE — Progress Notes (Signed)
 " Cardiology Office Note:  .   Date:  10/21/2024  ID:  Wayne Wolfe, DOB April 18, 1966, MRN [id number] PCP:  Norrine Sharper, MD  Former Cardiology Providers: Dr. Aleene Passe [facility] Providers Cardiologist:  Madonna Large, DO , [facility] (established care 10/21/24) Electrophysiologist:  None  Click to update primary MD,subspecialty MD or APP then REFRESH:1}    Chief Complaint  Patient presents with   Coronary artery disease involving native coronary artery of   Follow-up    History of Present Illness: .   Wayne Wolfe is a 59 y.o. African-American male whose past medical history and cardiovascular risk factors includes: HIV (2009), Insulin  dependent diabetes mellitus, hypertension, hyperlipidemia, coronary calcification, former smoker.  Formally under the care of Dr. Aleene Passe who last saw Wayne Wolfe back in 07/2023. I am seeing him for the first time to re-establishing care.   He feels well without new cardiovascular symptoms. He uses insulin  and metformin  for diabetes, with A1c improved from close 12% to 7.6% as of January 2026. His LDL is under 70mg /dL. He has HIV diagnosed in 2009 and is treated with Biktarvy . Prior CT scans noted pulmonary nodule which was being followed but no imaging was done in 2025.  He quit smoking over 20 years ago, does not drink alcohol, and his weight is stable.  Review of Systems: .   Review of Systems  Cardiovascular:  Negative for chest pain, claudication, irregular heartbeat, leg swelling, near-syncope, orthopnea, palpitations, paroxysmal nocturnal dyspnea and syncope.  Respiratory:  Negative for shortness of breath.   Hematologic/Lymphatic: Negative for bleeding problem.    Studies Reviewed:   EKG: EKG Interpretation Date/Time:  Wednesday October 21 2024 09:51:51 EST Ventricular Rate:  79 PR Interval:  200 QRS Duration:  64 QT Interval:  354 QTC Calculation: 405 R Axis:   50  Text Interpretation: Normal sinus rhythm Cannot  rule out Anterior infarct , age undetermined When compared with ECG of 29-Apr-2023 09:55, T wave inversion less evident in Inferior leads T wave inversion no longer evident in Lateral leads Confirmed by Large Madonna [phone]) on 10/21/2024 9:58:48 AM  Echocardiogram: NA  Cardiac CTA: 05/2023 1. Coronary calcium  score of 264. This was 95 percentile for age-, sex, and race-matched controls.   2. Total plaque volume 331 mm3 which is 57 percentile for age- and sex-matched controls (calcified plaque 39 mm3; non-calcified plaque 256mm3). TPV is (severe).   3. Normal coronary origin with right dominance.   4. Moderate (50-69) stenosis in the very distal LAD; otherwise mild (25-49) CAD as outlined.  5.  Radiology over read: 5 mm posterior right lower lobe pulmonary nodule. No follow-up needed if patient is low-risk.This recommendation follows the consensus statement: Guidelines for Management of Incidental Pulmonary Nodules Detected on CT Images: From the Fleischner Society 2017; Radiology 2017; 284:228-243.   RECOMMENDATIONS: CAD-RADS 3: Moderate stenosis. Consider symptom-guided anti-ischemic pharmacotherapy as well as risk factor modification per guideline directed care. Additional analysis with CT FFR will be submitted.  RADIOLOGY: NA  Risk Assessment/Calculations:   NA   Labs:       Latest Ref Rng & Units 10/09/2024    9:38 AM 09/11/2024   10:48 AM 07/13/2024    1:53 PM  CBC  WBC 3.8 - 10.8 Thousand/uL 4.0  4.0  3.9   Hemoglobin 13.2 - 17.1 g/dL 84.9  83.0  83.7   Hematocrit 39.4 - 51.1 % 46.1  52.4  49.1   Platelets 140 - 400 Thousand/uL 256  225  247        Latest Ref Rng & Units 10/09/2024    9:38 AM 09/11/2024   10:48 AM 07/13/2024    1:53 PM  BMP  Glucose 65 - 99 mg/dL 894  81  83   BUN 7 - 25 mg/dL 11  9  9    Creatinine 0.70 - 1.35 mg/dL 9.03  8.84  8.99   BUN/Creat Ratio 6 - 22 (calc) SEE NOTE:  SEE NOTE:  SEE NOTE:   Sodium 135 - 146 mmol/L 136  138  138   Potassium  3.5 - 5.3 mmol/L 4.2  4.4  4.1   Chloride 98 - 110 mmol/L 103  101  103   CO2 20 - 32 mmol/L 25  28  28    Calcium  8.6 - 10.3 mg/dL 8.9  9.0  9.0       Latest Ref Rng & Units 10/09/2024    9:38 AM 09/11/2024   10:48 AM 07/13/2024    1:53 PM  CMP  Glucose 65 - 99 mg/dL 894  81  83   BUN 7 - 25 mg/dL 11  9  9    Creatinine 0.70 - 1.35 mg/dL 9.03  8.84  8.99   Sodium 135 - 146 mmol/L 136  138  138   Potassium 3.5 - 5.3 mmol/L 4.2  4.4  4.1   Chloride 98 - 110 mmol/L 103  101  103   CO2 20 - 32 mmol/L 25  28  28    Calcium  8.6 - 10.3 mg/dL 8.9  9.0  9.0   Total Protein 6.1 - 8.1 g/dL 6.8  6.9  7.0   Total Bilirubin 0.2 - 1.2 mg/dL 0.4  0.6  0.5   AST 10 - 35 U/L 13  14  13    ALT 9 - 46 U/L 14  13  14      Lab Results  Component Value Date   CHOL 108 07/13/2024   HDL 31 (L) 07/13/2024   LDLCALC 61 07/13/2024   TRIG 79 07/13/2024   CHOLHDL 3.5 07/13/2024   No results for input(s): LIPOA in the last 8760 hours. No components found for: NTPROBNP No results for input(s): PROBNP in the last 8760 hours. Recent Labs    09/11/24 1048  TSH 0.56    Physical Exam:    Today's Vitals   10/21/24 0949  BP: 128/88  Pulse: 79  Resp: 16  SpO2: 98%  Weight: 234 lb 12.8 oz (106.5 kg)  Height: 5' 6 (1.676 m)   Body mass index is 37.9 kg/m. Wt Readings from Last 3 Encounters:  10/21/24 234 lb 12.8 oz (106.5 kg)  10/15/24 231 lb 3.2 oz (104.9 kg)  10/09/24 231 lb 11.3 oz (105.1 kg)    Physical Exam  Constitutional: No distress.  hemodynamically stable  Neck: No JVD present.  Cardiovascular: Normal rate, regular rhythm, S1 normal and S2 normal. Exam reveals no gallop, no S3 and no S4.  No murmur heard. Pulmonary/Chest: Effort normal and breath sounds normal. No stridor. He has no wheezes. He has no rales.  Musculoskeletal:        General: No edema.     Cervical back: Neck supple.  Skin: Skin is warm.     Impression & Recommendation(s):  Impression:   ICD-10-CM   1.  Coronary artery disease involving native coronary artery of native heart without angina pectoris  I25.10 EKG 12-Lead    2. Hypertension associated with diabetes (HCC)  E11.59  I15.2     3. Mixed hyperlipidemia  E78.2     4. Type 2 diabetes mellitus with hyperglycemia, with long-term current use of insulin  (HCC)  E11.65    Z79.4     5. Pulmonary nodule  R91.1        Recommendation(s):  Coronary artery disease involving native coronary artery of native heart without angina pectoris Denies anginal chest pain or heart failure symptoms. EKG illustrates sinus rhythm with improvement in ST-T changes when compared to prior tracings. Coronary CTA 05/21/2023: Total CAC 264, 95th percentile, severe total plaque volume, and mild to moderate CAD Start aspirin 81 mg p.o. daily. Continue Crestor  20 mg p.o. nightly.  LDL 61 mg/dL on current therapy Patient has made significant improvement in his glycemic control.  Prior A1c's were closer to 12% most recent A1c is 7.6 % as of January 2026.  Congratulated on his efforts. Patient is a non-smoker. No additional workup warranted at this time  Hypertension associated with diabetes (HCC) Office blood pressures are well-controlled. Continue lisinopril  20 mg p.o. daily  Mixed hyperlipidemia Chronic and stable. Continue Crestor  20 mg p.o. nightly lipids are well-controlled  Type 2 diabetes mellitus with hyperglycemia, with long-term current use of insulin  (HCC) Improving. Hemoglobin A1c 7.6% as of January 2026. Currently on insulin , ACE inhibitors, statin therapy. Recommend considering SGLT2 inhibitors if further antiglycemic agents are needed  Pulmonary nodule Given his history of HIV on therapy and former smoker recommend that he discuss his nodule management with PCP. Prior to his coronary CTA he was getting regular CT scans for following up on nodules No imaging was performed in 2025 Recommend that he follows up with PCP and consider reimaging  given his immunocompromise state otherwise consider pulmonary consult for further clarity and follow-through.  Patient agreeable with the plan of care he will speak to his primary care provider as he has an appointment later this month.  Orders Placed:  Orders Placed This Encounter  Procedures   EKG 12-Lead     Final Medication List:   No orders of the defined types were placed in this encounter.   There are no discontinued medications.  Current Medications[1]  Consent:   NA  Disposition:   1 year follow-up sooner if needed  His questions and concerns were addressed to his satisfaction. He voices understanding of the recommendations provided during this encounter.    Signed, Madonna Large, DO, [facility]  A Division of [facility] [address], [place], [place] [address]  10/21/2024 4:17 PM     [1]  Current Outpatient Medications:    Accu-Chek FastClix Lancets MISC, Check blood sugar before meals and after meals, Disp: 102 each, Rfl: 12   acetaminophen  (TYLENOL ) 500 MG tablet, Take 1,000 mg by mouth every 6 (six) hours as needed for fever., Disp: , Rfl:    amoxicillin -clavulanate (AUGMENTIN ) 875-125 MG tablet, Take 1 tablet by mouth 2 (two) times daily., Disp: 14 tablet, Rfl: 0   bictegravir-emtricitabine -tenofovir  AF (BIKTARVY ) 50-200-25 MG TABS tablet, Take 1 tablet by mouth daily., Disp: 90 tablet, Rfl: 3   Blood Glucose Monitoring Suppl (ACCU-CHEK GUIDE) w/Device KIT, 1 each by Does not apply route 6 (six) times daily., Disp: 1 kit, Rfl: 0   ciprofloxacin -dexamethasone  (CIPRODEX ) OTIC suspension, Place 4 drops into the right ear 2 (two) times daily for 7 days., Disp: 7.5 mL, Rfl: 0   cyclobenzaprine  (FLEXERIL ) 10 MG tablet, Take 1 tablet (10 mg total) by mouth 3 (three) times  daily as needed for muscle spasms., Disp: 30 tablet, Rfl: 1   diclofenac  Sodium (VOLTAREN ) 1 % GEL, Apply 4 grams topically 4 (four) times daily as needed, Disp: 100  g, Rfl: 3   gabapentin  (NEURONTIN ) 300 MG capsule, Take 1 capsule (300 mg total) by mouth 3 (three) times daily., Disp: 90 capsule, Rfl: 3   glucose blood (ACCU-CHEK GUIDE) test strip, Check blood sugar before meals and after meals, Disp: 100 each, Rfl: 12   insulin  glargine (LANTUS  SOLOSTAR) 100 UNIT/ML Solostar Pen, Inject 30 Units into the skin daily., Disp: 15 mL, Rfl: 3   Insulin  Pen Needle (PEN NEEDLES) 32G X 4 MM MISC, Use daily as directed., Disp: 200 each, Rfl: 1   lipase/protease/amylase (CREON ) 36000 UNITS CPEP capsule, Take 2 capsules (72,000 Units total) by mouth 3 (three) times daily with meals AND 2 capsules (72,000 Units total) with snacks., Disp: 300 capsule, Rfl: 6   lisinopril  (ZESTRIL ) 20 MG tablet, Take 1 tablet (20 mg total) by mouth daily., Disp: 90 tablet, Rfl: 3   metFORMIN  (GLUCOPHAGE -XR) 500 MG 24 hr tablet, Take 2 tablets (1,000 mg total) by mouth 2 (two) times daily with a meal., Disp: 360 tablet, Rfl: 2   rosuvastatin  (CRESTOR ) 20 MG tablet, Take 1 tablet (20 mg total) by mouth daily., Disp: 90 tablet, Rfl: 3   Study - A5426 - dasatinib  100 mg or placebo capsule ([place]), Take 1 capsule (100 mg total) by mouth as directed. Take with food daily for 2 consecutive days on days 1 and 2 at the beginning of each 14 day cycle. Swallow whole. Do not chew, cut, or crush., Disp: 1 capsule, Rfl: 0   Study - J4573 - quercetin  250 mg or placebo capsule ([place]), Take 5 capsules (1,250 mg total) by mouth as directed. Take with food daily for 2 consecutive days on days 1 and 2 at the beginning of each 14 day cycle. Swallow whole. Do not chew, cut, or crush., Disp: 5 capsule, Rfl: 0  Current Facility-Administered Medications:    Study - J4573 - dasatinib  100 mg or placebo capsule ([place]) CAM, 1 capsule, Oral, UD, , 100 mg at 10/09/24 1126  "

## 2024-10-21 NOTE — Patient Instructions (Addendum)
 Medication Instructions:  START Aspirin 81 mg. Take one (1) tablet by mouth once daily. If cleared by research study. Call and inform us  of their response.  *If you need a refill on your cardiac medications before your next appointment, please call your pharmacy*  Lab Work: None ordered If you have labs (blood work) drawn today and your tests are completely normal, you will receive your results only by: MyChart Message (if you have MyChart) OR A paper copy in the mail If you have any lab test that is abnormal or we need to change your treatment, we will call you to review the results.  Testing/Procedures: None ordered  Follow-Up: At [facility], you and your health needs are our priority.  As part of our continuing mission to provide you with exceptional heart care, our providers are all part of one team.  This team includes your primary Cardiologist (physician) and Advanced Practice Providers or APPs (Physician Assistants and Nurse Practitioners) who all work together to provide you with the care you need, when you need it.  Your next appointment:   1 year(s)  Provider:   Madonna Large, DO    We recommend signing up for the patient portal called MyChart.  Sign up information is provided on this After Visit Summary.  MyChart is used to connect with patients for Virtual Visits (Telemedicine).  Patients are able to view lab/test results, encounter notes, upcoming appointments, etc.  Non-urgent messages can be sent to your provider as well.   To learn more about what you can do with MyChart, go to [email or web address].   Other Instructions Please follow up with your Primary Care Provider for evaluation and treatment of Lung Nodule

## 2024-10-23 ENCOUNTER — Other Ambulatory Visit: Payer: Self-pay

## 2024-10-23 ENCOUNTER — Encounter: Payer: Self-pay | Admitting: *Deleted

## 2024-10-23 VITALS — BP 138/89 | HR 85 | Temp 98.2°F | Wt 234.3 lb

## 2024-10-23 DIAGNOSIS — Z006 Encounter for examination for normal comparison and control in clinical research program: Secondary | ICD-10-CM

## 2024-10-23 MED ORDER — STUDY - A5426 - QUERCETIN 250 MG OR PLACEBO CAPSULE (PI-VAN DAM)
5.0000 | ORAL_CAPSULE | ORAL | Status: AC
  Administered 2024-10-23: 1250 mg via ORAL
  Filled 2024-10-23: qty 5

## 2024-10-23 MED ORDER — STUDY - A5426 - DASATINIB 100 MG OR PLACEBO CAPSULE (PI-VAN DAM)
1.0000 | ORAL_CAPSULE | ORAL | Status: AC
  Administered 2024-10-23: 100 mg via ORAL
  Filled 2024-10-23: qty 1

## 2024-10-23 MED ORDER — STUDY - A5426 - QUERCETIN 250 MG OR PLACEBO CAPSULE (PI-VAN DAM): 1250.0000 mg | ORAL_CAPSULE | ORAL | 0 refills | Status: AC

## 2024-10-23 MED ORDER — STUDY - A5426 - DASATINIB 100 MG OR PLACEBO CAPSULE (PI-VAN DAM): 100.0000 mg | ORAL_CAPSULE | ORAL | 0 refills | Status: AC

## 2024-10-23 NOTE — Research (Addendum)
 Kaden here for his Week 2 visit for [phone] [facility]) study: Phase 2, multicenter, randomized, double-blinded, placebo-controlled trial to evaluate safety, tolerability and efficacy of dasatinib  and quercetin  together in improving physical function measurements in people with HIV who are frail or prefrail. All study procedures completed per protocol. Study IP dispensed. He will return in 2 weeks for his next study visit.

## 2024-10-24 LAB — CBC WITH DIFFERENTIAL/PLATELET
Absolute Lymphocytes: 2896 {cells}/uL (ref 850–3900)
Absolute Monocytes: 436 {cells}/uL (ref 200–950)
Basophils Absolute: 59 {cells}/uL (ref 0–200)
Basophils Relative: 1.2 %
Eosinophils Absolute: 167 {cells}/uL (ref 15–500)
Eosinophils Relative: 3.4 %
HCT: 47.3 % (ref 39.4–51.1)
Hemoglobin: 15.2 g/dL (ref 13.2–17.1)
MCH: 29.2 pg (ref 27.0–33.0)
MCHC: 32.1 g/dL (ref 31.6–35.4)
MCV: 90.8 fL (ref 81.4–101.7)
MPV: 9.4 fL (ref 7.5–12.5)
Monocytes Relative: 8.9 %
Neutro Abs: 1343 {cells}/uL — ABNORMAL LOW (ref 1500–7800)
Neutrophils Relative %: 27.4 %
Platelets: 290 10*3/uL (ref 140–400)
RBC: 5.21 Million/uL (ref 4.20–5.80)
RDW: 14.2 % (ref 11.0–15.0)
Total Lymphocyte: 59.1 %
WBC: 4.9 10*3/uL (ref 3.8–10.8)

## 2024-10-24 LAB — COMPREHENSIVE METABOLIC PANEL WITH GFR
AG Ratio: 1.4 (calc) (ref 1.0–2.5)
ALT: 13 U/L (ref 9–46)
AST: 13 U/L (ref 10–35)
Albumin: 4.2 g/dL (ref 3.6–5.1)
Alkaline phosphatase (APISO): 65 U/L (ref 35–144)
BUN: 10 mg/dL (ref 7–25)
CO2: 25 mmol/L (ref 20–32)
Calcium: 9 mg/dL (ref 8.6–10.3)
Chloride: 102 mmol/L (ref 98–110)
Creat: 0.97 mg/dL (ref 0.70–1.35)
Globulin: 3 g/dL (ref 1.9–3.7)
Glucose, Bld: 115 mg/dL — ABNORMAL HIGH (ref 65–99)
Potassium: 4.3 mmol/L (ref 3.5–5.3)
Sodium: 138 mmol/L (ref 135–146)
Total Bilirubin: 0.5 mg/dL (ref 0.2–1.2)
Total Protein: 7.2 g/dL (ref 6.1–8.1)
eGFR: 89 mL/min/{1.73_m2}

## 2024-10-24 LAB — MAGNESIUM: Magnesium: 1.9 mg/dL (ref 1.5–2.5)

## 2024-10-24 LAB — PHOSPHORUS: Phosphorus: 3.7 mg/dL (ref 2.5–4.5)

## 2024-10-26 ENCOUNTER — Other Ambulatory Visit (HOSPITAL_COMMUNITY): Payer: Self-pay

## 2024-10-27 ENCOUNTER — Ambulatory Visit: Admitting: Student

## 2024-10-27 ENCOUNTER — Other Ambulatory Visit (HOSPITAL_COMMUNITY): Payer: Self-pay

## 2024-10-28 ENCOUNTER — Ambulatory Visit: Admitting: Student

## 2024-10-28 ENCOUNTER — Other Ambulatory Visit (HOSPITAL_COMMUNITY): Payer: Self-pay

## 2024-10-28 ENCOUNTER — Other Ambulatory Visit: Payer: Self-pay

## 2024-10-28 VITALS — BP 128/83 | HR 81 | Temp 97.8°F | Ht 66.0 in | Wt 232.8 lb

## 2024-10-28 DIAGNOSIS — Z794 Long term (current) use of insulin: Secondary | ICD-10-CM | POA: Diagnosis not present

## 2024-10-28 DIAGNOSIS — R1310 Dysphagia, unspecified: Secondary | ICD-10-CM | POA: Diagnosis not present

## 2024-10-28 DIAGNOSIS — K625 Hemorrhage of anus and rectum: Secondary | ICD-10-CM

## 2024-10-28 DIAGNOSIS — R911 Solitary pulmonary nodule: Secondary | ICD-10-CM | POA: Diagnosis not present

## 2024-10-28 DIAGNOSIS — E1169 Type 2 diabetes mellitus with other specified complication: Secondary | ICD-10-CM | POA: Diagnosis not present

## 2024-10-28 DIAGNOSIS — E1159 Type 2 diabetes mellitus with other circulatory complications: Secondary | ICD-10-CM | POA: Diagnosis not present

## 2024-10-28 DIAGNOSIS — I152 Hypertension secondary to endocrine disorders: Secondary | ICD-10-CM

## 2024-10-28 LAB — POC HEMOCCULT BLD/STL (OFFICE/1-CARD/DIAGNOSTIC): Fecal Occult Blood, POC: NEGATIVE

## 2024-10-28 MED ORDER — LANTUS SOLOSTAR 100 UNIT/ML ~~LOC~~ SOPN
30.0000 [IU] | PEN_INJECTOR | Freq: Every day | SUBCUTANEOUS | 3 refills | Status: AC
  Filled 2024-10-28: qty 9, 30d supply, fill #0

## 2024-10-28 NOTE — Assessment & Plan Note (Addendum)
 Benign RLL lung nodule, stable since March 2019 on multiple follow-up scans, that was again noted on CAC scan in 2024. Re-imaging was suggested during a recent cardiology appointment. Based on shared decision making, he will undergo re-imaging of the lung nodule and if stable in appearance we will stop following with chest CT. Orders:   CT CHEST LCS NODULE F/U LOW DOSE WO CONTRAST; Future

## 2024-10-28 NOTE — Progress Notes (Signed)
 " Patient name: Wayne Wolfe Date of birth: July 21, 1966 Date of visit: 10/28/24  Subjective  Reason for visit: Follow-up (Medication refill / rerquesting PT referral/ been having pain in leg but not at present)  Discussed the use of AI scribe software for clinical note transcription with the patient, who gave verbal consent to proceed.  History of Present Illness   Wayne Wolfe is a 59 year old male who presents with rectal bleeding and swallowing difficulties.  Rectal bleeding and melena - Intermittent rectal bleeding for several months - Occasional melena, last episode 1-2 weeks ago - Less frequent hematochezia, last episode a couple of months ago - No associated nausea, hematemesis, dyspnea, or pain with bowel movements - No iron supplementation - Colonoscopy in 2021 showed multiple polyps and a low-grade squamous intraepithelial lesion - LSIL was followed by [facility] and May 2025 anoscopy showed no lesions to biopsy - Takes Citrucel for bowel regulation  Dysphagia and gastroesophageal symptoms - Intermittent dysphagia, primarily with rapid eating - Sensation of food getting stuck in the throat approximately every other week - Last episode occurred over the weekend while eating eggs and bacon - No difficulty swallowing liquids - Occasional heartburn - No use of acid-suppressing medication  Weight changes - Weight has been stable for years - Recent perception of unintentional weight loss  Sexual history - Sexually active with male partners - Engages in receptive anal intercourse  Diabetes mellitus management - Uses insulin  and requests a refill - Takes metformin  without current refill needs  Hyperlipidemia management - Takes rosuvastatin  without current refill needs  Hypertension management - Doing well on current blood pressure medication  Recent antibiotic use - Recently completed a course of antibiotics       Show/hide medication list[1]   Objective   Today's Vitals   10/28/24 0906 10/28/24 0913  BP: (!) 143/89 128/83  Pulse: 79 81  Temp: 97.8 F (36.6 C)   TempSrc: Oral   SpO2: 98%   Weight: 232 lb 12.8 oz (105.6 kg)   Height: 5' 6 (1.676 m)   Body mass index is 37.57 kg/m.   Physical Exam Constitutional:      Appearance: Normal appearance.  Cardiovascular:     Rate and Rhythm: Normal rate and regular rhythm.  Pulmonary:     Effort: Pulmonary effort is normal. No respiratory distress.  Genitourinary:    Rectum: Guaiac result negative.     Comments: No fissures, hemorrhoids, or skin lesions on external anal exam. Digital rectal exam without masses. Lymphadenopathy:     Cervical: No cervical adenopathy.  Skin:    General: Skin is warm and dry.  Neurological:     Mental Status: He is alert.     Cranial Nerves: No facial asymmetry.  Psychiatric:        Mood and Affect: Affect normal.        Speech: Speech normal.        Behavior: Behavior normal.   Results   Labs CBC (10/10/2023): Normal hemoglobin  Radiology Chest CT (2024) (2024): Stable pulmonary nodule; unchanged from prior studies in 2020 and 2023  Diagnostic Colonoscopy (2021) (2021): Multiple polyps; low grade squamous intraepithelial lesion  Pathology Biopsy of hypertrophied anal papillae (2021) (2021): Low grade squamous intraepithelial lesion       Assessment & Plan Rectal bleeding With intermittent bright red blood and dark stool reported. Doubt they're related. No lesions on exam today to which bleeding can be attributed to, guaiac negative. Stable hemoglobin without  anemia or microcytosis on multiple measurements x 1 year. Last colonoscopy in 2021 without polyps, but LSIL that was adequately followed by [facility] with high-resolution anoscopy. Saw GI for these issues in 2025 but was felt to be low risk and thus endoscopic workup was deferred. Dark stool not likely melena but normal variant given paucity of alarm findings on history, exam, and laboratory  workup to date. Rectal bleeding may be resolved hemorrhoid or fissure or related to sexual activity. Monitor and reassess at follow-up.  Orders:   POC Hemoccult Bld/Stl (1-Cd Office Dx)  Dysphagia, unspecified type Ongoing x 6 months. Intermittent, infrequent, solid food sticking at level of thyroid cartilage. Stable weight. Doubt reported dark stool is melena. No alarm findings. Suspect mild functional oropharyngeal or esophageal dysphagia. Doubt obstructing mass or ring, doubt eosinophilic esophagitis. Based on shared decision making we'll defer GI referral but monitor closely for change in symptoms or alarm findings like unintentional weight loss or frank melena.    Pulmonary nodule Benign RLL lung nodule, stable since March 2019 on multiple follow-up scans, that was again noted on CAC scan in 2024. Re-imaging was suggested during a recent cardiology appointment. Based on shared decision making, he will undergo re-imaging of the lung nodule and if stable in appearance we will stop following with chest CT. Orders:   CT CHEST LCS NODULE F/U LOW DOSE WO CONTRAST; Future   Hypertension associated with diabetes (HCC) BP Readings from Last 3 Encounters:  10/28/24 128/83  10/23/24 138/89  10/21/24 128/88   Chronic, good control, good BP in the office today.  GFR and electrolytes okay on last chemistry panel.  Continue lisinopril  20 mg daily.     Type 2 diabetes mellitus with other specified complication, with long-term current use of insulin  [facility]) Lab Results  Component Value Date   HGBA1C 7.6 (H) 10/09/2024   HGBA1C 7.1 (H) 09/11/2024   HGBA1C 7.1 (A) 07/27/2024   Chronic with much better glycemic control of late.  On glargine 30 units daily and metformin  ER 1000 mg twice daily.  No hypoglycemia.  Checks blood sugar with intermittent fingerstick.  Continue medications per above for now.  Ophthalmology referral pending for eye exam. Orders:   insulin  glargine (LANTUS  SOLOSTAR) 100 UNIT/ML  Solostar Pen; Inject 30 Units into the skin daily.   Retinal/fundus photography   Return in about 3 months (around 01/26/2025) for chronic condition management, or sooner if needed.  Ozell Kung MD 10/28/2024, 10:22 AM         [1]  Outpatient Medications Prior to Visit  Medication Sig   aspirin 81 MG chewable tablet Chew 81 mg by mouth daily.   Accu-Chek FastClix Lancets MISC Check blood sugar before meals and after meals   acetaminophen  (TYLENOL ) 500 MG tablet Take 1,000 mg by mouth every 6 (six) hours as needed for fever.   bictegravir-emtricitabine -tenofovir  AF (BIKTARVY ) 50-200-25 MG TABS tablet Take 1 tablet by mouth daily.   Blood Glucose Monitoring Suppl (ACCU-CHEK GUIDE) w/Device KIT 1 each by Does not apply route 6 (six) times daily.   cyclobenzaprine  (FLEXERIL ) 10 MG tablet Take 1 tablet (10 mg total) by mouth 3 (three) times daily as needed for muscle spasms.   diclofenac  Sodium (VOLTAREN ) 1 % GEL Apply 4 grams topically 4 (four) times daily as needed   gabapentin  (NEURONTIN ) 300 MG capsule Take 1 capsule (300 mg total) by mouth 3 (three) times daily.   glucose blood (ACCU-CHEK GUIDE) test strip Check blood sugar before meals and after  meals   Insulin  Pen Needle (PEN NEEDLES) 32G X 4 MM MISC Use daily as directed.   lipase/protease/amylase (CREON ) 36000 UNITS CPEP capsule Take 2 capsules (72,000 Units total) by mouth 3 (three) times daily with meals AND 2 capsules (72,000 Units total) with snacks.   lisinopril  (ZESTRIL ) 20 MG tablet Take 1 tablet (20 mg total) by mouth daily.   metFORMIN  (GLUCOPHAGE -XR) 500 MG 24 hr tablet Take 2 tablets (1,000 mg total) by mouth 2 (two) times daily with a meal.   rosuvastatin  (CRESTOR ) 20 MG tablet Take 1 tablet (20 mg total) by mouth daily.   Study - [phone] - dasatinib  100 mg or placebo capsule (PI-[place]) Take 1 capsule (100 mg total) by mouth as directed. Take with food daily for 2 consecutive days on days 1 and 2 at the beginning of  each 14 day cycle. Swallow whole. Do not chew, cut, or crush.   Study - [id number] - dasatinib  100 mg or placebo capsule ([place]) Take 1 capsule (100 mg total) by mouth as directed. Take with food daily for 2 consecutive days on days 1 and 2 at the beginning of each 14 day cycle. Swallow whole. Do not chew, cut, or crush.   Study - [phone] - quercetin  250 mg or placebo capsule (PI[place]) Take 5 capsules (1,250 mg total) by mouth as directed. Take with food daily for 2 consecutive days on days 1 and 2 at the beginning of each 14 day cycle. Swallow whole. Do not chew, cut, or crush.   Study - [phone] - quercetin  250 mg or placebo capsule (PI[place]) Take 5 capsules (1,250 mg total) by mouth as directed. Take with food daily for 2 consecutive days on days 1 and 2 at the beginning of each 14 day cycle. Swallow whole. Do not chew, cut, or crush.   [DISCONTINUED] amoxicillin -clavulanate (AUGMENTIN ) 875-125 MG tablet Take 1 tablet by mouth 2 (two) times daily.   [DISCONTINUED] insulin  glargine (LANTUS  SOLOSTAR) 100 UNIT/ML Solostar Pen Inject 30 Units into the skin daily.   Facility-Administered Medications Prior to Visit  Medication Dose Route Frequency Provider   Study [phone] - dasatinib  100 mg or placebo capsule (PI[place]) CAM  1 capsule Oral UD    Study - [phone] - dasatinib  100 mg or placebo capsule ([place]) CAM  1 capsule Oral UD    Study - [phone] - quercetin  250 mg or placebo capsule ([place]) CAM  5 capsule Oral UD    "

## 2024-10-28 NOTE — Assessment & Plan Note (Addendum)
 Lab Results  Component Value Date   HGBA1C 7.6 (H) 10/09/2024   HGBA1C 7.1 (H) 09/11/2024   HGBA1C 7.1 (A) 07/27/2024   Chronic with much better glycemic control of late.  On glargine 30 units daily and metformin  ER 1000 mg twice daily.  No hypoglycemia.  Checks blood sugar with intermittent fingerstick.  Continue medications per above for now.  Ophthalmology referral pending for eye exam. Orders:   insulin  glargine (LANTUS  SOLOSTAR) 100 UNIT/ML Solostar Pen; Inject 30 Units into the skin daily.   Retinal/fundus photography

## 2024-10-28 NOTE — Assessment & Plan Note (Addendum)
 BP Readings from Last 3 Encounters:  10/28/24 128/83  10/23/24 138/89  10/21/24 128/88   Chronic, good control, good BP in the office today.  GFR and electrolytes okay on last chemistry panel.  Continue lisinopril  20 mg daily.

## 2024-10-28 NOTE — Patient Instructions (Signed)
 VISIT SUMMARY: During your visit, we discussed your rectal bleeding, swallowing difficulties, diabetes management, and a stable pulmonary nodule. We also performed an in-office retinal screening.  YOUR PLAN: RECTAL BLEEDING: You have been experiencing intermittent rectal bleeding and occasional dark stools. -Monitor for increased frequency of dark stools or significant weight loss. -No need for a gastroenterology referral unless symptoms worsen.  TYPE 2 DIABETES MELLITUS: Your diabetes is well-managed with no recent hypoglycemic episodes. -Refilled your insulin  prescription at [facility].  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 -Ordered a chest CT to reassess the pulmonary nodule. -Schedule the scan at a local radiology facility.  DYSPHAGIA: You have intermittent difficulty swallowing, primarily with rapid eating, but no significant weight loss or alarming symptoms. -Monitor for worsening dysphagia or significant weight loss. -No need for a gastroenterology referral unless symptoms worsen.  GENERAL HEALTH MAINTENANCE: We performed an in-office retinal screening as a substitute for the pending eye exam referral. -Canceled the ophthalmology referral unless the retinal screening indicates the need for further evaluation.  Take your prescription medications as usual on the day of your doctor visit. Unless specifically instructed, there is no need to fast prior to laboratory blood testing.  Bring all of the medications that you take (including over the counter medications and supplements) with you to every clinic visit.  This after visit summary is an important review of tests, referrals, and medication changes that were discussed during your visit. If you have questions or concerns, call [phone]. Outside of clinic business hours, call the main hospital at [phone] and ask the operator for the on-call internal medicine  resident.   Ozell Kung MD 10/28/2024, 9:58 AM

## 2024-10-29 NOTE — Progress Notes (Signed)
 Internal Medicine Clinic Attending  Case discussed with the resident at the time of the visit.  We reviewed the resident's history and exam and pertinent patient test results.  I agree with the assessment, diagnosis, and plan of care documented in the resident's note.

## 2024-11-03 ENCOUNTER — Other Ambulatory Visit (HOSPITAL_COMMUNITY): Payer: Self-pay

## 2024-11-06 ENCOUNTER — Encounter: Payer: Self-pay | Admitting: *Deleted

## 2024-11-06 ENCOUNTER — Other Ambulatory Visit: Payer: Self-pay

## 2024-11-06 DIAGNOSIS — Z006 Encounter for examination for normal comparison and control in clinical research program: Secondary | ICD-10-CM

## 2024-11-06 LAB — CBC WITH DIFFERENTIAL/PLATELET
Absolute Lymphocytes: 1904 {cells}/uL (ref 850–3900)
Absolute Monocytes: 317 {cells}/uL (ref 200–950)
Basophils Absolute: 61 {cells}/uL (ref 0–200)
Basophils Relative: 1.7 %
Eosinophils Absolute: 119 {cells}/uL (ref 15–500)
Eosinophils Relative: 3.3 %
HCT: 48.5 % (ref 39.4–51.1)
Hemoglobin: 15.7 g/dL (ref 13.2–17.1)
MCH: 29.6 pg (ref 27.0–33.0)
MCHC: 32.4 g/dL (ref 31.6–35.4)
MCV: 91.3 fL (ref 81.4–101.7)
MPV: 9.5 fL (ref 7.5–12.5)
Monocytes Relative: 8.8 %
Neutro Abs: 1199 {cells}/uL — ABNORMAL LOW (ref 1500–7800)
Neutrophils Relative %: 33.3 %
Platelets: 291 10*3/uL (ref 140–400)
RBC: 5.31 Million/uL (ref 4.20–5.80)
RDW: 14 % (ref 11.0–15.0)
Total Lymphocyte: 52.9 %
WBC: 3.6 10*3/uL — ABNORMAL LOW (ref 3.8–10.8)

## 2024-11-06 MED ORDER — STUDY - A5426 - DASATINIB 100 MG OR PLACEBO CAPSULE (PI-VAN DAM)
1.0000 | ORAL_CAPSULE | Freq: Once | ORAL | Status: AC
  Administered 2024-11-06: 100 mg via ORAL
  Filled 2024-11-06: qty 1

## 2024-11-06 MED ORDER — STUDY - A5426 - QUERCETIN 250 MG OR PLACEBO CAPSULE (PI-VAN DAM): 1250.0000 mg | ORAL_CAPSULE | ORAL | 0 refills | Status: AC

## 2024-11-06 MED ORDER — STUDY - A5426 - QUERCETIN 250 MG OR PLACEBO CAPSULE (PI-VAN DAM)
5.0000 | ORAL_CAPSULE | Freq: Once | ORAL | Status: AC
  Administered 2024-11-06: 1250 mg via ORAL
  Filled 2024-11-06: qty 5

## 2024-11-06 MED ORDER — STUDY - A5426 - DASATINIB 100 MG OR PLACEBO CAPSULE (PI-VAN DAM): 100.0000 mg | ORAL_CAPSULE | ORAL | 0 refills | Status: AC

## 2024-11-06 NOTE — Research (Signed)
 Wayne Wolfe here for his Week 4 visit for [phone] [facility]) study: Phase 2, multicenter, randomized, double-blinded, placebo-controlled trial to evaluate safety, tolerability and efficacy of dasatinib  and quercetin  together in improving physical function measurements in people with HIV who are frail or prefrail. All study procedures completed per protocol. Study IP dispensed. He will return in March for his next study visit.

## 2024-11-25 ENCOUNTER — Ambulatory Visit: Payer: Medicaid Other

## 2024-12-28 ENCOUNTER — Other Ambulatory Visit

## 2025-01-11 ENCOUNTER — Ambulatory Visit: Payer: Self-pay | Admitting: Infectious Disease
